# Patient Record
Sex: Male | Born: 1935 | Race: White | Hispanic: No | Smoking: Former smoker
Health system: Southern US, Community
[De-identification: ages and names within clinical notes are randomized; demographics above are authoritative.]

## PROBLEM LIST (undated history)

## (undated) DIAGNOSIS — I2510 Atherosclerotic heart disease of native coronary artery without angina pectoris: Secondary | ICD-10-CM

## (undated) DIAGNOSIS — N289 Disorder of kidney and ureter, unspecified: Secondary | ICD-10-CM

## (undated) DIAGNOSIS — G40909 Epilepsy, unspecified, not intractable, without status epilepticus: Secondary | ICD-10-CM

## (undated) DIAGNOSIS — K219 Gastro-esophageal reflux disease without esophagitis: Secondary | ICD-10-CM

## (undated) DIAGNOSIS — I219 Acute myocardial infarction, unspecified: Secondary | ICD-10-CM

## (undated) DIAGNOSIS — R55 Syncope and collapse: Secondary | ICD-10-CM

## (undated) DIAGNOSIS — I509 Heart failure, unspecified: Secondary | ICD-10-CM

## (undated) DIAGNOSIS — E785 Hyperlipidemia, unspecified: Secondary | ICD-10-CM

## (undated) DIAGNOSIS — J45909 Unspecified asthma, uncomplicated: Secondary | ICD-10-CM

## (undated) DIAGNOSIS — I714 Abdominal aortic aneurysm, without rupture, unspecified: Secondary | ICD-10-CM

## (undated) DIAGNOSIS — I10 Essential (primary) hypertension: Secondary | ICD-10-CM

## (undated) DIAGNOSIS — I495 Sick sinus syndrome: Secondary | ICD-10-CM

## (undated) DIAGNOSIS — I639 Cerebral infarction, unspecified: Secondary | ICD-10-CM

## (undated) DIAGNOSIS — R569 Unspecified convulsions: Secondary | ICD-10-CM

## (undated) DIAGNOSIS — C801 Malignant (primary) neoplasm, unspecified: Secondary | ICD-10-CM

## (undated) DIAGNOSIS — N184 Chronic kidney disease, stage 4 (severe): Secondary | ICD-10-CM

## (undated) DIAGNOSIS — J189 Pneumonia, unspecified organism: Secondary | ICD-10-CM

## (undated) HISTORY — DX: Syncope and collapse: R55

## (undated) HISTORY — PX: EYE SURGERY: SHX253

## (undated) HISTORY — PX: COLONOSCOPY: SHX174

## (undated) HISTORY — DX: Essential (primary) hypertension: I10

## (undated) HISTORY — DX: Hyperlipidemia, unspecified: E78.5

## (undated) HISTORY — DX: Epilepsy, unspecified, not intractable, without status epilepticus: G40.909

## (undated) HISTORY — DX: Atherosclerotic heart disease of native coronary artery without angina pectoris: I25.10

## (undated) HISTORY — DX: Abdominal aortic aneurysm, without rupture: I71.4

## (undated) HISTORY — PX: NOSE SURGERY: SHX723

## (undated) HISTORY — DX: Cerebral infarction, unspecified: I63.9

## (undated) HISTORY — DX: Abdominal aortic aneurysm, without rupture, unspecified: I71.40

---

## 2001-11-03 ENCOUNTER — Ambulatory Visit (HOSPITAL_COMMUNITY): Admission: RE | Admit: 2001-11-03 | Discharge: 2001-11-03 | Payer: Self-pay | Admitting: Internal Medicine

## 2002-05-11 ENCOUNTER — Ambulatory Visit (HOSPITAL_COMMUNITY): Admission: RE | Admit: 2002-05-11 | Discharge: 2002-05-11 | Payer: Self-pay | Admitting: Internal Medicine

## 2002-05-11 ENCOUNTER — Encounter (INDEPENDENT_AMBULATORY_CARE_PROVIDER_SITE_OTHER): Payer: Self-pay | Admitting: Internal Medicine

## 2002-06-03 ENCOUNTER — Ambulatory Visit (HOSPITAL_COMMUNITY): Admission: RE | Admit: 2002-06-03 | Discharge: 2002-06-03 | Payer: Self-pay | Admitting: Internal Medicine

## 2003-01-07 ENCOUNTER — Ambulatory Visit (HOSPITAL_COMMUNITY): Admission: RE | Admit: 2003-01-07 | Discharge: 2003-01-07 | Payer: Self-pay | Admitting: Internal Medicine

## 2003-03-16 ENCOUNTER — Ambulatory Visit (HOSPITAL_COMMUNITY): Admission: RE | Admit: 2003-03-16 | Discharge: 2003-03-16 | Payer: Self-pay | Admitting: Internal Medicine

## 2003-10-12 ENCOUNTER — Ambulatory Visit (HOSPITAL_COMMUNITY): Admission: RE | Admit: 2003-10-12 | Discharge: 2003-10-12 | Payer: Self-pay | Admitting: Internal Medicine

## 2003-10-25 ENCOUNTER — Encounter (HOSPITAL_COMMUNITY): Admission: RE | Admit: 2003-10-25 | Discharge: 2003-10-25 | Payer: Self-pay | Admitting: Internal Medicine

## 2003-11-04 ENCOUNTER — Ambulatory Visit (HOSPITAL_COMMUNITY): Admission: RE | Admit: 2003-11-04 | Discharge: 2003-11-04 | Payer: Self-pay | Admitting: Internal Medicine

## 2003-11-18 ENCOUNTER — Ambulatory Visit: Payer: Self-pay | Admitting: Internal Medicine

## 2004-04-21 ENCOUNTER — Ambulatory Visit (HOSPITAL_COMMUNITY): Admission: RE | Admit: 2004-04-21 | Discharge: 2004-04-21 | Payer: Self-pay | Admitting: Internal Medicine

## 2004-08-04 ENCOUNTER — Ambulatory Visit (HOSPITAL_COMMUNITY): Admission: RE | Admit: 2004-08-04 | Discharge: 2004-08-04 | Payer: Self-pay | Admitting: Internal Medicine

## 2004-08-09 ENCOUNTER — Encounter (HOSPITAL_COMMUNITY): Admission: RE | Admit: 2004-08-09 | Discharge: 2004-08-10 | Payer: Self-pay | Admitting: Internal Medicine

## 2004-08-14 ENCOUNTER — Ambulatory Visit: Payer: Self-pay | Admitting: *Deleted

## 2004-08-17 ENCOUNTER — Inpatient Hospital Stay (HOSPITAL_BASED_OUTPATIENT_CLINIC_OR_DEPARTMENT_OTHER): Admission: RE | Admit: 2004-08-17 | Discharge: 2004-08-17 | Payer: Self-pay | Admitting: *Deleted

## 2004-08-17 ENCOUNTER — Ambulatory Visit: Payer: Self-pay | Admitting: *Deleted

## 2004-08-23 ENCOUNTER — Inpatient Hospital Stay (HOSPITAL_COMMUNITY): Admission: RE | Admit: 2004-08-23 | Discharge: 2004-08-25 | Payer: Self-pay | Admitting: Cardiology

## 2004-08-23 HISTORY — PX: CARDIAC CATHETERIZATION: SHX172

## 2004-09-07 ENCOUNTER — Ambulatory Visit: Payer: Self-pay | Admitting: *Deleted

## 2004-09-07 ENCOUNTER — Ambulatory Visit: Payer: Self-pay | Admitting: Cardiology

## 2004-09-07 ENCOUNTER — Ambulatory Visit (HOSPITAL_COMMUNITY): Admission: RE | Admit: 2004-09-07 | Discharge: 2004-09-07 | Payer: Self-pay | Admitting: *Deleted

## 2005-03-19 ENCOUNTER — Ambulatory Visit (HOSPITAL_COMMUNITY): Admission: RE | Admit: 2005-03-19 | Discharge: 2005-03-19 | Payer: Self-pay | Admitting: Ophthalmology

## 2005-04-24 ENCOUNTER — Ambulatory Visit (HOSPITAL_COMMUNITY): Admission: RE | Admit: 2005-04-24 | Discharge: 2005-04-24 | Payer: Self-pay | Admitting: Internal Medicine

## 2005-08-23 ENCOUNTER — Ambulatory Visit (HOSPITAL_COMMUNITY): Admission: RE | Admit: 2005-08-23 | Discharge: 2005-08-23 | Payer: Self-pay | Admitting: Internal Medicine

## 2005-10-26 ENCOUNTER — Ambulatory Visit: Payer: Self-pay | Admitting: Cardiovascular Disease

## 2005-10-30 ENCOUNTER — Ambulatory Visit: Payer: Self-pay | Admitting: Cardiology

## 2005-10-30 ENCOUNTER — Encounter (HOSPITAL_COMMUNITY): Admission: RE | Admit: 2005-10-30 | Discharge: 2005-11-29 | Payer: Self-pay | Admitting: Cardiovascular Disease

## 2006-01-23 ENCOUNTER — Ambulatory Visit: Payer: Self-pay | Admitting: Cardiovascular Disease

## 2006-03-20 ENCOUNTER — Ambulatory Visit: Payer: Self-pay | Admitting: Internal Medicine

## 2006-03-26 ENCOUNTER — Ambulatory Visit: Payer: Self-pay | Admitting: Internal Medicine

## 2006-04-23 ENCOUNTER — Inpatient Hospital Stay (HOSPITAL_COMMUNITY): Admission: EM | Admit: 2006-04-23 | Discharge: 2006-04-25 | Payer: Self-pay | Admitting: Emergency Medicine

## 2006-05-16 ENCOUNTER — Ambulatory Visit: Payer: Self-pay | Admitting: Internal Medicine

## 2006-05-21 ENCOUNTER — Ambulatory Visit (HOSPITAL_COMMUNITY): Admission: RE | Admit: 2006-05-21 | Discharge: 2006-05-21 | Payer: Self-pay | Admitting: Internal Medicine

## 2006-08-21 ENCOUNTER — Ambulatory Visit (HOSPITAL_COMMUNITY): Admission: RE | Admit: 2006-08-21 | Discharge: 2006-08-21 | Payer: Self-pay | Admitting: Internal Medicine

## 2006-09-03 ENCOUNTER — Ambulatory Visit: Payer: Self-pay | Admitting: Cardiovascular Disease

## 2006-09-04 ENCOUNTER — Ambulatory Visit: Payer: Self-pay | Admitting: Orthopedic Surgery

## 2006-10-22 ENCOUNTER — Ambulatory Visit: Payer: Self-pay | Admitting: Cardiovascular Disease

## 2006-10-22 ENCOUNTER — Encounter (HOSPITAL_COMMUNITY): Admission: RE | Admit: 2006-10-22 | Discharge: 2006-11-21 | Payer: Self-pay | Admitting: Cardiovascular Disease

## 2006-11-01 ENCOUNTER — Ambulatory Visit: Payer: Self-pay | Admitting: Vascular Surgery

## 2007-04-17 ENCOUNTER — Ambulatory Visit: Payer: Self-pay | Admitting: Cardiovascular Disease

## 2007-07-25 ENCOUNTER — Ambulatory Visit (HOSPITAL_COMMUNITY): Admission: RE | Admit: 2007-07-25 | Discharge: 2007-07-25 | Payer: Self-pay | Admitting: Internal Medicine

## 2007-08-07 ENCOUNTER — Ambulatory Visit (HOSPITAL_COMMUNITY): Admission: RE | Admit: 2007-08-07 | Discharge: 2007-08-07 | Payer: Self-pay | Admitting: Internal Medicine

## 2007-08-12 ENCOUNTER — Ambulatory Visit (HOSPITAL_COMMUNITY): Admission: RE | Admit: 2007-08-12 | Discharge: 2007-08-12 | Payer: Self-pay | Admitting: Internal Medicine

## 2007-08-17 ENCOUNTER — Emergency Department (HOSPITAL_COMMUNITY): Admission: EM | Admit: 2007-08-17 | Discharge: 2007-08-17 | Payer: Self-pay | Admitting: Emergency Medicine

## 2007-08-19 ENCOUNTER — Ambulatory Visit: Payer: Self-pay | Admitting: Cardiology

## 2007-09-17 ENCOUNTER — Ambulatory Visit: Payer: Self-pay | Admitting: Cardiology

## 2007-10-24 ENCOUNTER — Ambulatory Visit: Payer: Self-pay | Admitting: Vascular Surgery

## 2008-03-03 ENCOUNTER — Ambulatory Visit (HOSPITAL_COMMUNITY): Admission: RE | Admit: 2008-03-03 | Discharge: 2008-03-03 | Payer: Self-pay | Admitting: Internal Medicine

## 2008-06-11 ENCOUNTER — Encounter: Payer: Self-pay | Admitting: Physician Assistant

## 2008-06-15 ENCOUNTER — Encounter: Payer: Self-pay | Admitting: Physician Assistant

## 2008-07-15 ENCOUNTER — Encounter: Payer: Self-pay | Admitting: Cardiology

## 2008-07-15 ENCOUNTER — Ambulatory Visit: Payer: Self-pay | Admitting: Cardiology

## 2008-10-22 ENCOUNTER — Ambulatory Visit: Payer: Self-pay | Admitting: Vascular Surgery

## 2008-10-29 ENCOUNTER — Encounter: Admission: RE | Admit: 2008-10-29 | Discharge: 2008-10-29 | Payer: Self-pay | Admitting: Neurology

## 2008-11-23 ENCOUNTER — Ambulatory Visit (HOSPITAL_COMMUNITY): Admission: RE | Admit: 2008-11-23 | Discharge: 2008-11-23 | Payer: Self-pay | Admitting: Internal Medicine

## 2008-11-30 ENCOUNTER — Encounter: Payer: Self-pay | Admitting: Critical Care Medicine

## 2008-11-30 ENCOUNTER — Ambulatory Visit (HOSPITAL_COMMUNITY): Admission: RE | Admit: 2008-11-30 | Discharge: 2008-11-30 | Payer: Self-pay | Admitting: Internal Medicine

## 2009-01-06 ENCOUNTER — Ambulatory Visit: Payer: Self-pay | Admitting: Critical Care Medicine

## 2009-01-06 DIAGNOSIS — I6350 Cerebral infarction due to unspecified occlusion or stenosis of unspecified cerebral artery: Secondary | ICD-10-CM | POA: Insufficient documentation

## 2009-01-06 DIAGNOSIS — J449 Chronic obstructive pulmonary disease, unspecified: Secondary | ICD-10-CM

## 2009-01-06 DIAGNOSIS — E785 Hyperlipidemia, unspecified: Secondary | ICD-10-CM

## 2009-01-06 DIAGNOSIS — J4489 Other specified chronic obstructive pulmonary disease: Secondary | ICD-10-CM | POA: Insufficient documentation

## 2009-01-06 DIAGNOSIS — M109 Gout, unspecified: Secondary | ICD-10-CM

## 2009-01-06 DIAGNOSIS — K219 Gastro-esophageal reflux disease without esophagitis: Secondary | ICD-10-CM

## 2009-01-06 DIAGNOSIS — I10 Essential (primary) hypertension: Secondary | ICD-10-CM | POA: Insufficient documentation

## 2009-01-06 DIAGNOSIS — J438 Other emphysema: Secondary | ICD-10-CM | POA: Insufficient documentation

## 2009-01-10 ENCOUNTER — Ambulatory Visit: Payer: Self-pay | Admitting: Cardiology

## 2009-01-10 ENCOUNTER — Encounter (INDEPENDENT_AMBULATORY_CARE_PROVIDER_SITE_OTHER): Payer: Self-pay

## 2009-01-10 DIAGNOSIS — I2510 Atherosclerotic heart disease of native coronary artery without angina pectoris: Secondary | ICD-10-CM

## 2009-01-10 DIAGNOSIS — R079 Chest pain, unspecified: Secondary | ICD-10-CM

## 2009-01-10 LAB — CONVERTED CEMR LAB
Albumin: 4.2 g/dL
BUN: 22 mg/dL
CO2: 27 meq/L
Cholesterol: 164 mg/dL
Glucose, Bld: 128 mg/dL
HCT: 41.6 %
HDL: 38 mg/dL
Hemoglobin: 13.7 g/dL
MCV: 96.7 fL
Platelets: 191 10*3/uL
Potassium: 4.8 meq/L
Sodium: 141 meq/L
Total Protein: 6.7 g/dL
Triglycerides: 125 mg/dL

## 2009-01-12 ENCOUNTER — Inpatient Hospital Stay (HOSPITAL_BASED_OUTPATIENT_CLINIC_OR_DEPARTMENT_OTHER): Admission: RE | Admit: 2009-01-12 | Discharge: 2009-01-12 | Payer: Self-pay | Admitting: Cardiology

## 2009-01-12 ENCOUNTER — Ambulatory Visit: Payer: Self-pay | Admitting: Cardiology

## 2009-01-17 ENCOUNTER — Encounter (INDEPENDENT_AMBULATORY_CARE_PROVIDER_SITE_OTHER): Payer: Self-pay | Admitting: *Deleted

## 2009-01-17 ENCOUNTER — Encounter: Payer: Self-pay | Admitting: Cardiology

## 2009-01-17 ENCOUNTER — Telehealth (INDEPENDENT_AMBULATORY_CARE_PROVIDER_SITE_OTHER): Payer: Self-pay | Admitting: *Deleted

## 2009-01-17 LAB — CONVERTED CEMR LAB
BUN: 18 mg/dL
BUN: 18 mg/dL
CO2: 22 meq/L
Chloride: 108 meq/L
Creatinine, Ser: 1.77 mg/dL
Glucose, Bld: 106 mg/dL
Glucose, Bld: 106 mg/dL
Potassium: 4.6 meq/L
Potassium: 4.6 meq/L
Sodium: 144 meq/L

## 2009-01-18 ENCOUNTER — Telehealth (INDEPENDENT_AMBULATORY_CARE_PROVIDER_SITE_OTHER): Payer: Self-pay

## 2009-01-18 ENCOUNTER — Encounter: Payer: Self-pay | Admitting: Cardiology

## 2009-01-18 LAB — CONVERTED CEMR LAB
CO2: 22 meq/L (ref 19–32)
Chloride: 108 meq/L (ref 96–112)
Potassium: 4.6 meq/L (ref 3.5–5.3)
Sodium: 144 meq/L (ref 135–145)

## 2009-01-21 ENCOUNTER — Encounter: Payer: Self-pay | Admitting: *Deleted

## 2009-01-24 ENCOUNTER — Encounter: Payer: Self-pay | Admitting: Adult Health

## 2009-01-24 ENCOUNTER — Ambulatory Visit: Payer: Self-pay | Admitting: Cardiology

## 2009-01-26 ENCOUNTER — Encounter: Payer: Self-pay | Admitting: Cardiology

## 2009-01-26 ENCOUNTER — Ambulatory Visit (HOSPITAL_COMMUNITY): Admission: RE | Admit: 2009-01-26 | Discharge: 2009-01-26 | Payer: Self-pay | Admitting: Cardiology

## 2009-01-26 ENCOUNTER — Ambulatory Visit: Payer: Self-pay | Admitting: Cardiology

## 2009-02-01 ENCOUNTER — Encounter (INDEPENDENT_AMBULATORY_CARE_PROVIDER_SITE_OTHER): Payer: Self-pay | Admitting: *Deleted

## 2009-02-01 LAB — CONVERTED CEMR LAB
BUN: 18 mg/dL
Chloride: 108 meq/L
Creatinine, Ser: 1.78 mg/dL

## 2009-02-02 ENCOUNTER — Encounter (INDEPENDENT_AMBULATORY_CARE_PROVIDER_SITE_OTHER): Payer: Self-pay | Admitting: *Deleted

## 2009-02-09 ENCOUNTER — Encounter (INDEPENDENT_AMBULATORY_CARE_PROVIDER_SITE_OTHER): Payer: Self-pay | Admitting: *Deleted

## 2009-02-09 ENCOUNTER — Ambulatory Visit: Payer: Self-pay | Admitting: Critical Care Medicine

## 2009-02-09 DIAGNOSIS — J310 Chronic rhinitis: Secondary | ICD-10-CM | POA: Insufficient documentation

## 2009-02-14 ENCOUNTER — Telehealth: Payer: Self-pay | Admitting: Critical Care Medicine

## 2009-02-23 ENCOUNTER — Ambulatory Visit: Payer: Self-pay | Admitting: Cardiology

## 2009-02-23 ENCOUNTER — Encounter (INDEPENDENT_AMBULATORY_CARE_PROVIDER_SITE_OTHER): Payer: Self-pay | Admitting: *Deleted

## 2009-03-18 ENCOUNTER — Telehealth: Payer: Self-pay | Admitting: Critical Care Medicine

## 2009-04-28 ENCOUNTER — Ambulatory Visit (HOSPITAL_COMMUNITY): Admission: RE | Admit: 2009-04-28 | Discharge: 2009-04-28 | Payer: Self-pay | Admitting: Internal Medicine

## 2009-06-10 ENCOUNTER — Ambulatory Visit: Payer: Self-pay | Admitting: Critical Care Medicine

## 2009-08-29 ENCOUNTER — Ambulatory Visit: Payer: Self-pay | Admitting: Cardiology

## 2009-08-29 ENCOUNTER — Telehealth (INDEPENDENT_AMBULATORY_CARE_PROVIDER_SITE_OTHER): Payer: Self-pay

## 2009-10-18 ENCOUNTER — Emergency Department (HOSPITAL_COMMUNITY): Admission: EM | Admit: 2009-10-18 | Discharge: 2009-10-18 | Payer: Self-pay | Admitting: Emergency Medicine

## 2009-12-12 ENCOUNTER — Ambulatory Visit: Payer: Self-pay | Admitting: Critical Care Medicine

## 2010-01-24 ENCOUNTER — Ambulatory Visit: Admit: 2010-01-24 | Payer: Self-pay | Admitting: Vascular Surgery

## 2010-01-24 ENCOUNTER — Ambulatory Visit
Admission: RE | Admit: 2010-01-24 | Discharge: 2010-01-24 | Payer: Self-pay | Source: Home / Self Care | Attending: Vascular Surgery | Admitting: Vascular Surgery

## 2010-02-05 ENCOUNTER — Encounter: Payer: Self-pay | Admitting: Neurology

## 2010-02-12 LAB — CONVERTED CEMR LAB
Calcium: 9.8 mg/dL (ref 8.4–10.5)
Hemoglobin: 12.9 g/dL — ABNORMAL LOW (ref 13.0–17.0)
Lymphs Abs: 1.2 10*3/uL (ref 0.7–4.0)
MCV: 92.4 fL (ref 78.0–100.0)
Monocytes Absolute: 0.8 10*3/uL (ref 0.1–1.0)
Monocytes Relative: 11 % (ref 3–12)
Neutro Abs: 5.3 10*3/uL (ref 1.7–7.7)
Neutrophils Relative %: 71 % (ref 43–77)
Prothrombin Time: 12.3 s (ref 11.6–15.2)
RBC: 4.28 M/uL (ref 4.22–5.81)
Sodium: 139 meq/L (ref 135–145)
WBC: 7.5 10*3/uL (ref 4.0–10.5)
aPTT: 29 s (ref 24–37)

## 2010-02-14 NOTE — Miscellaneous (Signed)
Summary: LABS BMP,.01/17/2009  Clinical Lists Changes  Observations: Added new observation of CALCIUM: 8.4 mg/dL (28/41/3244 0:10) Added new observation of CREATININE: 1.77 mg/dL (27/25/3664 4:03) Added new observation of BUN: 18 mg/dL (47/42/5956 3:87) Added new observation of BG RANDOM: 106 mg/dL (56/43/3295 1:88) Added new observation of CO2 PLSM/SER: 22 meq/L (01/17/2009 9:53) Added new observation of CL SERUM: 108 meq/L (01/17/2009 9:53) Added new observation of K SERUM: 4.6 meq/L (01/17/2009 9:53) Added new observation of NA: 144 meq/L (01/17/2009 9:53)

## 2010-02-14 NOTE — Progress Notes (Signed)
Summary: rx  Phone Note Call from Patient Call back at Home Phone [phone]   Caller: Patient Call For: wright Reason for Call: Talk to Nurse Summary of Call: pt says nasacort not as good as nasonex.  Pt still having bad dreams so wants to go back to Nasonex Spray.   Can you call this in? [facility] [place] Initial call taken by: Eugene Gavia,  March 18, 2009 10:41 AM  Follow-up for Phone Call        Pt had requested to change from nasonex due to bad dreams and restlessness that he states started after he changed to nasacort Pt states however that nasacort doe snot work as well as nasonex and is requesting to go back to Nasonex. Please advise if ok to swithc back. Carron Curie CMA  March 18, 2009 11:21 AM   Additional Follow-up for Phone Call Additional follow up Details #1::        i am ok wiht nasonex two sprays each nostril daily Additional Follow-up by: Storm Frisk MD,  March 18, 2009 1:30 PM    Additional Follow-up for Phone Call Additional follow up Details #2::    pt advised. rx sent. Carron Curie CMA  March 18, 2009 3:20 PM'   New/Updated Medications: NASONEX 50 MCG/ACT SUSP (MOMETASONE FUROATE) 2 sprays each nostril daily Prescriptions: NASONEX 50 MCG/ACT SUSP (MOMETASONE FUROATE) 2 sprays each nostril daily  #1 x 6   Entered by:   Carron Curie CMA   Authorized by:   Storm Frisk MD   Signed by:   Carron Curie CMA on 03/18/2009   Method used:   Electronically to        [facility] (retail)       [address]       [place], [place]  [address]       Ph: [phone]       Fax: [phone]   RxID:   [id number]

## 2010-02-14 NOTE — Miscellaneous (Signed)
Summary: LABS 01/17/09 BMP  Clinical Lists Changes  Observations: Added new observation of CALCIUM: 8.4 mg/dL (40/10/2723 36:64) Added new observation of CREATININE: 1.77 mg/dL (40/34/7425 95:63) Added new observation of BUN: 18 mg/dL (87/56/4332 95:18) Added new observation of BG RANDOM: 106 mg/dL (84/16/6063 01:60) Added new observation of CO2 PLSM/SER: 22 meq/L (01/17/2009 12:29) Added new observation of CL SERUM: 108 meq/L (01/17/2009 12:29) Added new observation of K SERUM: 4.6 meq/L (01/17/2009 12:29) Added new observation of NA: 144 meq/L (01/17/2009 12:29)

## 2010-02-14 NOTE — Progress Notes (Signed)
Summary: pt want to go over meds   Phone Note Call from Patient Call back at Home Phone [phone]   Caller: pt walk in Reason for Call: Talk to Nurse Summary of Call: pt was put on metoprolol by Dr Daleen Squibb and isosorbide by Joni Reining he just want to make sure this is correct to be taking both of them. Initial call taken by: Faythe Ghee,  August 29, 2009 11:33 AM  Follow-up for Phone Call        Pt. advised that it is OK to take both medications. Follow-up by: Larita Fife Via LPN,  August 29, 2009 2:42 PM

## 2010-02-14 NOTE — Letter (Signed)
Summary: [place] Results [facility], agricultural at [facility]  [address], [place] [address]   Phone: [phone]  Fax: [phone]      February 02, 2009 MRN: [id number]   [name] [address] [place], [place]  [address]   Dear [name],  Your test ordered by Selena Batten has been reviewed by your physician (or physician assistant) and was found to be normal or stable. Your physician (or physician assistant) felt no changes were needed at this time.  ____ Echocardiogram  ____ Cardiac Stress Test  __x__ Lab Work  ____ Peripheral vascular study of arms, legs or neck  ____ CT scan or X-ray  ____ Lung or Breathing test  ____ Other:  No change in medical treatment at this time,per  Dr. Diona Browner creatinine is stable.  Thank you, [name] RN    [place] Bing, MD, Lenise Arena.C.Gaylord Shih, MD, F.A.C.C Lewayne Bunting, MD, F.A.C.C Nona Dell, MD, F.A.C.C Charlton Haws, MD, Lenise Arena.C.C

## 2010-02-14 NOTE — Letter (Signed)
Summary: [place] [facility] Work Engineer, agricultural at [facility]  [address] [place] [address]   Phone: [phone]  Fax: [phone]     January 18, 2009 MRN: [id number]   [name] [address] [place], [place]  [address]      YOUR LAB WORK IS DUE  February 01, 2009 _________________________________________  Please go to [facility], located across the street from [facility] on the second floor.  Hours are Monday - Friday 7am until 7:30pm         Saturday 8am until 12noon    __  DO NOT EAT OR DRINK AFTER MIDNIGHT EVENING PRIOR TO LABWORK  _X_ YOUR LABWORK IS NOT FASTING --YOU MAY EAT PRIOR TO LABWORK

## 2010-02-14 NOTE — Progress Notes (Signed)
Summary: lab orders   Phone Note Other Incoming   Caller: [facility] Reason for Call: Discuss lab or test results Summary of Call: pt was seen this morning but they do not have order just went off pt cath discharge instructions. Please fax order. Initial call taken by: Faythe Ghee,  January 17, 2009 12:06 PM  Follow-up for Phone Call        orders sent from cath lab at discharge , pt found orders, no dx, I stated renal insufficency per last ov Follow-up by: Teressa Lower RN,  January 17, 2009 3:17 PM

## 2010-02-14 NOTE — Progress Notes (Signed)
Summary: reaction  Phone Note Call from Patient   Caller: Patient Call For: wright Summary of Call: nasonex is causing reaction would like somethingelse Initial call taken by: Rickard Patience,  February 14, 2009 11:02 AM  Follow-up for Phone Call        pt was started on nasonex at last ov on 02/09/09.  called, spoke with pt.  Pt states he is having crazy dreams, is restless, and is hot.  states these sxs did not start until he started nasonex-thinks he is having a reaction from it.  requesting nasonex to be changed.   Informed pt PW is out of office until tomorrow but will talk with doc of day about this-he is ok with this. Will forward message to doc of day-MW, please advise.  Thanks!  Follow-up by: Gweneth Dimitri RN,  February 14, 2009 11:13 AM  Additional Follow-up for Phone Call Additional follow up Details #1::        ok to change to nasacort, though I strongly doubt cause and effect Additional Follow-up by: Nyoka Cowden MD,  February 14, 2009 11:25 AM    Additional Follow-up for Phone Call Additional follow up Details #2::    pt advised. rx sent. Carron Curie CMA  February 14, 2009 11:34 AM   New/Updated Medications: NASACORT AQ 55 MCG/ACT AERS (TRIAMCINOLONE ACETONIDE(NASAL)) 2 puffs in each nostril daily Prescriptions: NASACORT AQ 55 MCG/ACT AERS (TRIAMCINOLONE ACETONIDE(NASAL)) 2 puffs in each nostril daily  #1 x 3   Entered by:   Carron Curie CMA   Authorized by:   Nyoka Cowden MD   Signed by:   Carron Curie CMA on 02/14/2009   Method used:   Electronically to        [facility] (retail)       [address]       [place], [place]  [address]       Ph: [phone]       Fax: [phone]   RxID:   [id number]   Appended Document: reaction i agree  pw

## 2010-02-14 NOTE — Assessment & Plan Note (Signed)
Summary: 2 wk f/u per checkuot on 01/24/09/tg      Allergies Added:   Visit Type:  Follow-up Referring Provider:  Dr. Shan Levans Primary Provider:  Dr. Carylon Perches   History of Present Illness: [name] is an anxious 77 y/o CM on follow-up for known history of CAD, Sever COPD, with most recent cardiac cath on 01/12/2009.  [name] had cardiac cath by Dr. Shirlee Latch revealing RCA totally occluded at the ostium with some left-right collaterals.  LM had 30% distal , Left CX revealed a small to moderate ramus, with about 60=70% ostial stenosis.  The circumflex itself had a long total occlusion and it extended from the ostium to just prior to the first OM.  This was the site of the prior placed stent..  The patient had good collateralls from the LAD to the distal Circ and 3 small to moderate OM field.  LAD had a 50%-^60% proximal stenosis just after the 1st diag.  The remainder of the LAD had luminal irriegularities. No LV gram was completed secondary to CKD.   The LAD stenosis was not flow limiting per Dr. Alford Highland observation.  Tx Medically.  On last visit he was continuing to have DOE and we ordered an echo as LV gram was not completed during above cath.  Also, his Imdur was incrased to 60mg  XL daily.  He states he is feeling better and has had an appointment with Dr. Sherene Sires pulmonoloigist for his dyspnea/cOPD.  He was started on Nasocort. He is here for discussion of echo results and follow-up evaluation of symptoms.  Echo revealed normal EF of 65%.  Mildly-Moderately  calcified AoV annulus with a minimal pericardial effusion identified.  No WMA, other valve were not well visulalized.  Please see full report for more details.  [name] states his is feeling some better and has been encouraged to begin exercise program with walking as he was doing in the past.    Preventive Screening-Counseling & Management  Alcohol-Tobacco     Alcohol drinks/day: 0     Smoking Status: quit  Current Medications  (verified): 1)  Simvastatin 40 Mg Tabs (Simvastatin) .... Take One Tablet By Mouth Daily At Bedtime 2)  Aspirin Ec 325 Mg Tbec (Aspirin) .... Take One Tablet By Mouth Daily 3)  Allopurinol 300 Mg Tabs (Allopurinol) .... Take 1 Tablet By Mouth Once A Day 4)  Cozaar 100 Mg Tabs (Losartan Potassium) .... Take 1 Tablet By Mouth Once Daily 5)  Plavix 75 Mg Tabs (Clopidogrel Bisulfate) .... Take One Tablet By Mouth Daily 6)  Oxybutynin Chloride 5 Mg Tabs (Oxybutynin Chloride) .... Take 1 Tablet By Mouth Two Times A Day 7)  Symbicort 160-4.5 Mcg/act  Aero (Budesonide-Formoterol Fumarate) .... Two Puffs Twice Daily 8)  Pepcid 20 Mg Tabs (Famotidine) .... One By Mouth Two Times A Day 9)  Isosorbide Mononitrate Cr 60 Mg Xr24h-Tab (Isosorbide Mononitrate) .... Take 1 Tablet By Mouth Once Daily 10)  Nitrostat 0.4 Mg Subl (Nitroglycerin) .Marland Kitchen.. 1 Tablet Under Tongue At Onset of Chest Pain; You May Repeat Every 5 Minutes For Up To 3 Doses. 11)  Nasacort Aq 55 Mcg/act Aers (Triamcinolone Acetonide(Nasal)) .... 2 Puffs in Each Nostril Daily  Allergies (verified): 1)  ! Penicillin V Potassium (Penicillin V Potassium) PMH-FH-SH reviewed-no changes except otherwise noted  Social History: Alcohol drinks/day:  0 Smoking Status:  quit  Review of Systems       The patient complains of chest pain and dyspnea on exertion.  All other systems have been reviewed and are negative unless stated above.   Vital Signs:  Patient profile:   77 year old male Weight:      186 pounds Pulse rate:   85 / minute BP sitting:   148 / 85  (right arm)  Vitals Entered By: Dreama Saa, CNA (February 23, 2009 11:13 AM)  Physical Exam  General:  Well developed, well nourished, in no acute distress. Lungs:  Diminshed with increased expiratory phase. Heart:  Non-displaced PMI, chest non-tender; regular rate and rhythm, S1, S2  1/6 systolic murmur, rubs or gallops.  Abdomen:  Bowel sounds positive; abdomen soft and  non-tender without masses, organomegaly, or hernias noted. No hepatosplenomegaly. Msk:  Back normal, normal gait. Muscle strength and tone normal. Psych:  anxious.     Impression & Recommendations:  Problem # 1:  CORONARY ATHEROSCLEROSIS NATIVE CORONARY ARTERY (ICD-414.01) Assessment Unchanged Continue to treat medically.  He is tolerating increased dose of Imdur at 60mg . His updated medication list for this problem includes:    Aspirin Ec 325 Mg Tbec (Aspirin) .Marland Kitchen... Take one tablet by mouth daily    Plavix 75 Mg Tabs (Clopidogrel bisulfate) .Marland Kitchen... Take one tablet by mouth daily    Isosorbide Mononitrate Cr 60 Mg Xr24h-tab (Isosorbide mononitrate) .Marland Kitchen... Take 1 tablet by mouth once daily    Nitrostat 0.4 Mg Subl (Nitroglycerin) .Marland Kitchen... 1 tablet under tongue at onset of chest pain; you may repeat every 5 minutes for up to 3 doses.  Problem # 2:  C O P D (ICD-496) Assessment: Unchanged  His updated medication list for this problem includes:    Symbicort 160-4.5 Mcg/act Aero (Budesonide-formoterol fumarate) .Marland Kitchen..Marland Kitchen Two puffs twice daily  Problem # 3:  HYPERTENSION (ICD-401.9) Assessment: Unchanged  His updated medication list for this problem includes:    Aspirin Ec 325 Mg Tbec (Aspirin) .Marland Kitchen... Take one tablet by mouth daily    Cozaar 100 Mg Tabs (Losartan potassium) .Marland Kitchen... Take 1 tablet by mouth once daily  Patient Instructions: 1)  Your physician recommends that you schedule a follow-up appointment in: 5 months 2)  Your physician recommends that you continue on your current medications as directed. Please refer to the Current Medication list given to you today.

## 2010-02-14 NOTE — Assessment & Plan Note (Signed)
Summary: 1 yr f/u per checkout on 07/15/08/tg  Medications Added METOPROLOL TARTRATE 25 MG TABS (METOPROLOL TARTRATE) take 1 tablet by mouth once daily      Allergies Added:   Visit Type:  Follow-up Referring Provider:  Dr. Shan Levans Primary Provider:  Dr. Carylon Perches  CC:  some chest pain .  History of Present Illness: [name] returns today for evaluation and management of his coronary disease. Please see the comprehensive note by Ms. [name] dated February 23, 2009.  He is having no angina. He had one episode of a sharp stabbing pain since last being seen. He did not take any nitroglycerin.  He denies orthopnea, PND. He does have some dependent edema at the end of the day particularly in the left lower extremity. He's had no pleuritic chest pain or soreness in his legs.  He's compliant with his medications.  Clinical Reports Reviewed:  Cardiac Cath:  08/23/2004: Cardiac Cath Findings:   COMPLICATIONS:  None.   FINDINGS:  1.  Right coronary artery moderate sized dominant vessel which is totally      occluded proximally, there are bridging collaterals.  Length of the      total occlusion is approximately 15 mm.  2.  Circumflex 99% stenosis reduced to less than 20%.  Flow improved from      TIMI-2 to TIMI-3.   IMPRESSION PLAN:  Successful bare metal stenting of the proximal circumflex.  There remains a non-flow limiting dissection distal to the stented region.  A stent could not be advanced across this segment.  The patient will,  therefore, be continued on aspirin indefinitely and Plavix for 90 days.  ReoPro was given as bail-out at the completion of the procedure.  He will be  observed for 48 hours prior to discharge.   WED/MEDQ  D:  08/23/2004  T:  08/23/2004  Job:  [id number]   cc:   [name], M.D.  Fax: [phone]   Current Medications (verified): 1)  Simvastatin 40 Mg Tabs (Simvastatin) .... Take One Tablet By Mouth Daily At Bedtime 2)  Aspirin Ec 325 Mg  Tbec (Aspirin) .... Take One Tablet By Mouth Daily 3)  Allopurinol 300 Mg Tabs (Allopurinol) .... Take 1 Tablet By Mouth Once A Day 4)  Cozaar 100 Mg Tabs (Losartan Potassium) .... Take 1 Tablet By Mouth Once Daily 5)  Plavix 75 Mg Tabs (Clopidogrel Bisulfate) .... Take One Tablet By Mouth Daily 6)  Oxybutynin Chloride 5 Mg Tabs (Oxybutynin Chloride) .... Take 1 Tablet By Mouth Two Times A Day 7)  Symbicort 160-4.5 Mcg/act  Aero (Budesonide-Formoterol Fumarate) .... Two Puffs Twice Daily 8)  Pepcid 20 Mg Tabs (Famotidine) .... One By Mouth Two Times A Day 9)  Isosorbide Mononitrate Cr 60 Mg Xr24h-Tab (Isosorbide Mononitrate) .... Take 1 Tablet By Mouth Once Daily 10)  Nitrostat 0.4 Mg Subl (Nitroglycerin) .Marland Kitchen.. 1 Tablet Under Tongue At Onset of Chest Pain; You May Repeat Every 5 Minutes For Up To 3 Doses. 11)  Nasonex 50 Mcg/act Susp (Mometasone Furoate) .... 2 Sprays Each Nostril Daily 12)  Proair Hfa 108 (90 Base) Mcg/act  Aers (Albuterol Sulfate) .Marland Kitchen.. 1-2 Puffs Every 4-6 Hours As Needed 13)  Metoprolol Tartrate 25 Mg Tabs (Metoprolol Tartrate) .... Take 1 Tablet By Mouth Once Daily  Allergies (verified): 1)  ! Penicillin V Potassium (Penicillin V Potassium)  Past History:  Past Medical History: Last updated: 01/06/2009 GOUT (ICD-274.9) STROKE (ICD-434.91)    -1993 ,  1995    -chronic  balance issues HYPERLIPIDEMIA (ICD-272.4) EMPHYSEMA/COPD (ICD-492.8) HYPERTENSION (ICD-401.9) CAD (ICD-414.00)    -stent    2006  Past Surgical History: Last updated: 07/13/2008 cath (08/23/2004) bare metal stenting of the proximal circumflex  Family History: Last updated: 01/06/2009 emphysema-brother heart disease-sister rhuematism-mother, father lung ca-brother throat ca-brother  Social History: Last updated: 01/06/2009 Patient states former smoker.  quit in 1992.  <1/2ppd x 42yrs widowed  2 children lives alone Retired from [employer]  Risk Factors: Alcohol Use: 0  (02/23/2009)  Risk Factors: Smoking Status: quit > 6 months (06/10/2009)  Review of Systems       negative other than history of present illness  Vital Signs:  Patient profile:   77 year old male Weight:      190 pounds Pulse rate:   101 / minute BP sitting:   125 / 76  (right arm)  Vitals Entered By: Dreama Saa, CNA (August 29, 2009 8:36 AM)  Physical Exam  General:  no acute distress, looks young for his stated age Head:  normocephalic and atraumatic Eyes:  PERRLA/EOM intact; conjunctiva and lids normal. Neck:  Neck supple, no JVD. No masses, thyromegaly or abnormal cervical nodes. Chest Dewane Timson:  no deformities or breast masses noted Lungs:  no rhonchi or wheezes Heart:  nondisplaced PMI, soft S1-S2, no gallop or murmur. Carotids are full without bruits Abdomen:  Bowel sounds positive; abdomen soft and non-tender without masses, organomegaly, or hernias noted. No hepatosplenomegaly. Msk:  decreased ROM.   Pulses:  pulses normal in all 4 extremities Extremities:  trace right pedal edema.   Neurologic:  Alert and oriented x 3. Skin:  Intact without lesions or rashes. Psych:  Normal affect.   EKG  Procedure date:  08/29/2009  Findings:      normal sinus rhythm, first-degree A-V block, left anterior fascicular block, no acute changes  Impression & Recommendations:  Problem # 1:  CORONARY ATHEROSCLEROSIS NATIVE CORONARY ARTERY (ICD-414.01) I will add low-dose beta blocker metoprolol succinate to slow his heart rate down below 70. No other changes. Nitroglycerin renewed. His updated medication list for this problem includes:    Aspirin Ec 325 Mg Tbec (Aspirin) .Marland Kitchen... Take one tablet by mouth daily    Plavix 75 Mg Tabs (Clopidogrel bisulfate) .Marland Kitchen... Take one tablet by mouth daily    Isosorbide Mononitrate Cr 60 Mg Xr24h-tab (Isosorbide mononitrate) .Marland Kitchen... Take 1 tablet by mouth once daily    Nitrostat 0.4 Mg Subl (Nitroglycerin) .Marland Kitchen... 1 tablet under tongue at onset of  chest pain; you may repeat every 5 minutes for up to 3 doses.    Metoprolol Tartrate 25 Mg Tabs (Metoprolol tartrate) .Marland Kitchen... Take 1 tablet by mouth once daily  Problem # 2:  CHEST PAIN (ICD-786.50) Assessment: New This is noncardiac. Patient reassured His updated medication list for this problem includes:    Aspirin Ec 325 Mg Tbec (Aspirin) .Marland Kitchen... Take one tablet by mouth daily    Plavix 75 Mg Tabs (Clopidogrel bisulfate) .Marland Kitchen... Take one tablet by mouth daily    Isosorbide Mononitrate Cr 60 Mg Xr24h-tab (Isosorbide mononitrate) .Marland Kitchen... Take 1 tablet by mouth once daily    Nitrostat 0.4 Mg Subl (Nitroglycerin) .Marland Kitchen... 1 tablet under tongue at onset of chest pain; you may repeat every 5 minutes for up to 3 doses.    Metoprolol Tartrate 25 Mg Tabs (Metoprolol tartrate) .Marland Kitchen... Take 1 tablet by mouth once daily  Orders: EKG w/ Interpretation (93000)  Problem # 3:  C O P D (ICD-496) He has  no wheezing or rhonchi on exam and does not report any symptoms of bronchospasm. I think he'll tolerate a low dose of beta blocker. His updated medication list for this problem includes:    Symbicort 160-4.5 Mcg/act Aero (Budesonide-formoterol fumarate) .Marland Kitchen..Marland Kitchen Two puffs twice daily    Proair Hfa 108 (90 Base) Mcg/act Aers (Albuterol sulfate) .Marland Kitchen... 1-2 puffs every 4-6 hours as needed  Problem # 4:  HYPERLIPIDEMIA (ICD-272.4)  His updated medication list for this problem includes:    Simvastatin 40 Mg Tabs (Simvastatin) .Marland Kitchen... Take one tablet by mouth daily at bedtime  Problem # 5:  HYPERTENSION (ICD-401.9) Assessment: Improved  His updated medication list for this problem includes:    Aspirin Ec 325 Mg Tbec (Aspirin) .Marland Kitchen... Take one tablet by mouth daily    Cozaar 100 Mg Tabs (Losartan potassium) .Marland Kitchen... Take 1 tablet by mouth once daily    Metoprolol Tartrate 25 Mg Tabs (Metoprolol tartrate) .Marland Kitchen... Take 1 tablet by mouth once daily  Patient Instructions: 1)  Your physician recommends that you schedule a follow-up  appointment in: 1 year 2)  Your physician has recommended you make the following change in your medication: start taking Metoprolol 25mg  by mouth once daily  Prescriptions: METOPROLOL TARTRATE 25 MG TABS (METOPROLOL TARTRATE) take 1 tablet by mouth once daily  #30 x 11   Entered by:   Larita Fife Via LPN   Authorized by:   Gaylord Shih, MD, [facility]   Signed by:   Larita Fife Via LPN on 16/10/9602   Method used:   Electronically to        [facility] (retail)       [address]       [place], [place]  [address]       Ph: [phone]       Fax: [phone]   RxID:   [id number]

## 2010-02-14 NOTE — Progress Notes (Signed)
**Note De-Identified [name] Obfuscation** Summary: Lab results/Hyzaar  Medications Added SIMVASTATIN 40 MG TABS (SIMVASTATIN) Take one tablet by mouth daily at bedtime ASPIRIN EC 325 MG TBEC (ASPIRIN) Take one tablet by mouth daily ALLOPURINOL 300 MG TABS (ALLOPURINOL) Take 1 tablet by mouth once a day HYZAAR 100-25 MG TABS (LOSARTAN POTASSIUM-HCTZ) Take 1 tablet by mouth once a day COZAAR 100 MG TABS (LOSARTAN POTASSIUM) take 1 tablet by mouth once daily PLAVIX 75 MG TABS (CLOPIDOGREL BISULFATE) Take one tablet by mouth daily OXYBUTYNIN CHLORIDE 5 MG TABS (OXYBUTYNIN CHLORIDE) Take 1 tablet by mouth two times a day SYMBICORT 160-4.5 MCG/ACT  AERO (BUDESONIDE-FORMOTEROL FUMARATE) Two puffs twice daily PEPCID 20 MG TABS (FAMOTIDINE) One by mouth two times a day       Phone Note Outgoing Call   Reason for Call: Discuss lab or test results Details for Reason: Lab results Summary of Call: Patient given lab results and he expressed understanding. Also, he wants to know when he should start taking Hyzaar again. Initial call taken by: Larita Fife Nashonda Limberg LPN,  January 18, 2009 9:02 AM  Follow-up for Phone Call        Would suggest he start Cozaar 100 mg once daily instead of Hyzaar - this would remove HCTZ.  Needs BMET in 2 weeks. Follow-up by: Loreli Slot, MD, [facility],  January 18, 2009 10:19 AM  Additional Follow-up for Phone Call Additional follow up Details #1::        Patient advised and states he understands info. given. Lab orders mailed to patient today.    New/Updated Medications: COZAAR 100 MG TABS (LOSARTAN POTASSIUM) take 1 tablet by mouth once daily Prescriptions: COZAAR 100 MG TABS (LOSARTAN POTASSIUM) take 1 tablet by mouth once daily  #30 x 6   Entered by:   Larita Fife Carr Shartzer LPN   Authorized by:   Loreli Slot, MD, [facility]   Signed by:   Larita Fife Kemiah Booz LPN on 04/54/0981   Method used:   Electronically to        [facility] (retail)       [address] [place],  [place]  [address]       Ph: [phone]       Fax: [phone]   RxID:   [id number]

## 2010-02-14 NOTE — Assessment & Plan Note (Signed)
Summary: POST CATH PER MARK @ [facility]  Medications Added ISOSORBIDE MONONITRATE CR 30 MG XR24H-TAB (ISOSORBIDE MONONITRATE) take 1 tab daily ISOSORBIDE MONONITRATE CR 60 MG XR24H-TAB (ISOSORBIDE MONONITRATE) Take 1 tablet by mouth once daily NITROSTAT 0.4 MG SUBL (NITROGLYCERIN) 1 tablet under tongue at onset of chest pain; you may repeat every 5 minutes for up to 3 doses.      Allergies Added:   Visit Type:  Follow-up Referring Provider:  Dr. Shan Levans Primary Provider:  Dr. Carylon Perches   History of Present Illness: [name] is a 77 y/o CM we are seeing after having a cardiac cath on 01/12/2009.  He had this done at the request of Dr. Diona Browner with a known history of CAD, with stent to the CX artery. He had progressive dyspnea over the last 6 months and was seen by Dr. Shan Levans as well.  He was diagnosed with severe COPD based on PFT studies in his office. It was recommended by Dr. Delford Field that the DOE was multifactorial and a cardiac cath was suggested.  [name] had cardiac cath by Dr. Shirlee Latch revealing RCA totally occluded at the ostium with some left-right collaterals.  LM had 30% distal , Left CX revealed a small to moderate ramus, with about 60=70% ostial stenosis.  The circumflex itself had a long total occlusion and it extended from the ostium to just prior to the first OM.  This was the site of the prior placed stent..  The patient had good collateralls from the LAD to the distal Circ and 3 small to moderate OM field.  LAD had a 50%-^60% proximal stenosis just after the 1st diag.  The remainder of the LAD had luminal irriegularities. No LV gram was completed secondary to CKD.   The LAD stenosis was not flow limiting per Dr. Alford Highland observation.  Tx Medically.  [name] continues to have DOE and chest discomfort.  The breathing is his main concern and complaint.  He has had episodes of depression since having the cardiac cath, according to his daughter who accompanies  him.  He is normally very active and has been limited secondary to his breathing.  He is frustrated that his heart and lung status have deteriorated.  Current Problems (verified): 1)  Coronary Atherosclerosis Native Coronary Artery  (ICD-414.01) 2)  Preoperative Examination  (ICD-V72.84) 3)  Chest Pain  (ICD-786.50) 4)  Gerd  (ICD-530.81) 5)  C O P D  (ICD-496) 6)  Gout  (ICD-274.9) 7)  Stroke  (ICD-434.91) 8)  Hyperlipidemia  (ICD-272.4) 9)  Emphysema  (ICD-492.8) 10)  Hypertension  (ICD-401.9)  Current Medications (verified): 1)  Simvastatin 40 Mg Tabs (Simvastatin) .... Take One Tablet By Mouth Daily At Bedtime 2)  Aspirin Ec 325 Mg Tbec (Aspirin) .... Take One Tablet By Mouth Daily 3)  Allopurinol 300 Mg Tabs (Allopurinol) .... Take 1 Tablet By Mouth Once A Day 4)  Cozaar 100 Mg Tabs (Losartan Potassium) .... Take 1 Tablet By Mouth Once Daily 5)  Plavix 75 Mg Tabs (Clopidogrel Bisulfate) .... Take One Tablet By Mouth Daily 6)  Oxybutynin Chloride 5 Mg Tabs (Oxybutynin Chloride) .... Take 1 Tablet By Mouth Two Times A Day 7)  Symbicort 160-4.5 Mcg/act  Aero (Budesonide-Formoterol Fumarate) .... Two Puffs Twice Daily 8)  Pepcid 20 Mg Tabs (Famotidine) .... One By Mouth Two Times A Day 9)  Isosorbide Mononitrate Cr 60 Mg Xr24h-Tab (Isosorbide Mononitrate) .... Take 1 Tablet By Mouth Once Daily 10)  Nitrostat 0.4  Mg Subl (Nitroglycerin) .Marland Kitchen.. 1 Tablet Under Tongue At Onset of Chest Pain; You May Repeat Every 5 Minutes For Up To 3 Doses.  Allergies (verified): 1)  ! Penicillin V Potassium (Penicillin V Potassium) PMH-FH-SH reviewed-no changes except otherwise noted  Review of Systems       All other systems have been reviewed and are negative unless stated above.   Vital Signs:  Patient profile:   77 year old male Weight:      192 pounds Pulse rate:   71 / minute BP sitting:   141 / 78  (right arm)  Vitals Entered By: Dreama Saa, CNA (January 24, 2009 1:51 PM)  Physical  Exam  General:  Well developed, well nourished, in no acute distress. Lungs:  Bilateral crackles without wheezes. No pain with inspiration. Heart:  RRR without MRG. Distant HS. Abdomen:  Bowel sounds positive; abdomen soft and non-tender without masses, organomegaly, or hernias noted. No hepatosplenomegaly. Msk:  Back normal, normal gait. Muscle strength and tone normal. Pulses:  pulses normal in all 4 extremities Extremities:  No clubbing or cyanosis. Neurologic:  Alert and oriented x 3. Psych:  depressed affect.     Impression & Recommendations:  Problem # 1:  CORONARY ATHEROSCLEROSIS NATIVE CORONARY ARTERY (ICD-414.01) [name] continues to have exertional chest pain.  No intervention per Dr. Shirlee Latch to LAD as it is not flow limiting. We will continue to treat him medically.  I will titrate up his isosorbide to 60mg  daily.  I have spoken extensively with [name] and with his daughter who is a Charity fundraiser. Gone over the cath report and the reasons we will continue to treat him medically.  He remains skeptical that something more could be done,.  We will follow him with an ECHO to evaluate LV fx in the setting of ischemia as no LV gram was completed during cath.  This will assist will cardiac mgt. His updated medication list for this problem includes:    Aspirin Ec 325 Mg Tbec (Aspirin) .Marland Kitchen... Take one tablet by mouth daily    Plavix 75 Mg Tabs (Clopidogrel bisulfate) .Marland Kitchen... Take one tablet by mouth daily    Isosorbide Mononitrate Cr 60 Mg Xr24h-tab (Isosorbide mononitrate) .Marland Kitchen... Take 1 tablet by mouth once daily    Nitrostat 0.4 Mg Subl (Nitroglycerin) .Marland Kitchen... 1 tablet under tongue at onset of chest pain; you may repeat every 5 minutes for up to 3 doses.  Problem # 2:  C O P D (ICD-496) I have reviewed Dr. Florene Route office note.  He states that he has severe COPD. Will leave further lung studies and mgt to Dr. Delford Field.  I have advised him that secondary to his lung status, he will have chronic DOE and to  not over do his activities, allowing time for rest with exertion. His updated medication list for this problem includes:    Symbicort 160-4.5 Mcg/act Aero (Budesonide-formoterol fumarate) .Marland Kitchen..Marland Kitchen Two puffs twice daily  Other Orders: 2-D Echocardiogram (2D Echo)  Patient Instructions: 1)  Your physician recommends that you schedule a follow-up appointment in: 2 weeks 2)  Your physician has recommended you make the following change in your medication: Increase Isosorbide to 60mg  by mouth once daily. 3)  Your physician has requested that you have an echocardiogram.  Echocardiography is a painless test that uses sound waves to create images of your heart. It provides your doctor with information about the size and shape of your heart and how well your heart's chambers and valves are  working.  This procedure takes approximately one hour. There are no restrictions for this procedure. Prescriptions: NITROSTAT 0.4 MG SUBL (NITROGLYCERIN) 1 tablet under tongue at onset of chest pain; you may repeat every 5 minutes for up to 3 doses.  #25 x 3   Entered by:   Larita Fife Via LPN   Authorized by:   Joni Reining, NP   Signed by:   Larita Fife Via LPN on 16/10/9602   Method used:   Electronically to        [facility] (retail)       [address]       [place], [place]  [address]       Ph: [phone]       Fax: [phone]   RxID:   [id number] ISOSORBIDE MONONITRATE CR 60 MG XR24H-TAB (ISOSORBIDE MONONITRATE) Take 1 tablet by mouth once daily  #30 x 6   Entered by:   Larita Fife Via LPN   Authorized by:   Joni Reining, NP   Signed by:   Larita Fife Via LPN on 11/11/2534   Method used:   Electronically to        [facility] (retail)       [address] [place], [place]  [address]       Ph: [phone]       Fax: [phone]   RxID:   [id number]

## 2010-02-14 NOTE — Assessment & Plan Note (Signed)
Summary: Pulmonary OV   Copy to:  Dr. Shan Levans Primary Provider/Referring Provider:  Dr. Carylon Perches  CC:  2 mo follow up.  states breathing is the same-no better and no worse.  c/o productive cough with clear to brown mucus-states he has a "scratching" sensation in back of throat that brings the cough on..  History of Present Illness: Pulmonary OV       This is an 77 year old male with COPD primary emphysema component.    February 09, 2009 9:58 AM Stays choked up with drainage in throat and still irritated.  if take water will go away.  Is still dyspneic with exertion.  Worse if make the bed.  No real improvement overall.   Preventive Screening-Counseling & Management  Alcohol-Tobacco     Smoking Status: quit > 6 months  Current Medications (verified): 1)  Simvastatin 40 Mg Tabs (Simvastatin) .... Take One Tablet By Mouth Daily At Bedtime 2)  Aspirin Ec 325 Mg Tbec (Aspirin) .... Take One Tablet By Mouth Daily 3)  Allopurinol 300 Mg Tabs (Allopurinol) .... Take 1 Tablet By Mouth Once A Day 4)  Cozaar 100 Mg Tabs (Losartan Potassium) .... Take 1 Tablet By Mouth Once Daily 5)  Plavix 75 Mg Tabs (Clopidogrel Bisulfate) .... Take One Tablet By Mouth Daily 6)  Oxybutynin Chloride 5 Mg Tabs (Oxybutynin Chloride) .... Take 1 Tablet By Mouth Two Times A Day 7)  Symbicort 160-4.5 Mcg/act  Aero (Budesonide-Formoterol Fumarate) .... Two Puffs Twice Daily 8)  Pepcid 20 Mg Tabs (Famotidine) .... One By Mouth Two Times A Day 9)  Isosorbide Mononitrate Cr 60 Mg Xr24h-Tab (Isosorbide Mononitrate) .... Take 1 Tablet By Mouth Once Daily 10)  Nitrostat 0.4 Mg Subl (Nitroglycerin) .Marland Kitchen.. 1 Tablet Under Tongue At Onset of Chest Pain; You May Repeat Every 5 Minutes For Up To 3 Doses.  Allergies (verified): 1)  ! Penicillin V Potassium (Penicillin V Potassium)  Past History:  Past medical, surgical, family and social histories (including risk factors) reviewed for relevance to current acute and  chronic problems.  Past Medical History: Reviewed history from 01/06/2009 and no changes required. GOUT (ICD-274.9) STROKE (ICD-434.91)    -1993 ,  1995    -chronic balance issues HYPERLIPIDEMIA (ICD-272.4) EMPHYSEMA/COPD (ICD-492.8) HYPERTENSION (ICD-401.9) CAD (ICD-414.00)    -stent    2006  Past Surgical History: Reviewed history from 07/13/2008 and no changes required. cath (08/23/2004) bare metal stenting of the proximal circumflex  Family History: Reviewed history from 01/06/2009 and no changes required. emphysema-brother heart disease-sister rhuematism-mother, father lung ca-brother throat ca-brother  Social History: Reviewed history from 01/06/2009 and no changes required. Patient states former smoker.  quit in 1992.  <1/2ppd x 80yrs widowed  2 children lives alone Retired from [employer] 1998  Smoking Status:  quit > 6 months  Review of Systems       The patient complains of shortness of breath with activity.  The patient denies shortness of breath at rest, productive cough, non-productive cough, coughing up blood, chest pain, irregular heartbeats, acid heartburn, indigestion, loss of appetite, weight change, abdominal pain, difficulty swallowing, sore throat, tooth/dental problems, headaches, nasal congestion/difficulty breathing through nose, sneezing, itching, ear ache, anxiety, depression, hand/feet swelling, joint stiffness or pain, rash, change in color of mucus, and fever.    Vital Signs:  Patient profile:   77 year old male Height:      70 inches Weight:      191 pounds O2 Sat:  95 % on Room air Temp:     97.3 degrees F oral Pulse rate:   96 / minute BP sitting:   108 / 62  (right arm) Cuff size:   regular  Vitals Entered By: Gweneth Dimitri RN (February 09, 2009 9:43 AM)  O2 Flow:  Room air CC: 2 mo follow up.  states breathing is the same-no better, no worse.  c/o productive cough with clear to brown mucus-states he has a "scratching" sensation  in back of throat that brings the cough on. Comments Medications reviewed with patient Daytime contact number verified with patient. Gweneth Dimitri RN  February 09, 2009 9:47 AM    Physical Exam  Additional Exam:  Gen: Pleasant, well-nourished, in no distress,  normal affect ENT: No lesions,  mouth clear,  oropharynx clear, no postnasal drip Neck: No JVD, no TMG, no carotid bruits Lungs: No use of accessory muscles, no dullness to percussion, clear without rales or rhonchi, distant BS Cardiovascular: RRR, heart sounds normal, no murmur or gallops, no peripheral edema Abdomen: soft and NT, no HSM,  BS normal Musculoskeletal: No deformities, no cyanosis or clubbing Neuro: alert, non focal Skin: Warm, no lesions or rashes    Impression & Recommendations:  Problem # 1:  C O P D (ICD-496) Assessment Unchanged Pt with ongoing COPD but dyspnea out of proportion to PFTs.  COPD stable and is NOT a contraindication to cardiac revascularization if that is indicated, ongoing chronic rhiniiitis is noted plan Use saline nasal spray each nostril twice daily Use nasonex two sprays each nostril daily Stay on symbicort twice daily I will contact cardiology regarding your lung condition Return 4 months  Medications Added to Medication List This Visit: 1)  Nasonex 50 Mcg/act Susp (Mometasone furoate) .... Two puffs each nostril daily  Complete Medication List: 1)  Simvastatin 40 Mg Tabs (Simvastatin) .... Take one tablet by mouth daily at bedtime 2)  Aspirin Ec 325 Mg Tbec (Aspirin) .... Take one tablet by mouth daily 3)  Allopurinol 300 Mg Tabs (Allopurinol) .... Take 1 tablet by mouth once a day 4)  Cozaar 100 Mg Tabs (Losartan potassium) .... Take 1 tablet by mouth once daily 5)  Plavix 75 Mg Tabs (Clopidogrel bisulfate) .... Take one tablet by mouth daily 6)  Oxybutynin Chloride 5 Mg Tabs (Oxybutynin chloride) .... Take 1 tablet by mouth two times a day 7)  Symbicort 160-4.5 Mcg/act Aero  (Budesonide-formoterol fumarate) .... Two puffs twice daily 8)  Pepcid 20 Mg Tabs (Famotidine) .... One by mouth two times a day 9)  Isosorbide Mononitrate Cr 60 Mg Xr24h-tab (Isosorbide mononitrate) .... Take 1 tablet by mouth once daily 10)  Nitrostat 0.4 Mg Subl (Nitroglycerin) .Marland Kitchen.. 1 tablet under tongue at onset of chest pain; you may repeat every 5 minutes for up to 3 doses. 11)  Nasonex 50 Mcg/act Susp (Mometasone furoate) .... Two puffs each nostril daily  Other Orders: Est. Patient Level IV (21308)  Patient Instructions: 1)  Use saline nasal spray each nostril twice daily 2)  Use nasonex two sprays each nostril daily 3)  Stay on symbicort twice daily 4)  I will contact cardiology regarding your lung condition 5)  Return 4 months Prescriptions: SYMBICORT 160-4.5 MCG/ACT  AERO (BUDESONIDE-FORMOTEROL FUMARATE) Two puffs twice daily  #1 x 3   Entered by:   Gweneth Dimitri RN   Authorized by:   Storm Frisk MD   Signed by:   Gweneth Dimitri RN on 02/09/2009   Method used:  Electronically to        [facility] (retail)       [address]       [place], [place]  [address]       Ph: [phone]       Fax: [phone]   RxID:   [id number] NASONEX 50 MCG/ACT  SUSP (MOMETASONE FUROATE) Two puffs each nostril daily  #1 x 6   Entered and Authorized by:   Storm Frisk MD   Signed by:   Storm Frisk MD on 02/09/2009   Method used:   Print then Give to Patient   RxID:   [id number]    Immunization History:  Influenza Immunization History:    Influenza:  historical (09/15/2008)

## 2010-02-14 NOTE — Assessment & Plan Note (Signed)
Summary: Pulmonary OV   Copy to:  Dr. Shan Levans Primary Provider/Referring Provider:  Dr. Carylon Perches  CC:  6 month follow up, denies chest tighness, prod cough creamy white , and sob worse in am.  History of Present Illness: Pulmonary OV       This is an 77 year old male with COPD primary emphysema component.    February 09, 2009 9:58 AM Stays choked up with drainage in throat and still irritated.  if take water will go away.  Is still dyspneic with exertion.  Worse if make the bed.  No real improvement overall.   Jun 10, 2009 9:48 AM Doing the same.  Not much cough.  Still with pndrip and choke up in throat.  Still has dyspnea with exertion.   No heartburn.  There is no chest pain. Now is coughing up some blood mixed with mucous.   December 12, 2009 1:56 PM Not doing well.   symptoms now:  Had prior cva in 1995, after that would feel like he would pass out.  Still having cough and mucus production .  Pt is still dyspneic.  Still with episodes . Neuro MD rx lamitrignine 250mg  -500mg  two times a day.  No further spells since that time.  Mucus is creamy white.  If does much work will give out.   No real chest pain.     Preventive Screening-Counseling & Management  Alcohol-Tobacco     Alcohol drinks/day: 0     Smoking Status: quit > 6 months     Packs/Day: 0.5     Year Quit: 1992     Pack years: 25  Current Medications (verified): 1)  Simvastatin 40 Mg Tabs (Simvastatin) .... Take One Tablet By Mouth Daily At Bedtime 2)  Aspirin Ec 325 Mg Tbec (Aspirin) .... Take One Tablet By Mouth Daily 3)  Allopurinol 300 Mg Tabs (Allopurinol) .... Take 1 Tablet By Mouth Once A Day 4)  Cozaar 100 Mg Tabs (Losartan Potassium) .... Take 1 Tablet By Mouth Once Daily 5)  Plavix 75 Mg Tabs (Clopidogrel Bisulfate) .... Take One Tablet By Mouth Daily 6)  Oxybutynin Chloride 5 Mg Tabs (Oxybutynin Chloride) .... Take 1 Tablet By Mouth Two Times A Day 7)  Symbicort 160-4.5 Mcg/act  Aero  (Budesonide-Formoterol Fumarate) .... Two Puffs Twice Daily 8)  Pepcid 20 Mg Tabs (Famotidine) .... One By Mouth Two Times A Day 9)  Isosorbide Mononitrate Cr 60 Mg Xr24h-Tab (Isosorbide Mononitrate) .... Take 1 Tablet By Mouth Once Daily 10)  Nitrostat 0.4 Mg Subl (Nitroglycerin) .Marland Kitchen.. 1 Tablet Under Tongue At Onset of Chest Pain; You May Repeat Every 5 Minutes For Up To 3 Doses. 11)  Nasonex 50 Mcg/act Susp (Mometasone Furoate) .... 2 Sprays Each Nostril Daily 12)  Proair Hfa 108 (90 Base) Mcg/act  Aers (Albuterol Sulfate) .Marland Kitchen.. 1-2 Puffs Every 4-6 Hours As Needed 13)  Metoprolol Tartrate 25 Mg Tabs (Metoprolol Tartrate) .... Take 1 Tablet By Mouth Once Daily 14)  Keppra 250 Mg Tabs (Levetiracetam) .... 2 Tablets Two Times A Day  Allergies (verified): 1)  ! Penicillin V Potassium (Penicillin V Potassium)  Past History:  Past medical, surgical, family and social histories (including risk factors) reviewed, and no changes noted (except as noted below).  Past Medical History: Reviewed history from 01/06/2009 and no changes required. GOUT (ICD-274.9) STROKE (ICD-434.91)    -1993 ,  1995    -chronic balance issues HYPERLIPIDEMIA (ICD-272.4) EMPHYSEMA/COPD (ICD-492.8) HYPERTENSION (ICD-401.9) CAD (ICD-414.00)    -  stent    2006  Past Surgical History: Reviewed history from 07/13/2008 and no changes required. cath (08/23/2004) bare metal stenting of the proximal circumflex  Family History: Reviewed history from 01/06/2009 and no changes required. emphysema-brother heart disease-sister rhuematism-mother, father lung ca-brother throat ca-brother  Social History: Reviewed history from 01/06/2009 and no changes required. Patient states former smoker.  quit in 1992.  <1/2ppd x 81yrs widowed  2 children lives alone Retired from [employer] 1998  Packs/Day:  0.5  Review of Systems       The patient complains of shortness of breath with activity.  The patient denies shortness of  breath at rest, productive cough, non-productive cough, coughing up blood, chest pain, irregular heartbeats, acid heartburn, indigestion, loss of appetite, weight change, abdominal pain, difficulty swallowing, sore throat, tooth/dental problems, headaches, nasal congestion/difficulty breathing through nose, sneezing, itching, ear ache, anxiety, depression, hand/feet swelling, joint stiffness or pain, rash, change in color of mucus, and fever.    Vital Signs:  Patient profile:   77 year old male Height:      72 inches Weight:      193.4 pounds BMI:     26.32 O2 Sat:      96 % on Room air Temp:     97.7 degrees F oral Pulse rate:   82 / minute BP sitting:   120 / 64  (left arm)  Vitals Entered By: Renold Genta RCP, LPN (December 12, 2009 1:39 PM)  O2 Flow:  Room air CC: 6 month follow up, denies chest tighness, prod cough creamy white , sob worse in am Comments Medications reviewed with patient Renold Genta RCP, LPN  December 12, 2009 1:46 PM    Immunization History:  Influenza Immunization History:    Influenza:  fluvax 3+ (11/02/2009)   Physical Exam  Additional Exam:  Gen: Pleasant, well-nourished, in no distress,  normal affect ENT: No lesions,  mouth clear,  oropharynx clear, no postnasal drip Neck: No JVD, no TMG, no carotid bruits Lungs: No use of accessory muscles, no dullness to percussion, clear without rales or rhonchi, distant BS Cardiovascular: RRR, heart sounds normal, no murmur or gallops, no peripheral edema Abdomen: soft and NT, no HSM,  BS normal Musculoskeletal: No deformities, no cyanosis or clubbing Neuro: alert, non focal Skin: Warm, no lesions or rashes    Impression & Recommendations:  Problem # 1:  C O P D (ICD-496) Assessment Unchanged  stable copd plan No change in inhaled medications.   Maintain treatment program as currently prescribed.  Medications Added to Medication List This Visit: 1)  Keppra 250 Mg Tabs (Levetiracetam) .... 2  tablets two times a day  Complete Medication List: 1)  Simvastatin 40 Mg Tabs (Simvastatin) .... Take one tablet by mouth daily at bedtime 2)  Aspirin Ec 325 Mg Tbec (Aspirin) .... Take one tablet by mouth daily 3)  Allopurinol 300 Mg Tabs (Allopurinol) .... Take 1 tablet by mouth once a day 4)  Cozaar 100 Mg Tabs (Losartan potassium) .... Take 1 tablet by mouth once daily 5)  Plavix 75 Mg Tabs (Clopidogrel bisulfate) .... Take one tablet by mouth daily 6)  Oxybutynin Chloride 5 Mg Tabs (Oxybutynin chloride) .... Take 1 tablet by mouth two times a day 7)  Symbicort 160-4.5 Mcg/act Aero (Budesonide-formoterol fumarate) .... Two puffs twice daily 8)  Pepcid 20 Mg Tabs (Famotidine) .... One by mouth two times a day 9)  Isosorbide Mononitrate Cr 60 Mg Xr24h-tab (Isosorbide mononitrate) .... Take 1  tablet by mouth once daily 10)  Nitrostat 0.4 Mg Subl (Nitroglycerin) .Marland Kitchen.. 1 tablet under tongue at onset of chest pain; you may repeat every 5 minutes for up to 3 doses. 11)  Nasonex 50 Mcg/act Susp (Mometasone furoate) .... 2 sprays each nostril daily 12)  Proair Hfa 108 (90 Base) Mcg/act Aers (Albuterol sulfate) .Marland Kitchen.. 1-2 puffs every 4-6 hours as needed 13)  Metoprolol Tartrate 25 Mg Tabs (Metoprolol tartrate) .... Take 1 tablet by mouth once daily 14)  Keppra 250 Mg Tabs (Levetiracetam) .... 2 tablets two times a day  Other Orders: Est. Patient Level III ([address])  Patient Instructions: 1)  No change in medications 2)  Return in    3      months  Prevention & Chronic Care Immunizations   Influenza vaccine: Fluvax 3+  (11/02/2009)    Tetanus booster: Not documented    Pneumococcal vaccine: Not documented    H. zoster vaccine: Not documented  Colorectal Screening   Hemoccult: Not documented    Colonoscopy: Not documented  Other Screening   PSA: Not documented   Smoking status: quit > 6 months  (12/12/2009)  Lipids   Total Cholesterol: 164  (01/10/2009)   LDL: 101  (01/10/2009)    LDL Direct: Not documented   HDL: 38  (01/10/2009)   Triglycerides: 125  (01/10/2009)    SGOT (AST): 17  (01/10/2009)   SGPT (ALT): 16  (01/10/2009)   Alkaline phosphatase: 45  (01/10/2009)   Total bilirubin: Not documented  Hypertension   Last Blood Pressure: 120 / 64  (12/12/2009)   Serum creatinine: 1.78  (02/01/2009)   Serum potassium 4.3  (02/01/2009)  Self-Management Support :    Hypertension self-management support: Not documented    Lipid self-management support: Not documented

## 2010-02-14 NOTE — Miscellaneous (Signed)
Summary: bmp per Dr. Ouida Sills  Clinical Lists Changes  Observations: Added new observation of CALCIUM: 9.1 mg/dL (16/10/9602 54:09) Added new observation of CREATININE: 1.78 mg/dL (81/19/1478 29:56) Added new observation of BUN: 18 mg/dL (21/30/8657 84:69) Added new observation of BG RANDOM: 95 mg/dL (62/95/2841 32:44) Added new observation of CO2 PLSM/SER: 24 meq/L (02/01/2009 12:58) Added new observation of CL SERUM: 108 meq/L (02/01/2009 12:58) Added new observation of K SERUM: 4.3 meq/L (02/01/2009 12:58) Added new observation of NA: 144 meq/L (02/01/2009 12:58)  Appended Document: bmp per Dr. Ouida Sills Creatinine stable.

## 2010-02-14 NOTE — Miscellaneous (Signed)
Summary: labs bmp,01/17/2009  Clinical Lists Changes  Observations: Added new observation of CALCIUM: 8.4 mg/dL (16/10/9602 54:09) Added new observation of CREATININE: 1.77 mg/dL (81/19/1478 29:56) Added new observation of BUN: 18 mg/dL (21/30/8657 84:69) Added new observation of BG RANDOM: 106 mg/dL (62/95/2841 32:44) Added new observation of CO2 PLSM/SER: 22 meq/L (01/17/2009 16:39) Added new observation of CL SERUM: 108 meq/L (01/17/2009 16:39) Added new observation of K SERUM: 4.6 meq/L (01/17/2009 16:39) Added new observation of NA: 144 meq/L (01/17/2009 16:39)

## 2010-02-14 NOTE — Assessment & Plan Note (Signed)
Summary: Pulmonary OV   Copy to:  Dr. Shan Levans Primary Provider/Referring Provider:  Dr. Carylon Perches  CC:  4 month followup.  Pt states his breathing is the same- no better or worse.  He states that he has been coughing more than usual- normally non prod but sometimes produces minimal white sputum sometimes streaked with blood.  He also c/o hoarsness.Marland Kitchen  History of Present Illness: Pulmonary OV       This is an 77 year old male with COPD primary emphysema component.    February 09, 2009 9:58 AM Stays choked up with drainage in throat and still irritated.  if take water will go away.  Is still dyspneic with exertion.  Worse if make the bed.  No real improvement overall.   Jun 10, 2009 9:48 AM Doing the same.  Not much cough.  Still with pndrip and choke up in throat.  Still has dyspnea with exertion.   No heartburn.  There is no chest pain. Now is coughing up some blood mixed with mucous.  Preventive Screening-Counseling & Management  Alcohol-Tobacco     Smoking Status: quit > 6 months  Current Medications (verified): 1)  Simvastatin 40 Mg Tabs (Simvastatin) .... Take One Tablet By Mouth Daily At Bedtime 2)  Aspirin Ec 325 Mg Tbec (Aspirin) .... Take One Tablet By Mouth Daily 3)  Allopurinol 300 Mg Tabs (Allopurinol) .... Take 1 Tablet By Mouth Once A Day 4)  Cozaar 100 Mg Tabs (Losartan Potassium) .... Take 1 Tablet By Mouth Once Daily 5)  Plavix 75 Mg Tabs (Clopidogrel Bisulfate) .... Take One Tablet By Mouth Daily 6)  Oxybutynin Chloride 5 Mg Tabs (Oxybutynin Chloride) .... Take 1 Tablet By Mouth Two Times A Day 7)  Symbicort 160-4.5 Mcg/act  Aero (Budesonide-Formoterol Fumarate) .... Two Puffs Twice Daily 8)  Pepcid 20 Mg Tabs (Famotidine) .... One By Mouth Two Times A Day 9)  Isosorbide Mononitrate Cr 60 Mg Xr24h-Tab (Isosorbide Mononitrate) .... Take 1 Tablet By Mouth Once Daily 10)  Nitrostat 0.4 Mg Subl (Nitroglycerin) .Marland Kitchen.. 1 Tablet Under Tongue At Onset of Chest Pain;  You May Repeat Every 5 Minutes For Up To 3 Doses. 11)  Nasonex 50 Mcg/act Susp (Mometasone Furoate) .... 2 Sprays Each Nostril Daily  Allergies (verified): 1)  ! Penicillin V Potassium (Penicillin V Potassium)  Past History:  Past medical, surgical, family and social histories (including risk factors) reviewed, and no changes noted (except as noted below).  Past Medical History: Reviewed history from 01/06/2009 and no changes required. GOUT (ICD-274.9) STROKE (ICD-434.91)    -1993 ,  1995    -chronic balance issues HYPERLIPIDEMIA (ICD-272.4) EMPHYSEMA/COPD (ICD-492.8) HYPERTENSION (ICD-401.9) CAD (ICD-414.00)    -stent    2006  Past Surgical History: Reviewed history from 07/13/2008 and no changes required. cath (08/23/2004) bare metal stenting of the proximal circumflex  Family History: Reviewed history from 01/06/2009 and no changes required. emphysema-brother heart disease-sister rhuematism-mother, father lung ca-brother throat ca-brother  Social History: Reviewed history from 01/06/2009 and no changes required. Patient states former smoker.  quit in 1992.  <1/2ppd x 35yrs widowed  2 children lives alone Retired from [employer] 1998  Smoking Status:  quit > 6 months  Review of Systems       The patient complains of shortness of breath with activity.  The patient denies shortness of breath at rest, productive cough, non-productive cough, coughing up blood, chest pain, irregular heartbeats, acid heartburn, indigestion, loss of appetite, weight change, abdominal pain,  difficulty swallowing, sore throat, tooth/dental problems, headaches, nasal congestion/difficulty breathing through nose, sneezing, itching, ear ache, anxiety, depression, hand/feet swelling, joint stiffness or pain, rash, change in color of mucus, and fever.         There is hemoptysis.   Vital Signs:  Patient profile:   77 year old male Weight:      189 pounds BMI:     27.22 O2 Sat:      92 % on  Room air Temp:     97.4 degrees F oral Pulse rate:   75 / minute BP sitting:   122 / 70  (left arm)  Vitals Entered By: Vernie Murders (Jun 10, 2009 9:33 AM)  O2 Flow:  Room air  Physical Exam  Additional Exam:  Gen: Pleasant, well-nourished, in no distress,  normal affect ENT: No lesions,  mouth clear,  oropharynx clear, no postnasal drip Neck: No JVD, no TMG, no carotid bruits Lungs: No use of accessory muscles, no dullness to percussion, clear without rales or rhonchi, distant BS Cardiovascular: RRR, heart sounds normal, no murmur or gallops, no peripheral edema Abdomen: soft and NT, no HSM,  BS normal Musculoskeletal: No deformities, no cyanosis or clubbing Neuro: alert, non focal Skin: Warm, no lesions or rashes    Impression & Recommendations:  Problem # 1:  C O P D (ICD-496) Assessment Unchanged stable golds stage II copd plan No change in inhaled medications.   Maintain treatment program as currently prescribed.  Medications Added to Medication List This Visit: 1)  Proair Hfa 108 (90 Base) Mcg/act Aers (Albuterol sulfate) .Marland Kitchen.. 1-2 puffs every 4-6 hours as needed  Complete Medication List: 1)  Simvastatin 40 Mg Tabs (Simvastatin) .... Take one tablet by mouth daily at bedtime 2)  Aspirin Ec 325 Mg Tbec (Aspirin) .... Take one tablet by mouth daily 3)  Allopurinol 300 Mg Tabs (Allopurinol) .... Take 1 tablet by mouth once a day 4)  Cozaar 100 Mg Tabs (Losartan potassium) .... Take 1 tablet by mouth once daily 5)  Plavix 75 Mg Tabs (Clopidogrel bisulfate) .... Take one tablet by mouth daily 6)  Oxybutynin Chloride 5 Mg Tabs (Oxybutynin chloride) .... Take 1 tablet by mouth two times a day 7)  Symbicort 160-4.5 Mcg/act Aero (Budesonide-formoterol fumarate) .... Two puffs twice daily 8)  Pepcid 20 Mg Tabs (Famotidine) .... One by mouth two times a day 9)  Isosorbide Mononitrate Cr 60 Mg Xr24h-tab (Isosorbide mononitrate) .... Take 1 tablet by mouth once daily 10)   Nitrostat 0.4 Mg Subl (Nitroglycerin) .Marland Kitchen.. 1 tablet under tongue at onset of chest pain; you may repeat every 5 minutes for up to 3 doses. 11)  Nasonex 50 Mcg/act Susp (Mometasone furoate) .... 2 sprays each nostril daily 12)  Proair Hfa 108 (90 Base) Mcg/act Aers (Albuterol sulfate) .Marland Kitchen.. 1-2 puffs every 4-6 hours as needed  Other Orders: Est. Patient Level III (98119)  Patient Instructions: 1)  Refills sent to pharmacy 2)  No change in medications 3)  Return 6 months Prescriptions: PEPCID 20 MG TABS (FAMOTIDINE) One by mouth two times a day  #60 x 6   Entered and Authorized by:   Storm Frisk MD   Signed by:   Storm Frisk MD on 06/10/2009   Method used:   Electronically to        [facility]* (retail)       [address]  [place], [place]  [address]       Ph: [phone]       Fax: [phone]   RxID:   [id number] NASONEX 50 MCG/ACT SUSP (MOMETASONE FUROATE) 2 sprays each nostril daily  #1 x 6   Entered and Authorized by:   Storm Frisk MD   Signed by:   Storm Frisk MD on 06/10/2009   Method used:   Electronically to        [facility] (retail)       [address] [place], [place]  [address]       Ph: [phone]       Fax: [phone]   RxID:   [id number] SYMBICORT 160-4.5 MCG/ACT  AERO (BUDESONIDE-FORMOTEROL FUMARATE) Two puffs twice daily  #1 x 6   Entered and Authorized by:   Storm Frisk MD   Signed by:   Storm Frisk MD on 06/10/2009   Method used:   Electronically to        [facility] (retail)       [address] [place], [place]  [address]       Ph: [phone]       Fax: [phone]   RxID:   [id number]   Appended Document: Pulmonary OV fax roy fagan

## 2010-03-29 LAB — BASIC METABOLIC PANEL
Calcium: 9.3 mg/dL (ref 8.4–10.5)
Chloride: 104 mEq/L (ref 96–112)
Creatinine, Ser: 1.63 mg/dL — ABNORMAL HIGH (ref 0.4–1.5)
GFR calc Af Amer: 49 mL/min — ABNORMAL LOW (ref >60)

## 2010-03-29 LAB — DIFFERENTIAL
Lymphocytes Relative: 21 % (ref 12–46)
Lymphs Abs: 1.1 10*3/uL (ref 0.7–4.0)
Neutro Abs: 3.5 10*3/uL (ref 1.7–7.7)
Neutrophils Relative %: 67 % (ref 43–77)

## 2010-03-29 LAB — URINALYSIS, ROUTINE W REFLEX MICROSCOPIC
Bilirubin Urine: NEGATIVE
Glucose, UA: NEGATIVE mg/dL
Hgb urine dipstick: NEGATIVE
Ketones, ur: NEGATIVE mg/dL
Nitrite: NEGATIVE
Protein, ur: NEGATIVE mg/dL
Specific Gravity, Urine: 1.01 (ref 1.005–1.030)
Urobilinogen, UA: 0.2 mg/dL (ref 0.0–1.0)
pH: 6 (ref 5.0–8.0)

## 2010-03-29 LAB — POCT CARDIAC MARKERS
CKMB, poc: <1 ng/mL — ABNORMAL LOW (ref 1.0–8.0)
Myoglobin, poc: 146 ng/mL (ref 12–200)
Myoglobin, poc: 147 ng/mL (ref 12–200)
Troponin i, poc: <0.05 ng/mL (ref 0.00–0.09)
Troponin i, poc: <0.05 ng/mL (ref 0.00–0.09)

## 2010-03-29 LAB — CBC
Platelets: 180 10*3/uL (ref 150–400)
RBC: 4.3 MIL/uL (ref 4.22–5.81)
WBC: 5.3 10*3/uL (ref 4.0–10.5)

## 2010-04-10 ENCOUNTER — Encounter: Payer: Self-pay | Admitting: Critical Care Medicine

## 2010-04-11 ENCOUNTER — Encounter: Payer: Self-pay | Admitting: Critical Care Medicine

## 2010-04-11 ENCOUNTER — Ambulatory Visit (INDEPENDENT_AMBULATORY_CARE_PROVIDER_SITE_OTHER): Payer: Medicare Other | Admitting: Critical Care Medicine

## 2010-04-11 VITALS — BP 126/62 | HR 61 | Temp 96.6°F | Ht 71.5 in | Wt 192.2 lb

## 2010-04-11 DIAGNOSIS — J449 Chronic obstructive pulmonary disease, unspecified: Secondary | ICD-10-CM

## 2010-04-11 MED ORDER — TIOTROPIUM BROMIDE MONOHYDRATE 18 MCG IN CAPS: 18.0000 ug | ORAL_CAPSULE | Freq: Every day | RESPIRATORY_TRACT | Status: DC

## 2010-04-11 NOTE — Progress Notes (Signed)
Subjective:    Patient ID: [name], male    DOB: 21-Apr-1921, 77 y.o.   MRN: [id number]  Shortness of Breath This is a chronic problem. The current episode started more than 1 year ago. The problem occurs daily (exertion only). The problem has been unchanged. Associated symptoms include chest pain, leg swelling, rhinorrhea, sputum production and wheezing. Pertinent negatives include no hemoptysis, sore throat or syncope. Associated symptoms comments: Chest pain is dull, comes and goes, occ if moves around.. His past medical history is significant for COPD.  Cough This is a chronic problem. The current episode started more than 1 year ago. The problem has been unchanged. The problem occurs every few hours. The cough is productive of sputum. Associated symptoms include chest pain, nasal congestion, postnasal drip, rhinorrhea, shortness of breath and wheezing. Pertinent negatives include no heartburn, hemoptysis or sore throat. His past medical history is significant for COPD.   04/11/2010 F/u COPD primary emphysema Syncope d/t seizures from CVA.  No further pass out spells since seizure meds adjusted.  Pt with dyspnea and cough as above Pt stopped symbicort d/t throat irritation,  No evidence it helped anyway.  Last seen 11/11.   Review of Systems  HENT: Positive for rhinorrhea and postnasal drip. Negative for sore throat.   Respiratory: Positive for cough, sputum production, shortness of breath and wheezing. Negative for hemoptysis.   Cardiovascular: Positive for chest pain and leg swelling. Negative for syncope.  Gastrointestinal: Negative for heartburn.       Objective:   Physical Exam Gen: Pleasant, well-nourished, in no distress,  normal affect  ENT: No lesions,  mouth clear,  oropharynx clear, no postnasal drip  Neck: No JVD, no TMG, no carotid bruits  Lungs: No use of accessory muscles, no dullness to percussion, distant BS Cardiovascular: RRR, heart sounds normal, no murmur  or gallops, no peripheral edema  Abdomen: soft and NT, no HSM,  BS normal  Musculoskeletal: No deformities, no cyanosis or clubbing  Neuro: alert, non focal  Skin: Warm, no lesions or rashes    No images are attached to the encounter.         Assessment & Plan:  C O P D Failed symbicort Plan Trial Spiriva daily Stop symbicort    Outpatient Encounter Prescriptions as of 04/11/2010  Medication Sig Dispense Refill  . albuterol (PROAIR HFA) 108 (90 BASE) MCG/ACT inhaler 1-2 puffs every 4-6 hours as needed       . allopurinol (ZYLOPRIM) 300 MG tablet Take 300 mg by mouth daily.        Marland Kitchen aspirin 325 MG tablet Take 325 mg by mouth daily.        . clopidogrel (PLAVIX) 75 MG tablet Take 75 mg by mouth 2 (two) times daily.       . famotidine (PEPCID) 20 MG tablet Take 20 mg by mouth 2 (two) times daily.        . isosorbide mononitrate (IMDUR) 60 MG 24 hr tablet Take 60 mg by mouth daily.       Marland Kitchen losartan (COZAAR) 100 MG tablet Take 100 mg by mouth daily.        . metoprolol tartrate (LOPRESSOR) 25 MG tablet Take 25 mg by mouth daily.        . mometasone (NASONEX) 50 MCG/ACT nasal spray 2 sprays by Nasal route daily.        . nitroGLYCERIN (NITROSTAT) 0.4 MG SL tablet 1 tablet under tongue at onset of chest pain;you  may repeat every 5 minutes for up to 3 doses.       . Oxcarbazepine (TRILEPTAL) 300 MG tablet 1 tablet in am and 1 1/2 tablets at bedtime       . oxybutynin (DITROPAN) 5 MG tablet Take 5 mg by mouth 2 (two) times daily.       . simvastatin (ZOCOR) 40 MG tablet Take 40 mg by mouth at bedtime.        Marland Kitchen tiotropium (SPIRIVA) 18 MCG inhalation capsule Place 1 capsule (18 mcg total) into inhaler and inhale daily.  30 capsule  12  . DISCONTD: budesonide-formoterol (SYMBICORT) 160-4.5 MCG/ACT inhaler Inhale 2 puffs into the lungs 2 (two) times daily.        Marland Kitchen DISCONTD: levETIRAcetam (KEPPRA) 250 MG tablet 1 tablet in am and 1 1/2 at bedtime

## 2010-04-11 NOTE — Patient Instructions (Signed)
Stop symbicort Start Spiriva Use proair as needed Return 3 months

## 2010-04-11 NOTE — Assessment & Plan Note (Signed)
Failed symbicort Plan Trial Spiriva daily Stop symbicort

## 2010-04-17 LAB — BASIC METABOLIC PANEL
CO2: 26 mEq/L (ref 19–32)
Calcium: 8.8 mg/dL (ref 8.4–10.5)
Creatinine, Ser: 1.91 mg/dL — ABNORMAL HIGH (ref 0.4–1.5)
GFR calc Af Amer: 41 mL/min — ABNORMAL LOW (ref >60)
GFR calc non Af Amer: 34 mL/min — ABNORMAL LOW (ref >60)
Sodium: 138 mEq/L (ref 135–145)

## 2010-04-19 LAB — BLOOD GAS, ARTERIAL
Patient temperature: 37
pCO2 arterial: 39 mmHg (ref 35.0–45.0)
pH, Arterial: 7.42 (ref 7.350–7.450)

## 2010-05-27 ENCOUNTER — Other Ambulatory Visit: Payer: Self-pay | Admitting: Cardiology

## 2010-05-30 NOTE — Assessment & Plan Note (Signed)
[facility]                       [place] [facility] OFFICE NOTE   NAME:[name], JOVANNE RIGGENBACH                        MRN:          [id number]  DATE:04/17/2007                            DOB:          [birth year]    Dyon returns today for followup.  He has a history of previous MI with  an EF 43%, a stent to the circ in 2006.   This is a bare metal stent.   CORONARY RISK FACTORS:  Include hyperlipidemia and hypertension.   The patient has been compliant with his meds.  He is not having  significant chest pain.  The patient is still extremely active.  In  fact, Jhamari used to be the [employer] for 27  years.  He is still a very stout person.  His activity is limited by  gout in his feet, but he has not any significant chest pain.  He needs  new nitroglycerin the bottle again with him today is years old.  In  talking to the patient he denies any significant chest pain.  His last  Myoview in October 2008 was abnormal but low-risk.  He had inferior  posterior MI with no ischemia and an EF of 47%.   REVIEW OF SYSTEMS:  Remarkable for some postural lightheadedness.  When  he bends over he seems to get lightheaded.  I explained to him that I  thought this was due to his age and he really should not be doing those  sorts of maneuvers as the autonomic reflexes are slowed with age.  I do  not think it represents any significant arrhythmia.  He has had a  previous CVA and may be more prone to dizziness with squatting and  change in position.   His meds include Prilosec 20 a day, allopurinol 300 a day, Ditropan 10  mg a day, Hyzaar 125, Plavix 75 a day, multivitamins, aspirin a day,  Lescol 80 a day.   EXAM:  Remarkable for healthy-appearing octogenarian, to be 86 soon.  Affect is appropriate.  Weight is 184, blood pressure is 110/60, no postural symptoms, pulse 66  and regular, respiratory rate 14.  HEENT:  Unremarkable.  Carotids are  without bruit.  No lymphadenopathy, thyromegaly, JVP  elevation.  LUNGS:  Clear diaphragmatic motion.  No wheezing.  S1-S2, distant heart sounds.  PMI normal.  ABDOMEN:  Benign bowel sounds positive.  The abdominal aorta is  palpable.  He has a known aneurysm that is being followed by Dr. Arbie Cookey.  The patient's abdomen is otherwise benign with no bruit.  No tenderness.  No hepatosplenomegaly.  No hepatojugular reflux.  Distal pulses are intact with no edema.  NEURO:  Nonfocal.  SKIN:  Warm and dry.  No muscular weakness.   Last abdominal ultrasound done by Dr. Bosie Helper office in October 2008  showed maximum dimension of his aneurysm to be 3.8x3.7.   IMPRESSION:  1. Coronary disease, previous lateral myocardial infarction with stent      to the circumflex.  Follow up Myoview in October.  Refill for      nitroglycerin given.  Continue aspirin therapy.  2. Hypertension.  Currently well-controlled.  Continue Hyzaar and low-      salt diet.  3. Postural dizziness.  I do not think this is significant in regards      to his coronary disease or arrhythmia.  Avoid postural changes.      Stay hydrated.  Consider cutting back on diuretic in the future.  4. Hypercholesteremia in the setting of coronary disease.  Continue      Lescol 80 a day.  5. Gout.  Continue allopurinol, colchicine p.r.n. for flares.  It      primarily involves his feet.  6. Abdominal aortic aneurysm.  Follow up abdominal duplex by Dr. Arbie Cookey      in October.  I will see Larin when he has a stress test in October      2.     Noralyn Pick. Eden Emms, MD, [facility]  Electronically Signed    PCN/MedQ  DD: 04/17/2007  DT: 04/17/2007  Job #: [id number]

## 2010-05-30 NOTE — Assessment & Plan Note (Signed)
OFFICE VISIT   [name], [name]  DOB:  06/18/1921                                       01/24/2010  BJYNW#:[id number]   The patient presents today for continued followup of his infrarenal  abdominal aortic aneurysm.  He has no symptoms referable to this.  He  continues to have multiple medical difficulties, has reported some  seizures post stroke and does have history of significant coronary  artery disease with prior stenting.   FAMILY HISTORY:  Positive for aneurysm in his brother which I repaired  years ago.  He was older and passed away approximately 3 years ago.   SOCIAL HISTORY:  He is retired, widowed with 2 children.  He quit  smoking a couple years ago.  Does not drink alcohol.   PHYSICAL EXAM:  General:  Well-developed, well-nourished white male  appearing younger than stated age of 65.  Vital signs:  Blood pressure  is 175/85, pulse 68, respirations 20.  HEENT:  Normal.  Abdomen:  Soft,  nontender.  He has a prominent aortic pulsation and no tenderness over  this.  Musculoskeletal:  Shows no major deformities or cyanosis.  Neurological:  No focal weakness or paresthesias.  Skin:  Without ulcers  or rashes.   He underwent ultrasound today which I have ordered and independently  reviewed and discussed with the patient.  This shows no significant  growth of infrarenal aneurysm, maximal diameter just over 4 cm.  I have  recommended that we continue with a yearly ultrasound to rule out any  progression in size.  He knows to notify us should he develop any  difficulties in the interim.     [name], M.D.  Electronically Signed   TFE/MEDQ  D:  01/24/2010  T:  01/24/2010  Job:  [id number]   cc:   [name], MD

## 2010-05-30 NOTE — Procedures (Signed)
DUPLEX ULTRASOUND OF ABDOMINAL AORTA   INDICATION:  Follow up abdominal aortic aneurysm.   HISTORY:  Diabetes:  No.  Cardiac:  Coronary artery disease and stent.  Hypertension:  Yes.  Smoking:  Quit in 1991.  Connective Tissue Disorder:  Family History:  Previous Surgery:   DUPLEX EXAM:         AP (cm)                   TRANSVERSE (cm)  Proximal             2.66 cm                   2.45 cm  Mid                  3.50 cm                   3.65 cm  Distal               3.40 cm                   3.84 cm  Right Iliac          1.33 cm                   1.65 cm  Left Iliac           1.31 cm                   1.41 cm   PREVIOUS:  Date:  AP:  3.76  TRANSVERSE:  3.69   IMPRESSION:  Bilobar abdominal aortic aneurysm noted with the largest  measurement of 3.50 cm X 3.65 cm, and 3.40 cm X 3.84 cm.   ___________________________________________  [name], M.D.   MG/MEDQ  D:  10/24/2007  T:  10/24/2007  Job:  [id number]

## 2010-05-30 NOTE — Procedures (Signed)
DUPLEX ULTRASOUND OF ABDOMINAL AORTA   INDICATION:  Followup abdominal aortic aneurysm   HISTORY:  Diabetes:  no  Cardiac:  MI, coronary artery disease and stent  Hypertension:  yes  Smoking:  no  Connective Tissue Disorder:  Family History:  no  Previous Surgery:  No   DUPLEX EXAM:         AP (cm)                   TRANSVERSE (cm)  Proximal             4.14 cm                   4.0 cm  Mid                  3.00 cm                   3.14 cm  Distal               3.57 cm                   3.75 cm  Right Iliac          Not visualized            Not visualized  Left Iliac   PREVIOUS:  Date: 10/22/2008  AP:  4.5  TRANSVERSE:  4  Previous distal measurement AP 3.3, transverse 3.5.   IMPRESSION:  1. The abdominal aortic aneurysm in the proximal and mid aorta      remained stable.  2. The distal abdominal aorta has increased slightly from the previous      study to 3.57 x 3.75 cm.   ___________________________________________  [name], M.D.   EM/MEDQ  D:  01/24/2010  T:  01/24/2010  Job:  [id number]

## 2010-05-30 NOTE — Assessment & Plan Note (Signed)
[facility]                       [place] [facility] OFFICE NOTE   NAME:[name]                        MRN:          [id number]  DATE:07/15/2008                            DOB:          [birth year]    [name] comes in today for followup of his coronary artery disease.   He is having no angina or ischemic symptoms.   He has multiple complaints which is pretty much baseline for him.  He is  really bothered by his arthritis and really has trouble getting around.  He has also had a hard time with heat intolerance to summer.  He has no  significant problems when he is in the house.   He is still followed by Dr. Carylon Perches, who follows his blood work as  well.   I have reviewed his records including an ER visit back in August 2009.   He has successful bare-metal stenting of the proximal circumflex in  2006.  At that time, he had a totally occluded right coronary bridging  collaterals.  We have been treating him medically.   PHYSICAL EXAMINATION:  VITAL SIGNS:  Today, his blood pressure is 141/82  with a pulse 72 in the right arm.  HEENT:  He looks much younger than stated age.  Alert and oriented x3.  HEENT:  Otherwise, normal.  Carotid upstrokes were equal bilaterally  without bruits.  Thyroid is not enlarged.  Trachea is midline.  NECK:  Supple.  CHEST:  Lungs clear to auscultation and percussion.  HEART:  Regular rate and rhythm.  No gallop.  ABDOMEN:  Soft, good bowel sounds.  No midline bruit.  No hepatomegaly.  EXTREMITIES:  No cyanosis, clubbing, or edema.  Pulses were present.  He  has significant arthritic changes in his hands and his feet and knees.  SKIN:  Unremarkable.   ASSESSMENT AND PLAN:  [name] is doing well.  I have made no changes  in his medical program.  We will plan on seeing him back in a year.  He  will continue following closely with Dr. Ouida Sills.     Thomas C. Daleen Squibb, MD, [facility]  Electronically Signed    TCW/MedQ  DD: 07/15/2008  DT: 07/16/2008  Job #: [id number]   cc:   [name], MD

## 2010-05-30 NOTE — Assessment & Plan Note (Signed)
[facility]                       [place] [facility] OFFICE NOTE   NAME:[name], EULIS SALAZAR                        MRN:          [id number]  DATE:09/17/2007                            DOB:          Oct 22, 1921    [name] returns today for followup.  I saw him on August 19, 2007 for a  multitude of complaints.   We stopped his Lescol to see if this would help some of his generalized  weakness and fatigue.  It made no difference.  He states he feels  remarkably better now.   We also checked a B12, TSH, and a folate.  These were normal.   He is back on his Lescol now for several weeks.  He offers no other  complaints.   PHYSICAL EXAMINATION:  VITAL SIGNS:  His blood pressure is 120/68, his  pulse is 68 and regular, and weight is 188.  Rest of his exam is  unchanged.  LUNGS:  Clear.  HEART:  Reveals a regular rate and rhythm.  Normal S1 and S2.  ABDOMEN:  Soft.  EXTREMITIES:  No edema.   [name] is doing better.  He will stay on his Lescol.  We will see him  back in 6 months.     Thomas C. Daleen Squibb, MD, [facility]  Electronically Signed    TCW/MedQ  DD: 09/17/2007  DT: 09/18/2007  Job #: [id number]

## 2010-05-30 NOTE — Assessment & Plan Note (Signed)
[facility]                       [place] CARDIOLOGY OFFICE NOTE   NAME:[name], [name]                        MRN:          [id number]  DATE:08/19/2007                            DOB:          1921-09-25    HISTORY OF PRESENT ILLNESS:  [name] comes in today with a whole host  of complaints.   He has known coronary artery disease and has been stable with this.  He  denies any significant symptoms of angina.   His basic complaint is just generalized weakness and fatigue.  He says  his legs have just given out.  He went to the emergency room with  sudden onset of shoulder pain after getting up from the chair watching  TV, this was on August 17, 2007.   His evaluation was completely negative with stable EKG, which I have  reviewed today as well as the CBC and chemistry profile.  His urinalysis  is also clear.  LFTs were also normal.   He has just had a generalized weakness and fatigue for the last month or  so.  He is 14, but continues to mow his yard and does quite a bit of  activity.   CURRENT MEDICATIONS:  1. Prilosec 20 mg a day.  2. Allopurinol 300 mg a day.  3. Plavix 75 mg a day.  4. Multivitamin.  5. Aspirin 325 a day.  6. Lescol 80 mg daily.  7. Hyzaar 100/25 daily.  8. B12 p.o. daily.  9. B6 p.o. daily.   I do not see any history of B12 deficiency.  He has had no recent  thyroid profile.   PHYSICAL EXAMINATION:  GENERAL:  He looks much younger than stated age.  VITAL SIGNS:  His blood pressure is 120/72, his pulse is 77 and regular.  His EKG is reviewed from the ER and shows normal sinus rhythm with left  axis deviation with left anterior fascicular block.  He has a first-  degree AV block.  This is unchanged.  His weight is 185, which is  stable.  SKIN:  Warm and dry.  NEURO:  He is alert and oriented x3.  He has some generalized weakness  when he gets up and walks around the office.  His gait is kind of wobbly  and wide  based.  Other than his gait, his neuro exam is grossly intact.  HEENT:  Unremarkable.  NECK:  Carotid upstrokes are equal bilaterally without bruits.  No JVD.  Thyroid is not enlarged.  Trachea is midline.  LUNGS:  Clear.  HEART:  Reveals a regular rate and rhythm.  No gallop.  ABDOMEN:  Soft.  EXTREMITIES:  With no edema.  Pulses are intact.   He also had an MRI of the head at the emergency room, which generally  showed no acute abnormalities and generalized atrophy and some small-  vessel microvascular disease.  CT of the neck showed significant  cervical spondylosis and central canal narrowing at C4-C5, to a less  extent C5 through C6.  His chest x-ray was stable.   ASSESSMENT:  Mr.  [name] has  a multitude of complaints, which are really  hard to pinpoint.  Some of his shoulder pain could be from a cervical  spondylosis and canal narrowing.  However, his other symptoms are really  hard to understand.   PLAN:  1. Discontinue Lescol for 1 month to see if this helps his generalized      weakness and fatigue.  If it does not, I have advised his son to      starting back on it.  2. Check B12, folate, and TSH.  3. I will schedule followup with me in 4 weeks to see how he is doing.     Thomas C. Daleen Squibb, MD, [facility]  Electronically Signed    TCW/MedQ  DD: 08/19/2007  DT: 08/20/2007  Job #: [id number]   cc:   [name], MD

## 2010-05-30 NOTE — Procedures (Signed)
DUPLEX ULTRASOUND OF ABDOMINAL AORTA   INDICATION:  Followup abdominal aortic aneurysm.   HISTORY:  Diabetes:  No.  Cardiac:  Coronary artery disease with stent.  Hypertension:  Yes.  Smoking:  1991 quit.  Previous Surgery:   DUPLEX EXAM:         AP (cm)                   TRANSVERSE (cm)  Proximal             4.5 cm                    4 cm  Mid                  3.5 cm                    3.6 cm  Distal               3.3 cm                    3.5 cm  Right Iliac          1.3 cm                    1.4 cm  Left Iliac           0.9 cm                    1.3 cm   PREVIOUS:  Date:  AP:  3.5/3.4  TRANSVERSE:  3.5/3.8   IMPRESSION:  The abdominal aortic aneurysm in the mid and distal aorta  have remained stable at 3.5 x 3.6 and 3.3 x 3.5.  A new aneurysm has  been identified at the  proximal portion measuring 4.5 x 4 cm.   ___________________________________________  [name], M.D.   CJ/MEDQ  D:  10/22/2008  T:  10/22/2008  Job:  [id number]

## 2010-05-30 NOTE — Assessment & Plan Note (Signed)
[facility]                       [place] CARDIOLOGY OFFICE NOTE   NAME:[name]                        MRN:          [id number]  DATE:09/03/2006                            DOB:          11/02/1921    [name] returns today for followup.  I saw him last in January.  He  has known coronary artery disease with an occluded right with  collaterals and previous stenting of the circumflex.  He had a low-risk  Myoview in October 2007 with an inferior wall scar and mild peri-infarct  ischemia.  He was sent back here by Dr. Ouida Sills because of an episode of  chest pain.  The pain happened about 2 weeks ago.  It was nonexertional.  It was the left side of his chest.  It went away on its own.  He did not  take nitroglycerin for it.  It was really self-limiting.  There was no  associated dyspnea, PND or orthopnea.  No palpitations, no syncope.  It  has not recurred.   He did have an episode of stomach distress with epigastric pain and  nausea this weekend.  He relates this to some chronic reflux.   His review of systems is otherwise negative.   His medications include:  1. Prilosec 20 a day.  2. Allopurinol 300 a day.  3. Ditropan 10 mg a day.  4. Hyzaar 100/25.  5. Plavix 75 a day.  6. An aspirin a day.  7. Lescol 80 a day.   His exam is remarkable for a healthy-appearing white male in no  distress.  Affect is appropriate.  Weight is 180.  Blood pressure  118/70, pulse 70 and regular, he is afebrile.  HEENT is normal.  Lungs  are clear with good diaphragmatic motion, no wheezing  Neck is supple.  There are no bruits, no JVP elevation, no lymphadenopathy, no  thyromegaly.  There is an S1, S2 with normal heart sounds.  PMI is  normal.  Abdomen is benign.  There is no epigastric pain, no  hepatosplenomegaly, no hepatojugular reflux, no AAA, no bruits, no  tenderness.  Femorals are +2 bilaterally without bruits.  Distal pulses  are intact with no  edema.  Neurologic is nonfocal.  Skin is warm and  dry.  There is no muscular weakness.  His EKG is normal today except for  some left axis deviation and a PR interval of 210.   IMPRESSION:  1. Isolated episode of chest pain, not necessarily exertional.  The      patient does walk on a regular basis, almost 2 miles.  He does not      get chest pain with this.  Despite his coronary history I think we      can wait until October to repeat his stress test.  At that time it      will be a year.  Again, he continues to not be on a beta blocker.      However, I believe he has had fatigue with them in the past and he      has a  PR interval of 210 milliseconds.  For the time being I would      like to keep him off of this and just continue his aspirin and      Plavix.  He knows to call me if he needs to take nitroglycerin or      if he has more typical pain and we will proceed with heart      catheterization.  2. Hyperlipidemia.  Continue Lescol.  Follow up lipid and liver      profile in 6 months.  3. Hypertension, currently well controlled on Hyzaar.  No lower      extremity edema.  Blood pressures running good at home.  Continue      low-salt diet.  4. Episode of epigastric pain with some nausea, not recurrent.  If      this were to recur I think it would be reasonable to do right upper      quadrant ultrasound.  5. I did note that the patient's bare-metal stent to the circumflex in      August 2006 would necessarily not require chronic Plavix.  However,      I think for the time being we will keep him on it until we can make      sure that this isolated episode was not angina.  6. Right knee pain.  The patient has significant arthritis.  He is to      see Dr. Romeo Apple.  He appears to have some inflammation in his      knee.  If he needs total knee replacement certainly we will have to      look into his heart more.  I told him to let me know how this visit      goes and otherwise to  continue his anti-inflammatories.   I will see the patient back in October for his stress test.     Theron Arista C. Eden Emms, MD, [facility]  Electronically Signed    PCN/MedQ  DD: 09/03/2006  DT: 09/04/2006  Job #: [id number]

## 2010-05-30 NOTE — Procedures (Signed)
DUPLEX ULTRASOUND OF ABDOMINAL AORTA   INDICATION:  Follow up known abdominal aortic aneurysm.   HISTORY:  Diabetes:  No.  Cardiac:  Coronary artery disease and stent.  Hypertension:  Yes.  Smoking:  Quit in 1991.  Connective Tissue Disorder:  Family History:  Previous Surgery:   DUPLEX EXAM:         AP (cm)                   TRANSVERSE (cm)  Proximal             2.57 Cm                   2.87 Cm  Mid                  3.76 cm                   3.69 cm  Distal               3.09 cm                   3.00 cm  Right Iliac          1.24 cm                   1.11 cm  Left Iliac           1.30 cm                   1.26 cm   PREVIOUS:  Date:  AP:  3.2  TRANSVERSE:  3.6   IMPRESSION:  Abdominal aortic aneurysm noted with the largest  measurement (3.76 cm X 3.69 cm).   ___________________________________________  [name], M.D.   MG/MEDQ  D:  11/01/2006  T:  11/02/2006  Job:  [id number]

## 2010-06-02 ENCOUNTER — Other Ambulatory Visit: Payer: Self-pay | Admitting: Cardiology

## 2010-06-02 NOTE — H&P (Signed)
NAME:  [name], [name]                 ACCOUNT NO.:  [id number]   MEDICAL RECORD NO.:  [id number]          PATIENT TYPE:  INP   LOCATION:  A327                          FACILITY:  [facility]   PHYSICIAN:  Angus G. Renard Matter, MD   DATE OF BIRTH:  02-20-1921   DATE OF ADMISSION:  04/23/2006  DATE OF DISCHARGE:  LH                              HISTORY & PHYSICAL   An 77 year old white male developed intermittent lower abdominal pain  which seemed to be more confined to the left lower quadrant. Today he  presented to the emergency room and was examined by the emergency room  physician. The patient's pain was rated a 7/10. He has had no other  associated symptoms, diarrhea, etc. He does have a history of hiatal  hernia and abdominal aortic aneurysm.   LAB STUDIES:  WBC 9300, hemoglobin 12.1, hematocrit 35.1.  Chemistries,  sodium 138, potassium 4.1, chloride 103, CO2 29, glucose 108, BUN 13,  creatinine 1.44.  Liver enzymes normal. The patient had a CT scan of the  abdomen, report pending.  The patient was thought to have diverticulitis  and was started on IV Cipro and Flagyl and subsequently was admitted.   SOCIAL HISTORY:  The patient is a nonsmoker, nondrinker, does not use  drugs.   SURGICAL HISTORY:  The patient has had a history of coronary artery  disease with stent placement. He also has history of abdominal aortic  aneurysm, hiatal hernia, previous CVA, hypertension.   MEDICATION LIST:  1. Aspirin 325 mg daily.  2. Allopurinol 300 mg daily.  3. Hyzaar 25 mg daily.  4. Plavix 75 mg daily.  5. Lescol 80 mg daily.  6. Prilosec 20 mg daily.  7. Oxybutynin chloride ER 10 mg daily.   ALLERGIES:  PENICILLIN.   REVIEW OF SYSTEMS:  HEENT:  Negative. CARDIOPULMONARY:  No chest pain,  dyspnea or cough.  GI:  The patient has had abdominal pain and cramps in  the lower abdomen.  No vomiting or diarrhea.  GU:  No dysuria or  hematuria.   PHYSICAL EXAMINATION:  VITAL SIGNS:  Blood pressure  119/61, respirations  18, pulse 60, temperature 97.5.  HEENT:  Eyes PERRLA. TMs negative, oropharynx benign.  NECK:  Supple.  No JVD or thyroid abnormalities.  HEART:  Regular rhythm, no murmurs.  No cardiomegaly.  LUNGS:  Clear to P&A.  ABDOMEN:  Tenderness over the left lower quadrant, no rebound  tenderness.  SKIN:  Essentially negative.  NEUROLOGIC:  No focal deficit.   DIAGNOSIS:  Abdominal pain, probable diverticulitis.   PLAN:  1. Continue IV antibiotics.  2. Continue to control pain with IV Dilaudid.      Angus G. Renard Matter, MD  Electronically Signed     AGM/MEDQ  D:  04/23/2006  T:  04/24/2006  Job:  [id number]

## 2010-06-02 NOTE — Discharge Summary (Signed)
NAME:  ZORAWAR, STROLLO NO.:  [id number]   MEDICAL RECORD NO.:  [id number]          PATIENT TYPE:  INP   LOCATION:  A327                          FACILITY:  [facility]   PHYSICIAN:  [name], MD       DATE OF BIRTH:  03/17/1921   DATE OF ADMISSION:  04/23/2006  DATE OF DISCHARGE:  04/10/2008LH                               DISCHARGE SUMMARY   DISCHARGE DIAGNOSES:  1. Diverticulitis.  2. Coronary artery disease.  3. Abdominal aortic aneurysm.  4. Hypertension.  5. Hyperlipidemia.  6. A history of stroke.  7. Overactive bladder.  8. Gastroesophageal reflux disease.   DISCHARGE MEDICATIONS:  1. Cipro 500 mg b.i.d.  2. Flagyl 500 mg t.i.d.  3. Hyzaar 100/25 mg daily.  4. Plavix 75 mg daily.  5. Aspirin 325 mg daily.  6. Lescol XL 80 mg daily.  7. Allopurinol 300 mg daily.  8. Prilosec 20 mg b.i.d.  9. Oxybutynin-ER 10 mg daily.  10.Nitroglycerin p.r.n.   HOSPITAL COURSE:  This patient is an 77 year old male who presented with  left lower quadrant pain.  He underwent a CT scan of the abdomen which  revealed sigmoid colon thickening consistent with diverticulitis.  He  was treated with Cipro and Flagyl.  His pain responded well.  He was  improved and stable for discharge on the 10th.  He will complete his  antibiotic course with oral Cipro and Flagyl.  He will follow up in the  office in 10 days.   He was afebrile.  His white count was 9.3.      [name], MD  Electronically Signed    ROF/MEDQ  D:  04/25/2006  T:  04/25/2006  Job:  [id number]

## 2010-06-02 NOTE — Assessment & Plan Note (Signed)
[facility]                              CARDIOLOGY OFFICE NOTE   NAME:[name], CHARES SLAYMAKER                        MRN:          [id number]  DATE:10/26/2005                            DOB:          09-May-1921    [name] is seen today in followup.   He has had previous angioplasty of the proximal circumflex coronary artery.   This was done in August of 2006 by Dr. Samule Ohm.   At that time the right coronary artery was also totally occluded with  bridging collaterals.   The patient had successful bare-metal stenting of the circumflex.   His ejection fraction is in the 45% range.   About a month ago, he was having some atypical chest pain, it was not  clearly exertional.  The patient walks a couple of miles in the morning, and  this did not exacerbate it.  He thought it was secondary to his hush  puppies.  He has been taking Prevacid.   He has not had any pain in the last 2 weeks, and thinks the Prevacid has  helped.  His coronary risk factors include hypertension, which he takes  Hyzaar for.  He also has hypercholesterolemia, on lovastatin.  His last LDL  cholesterol was 118 on October 01, 2005.   He is a nonsmoker.   The patient's wife, unfortunately, has been at [facility] since  1992 with a stroke and a feeding tube.  He has 2 children in the area with  multiple grandchildren, and seems to enjoy himself.   PHYSICAL EXAMINATION:  He is tall and thin.  He is in no distress.  The blood pressure is 112/68, pulse is 70 and regular.  LUNGS:  Clear.  CAROTIDS:  Normal.  There are S1, S2 heart sounds.  ABDOMEN:  Benign.  LOWER EXTREMITIES:  Intact pulses, no edema.   MEDICATIONS:  1. Prilosec 10 mg a day.  2. Ditropan 10 mg a day.  3. Hyzaar 100/12.5 mg.  4. Plavix 75 mg a day.  5. An aspirin a day.  6. Lovastatin 40 mg a day.   IMPRESSION:  Stable, history of circumflex stenting, total right coronary  artery with bridging  collaterals.  The patient will have a stress Myoview  next week.  He may have slight abnormalities due to his collateralization,  but as long as the lateral wall is okay I think his symptoms have been  improving with the treatment for reflux, and he is active without recent  chest pain.  He will continue his aspirin and Plavix, even though he has had  a bare-metal stent to the circumflex.   Further recommendations will be based on the results of his stress test.  I  think in the future we could probably switch him to 80 mg of Lipitor, since  his LDL cholesterol is still 118 on 40 mg of lovastatin.   I will see him back in 3 months.            ______________________________  Noralyn Pick. Eden Emms, MD, [facility]  PCN/MedQ  DD:  10/26/2005  DT:  10/28/2005  Job #:  [id number]   cc:   [name], MD

## 2010-06-02 NOTE — Assessment & Plan Note (Signed)
[facility]                       [place] CARDIOLOGY OFFICE NOTE   NAME:[name]                        MRN:          [id number]  DATE:01/23/2006                            DOB:          10/24/1921    [name] returns today for followup.  He has had total right coronary  artery with collaterals and stenting of the proximal circumflex with an  EF of 45%.  His last Myoview study done October 30, 2005 was low risk  with an inferior wall scar and mild peri-infarction ischemia.  He has  not been having chest pain.  He continues to be fairly active.  His wife  is an invalid and is cared for at the [facility].  He sees her in the  afternoons.  The patient needs a prescription for sublingual  nitroglycerin.   REVIEW OF SYSTEMS:  Remarkable for no PND, orthopnea.  No syncope, no  palpitations and no chest pain.   MEDICATIONS:  1. Prilosec 20 a day.  2. Allopurinol 300 a day.  3. Ditropan 10 a day.  4. Hyzaar 100/25.  5. Plavix 75 a day.  6. Aspirin daily.  7. Lescol.   PHYSICAL EXAMINATION:  VITAL SIGNS:  Blood pressure is 120/70, pulse 72  and regular.  HEENT:  Normal.  There is no thyromegaly, no lymphadenopathy, no carotid  bruits.  LUNGS:  Clear.  HEART:  There is an S1 and S2.  Normal heart sounds.  ABDOMEN:  Benign.  LOWER EXTREMITIES:  Intact pulses.  No edema.   IMPRESSION:  Stable coronary disease, total RCA with collaterals,  stenting of the proximal circumflex with low-risk Myoview in October.  Continue her medical therapy.  I did note that he is not on a beta  blocker.  Currently, he is stable.  I gave him a prescription for  nitroglycerin.  In the future, we can add this to his medical regimen if  he has recurrent angina.   We will check his lipids in 6 months.  He will continue on his ACE  inhibitor, given his EF of 43%.   He did have a bare metal stent, but we will continue his aspirin and  Plavix indefinitely.     James Pick. Eden Emms, MD, [facility]  Electronically Signed    PCN/MedQ  DD: 01/23/2006  DT: 01/23/2006  Job #: [id number]

## 2010-06-02 NOTE — Op Note (Signed)
NAME:  [name], [name]                           ACCOUNT NO.:  [id number]   MEDICAL RECORD NO.:  [id number]                   PATIENT TYPE:  AMB   LOCATION:  DAY                                  FACILITY:  [facility]   PHYSICIAN:  Lionel December, M.D.                 DATE OF BIRTH:  [birth year]   DATE OF PROCEDURE:  06/03/2002  DATE OF DISCHARGE:                                 OPERATIVE REPORT   PROCEDURE:  Esophagogastroduodenoscopy with esophageal dilatation.   INDICATIONS FOR PROCEDURE:  The patient is a 77 year old Caucasian male with  symptoms of GERD who presented with dysphagia.  He had a barium study which  revealed laryngeal penetration without aspiration, some esophageal  dysmotility, and a stricture at the GE junction.  A 13-mm pill got stuck at  this area.  He is therefore coming in for EGD and ED.  He had his esophagus  last dilated in July, 2000.  At that time, no stricture ring was noted, but  he did respond to ED.  The procedure was reviewed with the patient, and  informed consent was obtained.   PREOPERATIVE MEDICATIONS:  Cetacaine spray for pharyngeal topical  anesthesia, Demerol 25 mg IV, Versed 4 mg IV.   INSTRUMENT USED:  Olympus video system.   FINDINGS:  The procedure was performed in the endoscopy suite.  The  patient's vital signs and O2 saturations were monitored during the procedure  and remained stable.  The patient was placed in the left lateral position.  The endoscope was passed via the oropharynx without any difficulty into the  esophagus.   Esophagus:  The mucosa of the esophagus was normal throughout.  The distal  body was somewhat tortuous.  The squamocolumnar junction was unremarkable.  There was a small sliding hiatal hernia.  This area was carefully examined,  and no ring or stricture was identified.   Stomach:  It was empty and distended very well with insufflation.  The folds  of the proximal stomach were normal.  Examination of the mucosa at  the body,  antrum, pyloric channel, as well as angularis, fundus, and cardia was  normal.   Duodenum:  Examination of the bulb and postbulbar duodenum was normal.  The  endoscope was withdrawn.   The esophagus was initially dilated by passing a 56 [place] Maloney dilator.  The esophagus was reexamined.  There was no mucosal injury.  Therefore, I  opted to dilate the distal esophagus with a balloon up to a diameter of 20  mm.  The balloon catheter was positioned and inflated to this diameter and  maintained for about a minute.  It was repositioned once.  This also did not  result in mucosal injury.  The endoscope was withdrawn.  The patient  tolerated the procedure well.   FINAL DIAGNOSIS:  Small sliding hiatal hernia.  No evidence of esophageal  stricture as suspected  based on barium study.  I suspect his dysphagia may  be due to esophageal dysmotility.   RECOMMENDATIONS:  He will continue antireflux measures and Nexium as before.  I would like for him to spend a little extra time chewing his food and eat  slowly.  If this remains a problem, we will consider esophageal manometry.                                               Lionel December, M.D.    NR/MEDQ  D:  06/03/2002  T:  06/03/2002  Job:  [id number]   cc:   [name], M.D.  [address]  [place]  [place] [address]  Fax: [phone]

## 2010-06-02 NOTE — Op Note (Signed)
NAME:  [name], [name]                           ACCOUNT NO.:  [id number]   MEDICAL RECORD NO.:  [id number]                   PATIENT TYPE:  AMB   LOCATION:  DAY                                  FACILITY:  [facility]   PHYSICIAN:  Lionel December, M.D.                 DATE OF BIRTH:  [birth year]   DATE OF PROCEDURE:  11/03/2001  DATE OF DISCHARGE:                                 OPERATIVE REPORT   PROCEDURE:  Total colonoscopy.   INDICATIONS:  The patient is a 77 year old Caucasian male with recent change  in his bowel habits and hematochezia.  His last colonoscopy was over five  years ago, and the polyps that he had removed were hyperplastic.  He is  undergoing diagnostic colonoscopy.  The procedure was reviewed with the  patient and informed consent was obtained.   PREMEDICATION:  Demerol 20 mg IV, Versed 3 mg IV, atropine 0.3 mg IV.   ENDOSCOPE:  Olympus video system.   FINDINGS:  Procedure performed in endoscopy suite.  The patient's vital  signs and O2 saturations were monitored during the procedure and remained  stable.  The patient was placed in the left lateral recumbent position and  rectal examination performed.  This was within normal limits.  The scope was  placed in the rectum and advanced under vision into the sigmoid colon and  beyond.  Preparation was excellent.  The scope was passed to the cecum,  which was identified by ileocecal valve and appendiceal orifice.  Pictures  were taken for the record and are part of her database.  As the scope was  withdrawn, colonic mucosa was once again carefully examined.  Scattered  diverticula were noted at the descending and sigmoid colon.  There were no  polyps or tumor masses.  Rectal mucosa was normal.  The scope was  retroflexed and examined at the anorectal junction.  Small hemorrhoids noted  below the dentate line.  There were two tiny polyps about the dentate line  with typical appearance of hyperplastic polyps, and these were  left alone.  The endoscope was straightened and withdrawn.  The patient tolerated the  procedure well.   FINAL DIAGNOSES:  1. Examination performed to cecum.  2. Left colonic diverticulosis, which is mild.  3. Small external hemorrhoids.    RECOMMENDATIONS:  He will continue high-fiber diet, Benafiber 4 mg q.d., and  Colace two tablets at bedtime or one b.i.d.  If he continues to have bowel  problems, he will present for follow-up visit.                                                 Lionel December, M.D.    NR/MEDQ  D:  11/03/2001  T:  11/03/2001  Job:  [id number]   cc:   [name], M.D.

## 2010-06-02 NOTE — Cardiovascular Report (Signed)
NAME:  NATHIAN, STENCIL NO.:  [id number]   MEDICAL RECORD NO.:  [id number]          PATIENT TYPE:  OIB   LOCATION:  6501                         FACILITY:  [facility]   PHYSICIAN:  [name], M.D.   DATE OF BIRTH:  06-02-1921   DATE OF PROCEDURE:  08/17/2004  DATE OF DISCHARGE:                              CARDIAC CATHETERIZATION   PRIMARY:  Dr. Carylon Perches   HISTORY OF PRESENT ILLNESS:  [name] is an 77 year old man who has history  of a stroke in the past who comes to see me after he was evaluated by his  primary care physician and found to have an abnormal perfusion study with  evidence of inferior wall infarction without any obvious residual ischemia.  Also had depressed LV systolic function with an ejection fraction of about  45%.  We discussed the approach to his therapy and he requested invasive  assessment.   DETAILS OF THE PROCEDURE:  After obtaining informed consent the patient was  brought to the cardiac catheterization laboratory in a fasting state.  There  he was prepped and draped in the usual sterile manner and local anesthetic  was obtained over the right wrist using 1% lidocaine without epinephrine.  The right radial artery was cannulated using the modified Seldinger  technique and a left heart catheterization was performed using a Judkins  left #4, a Judkins right #4, a Judkins right #3.5, and a pigtail catheter  all 5-French.  The pigtail catheter was used for both left ventriculography  as well as aortography.  The left ventriculography was performed in the 30  degree RAO view.  Aortography was performed in the 30 degree LAO, 30 degree  caudal view.  At the conclusion of the procedure the catheters were removed.  The patient was had a Radstat device placed in his right wrist which  provided adequate hemostasis and he recovered without difficulty.   RESULTS:  Aortic pressure 141/66 with a mean of 97 mmHg.   Left ventricular pressure 142/14  with an end-diastolic pressure of 22 mmHg.   CORONARY ANGIOGRAPHY:  The left main coronary artery is a moderate caliber  vessel which is angiographically normal.   The left anterior descending coronary artery is a large caliber vessel which  is transapical.  It has several diagonal branches all of whom are free of  disease.  There is approximately a 40% lesion in the proximal portion of the  LAD.   The circumflex coronary artery is a small to moderate caliber vessel which  has several branching obtuse marginals.  It has a 99% stenosis in its mid  portion after the first obtuse marginal and has TIMI 2 flow in the distal  portion of the vessel.   The right coronary artery was not engaged.  It appears to be occluded  ostially.  It was not imaged either with flush shot into the right coronary  sinus as well as an aortography done with a pigtail catheter.   The left ventriculogram reveals mildly depressed LV systolic function with  an estimated ejection fraction of 40%.  There is inferior inferior apical  hypokinesis.  No mitral regurgitation was seen.  Please note that this left  ventriculogram was not recorded on cineangiography.   RESULTS:  1.  Two-vessel coronary artery disease in the circumflex and right coronary      arteries.  2.  Mildly depressed left ventricular systolic function.  3.  Normal left heart pressures.  4.  Normal aortic root.   ASSESSMENT:  I think that the circumflex coronary artery is likely amenable  to angioplasty, although I doubt that the right coronary artery is.  There  were no obvious extensive collateralization to the inferior wall from the  left system; however, there were some small collaterals.  My plan, then, is  to discuss this with our interventional cardiology colleagues.       JH/MEDQ  D:  08/17/2004  T:  08/17/2004  Job:  [id number]   cc:   [name], MD  [address]  [place]  [place] [address]  Fax: [phone]

## 2010-06-02 NOTE — Procedures (Signed)
NAME:  KEOLA, HENINGER NO.:  [id number]   MEDICAL RECORD NO.:  [id number]          PATIENT TYPE:  REC   LOCATION:                                FACILITY:  [facility]   PHYSICIAN:  [name], MD       DATE OF BIRTH:  1921-02-28   DATE OF PROCEDURE:  08/09/2004  DATE OF DISCHARGE:                                    STRESS TEST   [name] exercised 9 minutes (completing stage 3 of the Bruce protocol)  obtaining a maximum heart rate of 146 (105% of the age-predicted maximal  heart rate) at a work load of 10.1 METS and discontinued exercise due to  fatigue. There were no symptoms of chest pain. There was frequent  ventricular ectopy during recovery with PVCs, couplets, and a triplet. There  was 2-mm lateral ST segment depression. Baseline EKG revealed normal sinus  rhythm at 64 beats per minute.   IMPRESSION:  Abnormal exercise stress test. Myoview images pending.       ROF/MEDQ  D:  08/09/2004  T:  08/09/2004  Job:  [id number]

## 2010-06-02 NOTE — Cardiovascular Report (Signed)
NAME:  [name], CORNING NO.:  [id number]   MEDICAL RECORD NO.:  [id number]          PATIENT TYPE:  OIB   LOCATION:  6523                         FACILITY:  [facility]   PHYSICIAN:  Salvadore Farber, M.D. LHCDATE OF BIRTH:  11-Nov-1921   DATE OF PROCEDURE:  08/23/2004  DATE OF DISCHARGE:                              CARDIAC CATHETERIZATION   PROCEDURE:  Diagnostic angiography of the right coronary artery, bare metal  stenting of the proximal circumflex.   INDICATION:  [name] is an 77 year old gentleman who presents with new  onset severe exertional dyspnea.  Exercise Cardiolite demonstrated lateral  ischemia and he underwent diagnostic angiography by Dr. Dorethea Clan last week.  This demonstrated 99% stenosis of the proximal circumflex over a long  segment of vessel.  Dr. Dorethea Clan was unable to engage the RCA.  Patient  returns today for diagnostic angiography of the RCA and probable  percutaneous intervention on the circumflex.   PROCEDURAL TECHNIQUE:  Informed consent was obtained.  The patient had been  preloaded on clopidogrel.  Under 1% lidocaine local anesthesia, a 6 French  sheath was placed in the right common femoral artery using the modified  Seldinger technique.  Anticoagulation was initiated with bivalirudin.  ACT  was confirmed to be greater than 225 seconds.  I began with diagnostic  angiography RCA.  The vessel had a high anterior takeoff.  It was selected  using a 3D RCA catheter.  This demonstrated there to be approximately 15 mm  chronic total occlusion of the proximal vessel with bridging collaterals to  the distal vessel.  Thus, the distal vessel is collateralized from both the  left and the right.   Attention was then turned to the circumflex.  A VL3.5 guide was advanced  over a wire and engaged in the ostium of the left main.  I initially  attempted to cross the lesion with a ProWater without success.  I was then  able to advance a whisper wire into  the first obtuse marginal.  I was unable  to advance into the distal portion of the AV groove circumflex using an  additional whisper wire.  In fact, the wire was nearly occlusive in the  lesion and it was very difficult to see where this vessel arose.  Therefore,  predilation was performed using a 2.0 x 15 mm Maverick proximal to where the  bifurcation was felt to be likely.  With this, flow was improved to TIMI-3,  allowing visualization of the branch.  There was, however, a type B  dissection as a result of this angioplasty.  I was unable to advance whisper  wire into the AV groove circumflex using the wire alone.  I then turned to a  Venture catheter, with which I was able to steer the wire into the severely  angled AV groove circumflex.  The Venture catheter was then carried over  this wire and the wire exchanged for a long BMW.  The lesion was then  predilated using the 2.0 x 15 mm Maverick for two inflations at 8  atmospheres.  I then  made multiple attempts to advance a 2.75 x 32 mm TAXUS  stent across the lesion.  I was unable to make it take the turn just beyond  the takeoff of the first obtuse marginal.  I exchanged for a stiffer mailman  wire via a balloon catheter and was still unable to deliver the stent.  I  remained unable to deliver it despite using a wiggle wire as a buddy and  eventually switching back to a Hi-Torque Floppy.  All wire exchanges were  performed via the balloon lumen.   I then attempted to advance a 2.5 x 28 mm MiniVision stent across the  lesion.  This, too, would not pass the tortuous portion of this vessel.  Additional predilation was performed to no avail.  I, therefore, decided to  stent the proximal portion of the lesion as this was the only thing that I  could advance the stent across.  A 2.5 x 18 mm MiniVision was advanced as  far as possible and deployed.  This did not cover the distal extent of the  dissection.  The dissected area was then further  dilated using a 2.5 x 20 mm  Maverick at 8 atmospheres.  Final angiography demonstrated less than 20%  residual stenosis distal to the stent, no residual stenosis within the  stent, and a type A non-flow limiting dissection, flow is TIMI-3, throughout  the procedure after initial balloon inflation.   The arteriotomy was then closed using a StarClose device.  The patient  tolerated the procedure well and was transferred to the holding room in  stable condition.   COMPLICATIONS:  None.   FINDINGS:  1.  Right coronary artery moderate sized dominant vessel which is totally      occluded proximally, there are bridging collaterals.  Length of the      total occlusion is approximately 15 mm.  2.  Circumflex 99% stenosis reduced to less than 20%.  Flow improved from      TIMI-2 to TIMI-3.   IMPRESSION PLAN:  Successful bare metal stenting of the proximal circumflex.  There remains a non-flow limiting dissection distal to the stented region.  A stent could not be advanced across this segment.  The patient will,  therefore, be continued on aspirin indefinitely and Plavix for 90 days.  ReoPro was given as bail-out at the completion of the procedure.  He will be  observed for 48 hours prior to discharge.       WED/MEDQ  D:  08/23/2004  T:  08/23/2004  Job:  [id number]   cc:   [name], M.D.  Fax: [phone]   [name], MD  [address]  [place]  [place] [address]  Fax: [phone]

## 2010-06-02 NOTE — Discharge Summary (Signed)
NAME:  [name], [name] NO.:  [id number]   MEDICAL RECORD NO.:  [id number]          PATIENT TYPE:  OIB   LOCATION:  6523                         FACILITY:  [facility]   PHYSICIAN:  Salvadore Farber, M.D. LHCDATE OF BIRTH:  Mar 16, 1921   DATE OF ADMISSION:  08/23/2004  DATE OF DISCHARGE:  08/24/2004                                 DISCHARGE SUMMARY   PROCEDURES:  1.  Cardiac catheterization  2.  Angiogram of the RCA.  3.  Bare-metal stent to the circumflex.   HOSPITAL COURSE:  [name] is an 77 year old male with a history of  coronary artery disease.  He was evaluated in the office on August 14, 2004  and had a cardiac catheterization.  The cardiac catheterization showed a 99%  stenosis in the circumflex and he was brought back on August 23, 2004 for  PCI.   The stenosis was decreased to 99%.  He had TIMI-3 flow after the procedure.   On August 24, 2004 [name] was evaluated by Dr. Samule Ohm.  It was felt that  he needed 24 more hours of observation secondary to dissection that was  distal to the vessel.  His MB was within normal limits of 3.2.  His EKG was  unchanged.  His other labs were stable.  He had a groin ooze that was  treated with an injection of Lidocaine and epinephrine and is resolving.  Unless [name] further problems he is tentatively considered stable for  discharge on August 25, 2004 with outpatient followup arranged.   DISCHARGE INSTRUCTIONS:  1.  His activity level is to include no driving for two days and no      strenuous activity for two weeks.  2.  He is to call our office for problems with the cath site.  3.  He is to stick to a low-fat diet.  4.  He is to followup with Dr. Ouida Sills as needed and he is to followup with      Dr. Dorethea Clan in [place].   DISCHARGE MEDICATIONS:  1.  Plavix 75 mg a day for 90 days.  2.  Aspirin 325 mg daily.  3.  HCTZ 25 mg daily.  4.  Hyzaar 100/25 mg daily.  5.  Protonix 40 mg daily.  6.  Lescol 80 mg  daily.  7.  Allopurinol 300 mg daily.  8.  Sublingual nitroglycerin p.r.n.  9.  Ditropan XL 10 mg daily.       RB/MEDQ  D:  08/24/2004  T:  08/24/2004  Job:  [id number]   cc:   [name], MD  [address]  [place]  [place] [address]  Fax: [phone]   [name], M.D.  Fax: [phone]

## 2010-06-08 ENCOUNTER — Emergency Department (HOSPITAL_COMMUNITY): Payer: Medicare Other

## 2010-06-08 ENCOUNTER — Inpatient Hospital Stay (HOSPITAL_COMMUNITY)
Admission: EM | Admit: 2010-06-08 | Discharge: 2010-06-10 | DRG: 303 | Disposition: A | Discharge status: Home / Self Care | Payer: Medicare Other | Attending: Internal Medicine | Admitting: Internal Medicine

## 2010-06-08 DIAGNOSIS — I200 Unstable angina: Secondary | ICD-10-CM | POA: Diagnosis present

## 2010-06-08 DIAGNOSIS — I714 Abdominal aortic aneurysm, without rupture, unspecified: Secondary | ICD-10-CM | POA: Diagnosis present

## 2010-06-08 DIAGNOSIS — J4489 Other specified chronic obstructive pulmonary disease: Secondary | ICD-10-CM | POA: Diagnosis present

## 2010-06-08 DIAGNOSIS — Z8673 Personal history of transient ischemic attack (TIA), and cerebral infarction without residual deficits: Secondary | ICD-10-CM

## 2010-06-08 DIAGNOSIS — Z9861 Coronary angioplasty status: Secondary | ICD-10-CM

## 2010-06-08 DIAGNOSIS — I10 Essential (primary) hypertension: Secondary | ICD-10-CM | POA: Diagnosis present

## 2010-06-08 DIAGNOSIS — E119 Type 2 diabetes mellitus without complications: Secondary | ICD-10-CM | POA: Diagnosis present

## 2010-06-08 DIAGNOSIS — J449 Chronic obstructive pulmonary disease, unspecified: Secondary | ICD-10-CM | POA: Diagnosis present

## 2010-06-08 DIAGNOSIS — I059 Rheumatic mitral valve disease, unspecified: Secondary | ICD-10-CM

## 2010-06-08 DIAGNOSIS — R079 Chest pain, unspecified: Secondary | ICD-10-CM

## 2010-06-08 DIAGNOSIS — E785 Hyperlipidemia, unspecified: Secondary | ICD-10-CM | POA: Diagnosis present

## 2010-06-08 DIAGNOSIS — G40909 Epilepsy, unspecified, not intractable, without status epilepticus: Secondary | ICD-10-CM | POA: Diagnosis present

## 2010-06-08 DIAGNOSIS — I2582 Chronic total occlusion of coronary artery: Secondary | ICD-10-CM | POA: Diagnosis present

## 2010-06-08 DIAGNOSIS — I2510 Atherosclerotic heart disease of native coronary artery without angina pectoris: Principal | ICD-10-CM | POA: Diagnosis present

## 2010-06-08 LAB — BASIC METABOLIC PANEL
BUN: 11 mg/dL (ref 6–23)
Creatinine, Ser: 1.34 mg/dL (ref 0.4–1.5)
GFR calc non Af Amer: 50 mL/min — ABNORMAL LOW (ref >60)
Glucose, Bld: 111 mg/dL — ABNORMAL HIGH (ref 70–99)
Potassium: 3.7 mEq/L (ref 3.5–5.1)

## 2010-06-08 LAB — CBC
HCT: 39.1 % (ref 39.0–52.0)
Hemoglobin: 13.6 g/dL (ref 13.0–17.0)
MCV: 92.9 fL (ref 78.0–100.0)
RBC: 4.21 MIL/uL — ABNORMAL LOW (ref 4.22–5.81)
RDW: 14 % (ref 11.5–15.5)
WBC: 5.4 10*3/uL (ref 4.0–10.5)

## 2010-06-08 LAB — PROTIME-INR: INR: 0.94 (ref 0.00–1.49)

## 2010-06-08 LAB — DIFFERENTIAL
Basophils Absolute: 0 10*3/uL (ref 0.0–0.1)
Lymphocytes Relative: 28 % (ref 12–46)
Lymphs Abs: 1.5 10*3/uL (ref 0.7–4.0)
Neutro Abs: 3 10*3/uL (ref 1.7–7.7)
Neutrophils Relative %: 56 % (ref 43–77)

## 2010-06-08 LAB — TROPONIN I: Troponin I: <0.3 ng/mL (ref <0.30)

## 2010-06-08 LAB — CK TOTAL AND CKMB (NOT AT ARMC)
Relative Index: 2.5 (ref 0.0–2.5)
Total CK: 139 U/L (ref 7–232)

## 2010-06-08 LAB — APTT: aPTT: 29 seconds (ref 24–37)

## 2010-06-08 LAB — CARDIAC PANEL(CRET KIN+CKTOT+MB+TROPI): Relative Index: 2.6 — ABNORMAL HIGH (ref 0.0–2.5)

## 2010-06-08 NOTE — Consult Note (Signed)
NAME:  [name] NO.:  [id number]  MEDICAL RECORD NO.:  [id number]           PATIENT TYPE:  I  LOCATION:  A306                          FACILITY:  [facility]  PHYSICIAN:  Jonelle Sidle, MD DATE OF BIRTH:  1921/03/07  DATE OF CONSULTATION: DATE OF DISCHARGE:                                CONSULTATION   PRIMARY CARDIOLOGIST:  Jesse Sans. Wall, MD, FACC  REASON FOR CONSULTATION:  Chest pain.  HISTORY OF PRESENT ILLNESS:  [name] is a pleasant 77 year old male with a history of coronary artery disease, status post previous bare metal stent placement to the proximal circumflex in 2006, with occluded right coronary artery associated with bridging collaterals, managed medically until December 2010, when he underwent repeat cardiac catheterization related to progressive shortness of breath and angina. At that time, he was found to have an occluded proximal circumflex extending from the ostium of the vessel to just before the first obtuse marginal at the site of the previous stent placement.  Left to left collaterals were noted from the LAD to the circumflex, which itself had a 50-60% proximal stenosis.  At that time, medical therapy was felt to be the best option.  He was last seen in the office by Dr. Daleen Squibb for followup in August 2011, at which time he was treated medically.  He also follows with Dr. Delford Field for COPD and Dr. Arbie Cookey for an infrarenal abdominal aortic aneurysm.  [name] presents to the [facility] Emergency Department stating that he woke early this morning around 2 a.m. with chest tightness consistent with angina and associated with shortness of breath.  He took 2 sublingual nitroglycerin tablets, which helped the symptoms to resolve after approximately 15 minutes.  He woke again with symptoms, which resolved, ultimately told his daughter, and was taken in for further assessment.  In the emergency department, he has been clinically stable with  no recurrent chest pain symptoms.  His electrocardiogram shows sinus rhythm at 86 beats per minute with a prolonged PR interval of 252 milliseconds and left anterior fascicular block.  No major changes noted compared to prior tracing from October 2011.  Initial cardiac markers are normal with a troponin-I less than 0.30 and a CK-MB of 3.5.  He is being admitted for further observation.  We are consulted for further evaluation as well.  ALLERGIES:  No known drug allergies.  MEDICATIONS:  Include: 1. Aspirin 325 mg p.o. daily. 2. Plavix 75 mg p.o. daily. 3. Imdur 60 mg p.o. daily. 4. Losartan 100 mg p.o. daily. 5. Metoprolol 25 mg p.o. daily. 6. Trileptal 300 mg p.o. q.a.m. and 450 mg p.o. nightly. 7. Ditropan 5 mg p.o. b.i.d. 8. Allopurinol 300 mg p.o. daily. 9. Zocor 40 mg p.o. nightly. 10.Spiriva once daily. 11.ProAir 2 puffs q.4 h p.r.n. 12.Lovenox 40 units subcu daily.  Past medical history is discussed above.  Additional problems include COPD, hyperlipidemia, stroke in 1995, recently diagnosed seizure disorder, gout, hypertension, hiatal hernia, infrarenal abdominal aortic aneurysm measuring 3.57 x 3.75 cm in January 2012.  FAMILY HISTORY:  Reviewed.  Significant for emphysema in a brother,  heart disease in a sister, rheumatism in the patient's mother, lung cancer in a brother, throat cancer in a brother.  SOCIAL HISTORY:  The patient denies any alcohol use.  Quit smoking cigarettes in 1992 after a 25 pack-year history.  REVIEW OF SYSTEMS:  As outlined above.  He has chronic NYHA class II- III, dyspnea on exertion, intermittent chest pain.  Reports no recent seizures, states that he has planned followup with [name]. No fevers or chills, no cough or hemoptysis.  Reports compliance with medications.  No melena or hematochezia.  Otherwise reviewed negative.  PHYSICAL EXAMINATION:  VITAL SIGNS:  Blood pressure initially was 183/101, now down to 180/85,  temperature 97.5 degrees, heart rate 67 and sinus rhythm, respirations 20, ox saturation is 97% on room air. GENERAL:  This is a well-developed elderly male in no acute distress without active chest pain. HEENT:  Conjunctivae and lids normal.  Oropharynx is clear. NECK:  Supple.  No elevated JVP or loud bruits.  No thyromegaly. LUNGS:  Clear without labored breathing at rest. CARDIAC:  Regular rate and rhythm.  Soft S4 and soft systolic murmur. No pericardial rub or S3 gallop. ABDOMEN:  Soft, nontender.  Bowel sounds present.  No unusual pulsatility or bruit. EXTREMITIES:  Exhibit trace ankle edema.  Distal pulses 1+. SKIN:  Warm and dry. MUSCULOSKELETAL:  No kyphosis is noted. NEUROPSYCHIATRIC:  The patient is alert and oriented x3.  Affect seems appropriate  LABORATORY DATA:  WBC is 5.4, hemoglobin 13.6, hematocrit 39.1, platelets 146, INR 0.9.  Sodium 130, potassium 3.7, chloride 96, bicarb 25, glucose 111, BUN 11, creatinine 1.3, CK 139, CK-MB 3.5, troponin-I less than 0.30.  Chest x-ray from Jun 08, 2010, reports mild bronchitic changes with normal cardiomediastinal contours.  IMPRESSION: 1. Chest pain syndrome, concerning for unstable angina, in the setting     of known multivessel coronary artery disease.  At this point,     electrocardiogram is nonspecific, and initial cardiac markers are     normal.  The patient is hemodynamically stable, presenting     hypertensive. 2. Multivessel coronary artery disease as outlined, status post bare     metal stent placement to the proximal circumflex in 2006, now found     to be occluded as of cardiac catheterization in December 2010,     known chronic occlusion of the right coronary artery, and both left-     to-right and left-to-left bridging collaterals with moderate left     anterior descending disease that has been managed medically.  Last     assessment of ejection fraction was January 2011, left ventricular     ejection  fraction 60-65%. 3. Chronic obstructive pulmonary disease, followed by Dr. Delford Field. 4. Infra-abdominal aortic aneurysm, stable by followup ultrasound in     January of this year, followed by Dr. Arbie Cookey. 5. Hypertension, blood pressure increased at present.  The patient     reports medication compliance. 6. Hyperlipidemia, on statin therapy. 7. Remote history of stroke, with reported subsequent seizures     disorder, recently diagnosed, to have follow up with [facility]     Strickland.  He has been following with Dr. Ninetta Lights in [place] until     recently.  RECOMMENDATIONS:  Agree with hospital admission for further observation. Would anticoagulate until full set of markers have returned, continue present outpatient cardiac regimen, and follow up with both 2-D echocardiogram and electrocardiogram.  Our service will follow with you. Depending on the patient's clinical stability, and  objective testing, we can determine the next step in terms of further ischemic workup.     Jonelle Sidle, MD     SGM/MEDQ  D:  06/08/2010  T:  06/08/2010  Job:  [id number]  cc:   Thomas C. Wall, MD, FACC [address]  [address] [place] [address]  [name], MD Fax: [phone]  Electronically Signed by Nona Dell MD on 06/08/2010 05:54:38 PM

## 2010-06-09 DIAGNOSIS — I200 Unstable angina: Secondary | ICD-10-CM

## 2010-06-09 LAB — CARDIAC PANEL(CRET KIN+CKTOT+MB+TROPI)
CK, MB: 2.8 ng/mL (ref 0.3–4.0)
Relative Index: 2.3 (ref 0.0–2.5)
Troponin I: <0.3 ng/mL (ref <0.30)

## 2010-06-14 NOTE — Group Therapy Note (Signed)
  NAME:  [name], [name]                 ACCOUNT NO.:  [id number]  MEDICAL RECORD NO.:  [id number]           PATIENT TYPE:  I  LOCATION:  A306                          FACILITY:  [facility]  PHYSICIAN:  Leshae Mcclay L. Juanetta Gosling, M.D.DATE OF BIRTH:  August 29, 1921  DATE OF PROCEDURE: DATE OF DISCHARGE:                                PROGRESS NOTE   [name] is a patient of Dr. Alonza Smoker.  He came into the hospital with some increased chest discomfort.  He had been evaluated by Cardiology team and has been ambulating and did well.  He did not have any problems with ambulation.  He has not had any more chest pain, so he is ready for discharge.  PHYSICAL EXAMINATION:  VITAL SIGNS:  His temperature is 98.2, pulse 67, respirations 18, blood pressure 158/82, O2 sats 95% on room air. CHEST:  Relatively clear. HEART:  Regular. ABDOMEN:  Soft.  Cardiac panel all negative.  ASSESSMENT:  He is better.  PLAN:  Discharge him home.  Please see discharge summary for details.     Athan Casalino L. Juanetta Gosling, M.D.     ELH/MEDQ  D:  06/10/2010  T:  06/10/2010  Job:  [id number]  Electronically Signed by Kari Baars M.D. on 06/14/2010 08:25:56 AM

## 2010-06-14 NOTE — Discharge Summary (Signed)
NAME:  NITISH, ROES                 ACCOUNT NO.:  [id number]  MEDICAL RECORD NO.:  [id number]           PATIENT TYPE:  I  LOCATION:  A306                          FACILITY:  [facility]  PHYSICIAN:  Camber Ninh L. Juanetta Gosling, M.D.DATE OF BIRTH:  01/07/1922  DATE OF ADMISSION:  06/08/2010 DATE OF DISCHARGE:  LH                              DISCHARGE SUMMARY   FINAL DISCHARGE DIAGNOSES: 1. Chest pain, myocardial infarction ruled out. 2. Multivessel coronary artery occlusive disease. 3. Chronic obstructive pulmonary disease. 4. Infra-abdominal aortic aneurysm. 5. Hypertension. 6. Hyperlipidemia. 7. Previous history of stroke. 8. History of seizure disorder. 9. Gout. 10.Overactive bladder. 11.Hyponatremia.  HISTORY:  [name] is an 77 year old patient of Dr. Ouida Sills, who has known severe coronary artery disease, COPD, and multiple other medical problems as outlined above.  He had come to the hospital because he had chest tightness that he thought was angina.  Took 2 sublingual nitroglycerin tablets which caused his symptoms to resolve and then came to the emergency room.  He did not have any major electrocardiographic changes compared to his previous EKGs.  PHYSICAL EXAMINATION ON ADMISSION:  VITAL SIGNS:  He had a blood pressure of 183/101, temperature 97.5, heart rate was 67, respirations 20. HEENT:  His pupils were reactive.  Nose and throat were clear. NECK:  Supple without masses. HEART:  Regular without gallop. ABDOMEN:  Soft without any masses. NEUROLOGIC:  He was intact.  His white blood count was 5400, hemoglobin 13.6, hematocrit 39.1, platelets 146.  Sodium was low at 130, BUN was 11, creatinine 1.3. Cardiac panel was negative.  Chest x-ray showed bronchitic changes.  HOSPITAL COURSE:  He was admitted and had Cardiology consultation.  He ruled out for myocardial infarction.  He was advised to ambulate in the halls which he did and he did not have any further angina.  It was  felt that he was not a good candidate for any sort of revascularization because he has difficult anatomy and since he had improved clinically, it was elected to allow him to go home on Jun 10, 2010.  This had been discussed with him at length and Dr. Ouida Sills and then he was discharged.  He is discharged home on his regular medications and he now will have the addition of: 1. Amlodipine 5 mg 1/2 tablet daily, that dose may need to be     increased at some point. 2. Plavix 75 mg daily. 3. Isosorbide mononitrate 60 mg daily. 4. He had been on simvastatin 40 mg daily, but because of the starting     on amlodipine, I have changed him to pravastatin 40 mg daily. 5. Oxcarbazepine 300 mg 1 in the morning and 1.5 at night. 6. Flomax 0.4 mg daily. 7. Oxybutynin 5 mg b.i.d. 8. Spiriva 1 capsule inhaled daily. 9. Losartan 100 mg daily. 10.Famotidine 20 mg b.i.d. 11.Allopurinol 300 mg daily. 12.Metoprolol 25 mg daily. 13.Nasonex 2 sprays each nostril daily. 14.Aspirin 325 mg daily. 15.The new prescription for pravastatin.  He will follow with Dr. Ouida Sills in about 2 weeks.  He will follow up with his cardiologist and return if  he has any increasing chest pain, etc.     Riggins Cisek L. Juanetta Gosling, M.D.     ELH/MEDQ  D:  06/10/2010  T:  06/10/2010  Job:  [id number]  Electronically Signed by Kari Baars M.D. on 06/14/2010 08:25:53 AM

## 2010-07-02 NOTE — H&P (Signed)
NAME:  [name], [name] NO.:  [id number]  MEDICAL RECORD NO.:  [id number]           PATIENT TYPE:  LOCATION:                                 FACILITY:  PHYSICIAN:  [name], MD       DATE OF BIRTH:  24-Aug-1921  DATE OF ADMISSION: DATE OF DISCHARGE:  LH                             HISTORY & PHYSICAL   CHIEF COMPLAINT:  Chest pain.  HISTORY OF PRESENT ILLNESS:  This patient is an 77 year old white male with a history of coronary artery disease who presented to the emergency room with his daughter after awakening at 2:00 a.m. with tightness in his substernal chest area.  He felt pain up into his neck.  He felt hot but did not sweat.  He denies difficulty breathing or vomiting.  His took 2 nitroglycerin and his pain went away within approximately 15 minutes.  He went back to sleep and woke up with another episode of chest tightness at about 4:00 a.m.  He went back to sleep and then awakened around 6.  He waited for about an hour and called his daughter and then presented to the emergency room.  His initial cardiac markers were negative.  His EKG revealed no sign of acute infarction or ischemia.  The patient has a history of coronary heart disease and underwent a bare metal stent placement 2006 in his circumflex.  He has a totally occluded right coronary artery.  He had a followup cardiac catheterization in 2010 but no interventions were performed.  The patient does not smoke.  He has a history of hypertension, diabetes, and hyperlipidemia.  PAST MEDICAL HISTORY: 1. Coronary artery disease. 2. Lacunar stroke. 3. Diabetes. 4. Hypertension. 5. Hyperlipidemia. 6. Chronic kidney disease. 7. Seizures. 8. COPD. 9. BPH. 10.Abdominal aortic aneurysm. 11.Gout.  MEDICATIONS: 1. Aspirin 325 mg daily. 2. Plavix 75 mg daily. 3. Imdur 60 mg daily. 4. Cozaar 100 mg daily. 5. Metoprolol 25 mg daily. 6. Albuterol 2 puffs q.4 p.r.n. 7. Allopurinol 300 mg  daily. 8. Famotidine 20 mg b.i.d. 9. Nasonex 2 sprays each nostril daily. 10.Sublingual nitroglycerin p.r.n. 11.Trileptal 300 mg in the morning 450 mg at bedtime. 12.Ditropan 5 mg b.i.d. 13.Simvastatin 40 mg  nightly. 14.Spiriva 1 capsule daily.  ALLERGIES:  PENICILLIN.  SOCIAL HISTORY:  He does not use tobacco, alcohol, or recreational substances.  FAMILY HISTORY:  Father had a stroke.  His mother had a stroke.  His sister has had diabetes, heart disease, and hyperlipidemia.  REVIEW OF SYSTEMS:  He denied syncope, cough, fever, change in bowel habits, or increased difficulty voiding.  PHYSICAL EXAMINATION:  VITAL SIGNS:  Temperature 97.4, pulse 67, respirations 20, blood pressure 180/85, and oxygen saturation 97%. GENERAL:  Well-nourished, alert and in no distress. HEENT:  No scleral icterus.  Pharynx is moist. NECK:  Supple with no JVD or thyromegaly.  No carotid bruits. LUNGS:  Clear. HEART:  Regular with no murmurs. ABDOMEN:  Nontender with no hepatosplenomegaly.  No chest wall tenderness. EXTREMITIES:  No cyanosis, clubbing, or edema.  Pulses intact. NEURO:  Grossly intact. LYMPH NODES:  No  cervical or supraclavicular enlargement.  LABORATORY DATA:  Troponin-I less than 0.30, white count 5.4, hemoglobin 13.6, platelets 146,000.  Sodium 130.  Potassium 3.7, bicarb 25, glucose 111, BUN 11, creatinine 1.34, calcium 9.9.  Chest x-ray revealed mild bronchitic changes.  EKG reveals normal sinus rhythm with a left anterior hemiblock.  IMPRESSION/PLAN: 1. Chest pain with a history of coronary heart disease.  The patient     is being hospitalized for further evaluation and consultation with     Cardiology.  He will have serial cardiac enzymes and     electrocardiograms.  Continue medical therapy.  Add Lovenox. 2. Chronic obstructive pulmonary disease, stable.  Continue Spiriva     and p.r.n. albuterol. 3. Hypertension.  Continue current therapy. 4. Diabetes.  He has been  well controlled on nondrug therapy his     hemoglobin A1c was 6.4 in March. 5. Seizure disorder, continue Trileptal. 6. History of lacunar stroke. 7. Hyperlipidemia, continue simvastatin. 8. History of abdominal aortic aneurysm which is followed by Vascular     Surgery.     [name], MD     ROF/MEDQ  D:  06/09/2010  T:  06/09/2010  Job:  [id number]  Electronically Signed by Carylon Perches MD on 07/02/2010 10:43:40 AM

## 2010-07-04 ENCOUNTER — Encounter: Payer: Self-pay | Admitting: Critical Care Medicine

## 2010-07-04 ENCOUNTER — Ambulatory Visit (INDEPENDENT_AMBULATORY_CARE_PROVIDER_SITE_OTHER): Payer: Medicare Other | Admitting: Critical Care Medicine

## 2010-07-04 VITALS — BP 122/68 | HR 55 | Temp 98.0°F | Ht 71.5 in | Wt 194.4 lb

## 2010-07-04 DIAGNOSIS — J449 Chronic obstructive pulmonary disease, unspecified: Secondary | ICD-10-CM

## 2010-07-04 DIAGNOSIS — J209 Acute bronchitis, unspecified: Secondary | ICD-10-CM

## 2010-07-04 DIAGNOSIS — J4489 Other specified chronic obstructive pulmonary disease: Secondary | ICD-10-CM

## 2010-07-04 MED ORDER — AZITHROMYCIN 250 MG PO TABS: ORAL_TABLET | ORAL | Status: DC

## 2010-07-04 NOTE — Patient Instructions (Signed)
Take azithromycin two the first day then one daily until gone for 5 days total, sent to pharmacy Stay on spiriva daily No other medication changes Return 4 months

## 2010-07-04 NOTE — Progress Notes (Signed)
Subjective:    Patient ID: [name], male    DOB: July 30, 1921, 77 y.o.   MRN: [id number]  Shortness of Breath This is a chronic problem. The current episode started more than 1 year ago. The problem occurs daily (exertion only). The problem has been unchanged. Associated symptoms include chest pain, leg swelling and sputum production. Pertinent negatives include no hemoptysis, rhinorrhea, sore throat, syncope or wheezing. Associated symptoms comments: Chest pain is dull, comes and goes, occ if moves around.. His past medical history is significant for COPD.  Cough This is a chronic problem. The current episode started more than 1 year ago (much worse with coughing.  mucus is yellow). The problem has been gradually worsening. The problem occurs every few hours. The cough is productive of sputum (yellow mucus). Associated symptoms include chest pain and shortness of breath. Pertinent negatives include no heartburn, hemoptysis, nasal congestion, postnasal drip, rhinorrhea, sore throat or wheezing. Associated symptoms comments: Has sl left sided chest pain. His past medical history is significant for COPD.   04/11/10 F/u COPD primary emphysema Syncope d/t seizures from CVA.  No further pass out spells since seizure meds adjusted.  Pt with dyspnea and cough as above Pt stopped symbicort d/t throat irritation,  No evidence it helped anyway.  Last seen 11/11.  6/19 At last ov we stopped symbicort and started Spiriva. No real change with spiriva. Had sores on lip,  Would go off and on the spiriva. Recently in hosp for chest [facility] at [facility].     Review of Systems  HENT: Negative for sore throat, rhinorrhea and postnasal drip.   Respiratory: Positive for cough, sputum production and shortness of breath. Negative for hemoptysis and wheezing.   Cardiovascular: Positive for chest pain and leg swelling. Negative for syncope.  Gastrointestinal: Negative for heartburn.       Objective:   Physical  Exam  Gen: Pleasant, well-nourished, in no distress,  normal affect  ENT: No lesions,  mouth clear,  oropharynx clear, no postnasal drip  Neck: No JVD, no TMG, no carotid bruits  Lungs: No use of accessory muscles, no dullness to percussion, distant BS Cardiovascular: RRR, heart sounds normal, no murmur or gallops, no peripheral edema  Abdomen: soft and NT, no HSM,  BS normal  Musculoskeletal: No deformities, no cyanosis or clubbing  Neuro: alert, non focal  Skin: Warm, no lesions or rashes    No images are attached to the encounter.         Assessment & Plan:  [facility] Golds Stage I Copd with moderate peripheral airflow obstruction but preserved DLCO Failed symbicort d/t mouth sores, hx oral CA Improved with spiriva but needs to be more consistent in its use Plan Use spiriva daily    Outpatient Encounter Prescriptions as of 07/04/2010  Medication Sig Dispense Refill  . albuterol (PROAIR HFA) 108 (90 BASE) MCG/ACT inhaler 1-2 puffs every 4-6 hours as needed       . allopurinol (ZYLOPRIM) 300 MG tablet Take 300 mg by mouth daily.        Marland Kitchen amLODipine (NORVASC) 5 MG tablet 1/2 tablet once daily      . aspirin 325 MG tablet Take 325 mg by mouth daily.        . clopidogrel (PLAVIX) 75 MG tablet Take 75 mg by mouth daily.       . famotidine (PEPCID) 20 MG tablet Take 20 mg by mouth 2 (two) times daily.        Marland Kitchen  IMDUR 60 MG 24 hr tablet TAKE 1 TABLET BY MOUTH   ONCE A DAY.  30 each  3  . losartan (COZAAR) 100 MG tablet Take 100 mg by mouth daily.        . metoprolol tartrate (LOPRESSOR) 25 MG tablet Take 25 mg by mouth daily.        . mometasone (NASONEX) 50 MCG/ACT nasal spray Place 2 sprays into the nose daily as needed.       . Oxcarbazepine (TRILEPTAL) 300 MG tablet 1 tablet in am and 1 1/2 tablets at bedtime       . oxybutynin (DITROPAN) 5 MG tablet Take 5 mg by mouth 2 (two) times daily.       . pravastatin (PRAVACHOL) 40 MG tablet Daily at bedtime      . Tamsulosin  HCl (FLOMAX) 0.4 MG CAPS Once daily      . tiotropium (SPIRIVA) 18 MCG inhalation capsule Place 1 capsule (18 mcg total) into inhaler and inhale daily.  30 capsule  12  . azithromycin (ZITHROMAX) 250 MG tablet Take 2 tablets by mouth on day 1, followed by 1 tablet by mouth daily for 4 days. Take 2 times one dose then one daily  6 each  0  . nitroGLYCERIN (NITROSTAT) 0.4 MG SL tablet 1 tablet under tongue at onset of chest pain;you may repeat every 5 minutes for up to 3 doses.       Marland Kitchen DISCONTD: azithromycin (ZITHROMAX) 250 MG tablet Take 2 tablets by mouth on day 1, followed by 1 tablet by mouth daily for 4 days. Take two by mouth on day one then one daily for 4 more days  6 each  0  . DISCONTD: azithromycin (ZITHROMAX) 250 MG tablet Take 2 tablets by mouth on day 1, followed by 1 tablet by mouth daily for 4 days. Take two by mouth on day one then one daily for 4 more days  6 each  0  . DISCONTD: ZOCOR 40 MG tablet TAKE (1) TABLET BY MOUTH AT BEDTIME.  30 each  3

## 2010-07-05 ENCOUNTER — Ambulatory Visit (INDEPENDENT_AMBULATORY_CARE_PROVIDER_SITE_OTHER): Payer: Medicare Other | Admitting: Cardiology

## 2010-07-05 ENCOUNTER — Encounter: Payer: Self-pay | Admitting: Cardiology

## 2010-07-05 VITALS — BP 143/78 | HR 73 | Wt 191.0 lb

## 2010-07-05 DIAGNOSIS — I2510 Atherosclerotic heart disease of native coronary artery without angina pectoris: Secondary | ICD-10-CM

## 2010-07-05 NOTE — Progress Notes (Signed)
HPI [name] comes in today for further evaluation and management post hospitalization for chest discomfort. At that time it did sound like angina. His enzymes were negative. It was felt he was not a candidate for aggressive therapy. Medications were adjusted.  He is not having any angina now. He does carry nitroglycerin. We reinforced today on how to use it properly. His gait is unsteady and he lives alone. I worry about him falling. Fortunately, his daughter lives across the street. She is with him today. He has a home health nurse and is receiving physical therapy at home as well. Things are being monitored as well as possible.  Past Medical History  Diagnosis Date  . Gout   . Stroke 1993, 1995    chronic balance issues  . Hyperlipidemia   . Emphysema   . Hypertension   . CAD (coronary artery disease)     stent 2006  . Seizure disorder     Past Surgical History  Procedure Date  . Cardiac catheterization 08/23/2004    bare metal stenting of the proximal circumflex    Family History  Problem Relation Age of Onset  . Emphysema Brother   . Heart disease Sister   . Cancer Brother     lung  . Cancer Brother     throat  . Rheum arthritis Mother   . Rheum arthritis Father     History   Social History  . Marital Status: Widowed    Spouse Name: N/A    Number of Children: 2  . Years of Education: N/A   Occupational History  . Retired Garment/textile technologist   Social History Main Topics  . Smoking status: Former Smoker -- 0.5 packs/day for 50 years    Types: Cigarettes    Quit date: 01/15/1990  . Smokeless tobacco: Never Used  . Alcohol Use: No  . Drug Use: No  . Sexually Active: Not on file   Other Topics Concern  . Not on file   Social History Narrative  . No narrative on file    Allergies  Allergen Reactions  . Penicillins     Current Outpatient Prescriptions  Medication Sig Dispense Refill  . acetaminophen (TYLENOL) 500 MG tablet Take 500 mg by mouth every  6 (six) hours as needed.        Marland Kitchen albuterol (PROAIR HFA) 108 (90 BASE) MCG/ACT inhaler 1-2 puffs every 4-6 hours as needed       . allopurinol (ZYLOPRIM) 300 MG tablet Take 300 mg by mouth daily.        Marland Kitchen amLODipine (NORVASC) 5 MG tablet 1/2 tablet once daily      . aspirin 325 MG tablet Take 325 mg by mouth daily.        . bisacodyl (DULCOLAX) 5 MG EC tablet Take 5 mg by mouth daily as needed.        . clopidogrel (PLAVIX) 75 MG tablet Take 75 mg by mouth daily.       . famotidine (PEPCID) 20 MG tablet Take 20 mg by mouth 2 (two) times daily.        . IMDUR 60 MG 24 hr tablet TAKE 1 TABLET BY MOUTH   ONCE A DAY.  30 each  3  . losartan (COZAAR) 100 MG tablet Take 100 mg by mouth daily.        . metoprolol tartrate (LOPRESSOR) 25 MG tablet Take 25 mg by mouth daily.        Marland Kitchen  mometasone (NASONEX) 50 MCG/ACT nasal spray Place 2 sprays into the nose daily as needed.       . nitroGLYCERIN (NITROSTAT) 0.4 MG SL tablet 1 tablet under tongue at onset of chest pain;you may repeat every 5 minutes for up to 3 doses.       . Oxcarbazepine (TRILEPTAL) 300 MG tablet 1 tablet in am and 1 1/2 tablets at bedtime       . oxybutynin (DITROPAN) 5 MG tablet Take 5 mg by mouth 2 (two) times daily.       . pravastatin (PRAVACHOL) 40 MG tablet Daily at bedtime      . simvastatin (ZOCOR) 40 MG tablet Take 40 mg by mouth at bedtime.       . Tamsulosin HCl (FLOMAX) 0.4 MG CAPS Once daily      . tiotropium (SPIRIVA) 18 MCG inhalation capsule Place 1 capsule (18 mcg total) into inhaler and inhale daily.  30 capsule  12  . DISCONTD: azithromycin (ZITHROMAX) 250 MG tablet Take 2 tablets by mouth on day 1, followed by 1 tablet by mouth daily for 4 days. Take 2 times one dose then one daily  6 each  0    ROS Negative other than HPI.   PE General Appearance: well developed, well nourished in no acute distress, frail HEENT: symmetrical face, PERRLA, good dentition  Neck: no JVD, thyromegaly, or adenopathy, trachea  midline Chest: symmetric without deformity Cardiac: PMI non-displaced, RRR, normal S1, S2, no gallop or murmur Lung: clear to ausculation and percussion Vascular: all pulses full without bruits  Abdominal: nondistended, nontender, good bowel sounds, no HSM, no bruits Extremities: no cyanosis, clubbing or edema, no sign of DVT, no varicosities  Skin: normal color, no rashes Neuro: alert and oriented x 3, non-focal Pysch: normal affect  Filed Vitals:   07/05/10 1056  BP: 143/78  Pulse: 73  Weight: 191 lb (86.637 kg)    EKG  Labs and Studies Reviewed.   Lab Results  Component Value Date   WBC 5.4 06/08/2010   HGB 13.6 06/08/2010   HCT 39.1 06/08/2010   MCV 92.9 06/08/2010   PLT 146* 06/08/2010      Chemistry      Component Value Date/Time   NA 130* 06/08/2010 0737   K 3.7 06/08/2010 0737   CL 96 06/08/2010 0737   CO2 25 06/08/2010 0737   BUN 11 06/08/2010 0737   CREATININE 1.34 06/08/2010 0737      Component Value Date/Time   CALCIUM 9.9 06/08/2010 0737   ALKPHOS 45 01/10/2009 1306   AST 17 01/10/2009 1306   ALT 16 01/10/2009 1306       Lab Results  Component Value Date   CHOL 164 01/10/2009   Lab Results  Component Value Date   HDL 38 01/10/2009   Lab Results  Component Value Date   LDLCALC 101 01/10/2009   Lab Results  Component Value Date   TRIG 125 01/10/2009   No results found for this basename: CHOLHDL   No results found for this basename: HGBA1C   Lab Results  Component Value Date   ALT 16 01/10/2009   AST 17 01/10/2009   ALKPHOS 45 01/10/2009   No results found for this basename: TSH

## 2010-07-05 NOTE — Assessment & Plan Note (Signed)
Stable. Continue medical therapy. Not candidate for invasive workup. Discussed with family.

## 2010-07-05 NOTE — Patient Instructions (Signed)
**Note De-identified  Obfuscation** Your physician recommends that you continue on your current medications as directed. Please refer to the Current Medication list given to you today.  Your physician recommends that you schedule a follow-up appointment in: 1 year  

## 2010-07-05 NOTE — Assessment & Plan Note (Signed)
Golds Stage I Copd with moderate peripheral airflow obstruction but preserved DLCO Failed symbicort d/t mouth sores, hx oral CA Improved with spiriva but needs to be more consistent in its use Plan Use spiriva daily

## 2010-07-27 ENCOUNTER — Ambulatory Visit (HOSPITAL_COMMUNITY)
Admission: RE | Admit: 2010-07-27 | Discharge: 2010-07-27 | Disposition: A | Discharge status: Home / Self Care | Payer: Medicare Other | Source: Ambulatory Visit | Attending: Diagnostic Neuroimaging | Admitting: Diagnostic Neuroimaging

## 2010-07-27 DIAGNOSIS — R269 Unspecified abnormalities of gait and mobility: Secondary | ICD-10-CM | POA: Insufficient documentation

## 2010-07-27 DIAGNOSIS — IMO0001 Reserved for inherently not codable concepts without codable children: Secondary | ICD-10-CM | POA: Insufficient documentation

## 2010-07-27 DIAGNOSIS — M6281 Muscle weakness (generalized): Secondary | ICD-10-CM | POA: Insufficient documentation

## 2010-07-27 DIAGNOSIS — R262 Difficulty in walking, not elsewhere classified: Secondary | ICD-10-CM | POA: Insufficient documentation

## 2010-07-27 NOTE — Progress Notes (Signed)
Physical Therapy Evaluation  Patient Name: [name] [id number] Date: 07/27/2010 Time In: 1400 Time Out: 1500 Charges: 1 eval, 20 min PPT  History/Subjective Pt is an 77 y.o. Caucasian male referred to PT secondary to gait disturbances and difficulty with balance.  Pt reports that initally he saw Dr. Ouida Sills who then referred him to [facility].  Pt states "the least little things through me off balance and I feel like I am going to fall".  Denies recent falls.  Had home health physical therapy for improved safety at home and reports he was walking independently up and down his road.  He also reports that he sometimes uses a cane for home and community use.  Pt does not come into therapy with a cane today.  C/Co is difficulty with balance.  HPI: Symptoms/Limitations Symptoms: Difficulty walking with balance difficulty How long can you sit comfortably?: No difficulty  How long can you stand comfortably?: Some pain secondary to pain from OA, no difficulty may have some fatigue. How long can you walk comfortably?: Able to walk 1/4 of a mile without difficulty Independently.  Past Medical History:  Past Medical History  Diagnosis Date  . Gout   . Stroke 1993, 1995    chronic balance issues  . Hyperlipidemia   . Emphysema   . Hypertension   . CAD (coronary artery disease)     stent 2006  . Seizure disorder    Past Surgical History:  Past Surgical History  Procedure Date  . Cardiac catheterization 08/23/2004    bare metal stenting of the proximal circumflex   Prior Functioning  Home Living Type of Home: House Lives With: Alone Home Layout: One level Bathroom Shower/Tub: Tub/shower unit Prior Function Level of Independence: Independent with basic ADLs;Independent with homemaking with ambulation;Independent with gait;Independent with transfers Hobbies: Enjoys watching golf and going into the community with family.   Sensation/Coordination/Flexibility Sensation Light Touch:  Appears Intact Additional Comments: Flexibility Comment: (+ L FABER) Flexibility Obers: Positive (BLE) 90/90: Positive (BLE)  Assessment RLE Strength Right Hip Flexion: 5/5 Right Hip Extension: 4/5 Right Hip ABduction: 4/5 Right Hip ADduction: 5/5 Right Knee Flexion: 4/5 Right Knee Extension: 5/5 Right Ankle Dorsiflexion: 4/5 LLE Strength Left Hip Flexion: 4/5 Left Hip Extension: 3+/5 Left Hip ABduction: 4/5 Left Hip ADduction: 5/5 Left Knee Flexion: 4/5 Left Knee Extension: 4/5 Left Ankle Dorsiflexion: 4/5  Mobility (including Balance) Ambulation/Gait Ambulation/Gait: Yes Ambulation/Gait Assistance: 7: Independent Gait Pattern: Decreased step length - right;Decreased trunk rotation;Decreased stance time - left;Decreased weight shift to left Gait velocity: TUG: 12.06 sec.  Stairs: Yes Stairs Assistance: 6: Modified independent (Device/Increase time);Other (comment) Stair Management Technique: One rail Right;Two rails;Forwards;Other (comment) (1 to ascend w/recipriocal, 2 descend w/step to) Number of Stairs: 10  Height of Stairs: 6   Balance Balance Assessed: Yes Static Standing Balance Single Leg Stance - Right Leg: 3  Single Leg Stance - Left Leg: 1  Tandem Stance - Right Leg: 0  Tandem Stance - Left Leg: 0  Rhomberg - Eyes Closed: 10  Dynamic Standing Balance Standardized Balance Assessment: Dynamic Gait Index;Timed Up and Go Test (DGI: 15/24.  TUG: 12.06 sec) Berg Balance Test Sit to Stand: Able to stand  independently using hands Standing Unsupported: Able to stand safely 2 minutes Sitting with Back Unsupported but Feet Supported on Floor or Stool: Able to sit safely and securely 2 minutes Stand to Sit: Sits safely with minimal use of hands Transfers: Able to transfer safely, minor use of hands  Standing Unsupported with Eyes Closed: Able to stand 10 seconds safely Standing Ubsupported with Feet Together: Able to place feet together independently but unable to  hold for 30 seconds From Standing, Reach Forward with Outstretched Arm: Can reach confidently >25 cm (10") From Standing Position, Pick up Object from Floor: Able to pick up shoe, needs supervision From Standing Position, Turn to Look Behind Over each Shoulder: Looks behind from both sides and weight shifts well Turn 360 Degrees: Able to turn 360 degrees safely but slowly Standing Unsupported, Alternately Place Feet on Step/Stool: Able to stand independently and complete 8 steps >20 seconds Standing Unsupported, One Foot in Front: Able to take small step independently and hold 30 seconds Standing on One Leg: Tries to lift leg/unable to hold 3 seconds but remains standing independently Total Score: 44  5 STS: 15.4 sec.  Exercise/Treatments Hip Exercises Hip Extension: AROM;Both;10 reps (PRONE ) Additional Hip Exercises Mini-Sqauts: 10 reps Knee Exercises Hip Extension: AROM;Both;10 reps (PRONE ) Balance Exercises Tandem Stance: Right;Left;Other (comment)  Goals PT Short Term Goals Short Term Goal 1: Pt will be Independent in HEP in order to maximize therapeutic effect. Short Term Goal 2: Pt will improve tandem stance on static surface x30 seconds for improved balance and safety. Short Term Goal 3: Pt will improve Berg balance to 50/56 and DGI to 20/24 for improved independent safety in the community. Short Term Goal 4: Pt will increase LE flexibility in order to ambulate with appropriate gait mechanics.   End of Session Patient Active Problem List  Diagnoses  . HYPERLIPIDEMIA  . GOUT  . HYPERTENSION  . CORONARY ATHEROSCLEROSIS NATIVE CORONARY ARTERY  . STROKE  . CHRONIC RHINITIS  . EMPHYSEMA  . C O P D  . GERD  . CHEST PAIN  . Difficulty in walking  . Abnormality of gait   PT - End of Session Activity Tolerance: Patient tolerated treatment well PT Assessment and Plan Clinical Impression Statement: Pt is an 38. y.o male who is referred to PT secondary to difficulty walking  with mild dizziness.  After examiniation it was found that the patient has current body structure impairments including impaired balance, gait abnormalities , and decrease in percieved confidence with community ambulation which are limiting him in his ability to participate in community activities.  Pt will benefit from skilled outpatient physical therapy in order to address the above impairments in order to maximize function and improve safety.  Rehab Potential: Good PT Frequency: Min 2X/week PT Duration: Other (comment) (2 weeks) PT Treatment/Interventions: Gait training;Stair training;Functional mobility training;Therapeutic exercise;Patient/family education;Balance training;Neuromuscular re-education PT Plan: 2x/wk x 2 weeks    Nelli Swalley 07/27/2010, 4:06 PM

## 2010-07-27 NOTE — Patient Instructions (Addendum)
Educated patient on importance of balance safety.  Educated patient on role of PT and HEP.  Pt is understanding and agreeable to plan.

## 2010-08-01 ENCOUNTER — Ambulatory Visit (HOSPITAL_COMMUNITY)
Admission: RE | Admit: 2010-08-01 | Discharge: 2010-08-01 | Disposition: A | Discharge status: Home / Self Care | Payer: Medicare Other | Source: Ambulatory Visit | Attending: Internal Medicine | Admitting: Internal Medicine

## 2010-08-01 NOTE — Progress Notes (Signed)
Physical Therapy Treatment Patient Name: [name] Date: 08/01/2010  Time in:  1:08pm Time out: 1:40pm Visits #: 2 of 4  Charges: therex 26 minutes  Initial eval: 07/27/10 Re-eval due: 08/10/10  SUBJECTIVE:  Symptoms/Limitations Symptoms: No pain, just unsteadiness today. Pain Assessment Currently in Pain?: No/denies Multiple Pain Sites: No  Precautions/Restrictions :  None noted  OBJECTIVE:  Pt able to complete the following exercises bilaterally:   Warm-up: Recumbent bike 6 min. @ 2.0, seat on 11  Standing: Heelraises 10X    Toeraises 10X    Minisquats 10X    SLS max of 3:  R-10", L-4"    Tandem stance max of 3:  R leading 4", L leading 10"  Seated:  Sit-stand without UE support 5X  Prone:  Hip extension 10X    Knee flexion 10X  Sidelying: Hip abduction 10X  Supine: Bridge 10X    SLR 10X          Goals PT Short Term Goals Short Term Goal 1 Progress: Not met Short Term Goal 2 Progress: Not met Short Term Goal 4 Progress: Not met  End of Session Patient Active Problem List  Diagnoses  . HYPERLIPIDEMIA  . GOUT  . HYPERTENSION  . CORONARY ATHEROSCLEROSIS NATIVE CORONARY ARTERY  . STROKE  . CHRONIC RHINITIS  . EMPHYSEMA  . C O P D  . GERD  . CHEST PAIN  . Difficulty in walking  . Abnormality of gait   PT - End of Session Activity Tolerance: Patient tolerated treatment well General Behavior During Session: [facility] for tasks performed Cognition: [facility] for tasks performed PT Assessment and Plan Clinical Impression Statement: Added sit-stands and mat therex with good results.  Pt requires extra cues secondary to [facility] and inattention to task.  Only 1 LOB upon initial arrival but able to self-correct. PT Plan: Continue X 2 more visits then re-eval.  Add tandem and retro gait, sidestepping next visit to improve balance.l   Emeline Gins B 08/01/2010, 1:55 PM

## 2010-08-01 NOTE — Patient Instructions (Signed)
Pt instructed to fully lift LE's with ambulation and focus on making equal steps without shuffling.

## 2010-08-03 ENCOUNTER — Ambulatory Visit (HOSPITAL_COMMUNITY)
Admission: RE | Admit: 2010-08-03 | Discharge: 2010-08-03 | Disposition: A | Discharge status: Home / Self Care | Payer: Medicare Other | Source: Ambulatory Visit | Attending: Internal Medicine | Admitting: Internal Medicine

## 2010-08-03 NOTE — Progress Notes (Signed)
Physical Therapy Treatment Patient Name: [name] Date: 08/03/2010  Time In:  1:02 Time Out: 1:48 Visits: 3 out of 5 sessions Charge:  therex 25 min Neuro re-ed x 15 min  Subjective: Symptoms/Limitations Symptoms: No pain today  Objective: Pt with rigid hips at entrance.     Exercise/Treatments Warm-up: Recumbent bike 6 min. @ 2.0, seat on 11  Standing: Heelraises 10X  Toeraises 10X  Minisquats 10X  2 SLS max of 3: R-9 ", L-11"  Tandem stance max of 3: R leading 11", L leading 18"  Tandem 1RT Retro1RT Sidestep 1RT Seated: Sit-stand without UE support 5X  Prone: Hip extension 10X  Knee flexion 10X  Sidelying: Hip abduction 10X  Supine: Bridge 10X  SLR 10X Stability Exercises Heel Raises: 10 reps Lumbar Machine Exercises Stationary Bike: 6 min @2 .0, seat 11 Hip Exercises Knee Flexion: 10 reps (prone) Straight Leg Raises: 10 reps (supine) Hip Extension: 10 reps (prone) Hip ABduction/ADduction: 10 reps;Sidelying Bridges: 10 reps;Supine Additional Hip Exercises Mini-Sqauts:  (10 reps x 2) Stationary Bike: 6 min @2 .0, seat 11 Knee Exercises Knee Flexion: 10 reps (prone) Straight Leg Raises: 10 reps (supine) Heel Raises: 10 reps Hip Extension: 10 reps (prone) Hip ABduction/ADduction: 10 reps;Sidelying Bridges: 10 reps;Supine Ankle Exercises Heel Raises: 10 reps Toe Raise: 10 reps Balance Exercises Stationary Bike: 6 min @2 .0, seat 11 Sidestepping: 1 round trip Tandem Walking: 1 round trip Retro Gait: 1 round trip Single Limb Stance: Right;Left (Right 9"; left 11") Tandem Stance:  (Right leading 11", Left leading 18") Stand without Upper Extremity Support: 5X Heel Raises: 10 reps Toe Raise: 10 reps     PT - End of Session Equipment Utilized During Treatment: Gait belt Activity Tolerance: Patient tolerated treatment well General Behavior During Session: [facility] for tasks performed Cognition: [facility] for tasks performed PT Assessment and  Plan Clinical Impression Statement: Added tandem, retro, and side stepping for balance.  Pt required assistance to regain LOB and demonstration to perform task correctly.  vc required to slow down and for proper position with most ex. Rehab Potential: Good PT Frequency: Min 2X/week PT Plan: Re-eval next session, continue with current POC to improve balance. Add gait with SPC in arms for rotation B hip to normalize gait.  Juel Burrow 08/03/2010, 1:56 PM

## 2010-08-08 ENCOUNTER — Ambulatory Visit (HOSPITAL_COMMUNITY)
Admission: RE | Admit: 2010-08-08 | Discharge: 2010-08-08 | Disposition: A | Discharge status: Home / Self Care | Payer: Medicare Other | Source: Ambulatory Visit | Attending: Internal Medicine | Admitting: Internal Medicine

## 2010-08-08 NOTE — Progress Notes (Signed)
Physical Therapy Treatment Patient Name: [name] [id number] Date: 08/08/2010  Time In: 2:35 Time Out: 3:35 Visit #: 4 out of 6 Next Re-eval: Today Charge: Extremity MMT, BERG Balance Test, TUG, Sit-->stand x 50 min Gait x 5 min   Subjective: Symptoms/Limitations Symptoms: 4/10 R hip Pain Assessment Currently in Pain?: Yes Pain Score:   4 Pain Location: Hip Pain Orientation: Right "Not doing HEP as often as I should, I do stand on one leg in front of counter."  Objective: MMT Right Left  Hip flexion 5/5 initial eval 4+/5  Hip extension 3/5  4+/5  Hip abduction 4+/5 4+/5  Knee flexion 4+/5 5/5  Knee extension 5/5 initial eval 5/5  Dorsiflexion 5/5 5/5   TUG: 14.6 sec (was 12.06 initial eval)  Sit--> Stand 9.6 (15.4)  Dynamic Standing Balance Standardized Balance Assessment: Berg Balance Test Berg Balance Test Sit to Stand: Able to stand without using hands and stabilize independently Standing Unsupported: Able to stand safely 2 minutes Sitting with Back Unsupported but Feet Supported on Floor or Stool: Able to sit safely and securely 2 minutes Stand to Sit: Sits safely with minimal use of hands Transfers: Able to transfer safely, minor use of hands Standing Unsupported with Eyes Closed: Able to stand 10 seconds safely Standing Ubsupported with Feet Together: Able to place feet together independently and stand for 1 minute with supervision From Standing, Reach Forward with Outstretched Arm: Can reach forward >12 cm safely (5") From Standing Position, Pick up Object from Floor: Able to pick up shoe safely and easily From Standing Position, Turn to Look Behind Over each Shoulder: Looks behind from both sides and weight shifts well Turn 360 Degrees: Able to turn 360 degrees safely in 4 seconds or less Standing Unsupported, Alternately Place Feet on Step/Stool: Able to stand independently and safely and complete 8 steps in 20 seconds Standing Unsupported, One Foot in  Front: Able to take small step independently and hold 30 seconds Standing on One Leg: Able to lift leg independently and hold 5-10 seconds Total Score: 51  Timed Up and Go Test TUG: Normal TUG Normal TUG (seconds): 14.6   Exercise/Treatments  Re-assessment complete, see objective measurements.    Goals   End of Session Patient Active Problem List  Diagnoses  . HYPERLIPIDEMIA  . GOUT  . HYPERTENSION  . CORONARY ATHEROSCLEROSIS NATIVE CORONARY ARTERY  . STROKE  . CHRONIC RHINITIS  . EMPHYSEMA  . C O P D  . GERD  . CHEST PAIN  . Difficulty in walking  . Abnormality of gait   PT - End of Session Activity Tolerance: Patient tolerated treatment well General Behavior During Session: [facility] for tasks performed Cognition: [facility] for tasks performed PT Assessment and Plan Clinical Impression Statement: Re-assessment complete, see objective measurements for findings. PT Plan: D/C to HEP per Annett Fabian, PT and pt wishes.  Juel Burrow 08/08/2010, 3:48 PM

## 2010-08-09 ENCOUNTER — Inpatient Hospital Stay (HOSPITAL_COMMUNITY)
Admission: RE | Admit: 2010-08-09 | Discharge: 2010-08-09 | Payer: Medicare Other | Source: Ambulatory Visit | Attending: Physical Therapy | Admitting: Physical Therapy

## 2010-09-01 ENCOUNTER — Other Ambulatory Visit: Payer: Self-pay | Admitting: *Deleted

## 2010-09-01 MED ORDER — FAMOTIDINE 20 MG PO TABS: 20.0000 mg | ORAL_TABLET | Freq: Two times a day (BID) | ORAL | Status: DC

## 2010-10-04 ENCOUNTER — Other Ambulatory Visit: Payer: Self-pay | Admitting: Cardiology

## 2010-10-13 LAB — URINALYSIS, ROUTINE W REFLEX MICROSCOPIC
Bilirubin Urine: NEGATIVE
Ketones, ur: NEGATIVE
Nitrite: NEGATIVE
Specific Gravity, Urine: 1.01
Urobilinogen, UA: 0.2

## 2010-10-13 LAB — DIFFERENTIAL
Basophils Absolute: 0
Lymphocytes Relative: 23
Neutro Abs: 3.3
Neutrophils Relative %: 63

## 2010-10-13 LAB — BASIC METABOLIC PANEL
BUN: 17
Calcium: 9.4
GFR calc non Af Amer: 41 — ABNORMAL LOW
Glucose, Bld: 126 — ABNORMAL HIGH

## 2010-10-13 LAB — HEPATIC FUNCTION PANEL
AST: 19
Albumin: 3.6
Bilirubin, Direct: 0.2
Total Protein: 6.4

## 2010-10-13 LAB — AMMONIA: Ammonia: <8 — ABNORMAL LOW

## 2010-10-13 LAB — CBC
Platelets: 216
RDW: 15.3

## 2010-11-13 ENCOUNTER — Encounter: Payer: Self-pay | Admitting: Critical Care Medicine

## 2010-11-13 ENCOUNTER — Ambulatory Visit (INDEPENDENT_AMBULATORY_CARE_PROVIDER_SITE_OTHER): Payer: Medicare Other | Admitting: Critical Care Medicine

## 2010-11-13 DIAGNOSIS — J449 Chronic obstructive pulmonary disease, unspecified: Secondary | ICD-10-CM

## 2010-11-13 NOTE — Assessment & Plan Note (Signed)
Golds Stage I Copd with moderate peripheral airflow obstruction but preserved DLCO Failed symbicort d/t mouth sores, hx oral CA Stable chronic obstructive lung disease with primary emphysematous component Plan Continued Spiriva Return 6 months

## 2010-11-13 NOTE — Patient Instructions (Signed)
No change in medications. Return in        6 months        

## 2010-11-13 NOTE — Progress Notes (Signed)
Subjective:    Patient ID: [name], male    DOB: 10/29/1921, 77 y.o.   MRN: [id number]  Shortness of Breath This is a chronic problem. The current episode started more than 1 year ago. The problem occurs daily (exertion only). The problem has been unchanged. Associated symptoms include chest pain, leg swelling and sputum production. Pertinent negatives include no hemoptysis, rhinorrhea, sore throat, syncope or wheezing. Associated symptoms comments: Chest pain is dull, comes and goes, occ if moves around.. His past medical history is significant for COPD.  Cough This is a chronic problem. The current episode started more than 1 year ago (much worse with coughing.  mucus is yellow). The problem has been gradually worsening. The problem occurs every few hours. The cough is productive of sputum (yellow mucus). Associated symptoms include chest pain and shortness of breath. Pertinent negatives include no heartburn, hemoptysis, nasal congestion, postnasal drip, rhinorrhea, sore throat or wheezing. Associated symptoms comments: Has sl left sided chest pain. His past medical history is significant for COPD.   04/11/10 F/u COPD primary emphysema Syncope d/t seizures from CVA.  No further pass out spells since seizure meds adjusted.  Pt with dyspnea and cough as above Pt stopped symbicort d/t throat irritation,  No evidence it helped anyway.  Last seen 11/11.  6/19 At last ov we stopped symbicort and started Spiriva. No real change with spiriva. Had sores on lip,  Would go off and on the spiriva. Recently in hosp for chest pain at [facility].    11/13/2010 Staying on spiriva.  No real mouthsores are noted.  Notes dyspnea in the am, better as day goes on.  Min mucus in the am, clear.    Review of Systems  HENT: Negative for sore throat, rhinorrhea and postnasal drip.   Respiratory: Positive for cough, sputum production and shortness of breath. Negative for hemoptysis and wheezing.     Cardiovascular: Positive for chest pain and leg swelling. Negative for syncope.  Gastrointestinal: Negative for heartburn.       Objective:   Physical Exam BP 132/72  Pulse 63  Temp(Src) 97.8 F (36.6 C) (Oral)  Ht 5' 11.5" (1.816 m)  Wt 192 lb 3.2 oz (87.181 kg)  BMI 26.43 kg/m2  SpO2 96%  Gen: Pleasant, well-nourished, in no distress,  normal affect  ENT: No lesions,  mouth clear,  oropharynx clear, no postnasal drip  Neck: No JVD, no TMG, no carotid bruits  Lungs: No use of accessory muscles, no dullness to percussion, distant BS Cardiovascular: RRR, heart sounds normal, no murmur or gallops, no peripheral edema  Abdomen: soft and NT, no HSM,  BS normal  Musculoskeletal: No deformities, no cyanosis or clubbing  Neuro: alert, non focal  Skin: Warm, no lesions or rashes    No images are attached to the encounter.         Assessment & Plan:  C O P D Golds Stage I Copd with moderate peripheral airflow obstruction but preserved DLCO Failed symbicort d/t mouth sores, hx oral CA Stable chronic obstructive lung disease with primary emphysematous component Plan Continued Spiriva Return 6 months    Outpatient Encounter Prescriptions as of 11/13/2010  Medication Sig Dispense Refill  . acetaminophen (TYLENOL) 500 MG tablet Take 500 mg by mouth every 6 (six) hours as needed.        Marland Kitchen albuterol (PROAIR HFA) 108 (90 BASE) MCG/ACT inhaler 1-2 puffs every 4-6 hours as needed       . allopurinol (  ZYLOPRIM) 300 MG tablet Take 300 mg by mouth daily.        Marland Kitchen amLODipine (NORVASC) 5 MG tablet 1/2 tablet once daily      . aspirin 325 MG tablet Take 325 mg by mouth daily.        . bisacodyl (DULCOLAX) 5 MG EC tablet Take 5 mg by mouth daily as needed.        . clopidogrel (PLAVIX) 75 MG tablet Take 75 mg by mouth daily.       . famotidine (PEPCID) 20 MG tablet Take 1 tablet (20 mg total) by mouth 2 (two) times daily.  60 tablet  6  . IMDUR 60 MG 24 hr tablet TAKE 1 TABLET  BY MOUTH   ONCE A DAY.  30 each  6  . losartan (COZAAR) 100 MG tablet Take 100 mg by mouth daily.        . metoprolol tartrate (LOPRESSOR) 25 MG tablet Take 25 mg by mouth daily.        . nitroGLYCERIN (NITROSTAT) 0.4 MG SL tablet 1 tablet under tongue at onset of chest pain;you may repeat every 5 minutes for up to 3 doses.       . Oxcarbazepine (TRILEPTAL) 300 MG tablet 1 tablet in am and 1 1/2 tablets at bedtime       . oxybutynin (DITROPAN) 5 MG tablet Take 5 mg by mouth 2 (two) times daily.       . pravastatin (PRAVACHOL) 40 MG tablet Daily at bedtime      . prednisoLONE acetate (PRED FORTE) 1 % ophthalmic suspension       . Tamsulosin HCl (FLOMAX) 0.4 MG CAPS Once daily      . tiotropium (SPIRIVA) 18 MCG inhalation capsule Place 1 capsule (18 mcg total) into inhaler and inhale daily.  30 capsule  12  . DISCONTD: simvastatin (ZOCOR) 40 MG tablet Take 40 mg by mouth at bedtime.       . mometasone (NASONEX) 50 MCG/ACT nasal spray Place 2 sprays into the nose daily as needed.

## 2010-11-17 ENCOUNTER — Other Ambulatory Visit: Payer: Self-pay | Admitting: Cardiology

## 2010-12-08 ENCOUNTER — Encounter: Payer: Self-pay | Admitting: Cardiology

## 2010-12-08 ENCOUNTER — Ambulatory Visit (INDEPENDENT_AMBULATORY_CARE_PROVIDER_SITE_OTHER): Payer: Medicare Other | Admitting: Cardiology

## 2010-12-08 VITALS — BP 128/71 | HR 64 | Ht 71.0 in | Wt 184.0 lb

## 2010-12-08 DIAGNOSIS — I2510 Atherosclerotic heart disease of native coronary artery without angina pectoris: Secondary | ICD-10-CM

## 2010-12-08 DIAGNOSIS — I951 Orthostatic hypotension: Secondary | ICD-10-CM

## 2010-12-08 MED ORDER — NITROGLYCERIN 0.4 MG SL SUBL: 0.4000 mg | SUBLINGUAL_TABLET | SUBLINGUAL | Status: DC | PRN

## 2010-12-08 NOTE — Assessment & Plan Note (Signed)
Stable. No change in treatment. 

## 2010-12-08 NOTE — Progress Notes (Signed)
HPI [name] comes in today for followup of his coronary artery disease. He rarely has any angina but on occasion has chest tightness in his right chest. He had this several days ago which his son didn't know about. He took one nitroglycerin which was old but seemed to help. His discomfort was not prolonged. He is not having any exertional angina.  His biggest complaint is dizziness if he stands up too quickly. He almost fell the other day. He is bad to get up quickly according to his son.  He denies any orthopnea, PND or edema. He does very well also in the 74  Past Medical History  Diagnosis Date  . Gout   . Stroke 1993, 1995    chronic balance issues  . Hyperlipidemia   . Emphysema   . Hypertension   . CAD (coronary artery disease)     stent 2006  . Seizure disorder     Current Outpatient Prescriptions  Medication Sig Dispense Refill  . acetaminophen (TYLENOL) 500 MG tablet Take 500 mg by mouth every 6 (six) hours as needed.        Marland Kitchen albuterol (PROAIR HFA) 108 (90 BASE) MCG/ACT inhaler 1-2 puffs every 4-6 hours as needed       . allopurinol (ZYLOPRIM) 300 MG tablet Take 300 mg by mouth daily.        Marland Kitchen amLODipine (NORVASC) 5 MG tablet 1/2 tablet once daily      . aspirin 325 MG tablet Take 325 mg by mouth daily.        . bisacodyl (DULCOLAX) 5 MG EC tablet Take 5 mg by mouth daily as needed.        . clopidogrel (PLAVIX) 75 MG tablet Take 75 mg by mouth daily.       Marland Kitchen COZAAR 100 MG tablet TAKE 1 TABLET BY MOUTH ONCE A DAY.  30 each  6  . famotidine (PEPCID) 20 MG tablet Take 1 tablet (20 mg total) by mouth 2 (two) times daily.  60 tablet  6  . IMDUR 60 MG 24 hr tablet TAKE 1 TABLET BY MOUTH   ONCE A DAY.  30 each  6  . metoprolol tartrate (LOPRESSOR) 25 MG tablet Take 25 mg by mouth daily.        . nitroGLYCERIN (NITROSTAT) 0.4 MG SL tablet Place 1 tablet (0.4 mg total) under the tongue every 5 (five) minutes as needed for chest pain. 1 tablet under tongue at onset of chest  pain;you may repeat every 5 minutes for up to 3 doses.  30 tablet  6  . Oxcarbazepine (TRILEPTAL) 300 MG tablet 1 tablet in am and 1 1/2 tablets at bedtime       . oxybutynin (DITROPAN) 5 MG tablet Take 5 mg by mouth 2 (two) times daily.       . pravastatin (PRAVACHOL) 40 MG tablet Daily at bedtime      . Tamsulosin HCl (FLOMAX) 0.4 MG CAPS Once daily      . tiotropium (SPIRIVA) 18 MCG inhalation capsule Place 1 capsule (18 mcg total) into inhaler and inhale daily.  30 capsule  12    Allergies  Allergen Reactions  . Penicillins     itching    Family History  Problem Relation Age of Onset  . Emphysema Brother   . Heart disease Sister   . Cancer Brother     lung  . Cancer Brother     throat  . Rheum arthritis Mother   .  Rheum arthritis Father     History   Social History  . Marital Status: Widowed    Spouse Name: N/A    Number of Children: 2  . Years of Education: N/A   Occupational History  . Retired Garment/textile technologist   Social History Main Topics  . Smoking status: Former Smoker -- 0.5 packs/day for 50 years    Types: Cigarettes    Quit date: 01/15/1990  . Smokeless tobacco: Never Used  . Alcohol Use: No  . Drug Use: No  . Sexually Active: Not on file   Other Topics Concern  . Not on file   Social History Narrative  . No narrative on file    ROS ALL NEGATIVE EXCEPT THOSE NOTED IN HPI  PE Note that he is mildly orthostatic. General Appearance: well developed, well nourished in no acute distress, looks much younger than stated age HEENT: symmetrical face, PERRLA, good dentition  Neck: no JVD, thyromegaly, or adenopathy, trachea midline Chest: symmetric without deformity Cardiac: PMI non-displaced, RRR, normal S1, S2, no gallop or murmur Lung: clear to ausculation and percussion Vascular: all pulses full without bruits  Abdominal: nondistended, nontender, good bowel sounds, no HSM, no bruits Extremities: no cyanosis, clubbing or edema, no sign of DVT,  no varicosities  Skin: normal color, no rashes Neuro: alert and oriented x 3, non-focal Pysch: normal affect  EKG Sinus bradycardia at 59 beats per minute with first-degree AV block, left anterior fascicular block., no acute changes BMET    Component Value Date/Time   NA 130* 06/08/2010 0737   K 3.7 06/08/2010 0737   CL 96 06/08/2010 0737   CO2 25 06/08/2010 0737   GLUCOSE 111* 06/08/2010 0737   BUN 11 06/08/2010 0737   CREATININE 1.34 06/08/2010 0737   CALCIUM 9.9 06/08/2010 0737   GFRNONAA 50* 06/08/2010 0737   GFRAA  Value: >60        The eGFR has been calculated using the MDRD equation. This calculation has not been validated in all clinical situations. eGFR's persistently <60 mL/min signify possible Chronic Kidney Disease. 06/08/2010 0737    Lipid Panel     Component Value Date/Time   CHOL 164 01/10/2009 1306   TRIG 125 01/10/2009 1306   HDL 38 01/10/2009 1306   LDLCALC 101 01/10/2009 1306    CBC    Component Value Date/Time   WBC 5.4 06/08/2010 0737   RBC 4.21* 06/08/2010 0737   HGB 13.6 06/08/2010 0737   HCT 39.1 06/08/2010 0737   PLT 146* 06/08/2010 0737   MCV 92.9 06/08/2010 0737   MCH 32.3 06/08/2010 0737   MCHC 34.8 06/08/2010 0737   RDW 14.0 06/08/2010 0737   LYMPHSABS 1.5 06/08/2010 0737   MONOABS 0.6 06/08/2010 0737   EOSABS 0.3 06/08/2010 0737   BASOSABS 0.0 06/08/2010 0737

## 2010-12-08 NOTE — Patient Instructions (Signed)
Your physician recommends that you schedule a follow-up appointment in: 12 months.  

## 2010-12-08 NOTE — Assessment & Plan Note (Signed)
4 suspect cautions reviewed at length. If he continues to have orthostasis, we could discontinue his low-dose metoprolol.

## 2011-02-12 ENCOUNTER — Encounter: Payer: Self-pay | Admitting: Vascular Surgery

## 2011-02-13 ENCOUNTER — Encounter: Payer: Self-pay | Admitting: Vascular Surgery

## 2011-02-13 ENCOUNTER — Ambulatory Visit (INDEPENDENT_AMBULATORY_CARE_PROVIDER_SITE_OTHER): Payer: Medicare Other | Admitting: Vascular Surgery

## 2011-02-13 ENCOUNTER — Encounter (INDEPENDENT_AMBULATORY_CARE_PROVIDER_SITE_OTHER): Payer: Medicare Other | Admitting: Vascular Surgery

## 2011-02-13 VITALS — BP 178/100 | HR 66 | Resp 20 | Ht 71.5 in | Wt 178.0 lb

## 2011-02-13 DIAGNOSIS — I714 Abdominal aortic aneurysm, without rupture, unspecified: Secondary | ICD-10-CM | POA: Insufficient documentation
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
## 2011-02-15 NOTE — Procedures (Unsigned)
DUPLEX ULTRASOUND OF ABDOMINAL AORTA  INDICATION:  Abdominal aortic aneurysm  HISTORY: Diabetes:  No Cardiac:  MI, CAD, stent Hypertension:  Yes Smoking:  No Connective Tissue Disorder: Family History:  No Previous Surgery:  No  DUPLEX EXAM:         AP (cm)                   TRANSVERSE (cm) Proximal Mid                  2.01 cm/4.85 cm           1.92 cm/5.01 cm Distal               4.19 cm                   4.12 cm Right Iliac          1.92 cm-P/2.01-D cm       1.69 cm-P/1.97-D cm Left Iliac           1.22 cm                   1.29 cm  PREVIOUS:  Date:  01/24/2010  AP:  4.1  TRANSVERSE:  4.0  IMPRESSION: 1. Technically difficult and unable to visualize the proximal aorta     due to vessel tortuosity and acoustic shadowing. 2. Infrarenal abdominal aortic bi-lobed aneurysm measuring 4.85 cm x     5.01 cm involving the first lobe and 4.19 cm x 4.12 cm at the     second lobe.  No intramural thrombus is identified. 3. 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 4. The left common iliac artery was of normal diameter. 5. Increase in aneurysmal dilatation since previous study on     01/24/2010.        ___________________________________________ [name], M.D.  SH/MEDQ  D:  02/13/2011  T:  02/13/2011  Job:  [id number]

## 2011-05-01 ENCOUNTER — Other Ambulatory Visit: Payer: Self-pay | Admitting: Critical Care Medicine

## 2011-05-04 ENCOUNTER — Other Ambulatory Visit: Payer: Self-pay | Admitting: Critical Care Medicine

## 2011-05-04 MED ORDER — FAMOTIDINE 20 MG PO TABS: 20.0000 mg | ORAL_TABLET | Freq: Two times a day (BID) | ORAL | Status: DC

## 2011-05-07 ENCOUNTER — Other Ambulatory Visit: Payer: Self-pay | Admitting: *Deleted

## 2011-05-07 DIAGNOSIS — Z48812 Encounter for surgical aftercare following surgery on the circulatory system: Secondary | ICD-10-CM

## 2011-05-07 DIAGNOSIS — I714 Abdominal aortic aneurysm, without rupture: Secondary | ICD-10-CM

## 2011-05-21 ENCOUNTER — Ambulatory Visit: Payer: Medicare Other | Admitting: Critical Care Medicine

## 2011-05-23 ENCOUNTER — Other Ambulatory Visit: Payer: Self-pay | Admitting: Critical Care Medicine

## 2011-06-14 ENCOUNTER — Other Ambulatory Visit: Payer: Self-pay | Admitting: *Deleted

## 2011-06-14 MED ORDER — ISOSORBIDE MONONITRATE ER 60 MG PO TB24: 60.0000 mg | ORAL_TABLET | Freq: Every day | ORAL | Status: DC

## 2011-06-18 ENCOUNTER — Other Ambulatory Visit: Payer: Self-pay | Admitting: Cardiology

## 2011-07-04 ENCOUNTER — Encounter: Payer: Self-pay | Admitting: Critical Care Medicine

## 2011-07-04 ENCOUNTER — Ambulatory Visit (INDEPENDENT_AMBULATORY_CARE_PROVIDER_SITE_OTHER): Payer: Medicare Other | Admitting: Critical Care Medicine

## 2011-07-04 VITALS — BP 122/54 | HR 75 | Temp 97.8°F | Ht 71.5 in | Wt 186.2 lb

## 2011-07-04 DIAGNOSIS — J438 Other emphysema: Secondary | ICD-10-CM

## 2011-07-04 MED ORDER — TIOTROPIUM BROMIDE MONOHYDRATE 18 MCG IN CAPS: 18.0000 ug | ORAL_CAPSULE | Freq: Every day | RESPIRATORY_TRACT | Status: DC

## 2011-07-04 NOTE — Patient Instructions (Signed)
No change in medications. Return in        6 months        

## 2011-07-04 NOTE — Assessment & Plan Note (Signed)
Chronic obstructive lung disease gold stage C. stable at this time Plan Maintain Spiriva daily Return 6 months

## 2011-07-04 NOTE — Progress Notes (Signed)
Subjective:    Patient ID: [name], male    DOB: 13-May-1921, 78 y.o.   MRN: [id number]  HPI  07/04/2011 Since last ov, worse issue is the in the AM,  When takes med is better.  Not able to get much done Notes some mucus, color is beige.  No real chest pain. Notes some heartburn. Pt denies any significant sore throat, nasal congestion or excess secretions, fever, chills, sweats, unintended weight loss, pleurtic or exertional chest pain, orthopnea PND, or leg swelling Pt denies any increase in rescue therapy over baseline, denies waking up needing it or having any early am or nocturnal exacerbations of coughing/wheezing/or dyspnea. Pt also denies any obvious fluctuation in symptoms with  weather or environmental change or other alleviating or aggravating factors  Past Medical History  Diagnosis Date  . Gout   . Stroke 1993, 1995    chronic balance issues  . Hyperlipidemia   . Emphysema   . Hypertension   . CAD (coronary artery disease)     stent 2006  . Seizure disorder      Family History  Problem Relation Age of Onset  . Emphysema Brother   . Heart disease Sister   . Cancer Brother     lung  . Cancer Brother     throat  . Rheum arthritis Mother   . Rheum arthritis Father      History   Social History  . Marital Status: Widowed    Spouse Name: N/A    Number of Children: 2  . Years of Education: N/A   Occupational History  . Retired Garment/textile technologist   Social History Main Topics  . Smoking status: Former Smoker -- 0.3 packs/day for 50 years    Types: Cigarettes    Quit date: 01/15/1990  . Smokeless tobacco: Never Used  . Alcohol Use: No  . Drug Use: No  . Sexually Active: Not on file   Other Topics Concern  . Not on file   Social History Narrative  . No narrative on file     Allergies  Allergen Reactions  . Penicillins     itching     Outpatient Prescriptions Prior to Visit  Medication Sig Dispense Refill  . acetaminophen (TYLENOL) 500  MG tablet Take 500 mg by mouth every 6 (six) hours as needed.        Marland Kitchen allopurinol (ZYLOPRIM) 300 MG tablet Take 300 mg by mouth daily.        Marland Kitchen amLODipine (NORVASC) 5 MG tablet 1/2 tablet once daily      . aspirin 325 MG tablet Take 325 mg by mouth daily.        . bisacodyl (DULCOLAX) 5 MG EC tablet Take 5 mg by mouth daily as needed.        . clopidogrel (PLAVIX) 75 MG tablet Take 75 mg by mouth daily.       Marland Kitchen COZAAR 100 MG tablet TAKE 1 TABLET BY MOUTH ONCE A DAY.  30 each  6  . donepezil (ARICEPT) 10 MG tablet Take 5 mg by mouth at bedtime.       . famotidine (PEPCID) 20 MG tablet Take 1 tablet (20 mg total) by mouth 2 (two) times daily.  60 tablet  1  . isosorbide mononitrate (IMDUR) 60 MG 24 hr tablet Take 1 tablet (60 mg total) by mouth daily.  30 tablet  11  . metoprolol tartrate (LOPRESSOR) 25 MG tablet Take 25  mg by mouth daily.        . nitroGLYCERIN (NITROSTAT) 0.4 MG SL tablet Place 1 tablet (0.4 mg total) under the tongue every 5 (five) minutes as needed for chest pain. 1 tablet under tongue at onset of chest pain;you may repeat every 5 minutes for up to 3 doses.  30 tablet  6  . Oxcarbazepine (TRILEPTAL) 300 MG tablet 1 tablet in am and 1 1/2 tablets at bedtime       . oxybutynin (DITROPAN) 5 MG tablet Take 5 mg by mouth 2 (two) times daily.       . pravastatin (PRAVACHOL) 40 MG tablet Daily at bedtime      . Tamsulosin HCl (FLOMAX) 0.4 MG CAPS Once daily      . SPIRIVA HANDIHALER 18 MCG inhalation capsule PLACE 1 CAPSULE INTO INHALER AND INHALE ONCE DAILY  30 each  2  . albuterol (PROAIR HFA) 108 (90 BASE) MCG/ACT inhaler 1-2 puffs every 4-6 hours as needed            Review of Systems 11 point review of systems was reviewed and is unremarkable except for history present    Objective:   Physical Exam BP 122/54  Pulse 75  Temp 97.8 F (36.6 C) (Oral)  Ht 5' 11.5" (1.816 m)  Wt 186 lb 3.2 oz (84.46 kg)  BMI 25.61 kg/m2  SpO2 92%  Gen: Pleasant, well-nourished, in no  distress,  normal affect  ENT: No lesions,  mouth clear,  oropharynx clear, no postnasal drip  Neck: No JVD, no TMG, no carotid bruits  Lungs: No use of accessory muscles, no dullness to percussion, distant BS  Cardiovascular: RRR, heart sounds normal, no murmur or gallops, no peripheral edema  Abdomen: soft and NT, no HSM,  BS normal  Musculoskeletal: No deformities, no cyanosis or clubbing  Neuro: alert, non focal  Skin: Warm, no lesions or rashes    No images are attached to the encounter.         Assessment & Plan:  EMPHYSEMA Chronic obstructive lung disease gold stage C. stable at this time Plan Maintain Spiriva daily Return 6 months   Outpatient Encounter Prescriptions as of 07/04/2011  Medication Sig Dispense Refill  . acetaminophen (TYLENOL) 500 MG tablet Take 500 mg by mouth every 6 (six) hours as needed.        Marland Kitchen allopurinol (ZYLOPRIM) 300 MG tablet Take 300 mg by mouth daily.        Marland Kitchen amLODipine (NORVASC) 5 MG tablet 1/2 tablet once daily      . aspirin 325 MG tablet Take 325 mg by mouth daily.        . bisacodyl (DULCOLAX) 5 MG EC tablet Take 5 mg by mouth daily as needed.        . clopidogrel (PLAVIX) 75 MG tablet Take 75 mg by mouth daily.       Marland Kitchen COZAAR 100 MG tablet TAKE 1 TABLET BY MOUTH ONCE A DAY.  30 each  6  . donepezil (ARICEPT) 10 MG tablet Take 5 mg by mouth at bedtime.       . famotidine (PEPCID) 20 MG tablet Take 1 tablet (20 mg total) by mouth 2 (two) times daily.  60 tablet  1  . isosorbide mononitrate (IMDUR) 60 MG 24 hr tablet Take 1 tablet (60 mg total) by mouth daily.  30 tablet  11  . metoprolol tartrate (LOPRESSOR) 25 MG tablet Take 25 mg by mouth daily.        Marland Kitchen  nitroGLYCERIN (NITROSTAT) 0.4 MG SL tablet Place 1 tablet (0.4 mg total) under the tongue every 5 (five) minutes as needed for chest pain. 1 tablet under tongue at onset of chest pain;you may repeat every 5 minutes for up to 3 doses.  30 tablet  6  . Oxcarbazepine (TRILEPTAL)  300 MG tablet 1 tablet in am and 1 1/2 tablets at bedtime       . oxybutynin (DITROPAN) 5 MG tablet Take 5 mg by mouth 2 (two) times daily.       . pravastatin (PRAVACHOL) 40 MG tablet Daily at bedtime      . Tamsulosin HCl (FLOMAX) 0.4 MG CAPS Once daily      . tiotropium (SPIRIVA HANDIHALER) 18 MCG inhalation capsule Place 1 capsule (18 mcg total) into inhaler and inhale daily.  30 capsule  11  . VITAMIN B1-B12 IM Inject into the muscle. Once a month      . DISCONTD: SPIRIVA HANDIHALER 18 MCG inhalation capsule PLACE 1 CAPSULE INTO INHALER AND INHALE ONCE DAILY  30 each  2  . albuterol (PROAIR HFA) 108 (90 BASE) MCG/ACT inhaler 1-2 puffs every 4-6 hours as needed

## 2011-08-01 ENCOUNTER — Other Ambulatory Visit: Payer: Self-pay | Admitting: *Deleted

## 2011-08-01 MED ORDER — FAMOTIDINE 20 MG PO TABS: 20.0000 mg | ORAL_TABLET | Freq: Two times a day (BID) | ORAL | Status: DC

## 2011-08-07 ENCOUNTER — Other Ambulatory Visit: Payer: Self-pay | Admitting: Vascular Surgery

## 2011-08-13 ENCOUNTER — Encounter: Payer: Self-pay | Admitting: Vascular Surgery

## 2011-08-14 ENCOUNTER — Encounter: Payer: Self-pay | Admitting: Vascular Surgery

## 2011-08-14 ENCOUNTER — Ambulatory Visit (INDEPENDENT_AMBULATORY_CARE_PROVIDER_SITE_OTHER): Payer: Medicare Other | Admitting: Vascular Surgery

## 2011-08-14 ENCOUNTER — Ambulatory Visit
Admission: RE | Admit: 2011-08-14 | Discharge: 2011-08-14 | Disposition: A | Discharge status: Home / Self Care | Payer: Medicare Other | Source: Ambulatory Visit | Attending: Vascular Surgery | Admitting: Vascular Surgery

## 2011-08-14 VITALS — BP 178/80 | HR 74 | Temp 97.4°F | Ht 71.5 in | Wt 188.0 lb

## 2011-08-14 DIAGNOSIS — Z48812 Encounter for surgical aftercare following surgery on the circulatory system: Secondary | ICD-10-CM

## 2011-08-14 DIAGNOSIS — I714 Abdominal aortic aneurysm, without rupture, unspecified: Secondary | ICD-10-CM | POA: Insufficient documentation

## 2011-08-14 MED ORDER — IOHEXOL 350 MG/ML SOLN
80.0000 mL | Freq: Once | INTRAVENOUS | Status: AC | PRN
  Administered 2011-08-14: 80 mL via INTRAVENOUS

## 2011-08-14 NOTE — Progress Notes (Signed)
The patient presents today for continued followup of abdominal aortic aneurysm. He is here today with several family members. He does quite well despite his age of 4. He is independent unable to get around on his own. He is quite alert and oriented. He denies any symptoms referable to his aneurysm. He has no new major medical difficulties  Past Medical History  Diagnosis Date  . Gout   . Stroke 1993, 1995    chronic balance issues  . Hyperlipidemia   . Emphysema   . Hypertension   . CAD (coronary artery disease)     stent 2006  . Seizure disorder     History  Substance Use Topics  . Smoking status: Former Smoker -- 0.3 packs/day for 50 years    Types: Cigarettes    Quit date: 01/15/1990  . Smokeless tobacco: Never Used  . Alcohol Use: No    Family History  Problem Relation Age of Onset  . Emphysema Brother   . Heart disease Sister   . Cancer Brother     lung  . Cancer Brother     throat  . Rheum arthritis Mother   . Rheum arthritis Father     Allergies  Allergen Reactions  . Penicillins     itching    Current outpatient prescriptions:acetaminophen (TYLENOL) 500 MG tablet, Take 500 mg by mouth every 6 (six) hours as needed.  , Disp: , Rfl: ;  albuterol (PROAIR HFA) 108 (90 BASE) MCG/ACT inhaler, 1-2 puffs every 4-6 hours as needed , Disp: , Rfl: ;  allopurinol (ZYLOPRIM) 300 MG tablet, Take 300 mg by mouth daily.  , Disp: , Rfl: ;  amLODipine (NORVASC) 5 MG tablet, 1/2 tablet once daily, Disp: , Rfl:  aspirin 325 MG tablet, Take 325 mg by mouth daily.  , Disp: , Rfl: ;  bisacodyl (DULCOLAX) 5 MG EC tablet, Take 5 mg by mouth daily as needed.  , Disp: , Rfl: ;  clopidogrel (PLAVIX) 75 MG tablet, Take 75 mg by mouth daily. , Disp: , Rfl: ;  COZAAR 100 MG tablet, TAKE 1 TABLET BY MOUTH ONCE A DAY., Disp: 30 each, Rfl: 6;  donepezil (ARICEPT) 10 MG tablet, Take 5 mg by mouth at bedtime. , Disp: , Rfl:  famotidine (PEPCID) 20 MG tablet, Take 1 tablet (20 mg total) by mouth 2  (two) times daily., Disp: 60 tablet, Rfl: 6;  isosorbide mononitrate (IMDUR) 60 MG 24 hr tablet, Take 1 tablet (60 mg total) by mouth daily., Disp: 30 tablet, Rfl: 11;  metoprolol tartrate (LOPRESSOR) 25 MG tablet, Take 25 mg by mouth daily.  , Disp: , Rfl:  nitroGLYCERIN (NITROSTAT) 0.4 MG SL tablet, Place 1 tablet (0.4 mg total) under the tongue every 5 (five) minutes as needed for chest pain. 1 tablet under tongue at onset of chest pain;you may repeat every 5 minutes for up to 3 doses., Disp: 30 tablet, Rfl: 6;  Oxcarbazepine (TRILEPTAL) 300 MG tablet, 1 tablet in am and 1 1/2 tablets at bedtime , Disp: , Rfl:  oxybutynin (DITROPAN) 5 MG tablet, Take 5 mg by mouth 2 (two) times daily. , Disp: , Rfl: ;  pravastatin (PRAVACHOL) 40 MG tablet, Daily at bedtime, Disp: , Rfl: ;  Tamsulosin HCl (FLOMAX) 0.4 MG CAPS, Once daily, Disp: , Rfl: ;  tiotropium (SPIRIVA HANDIHALER) 18 MCG inhalation capsule, Place 1 capsule (18 mcg total) into inhaler and inhale daily., Disp: 30 capsule, Rfl: 11;  VITAMIN B1-B12 IM, Inject into the muscle.  Once a month, Disp: , Rfl:  No current facility-administered medications for this visit. Facility-Administered Medications Ordered in Other Visits: iohexol (OMNIPAQUE) 350 MG/ML injection 80 mL, 80 mL, Intravenous, Once PRN, Medication Radiologist, MD, 80 mL at 08/14/11 1309  BP 178/80  Pulse 74  Temp 97.4 F (36.3 C) (Oral)  Ht 5' 11.5" (1.816 m)  Wt 188 lb (85.276 kg)  BMI 25.86 kg/m2  SpO2 98%  Body mass index is 25.86 kg/(m^2).       Physical exam well-developed well-nourished white male appearing stated age in no acute distress. Chest clear bilaterally without wheezes Heart regular rate and rhythm Abdomen soft nontender palpable aneurysm which is nontender over the aneurysm. Pulse status 2+ radial 2+ femoral 2+ popliteal and 2+ posterior tibial pulses bilaterally Neurologically grossly intact  He underwent a CT scan today and have evaluated this interview  with the patient and his family. This shows minimal increase in size up to 4.9 cm. This is juxtarenal.  Impression and plan 4.9 cm juxtarenal abdominal aortic aneurysm. I discussed the need for continued surveillance with the patient and his family. We will watch this at 6 month intervals with ultrasound. I explained that he would require open repair and a large magnitude of this. Also discussed symptoms of leaking aneurysm he does report to [place] immediate should this occur

## 2011-09-04 ENCOUNTER — Ambulatory Visit (INDEPENDENT_AMBULATORY_CARE_PROVIDER_SITE_OTHER): Payer: Medicare Other | Admitting: Cardiology

## 2011-09-04 ENCOUNTER — Encounter: Payer: Self-pay | Admitting: Cardiology

## 2011-09-04 VITALS — BP 132/70 | HR 78 | Ht 71.5 in | Wt 188.0 lb

## 2011-09-04 DIAGNOSIS — E785 Hyperlipidemia, unspecified: Secondary | ICD-10-CM

## 2011-09-04 DIAGNOSIS — I2510 Atherosclerotic heart disease of native coronary artery without angina pectoris: Secondary | ICD-10-CM

## 2011-09-04 DIAGNOSIS — I10 Essential (primary) hypertension: Secondary | ICD-10-CM

## 2011-09-04 DIAGNOSIS — J449 Chronic obstructive pulmonary disease, unspecified: Secondary | ICD-10-CM

## 2011-09-04 DIAGNOSIS — I714 Abdominal aortic aneurysm, without rupture: Secondary | ICD-10-CM

## 2011-09-04 NOTE — Assessment & Plan Note (Signed)
He is doing well. Continue secondary preventative therapy. I'll see him back in a year.

## 2011-09-04 NOTE — Assessment & Plan Note (Signed)
Patient advised to followup with vascular surgery. They're seeing him every 6 months as noted in the history of present illness. Continue secondary preventative therapy.

## 2011-09-04 NOTE — Progress Notes (Signed)
HPI [name] comes in today for evaluation and management of his coronary artery disease. He is having no angina or ischemic symptoms. He is on maximum secondary preventative therapy. He is very compliant with his medications.  He also has a 4.9 abdominal aortic aneurysm followed by Dr. Arbie Cookey. Please review his notes. He is seeing him every 6 months.  Past Medical History  Diagnosis Date  . Gout   . Stroke 1993, 1995    chronic balance issues  . Hyperlipidemia   . Emphysema   . Hypertension   . CAD (coronary artery disease)     stent 2006  . Seizure disorder     Current Outpatient Prescriptions  Medication Sig Dispense Refill  . acetaminophen (TYLENOL) 500 MG tablet Take 500 mg by mouth every 6 (six) hours as needed.        Marland Kitchen albuterol (PROAIR HFA) 108 (90 BASE) MCG/ACT inhaler 1-2 puffs every 4-6 hours as needed       . allopurinol (ZYLOPRIM) 300 MG tablet Take 300 mg by mouth daily.        Marland Kitchen amLODipine (NORVASC) 5 MG tablet 1/2 tablet once daily      . aspirin 325 MG tablet Take 325 mg by mouth daily.        . bisacodyl (DULCOLAX) 5 MG EC tablet Take 5 mg by mouth daily as needed.        . clopidogrel (PLAVIX) 75 MG tablet Take 75 mg by mouth daily.       Marland Kitchen COZAAR 100 MG tablet TAKE 1 TABLET BY MOUTH ONCE A DAY.  30 each  6  . donepezil (ARICEPT) 10 MG tablet Take 10 mg by mouth at bedtime.       . famotidine (PEPCID) 20 MG tablet Take 1 tablet (20 mg total) by mouth 2 (two) times daily.  60 tablet  6  . isosorbide mononitrate (IMDUR) 60 MG 24 hr tablet Take 1 tablet (60 mg total) by mouth daily.  30 tablet  11  . metoprolol tartrate (LOPRESSOR) 25 MG tablet Take 25 mg by mouth daily.        . nitroGLYCERIN (NITROSTAT) 0.4 MG SL tablet Place 1 tablet (0.4 mg total) under the tongue every 5 (five) minutes as needed for chest pain. 1 tablet under tongue at onset of chest pain;you may repeat every 5 minutes for up to 3 doses.  30 tablet  6  . Oxcarbazepine (TRILEPTAL) 300 MG tablet 1  tablet in am and 1 1/2 tablets at bedtime       . oxybutynin (DITROPAN) 5 MG tablet Take 5 mg by mouth 2 (two) times daily.       . pravastatin (PRAVACHOL) 40 MG tablet Daily at bedtime      . Tamsulosin HCl (FLOMAX) 0.4 MG CAPS Once daily      . tiotropium (SPIRIVA HANDIHALER) 18 MCG inhalation capsule Place 1 capsule (18 mcg total) into inhaler and inhale daily.  30 capsule  11  . VITAMIN B1-B12 IM Inject into the muscle. Once a month        Allergies  Allergen Reactions  . Penicillins     itching    Family History  Problem Relation Age of Onset  . Emphysema Brother   . Heart disease Sister   . Cancer Brother     lung  . Cancer Brother     throat  . Rheum arthritis Mother   . Rheum arthritis Father  History   Social History  . Marital Status: Widowed    Spouse Name: N/A    Number of Children: 2  . Years of Education: N/A   Occupational History  . Retired Garment/textile technologist   Social History Main Topics  . Smoking status: Former Smoker -- 0.3 packs/day for 50 years    Types: Cigarettes    Quit date: 01/15/1990  . Smokeless tobacco: Never Used  . Alcohol Use: No  . Drug Use: No  . Sexually Active: Not on file   Other Topics Concern  . Not on file   Social History Narrative  . No narrative on file    ROS ALL NEGATIVE EXCEPT THOSE NOTED IN HPI  PE  General Appearance: well developed, well nourished in no acute distress, looks much younger than stated age HEENT: symmetrical face, PERRLA, good dentition  Neck: no JVD, thyromegaly, or adenopathy, trachea midline Chest: symmetric without deformity Cardiac: PMI non-displaced, RRR, normal S1, S2, no gallop or murmur Lung: clear to ausculation and percussion Vascular: all pulses full without bruits  Abdominal: nondistended, nontender, good bowel sounds, no HSM, no bruits Extremities: no cyanosis, clubbing or edema, no sign of DVT, superficial varicosities  Skin: normal color, no rashes Neuro: alert and  oriented x 3, non-focal Pysch: normal affect  EKG Not repeated BMET    Component Value Date/Time   NA 130* 06/08/2010 0737   K 3.7 06/08/2010 0737   CL 96 06/08/2010 0737   CO2 25 06/08/2010 0737   GLUCOSE 111* 06/08/2010 0737   BUN 15 08/07/2011 0837   CREATININE 1.39* 08/07/2011 0837   CREATININE 1.34 06/08/2010 0737   CALCIUM 9.9 06/08/2010 0737   GFRNONAA 50* 06/08/2010 0737   GFRAA  Value: >60        The eGFR has been calculated using the MDRD equation. This calculation has not been validated in all clinical situations. eGFR's persistently <60 mL/min signify possible Chronic Kidney Disease. 06/08/2010 0737    Lipid Panel     Component Value Date/Time   CHOL 164 01/10/2009 1306   TRIG 125 01/10/2009 1306   HDL 38 01/10/2009 1306   LDLCALC 101 01/10/2009 1306    CBC    Component Value Date/Time   WBC 5.4 06/08/2010 0737   RBC 4.21* 06/08/2010 0737   HGB 13.6 06/08/2010 0737   HCT 39.1 06/08/2010 0737   PLT 146* 06/08/2010 0737   MCV 92.9 06/08/2010 0737   MCH 32.3 06/08/2010 0737   MCHC 34.8 06/08/2010 0737   RDW 14.0 06/08/2010 0737   LYMPHSABS 1.5 06/08/2010 0737   MONOABS 0.6 06/08/2010 0737   EOSABS 0.3 06/08/2010 0737   BASOSABS 0.0 06/08/2010 0737

## 2011-09-04 NOTE — Patient Instructions (Addendum)
Your physician recommends that you schedule a follow-up appointment in: 1 year  

## 2011-09-26 ENCOUNTER — Encounter (INDEPENDENT_AMBULATORY_CARE_PROVIDER_SITE_OTHER): Payer: Self-pay | Admitting: Internal Medicine

## 2011-09-26 ENCOUNTER — Ambulatory Visit (INDEPENDENT_AMBULATORY_CARE_PROVIDER_SITE_OTHER): Payer: Medicare Other | Admitting: Internal Medicine

## 2011-09-26 VITALS — BP 118/64 | HR 52 | Temp 97.7°F | Ht 71.5 in | Wt 188.0 lb

## 2011-09-26 DIAGNOSIS — K5900 Constipation, unspecified: Secondary | ICD-10-CM

## 2011-09-26 NOTE — Patient Instructions (Addendum)
Linzess one a day. (Box labeled). Go to drug store and get an enema and try. Soup Suds enema.

## 2011-09-26 NOTE — Progress Notes (Addendum)
Subjective:     Patient ID: [name], male   DOB: December 29, 1921, 78 y.o.   MRN: [id number]  HPI [name] is a 78 yr old male presenting today with c/o constipation.  He says he cannot have a BM unless he takes something. He is having a BM about once a week. He tells me today he has not gone in about 5 days. He says he saw blood in his stool a couple of weeks ago while straining.   He has a long hx of constipation.   Appetite is good.  No weight loss.  No melena or bright red rectal bleeding. He has tried Miralax in the past x 1 week which did not help.   Review of Systems see hpi Current Outpatient Prescriptions  Medication Sig Dispense Refill  . acetaminophen (TYLENOL) 500 MG tablet Take 500 mg by mouth every 6 (six) hours as needed.        Marland Kitchen albuterol (PROAIR HFA) 108 (90 BASE) MCG/ACT inhaler 1-2 puffs every 4-6 hours as needed       . allopurinol (ZYLOPRIM) 300 MG tablet Take 300 mg by mouth daily.        Marland Kitchen amLODipine (NORVASC) 5 MG tablet 1/2 tablet once daily      . aspirin 325 MG tablet Take 325 mg by mouth daily.        . bisacodyl (DULCOLAX) 5 MG EC tablet Take 5 mg by mouth daily as needed.        . clopidogrel (PLAVIX) 75 MG tablet Take 75 mg by mouth daily.       Marland Kitchen COZAAR 100 MG tablet TAKE 1 TABLET BY MOUTH ONCE A DAY.  30 each  6  . donepezil (ARICEPT) 10 MG tablet Take 10 mg by mouth at bedtime.       . famotidine (PEPCID) 20 MG tablet Take 1 tablet (20 mg total) by mouth 2 (two) times daily.  60 tablet  6  . isosorbide mononitrate (IMDUR) 60 MG 24 hr tablet Take 1 tablet (60 mg total) by mouth daily.  30 tablet  11  . metoprolol tartrate (LOPRESSOR) 25 MG tablet Take 25 mg by mouth daily.        . nitroGLYCERIN (NITROSTAT) 0.4 MG SL tablet Place 1 tablet (0.4 mg total) under the tongue every 5 (five) minutes as needed for chest pain. 1 tablet under tongue at onset of chest pain;you may repeat every 5 minutes for up to 3 doses.  30 tablet  6  . Oxcarbazepine (TRILEPTAL) 300 MG  tablet 1 tablet in am and 1 1/2 tablets at bedtime       . oxybutynin (DITROPAN) 5 MG tablet Take 5 mg by mouth 2 (two) times daily.       . pravastatin (PRAVACHOL) 40 MG tablet Daily at bedtime      . Tamsulosin HCl (FLOMAX) 0.4 MG CAPS Once daily      . tiotropium (SPIRIVA HANDIHALER) 18 MCG inhalation capsule Place 1 capsule (18 mcg total) into inhaler and inhale daily.  30 capsule  11  . VITAMIN B1-B12 IM Inject into the muscle. Once a month       Past Medical History  Diagnosis Date  . Gout   . Stroke 1993, 1995    chronic balance issues  . Hyperlipidemia   . Emphysema   . Hypertension   . CAD (coronary artery disease)     stent 2006  . Seizure disorder    History  Social History  . Marital Status: Widowed    Spouse Name: N/A    Number of Children: 2  . Years of Education: N/A   Occupational History  . Retired Garment/textile technologist   Social History Main Topics  . Smoking status: Former Smoker -- 0.3 packs/day for 50 years    Types: Cigarettes    Quit date: 01/15/1990  . Smokeless tobacco: Never Used  . Alcohol Use: No  . Drug Use: No  . Sexually Active: Not on file   Other Topics Concern  . Not on file   Social History Narrative  . No narrative on file   Family Status  Relation Status Death Age  . Brother Deceased     One alive, but not in good health  . Sister Deceased   . Mother Deceased   . Father Deceased    Allergies  Allergen Reactions  . Penicillins     itching   Past Surgical History  Procedure Date  . Cardiac catheterization 08/23/2004    bare metal stenting of the proximal circumflex        Objective:   Physical Exam Filed Vitals:   09/26/11 1113  BP: 118/64  Pulse: 52  Temp: 97.7 F (36.5 C)  Height: 5' 11.5" (1.816 m)  Weight: 188 lb (85.276 kg)   Alert and oriented. Skin warm and dry. Oral mucosa is moist.   . Sclera anicteric, conjunctivae is pink. Thyroid not enlarged. No cervical lymphadenopathy. Lungs clear. Heart  regular rate and rhythm.  Abdomen is soft. Bowel sounds are positive. No hepatomegaly. No abdominal masses felt. No tenderness.  No edema to lower extremities.      Assessment:     Chronic constipation.  He says he can only have a BM when he take a Dulcolax.  Plan:    Will try on Linzess and see how he does. PR in 2 weeks. Soap Suds enema today.

## 2011-10-04 ENCOUNTER — Telehealth (INDEPENDENT_AMBULATORY_CARE_PROVIDER_SITE_OTHER): Payer: Self-pay | Admitting: Internal Medicine

## 2011-10-04 NOTE — Telephone Encounter (Signed)
He has tried Linzess which really has not helped. His last colonoscopy was greater than 10 yrs by Dr. Karilyn Cota.  Will schedule a colonoscopy with Dr. Karilyn Cota.   Ann, Colonoscopy with Dr. Karilyn Cota.  Patient also would like a colonoscopy. (Takes ASA daily, and Plavix)

## 2011-10-05 ENCOUNTER — Telehealth (INDEPENDENT_AMBULATORY_CARE_PROVIDER_SITE_OTHER): Payer: Self-pay | Admitting: *Deleted

## 2011-10-05 ENCOUNTER — Other Ambulatory Visit (INDEPENDENT_AMBULATORY_CARE_PROVIDER_SITE_OTHER): Payer: Self-pay | Admitting: *Deleted

## 2011-10-05 DIAGNOSIS — Z1211 Encounter for screening for malignant neoplasm of colon: Secondary | ICD-10-CM

## 2011-10-05 DIAGNOSIS — K5900 Constipation, unspecified: Secondary | ICD-10-CM

## 2011-10-05 NOTE — Telephone Encounter (Signed)
Patient needs movi prep 

## 2011-10-05 NOTE — Telephone Encounter (Signed)
TCS sch'd 11/01/11, patient aware

## 2011-10-09 MED ORDER — PEG-KCL-NACL-NASULF-NA ASC-C 100 G PO SOLR: 1.0000 | Freq: Once | ORAL | Status: DC

## 2011-10-24 ENCOUNTER — Encounter (HOSPITAL_COMMUNITY): Payer: Self-pay | Admitting: Pharmacy Technician

## 2011-10-31 MED ORDER — SODIUM CHLORIDE 0.45 % IV SOLN
INTRAVENOUS | Status: DC
  Administered 2011-11-01: 12:00:00 via INTRAVENOUS

## 2011-11-01 ENCOUNTER — Encounter (HOSPITAL_COMMUNITY)
Admission: RE | Disposition: A | Discharge status: Home / Self Care | Payer: Self-pay | Source: Ambulatory Visit | Attending: Internal Medicine

## 2011-11-01 ENCOUNTER — Encounter (HOSPITAL_COMMUNITY): Payer: Self-pay | Admitting: *Deleted

## 2011-11-01 ENCOUNTER — Ambulatory Visit (HOSPITAL_COMMUNITY)
Admission: RE | Admit: 2011-11-01 | Discharge: 2011-11-01 | Disposition: A | Discharge status: Home / Self Care | Payer: Medicare Other | Source: Ambulatory Visit | Attending: Internal Medicine | Admitting: Internal Medicine

## 2011-11-01 DIAGNOSIS — K5730 Diverticulosis of large intestine without perforation or abscess without bleeding: Secondary | ICD-10-CM | POA: Insufficient documentation

## 2011-11-01 DIAGNOSIS — K644 Residual hemorrhoidal skin tags: Secondary | ICD-10-CM

## 2011-11-01 DIAGNOSIS — K5900 Constipation, unspecified: Secondary | ICD-10-CM

## 2011-11-01 DIAGNOSIS — I10 Essential (primary) hypertension: Secondary | ICD-10-CM | POA: Insufficient documentation

## 2011-11-01 DIAGNOSIS — E785 Hyperlipidemia, unspecified: Secondary | ICD-10-CM | POA: Insufficient documentation

## 2011-11-01 DIAGNOSIS — K921 Melena: Secondary | ICD-10-CM | POA: Insufficient documentation

## 2011-11-01 DIAGNOSIS — R198 Other specified symptoms and signs involving the digestive system and abdomen: Secondary | ICD-10-CM | POA: Insufficient documentation

## 2011-11-01 HISTORY — DX: Gastro-esophageal reflux disease without esophagitis: K21.9

## 2011-11-01 HISTORY — PX: COLONOSCOPY: SHX5424

## 2011-11-01 SURGERY — COLONOSCOPY
Anesthesia: Moderate Sedation

## 2011-11-01 MED ORDER — STERILE WATER FOR IRRIGATION IR SOLN
Status: DC | PRN
  Administered 2011-11-01: 13:00:00

## 2011-11-01 MED ORDER — MIDAZOLAM HCL 5 MG/5ML IJ SOLN
INTRAMUSCULAR | Status: AC
  Filled 2011-11-01: qty 5

## 2011-11-01 MED ORDER — SENNA-DOCUSATE SODIUM 8.6-50 MG PO TABS: 1.0000 | ORAL_TABLET | Freq: Two times a day (BID) | ORAL | Status: DC

## 2011-11-01 MED ORDER — MEPERIDINE HCL 50 MG/ML IJ SOLN
INTRAMUSCULAR | Status: AC
  Filled 2011-11-01: qty 1

## 2011-11-01 MED ORDER — PSYLLIUM 28 % PO PACK: 1.0000 | PACK | Freq: Every day | ORAL | Status: DC

## 2011-11-01 MED ORDER — MEPERIDINE HCL 50 MG/ML IJ SOLN
INTRAMUSCULAR | Status: DC | PRN
  Administered 2011-11-01: 25 mg via INTRAVENOUS

## 2011-11-01 MED ORDER — MIDAZOLAM HCL 5 MG/5ML IJ SOLN
INTRAMUSCULAR | Status: DC | PRN
  Administered 2011-11-01 (×3): 1 mg via INTRAVENOUS

## 2011-11-01 NOTE — H&P (Signed)
[name] is an 78 y.o. male.   Chief Complaint: Patient is here for colonoscopy. HPI: Patient is 78 year old Caucasian male who presents with worsening constipation. His bowels have not moved he takes a laxative. He is also intermittent hematochezia that time she passes burgundy blood this usually small in amount. He denies abdominal pain anorexia weight loss. He has tried Clinical biochemist as well as Linaclotide without any results. Patient,s last colonoscopy was in October 2003. Him history is negative for CRC  Past Medical History  Diagnosis Date  . Gout   . Stroke 1993, 1995    chronic balance issues  . Hyperlipidemia   . Emphysema   . Hypertension   . CAD (coronary artery disease)     stent 2006  . Seizure disorder   . GERD (gastroesophageal reflux disease)     Past Surgical History  Procedure Date  . Cardiac catheterization 08/23/2004    bare metal stenting of the proximal circumflex  . Colonoscopy     X 2    Family History  Problem Relation Age of Onset  . Emphysema Brother   . Heart disease Sister   . Cancer Brother     lung  . Cancer Brother     throat  . Rheum arthritis Mother   . Rheum arthritis Father   . Colon cancer Neg Hx    Social History:  reports that he quit smoking about 21 years ago. His smoking use included Cigarettes. He has a 15 pack-year smoking history. He has never used smokeless tobacco. He reports that he does not drink alcohol or use illicit drugs.  Allergies:  Allergies  Allergen Reactions  . Penicillins     itching    Medications Prior to Admission  Medication Sig Dispense Refill  . acetaminophen (TYLENOL) 500 MG tablet Take 500 mg by mouth every 6 (six) hours as needed.        Marland Kitchen albuterol (PROAIR HFA) 108 (90 BASE) MCG/ACT inhaler 1-2 puffs every 4-6 hours as needed       . allopurinol (ZYLOPRIM) 300 MG tablet Take 300 mg by mouth daily.        Marland Kitchen amLODipine (NORVASC) 5 MG tablet Take 2.5 mg by mouth daily.       Marland Kitchen aspirin 325 MG tablet  Take 325 mg by mouth daily.        . bisacodyl (DULCOLAX) 5 MG EC tablet Take 5 mg by mouth daily as needed.        . clopidogrel (PLAVIX) 75 MG tablet Take 75 mg by mouth daily.       Marland Kitchen donepezil (ARICEPT) 10 MG tablet Take 10 mg by mouth at bedtime.       . famotidine (PEPCID) 20 MG tablet Take 1 tablet (20 mg total) by mouth 2 (two) times daily.  60 tablet  6  . isosorbide mononitrate (IMDUR) 60 MG 24 hr tablet Take 1 tablet (60 mg total) by mouth daily.  30 tablet  11  . losartan (COZAAR) 100 MG tablet Take 100 mg by mouth daily.      . metoprolol tartrate (LOPRESSOR) 25 MG tablet Take 25 mg by mouth daily.        . nitroGLYCERIN (NITROSTAT) 0.4 MG SL tablet Place 1 tablet (0.4 mg total) under the tongue every 5 (five) minutes as needed for chest pain. 1 tablet under tongue at onset of chest pain;you may repeat every 5 minutes for up to 3 doses.  30 tablet  6  . Oxcarbazepine (TRILEPTAL) 300 MG tablet Take 450 mg by mouth at bedtime and may repeat dose one time if needed.       Marland Kitchen oxybutynin (DITROPAN) 5 MG tablet Take 5 mg by mouth 2 (two) times daily.       . peg 3350 powder (MOVIPREP) 100 G SOLR Take 1 kit (100 g total) by mouth once.  1 kit  0  . pravastatin (PRAVACHOL) 40 MG tablet Take 40 mg by mouth daily. Daily at bedtime      . Tamsulosin HCl (FLOMAX) 0.4 MG CAPS Once daily      . tiotropium (SPIRIVA HANDIHALER) 18 MCG inhalation capsule Place 1 capsule (18 mcg total) into inhaler and inhale daily.  30 capsule  11  . VITAMIN B1-B12 IM Inject into the muscle. Once a month        No results found for this or any previous visit (from the past 48 hour(s)). No results found.  ROS  Blood pressure 163/79, pulse 58, temperature 97.5 F (36.4 C), temperature source Oral, resp. rate 15, SpO2 94.00%. Physical Exam  Constitutional: He appears well-developed and well-nourished.  HENT:  Mouth/Throat: Oropharynx is clear and moist.  Eyes: Conjunctivae normal are normal. No scleral icterus.    Neck: No thyromegaly present.  Cardiovascular: Normal rate, regular rhythm and normal heart sounds.   No murmur heard. Respiratory: Effort normal and breath sounds normal.  GI: Soft. He exhibits no distension and no mass. There is no tenderness.  Musculoskeletal: He exhibits no edema.  Lymphadenopathy:    He has no cervical adenopathy.  Neurological: He is alert.  Skin: Skin is warm and dry.     Assessment/Plan Change in bowel habits with worsening constipation and hematochezia. Diagnostic colonoscopy.  REHMAN,NAJEEB U 11/01/2011, 12:46 PM

## 2011-11-01 NOTE — Op Note (Signed)
COLONOSCOPY PROCEDURE REPORT  PATIENT:  [name]  MR#:  [id number] Birthdate:  1921-10-19, 78 y.o., male Endoscopist:  Dr. Malissa Hippo, MD Referred By:  Dr. Carylon Perches, MD Procedure Date: 11/01/2011  Procedure:   Colonoscopy  Indications:  Patient is 28 years-year-old Caucasian male who presents with change in his bowel habits/worsening constipation. His bowels move only when he takes a laxative. He also complains of intermittent hematochezia. Patient's last colonoscopy was 10 years ago.  Informed Consent:  The procedure and risks were reviewed with the patient and informed consent was obtained.  Medications:  Demerol 25 mg IV Versed 3 mg IV  Description of procedure:  After a digital rectal exam was performed, that colonoscope was advanced from the anus through the rectum and colon to the area of the cecum, ileocecal valve and appendiceal orifice. The cecum was deeply intubated. These structures were well-seen and photographed for the record. From the level of the cecum and ileocecal valve, the scope was slowly and cautiously withdrawn. The mucosal surfaces were carefully surveyed utilizing scope tip to flexion to facilitate fold flattening as needed. The scope was pulled down into the rectum where a thorough exam including retroflexion was performed.  Findings:   Prep satisfactory. Few diverticula at sigmoid colon and one at ascending colon. No evidence of colonic polyps, tumor or stricture. Normal rectal mucosa. Small hemorrhoids below the dentate line.  Therapeutic/Diagnostic Maneuvers Performed:  None  Complications:  None  Cecal Withdrawal Time:  9 minutes  Impression:  Examination performed to cecum. Few diverticula at sigmoid colon and one at ascending colon. Small external hemorrhoids. Suspect this constipation is secondary to medications.  Recommendations:  High fiber diet. Peri-Colace 1 by mouth twice a day. Fleet enema or suppository every third day as  needed. Stool diary until office visit in 8 weeks.  REHMAN,NAJEEB U  11/01/2011 1:24 PM  CC: Dr. Carylon Perches, MD & Dr. Bonnetta Barry ref. provider found

## 2011-11-08 ENCOUNTER — Encounter (HOSPITAL_COMMUNITY): Payer: Self-pay | Admitting: Internal Medicine

## 2011-12-19 ENCOUNTER — Encounter (INDEPENDENT_AMBULATORY_CARE_PROVIDER_SITE_OTHER): Payer: Self-pay | Admitting: *Deleted

## 2011-12-21 ENCOUNTER — Other Ambulatory Visit (HOSPITAL_COMMUNITY): Payer: Self-pay | Admitting: Internal Medicine

## 2011-12-21 ENCOUNTER — Ambulatory Visit (HOSPITAL_COMMUNITY)
Admission: RE | Admit: 2011-12-21 | Discharge: 2011-12-21 | Disposition: A | Discharge status: Home / Self Care | Payer: Medicare Other | Source: Ambulatory Visit | Attending: Internal Medicine | Admitting: Internal Medicine

## 2011-12-21 DIAGNOSIS — M79606 Pain in leg, unspecified: Secondary | ICD-10-CM

## 2011-12-21 DIAGNOSIS — M545 Low back pain, unspecified: Secondary | ICD-10-CM | POA: Insufficient documentation

## 2011-12-21 DIAGNOSIS — M25559 Pain in unspecified hip: Secondary | ICD-10-CM | POA: Insufficient documentation

## 2012-01-01 ENCOUNTER — Ambulatory Visit (INDEPENDENT_AMBULATORY_CARE_PROVIDER_SITE_OTHER): Payer: Medicare Other | Admitting: Orthopedic Surgery

## 2012-01-01 ENCOUNTER — Ambulatory Visit: Payer: Medicare Other | Admitting: Critical Care Medicine

## 2012-01-01 ENCOUNTER — Encounter: Payer: Self-pay | Admitting: Orthopedic Surgery

## 2012-01-01 ENCOUNTER — Ambulatory Visit (INDEPENDENT_AMBULATORY_CARE_PROVIDER_SITE_OTHER): Payer: Medicare Other

## 2012-01-01 ENCOUNTER — Ambulatory Visit (INDEPENDENT_AMBULATORY_CARE_PROVIDER_SITE_OTHER): Payer: Medicare Other | Admitting: Internal Medicine

## 2012-01-01 VITALS — BP 130/76 | Ht 71.5 in | Wt 187.0 lb

## 2012-01-01 DIAGNOSIS — M79609 Pain in unspecified limb: Secondary | ICD-10-CM

## 2012-01-01 DIAGNOSIS — M79606 Pain in leg, unspecified: Secondary | ICD-10-CM | POA: Insufficient documentation

## 2012-01-01 DIAGNOSIS — G629 Polyneuropathy, unspecified: Secondary | ICD-10-CM

## 2012-01-01 DIAGNOSIS — M5137 Other intervertebral disc degeneration, lumbosacral region: Secondary | ICD-10-CM

## 2012-01-01 DIAGNOSIS — G589 Mononeuropathy, unspecified: Secondary | ICD-10-CM

## 2012-01-01 MED ORDER — GABAPENTIN 100 MG PO CAPS: 100.0000 mg | ORAL_CAPSULE | Freq: Three times a day (TID) | ORAL | Status: DC

## 2012-01-01 MED ORDER — HYDROCODONE-ACETAMINOPHEN 5-325 MG PO TABS: 1.0000 | ORAL_TABLET | Freq: Four times a day (QID) | ORAL | Status: DC | PRN

## 2012-01-01 MED ORDER — PREDNISONE 5 MG PO KIT: 5.0000 mg | PACK | ORAL | Status: DC

## 2012-01-01 NOTE — Progress Notes (Signed)
Patient ID: [name], male   DOB: 1921-04-02, 78 y.o.   MRN: [id number] Chief Complaint  Patient presents with  . Hip Pain    Left hip pain, no injury. Referred by Dr. Ouida Sills.    9-year-old male with a history of a cerebrovascular accident with mild to minimal left-sided residual presents with pain in his LEFT hip radiating down to his LEFT great toe since the middle of November, which came on suddenly. He received a cortisone injection, but it was without relief. He also tried some over-the-counter anti-inflammatories. Still had increasing pain. He complains of sharp, burning 10 out of 10. Intermittent LEFT lower extremity pain, radiating down his leg, which is worse at night and when lying in bed.  The review of systems includes a history of weight gain, shortness of breath, cough, constipation, urgency, joint pain with instability, stiffness, muscle pain, unsteady gait, easy bleeding and bruising. He is on Plavix.  Excessive urination. Denies seasonal allergy.  Physical Exam  Vital signs:   GENERAL: normal development   CDV: pulses are normal   Skin: normal  Lymph: nodes were not palpable/normal  Psychiatric: awake, alert and oriented  Neuro: normal sensation  MSK  Gait: Slight alteration in gait, favoring the LEFT lower extremity 1 Inspection The lower extremities have normal alignment, leg lengths are equal. 2 Range of Motion Hip and knee range of motion within normal limits 3 Motor Normal motor exam in both lower extremities 4 Stability All joints stable in both lower extremities  Upper extremity exam  The right and left upper extremity:   Inspection and palpation revealed no abnormalities in the upper extremities.   Range of motion is full without contracture.  Motor exam is normal with grade 5 strength.  The joints are fully reduced without subluxation.  There is no atrophy or tremor and muscle tone is normal.  All joints are stable.  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  Assessment: Degenerative disc disease with LEFT lower extremity radicular pain    Plan: Recommend oral steroids for 2 weeks with gabapentin, rest, and hydrocodone for pain.  Followup in 4 weeks to reassess need for MR

## 2012-01-01 NOTE — Patient Instructions (Addendum)
You  most likely of a pinched nerve in her lower back secondary to degenerative disc disease.  Recommend oral steroids for 2 weeks Rest for 2 weeks Gabapentin 100 mg 3 times a day Hydrocodone one q.4 to 6 hours p.r.n. For pain  Return in 4 weeks for reassessment    Degenerative Disk Disease Degenerative disk disease is a condition caused by the changes that occur in the cushions of the backbone (spinal disks) as you grow older. Spinal disks are soft and compressible disks located between the bones of the spine (vertebrae). They act like shock absorbers. Degenerative disk disease can affect the whole spine. However, the neck and lower back are most commonly affected. Many changes can occur in the spinal disks with aging, such as:  The spinal disks may dry and shrink.   Small tears may occur in the tough, outer covering of the disk (annulus).   The disk space may become smaller due to loss of water.   Abnormal growths in the bone (spurs) may occur. This can put pressure on the nerve roots exiting the spinal canal, causing pain.   The spinal canal may become narrowed.  CAUSES   Degenerative disk disease is a condition caused by the changes that occur in the spinal disks with aging. The exact cause is not known, but there is a genetic basis for many patients. Degenerative changes can occur due to loss of fluid in the disk. This makes the disk thinner and reduces the space between the backbones. Small cracks can develop in the outer layer of the disk. This can lead to the breakdown of the disk. You are more likely to get degenerative disk disease if you are overweight. Smoking cigarettes and doing heavy work such as weightlifting can also increase your risk of this condition. Degenerative changes can start after a sudden injury. Growth of bone spurs can compress the nerve roots and cause pain.   SYMPTOMS   The symptoms vary from person to person. Some people may have no pain, while others have  severe pain. The pain may be so severe that it can limit your activities. The location of the pain depends on the part of your backbone that is affected. You will have neck or arm pain if a disk in the neck area is affected. You will have pain in your back, buttocks, or legs if a disk in the lower back is affected. The pain becomes worse while bending, reaching up, or with twisting movements. The pain may start gradually and then get worse as time passes. It may also start after a major or minor injury. You may feel numbness or tingling in the arms or legs.   DIAGNOSIS   Your caregiver will ask you about your symptoms and about activities or habits that may cause the pain. He or she may also ask about any injuries, diseases, or treatments you have had earlier. Your caregiver will examine you to check for the range of movement that is possible in the affected area, to check for strength in your extremities, and to check for sensation in the areas of the arms and legs supplied by different nerve roots. An X-ray of the spine may be taken. Your caregiver may suggest other imaging tests, such as magnetic resonance imaging (MRI), if needed.   TREATMENT   Treatment includes rest, modifying your activities, and applying ice and heat. Your caregiver may prescribe medicines to reduce your pain and may ask you to do some exercises  to strengthen your back. In some cases, you may need surgery. You and your caregiver will decide on the treatment that is best for you. HOME CARE INSTRUCTIONS    Follow proper lifting and walking techniques as advised by your caregiver.   Maintain good posture.   Exercise regularly as advised.   Perform relaxation exercises.   Change your sitting, standing, and sleeping habits as advised. Change positions frequently.   Lose weight as advised.   Stop smoking if you smoke.   Wear supportive footwear.  SEEK MEDICAL CARE IF:   Your pain does not go away within 1 to 4 weeks. SEEK  IMMEDIATE MEDICAL CARE IF:    Your pain is severe.   You notice weakness in your arms, hands, or legs.   You begin to lose control of your bladder or bowel movements.  MAKE SURE YOU:    Understand these instructions.   Will watch your condition.   Will get help right away if you are not doing well or get worse.  Document Released: 10/29/2006 Document Revised: 03/26/2011 Document Reviewed: 10/29/2006 [facility] Patient Information 2013 [place], [place].   Lumbosacral Radiculopathy Lumbosacral radiculopathy is a pinched nerve or nerves in the low back (lumbosacral area). When this happens you may have weakness in your legs and may not be able to stand on your toes. You may have pain going down into your legs. There may be difficulties with walking normally. There are many causes of this problem. Sometimes this may happen from an injury, or simply from arthritis or boney problems. It may also be caused by other illnesses such as diabetes. If there is no improvement after treatment, further studies may be done to find the exact cause. DIAGNOSIS   X-rays may be needed if the problems become long standing. Electromyograms may be done. This study is one in which the working of nerves and muscles is studied. HOME CARE INSTRUCTIONS    Applications of ice packs may be helpful. Ice can be used in a plastic bag with a towel around it to prevent frostbite to skin. This may be used every 2 hours for 20 to 30 minutes, or as needed, while awake, or as directed by your caregiver.   Only take over-the-counter or prescription medicines for pain, discomfort, or fever as directed by your caregiver.   If physical therapy was prescribed, follow your caregiver's directions.  SEEK IMMEDIATE MEDICAL CARE IF:    You have pain not controlled with medications.   You seem to be getting worse rather than better.   You develop increasing weakness in your legs.   You develop loss of bowel or bladder control.   You  have difficulty with walking or balance, or develop clumsiness in the use of your legs.   You have a fever.  MAKE SURE YOU:    Understand these instructions.   Will watch your condition.   Will get help right away if you are not doing well or get worse.  Document Released: 01/01/2005 Document Revised: 03/26/2011 Document Reviewed: 08/22/2007 [facility] Patient Information 2013 [place], [place].

## 2012-01-02 ENCOUNTER — Encounter (INDEPENDENT_AMBULATORY_CARE_PROVIDER_SITE_OTHER): Payer: Self-pay | Admitting: Internal Medicine

## 2012-01-02 ENCOUNTER — Ambulatory Visit (INDEPENDENT_AMBULATORY_CARE_PROVIDER_SITE_OTHER): Payer: Medicare Other | Admitting: Internal Medicine

## 2012-01-02 VITALS — BP 124/64 | HR 56 | Temp 97.3°F | Ht 72.0 in | Wt 192.7 lb

## 2012-01-02 DIAGNOSIS — K5900 Constipation, unspecified: Secondary | ICD-10-CM

## 2012-01-02 NOTE — Progress Notes (Signed)
Subjective:     Patient ID: [name], male   DOB: 1921/04/19, 78 y.o.   MRN: [id number]  HPI Here today for f/u.  Hx of constipation. Recently underwent a colonoscopy in October. See below. Stool diary with him today. He is having a BM about 2-3 times a week.  Presently taking stool softner and Metamucil. Stools are softer.  Tells me he recently started Prednisone for a pinched nerve in his back.  Take pain medication also for his back.  Appetite is good. No weight loss.  11/01/2011 Colonoscopy: Change in bowels, constipation: Impression:  Examination performed to cecum.  Few diverticula at sigmoid colon and one at ascending colon.  Small external hemorrhoids.  Suspect this constipation is secondary to medications.    Review of Systems Current Outpatient Prescriptions  Medication Sig Dispense Refill  . acetaminophen (TYLENOL) 500 MG tablet Take 500 mg by mouth every 6 (six) hours as needed.        Marland Kitchen albuterol (PROAIR HFA) 108 (90 BASE) MCG/ACT inhaler 1-2 puffs every 4-6 hours as needed       . allopurinol (ZYLOPRIM) 300 MG tablet Take 300 mg by mouth daily.        Marland Kitchen amLODipine (NORVASC) 5 MG tablet Take 2.5 mg by mouth daily.       Marland Kitchen aspirin 325 MG tablet Take 325 mg by mouth daily.        . bisacodyl (DULCOLAX) 5 MG EC tablet Take 5 mg by mouth daily as needed.        . clopidogrel (PLAVIX) 75 MG tablet Take 75 mg by mouth daily.       . famotidine (PEPCID) 20 MG tablet Take 1 tablet (20 mg total) by mouth 2 (two) times daily.  60 tablet  6  . gabapentin (NEURONTIN) 100 MG capsule Take 1 capsule (100 mg total) by mouth 3 (three) times daily.  42 capsule  1  . HYDROcodone-acetaminophen (NORCO) 5-325 MG per tablet Take 1 tablet by mouth every 6 (six) hours as needed for pain.  56 tablet  1  . isosorbide mononitrate (IMDUR) 60 MG 24 hr tablet Take 1 tablet (60 mg total) by mouth daily.  30 tablet  11  . losartan (COZAAR) 100 MG tablet Take 100 mg by mouth daily.      . metoprolol  tartrate (LOPRESSOR) 25 MG tablet Take 25 mg by mouth daily.        . nitroGLYCERIN (NITROSTAT) 0.4 MG SL tablet Place 1 tablet (0.4 mg total) under the tongue every 5 (five) minutes as needed for chest pain. 1 tablet under tongue at onset of chest pain;you may repeat every 5 minutes for up to 3 doses.  30 tablet  6  . Oxcarbazepine (TRILEPTAL) 300 MG tablet Take 450 mg by mouth at bedtime and may repeat dose one time if needed.       Marland Kitchen oxybutynin (DITROPAN) 5 MG tablet Take 5 mg by mouth 2 (two) times daily.       . pravastatin (PRAVACHOL) 40 MG tablet Take 40 mg by mouth daily. Daily at bedtime      . PredniSONE 5 MG KIT Take 1 kit (5 mg total) by mouth as directed.  48 each  0  . psyllium (METAMUCIL SMOOTH TEXTURE) 28 % packet Take 1 packet by mouth at bedtime.      . sennosides-docusate sodium (SENOKOT-S) 8.6-50 MG tablet Take 1 tablet by mouth 2 (two) times daily.      Marland Kitchen  Tamsulosin HCl (FLOMAX) 0.4 MG CAPS Once daily      . tiotropium (SPIRIVA HANDIHALER) 18 MCG inhalation capsule Place 1 capsule (18 mcg total) into inhaler and inhale daily.  30 capsule  11  . VITAMIN B1-B12 IM Inject into the muscle. Once a month      . donepezil (ARICEPT) 10 MG tablet Take 10 mg by mouth at bedtime.        Past Medical History  Diagnosis Date  . Gout   . Stroke 1993, 1995    chronic balance issues  . Hyperlipidemia   . Emphysema   . Hypertension   . CAD (coronary artery disease)     stent 2006  . Seizure disorder   . GERD (gastroesophageal reflux disease)    Past Surgical History  Procedure Date  . Cardiac catheterization 08/23/2004    bare metal stenting of the proximal circumflex  . Colonoscopy     X 2  . Colonoscopy 11/01/2011    Procedure: COLONOSCOPY;  Surgeon: Malissa Hippo, MD;  Location: [facility];  Service: Endoscopy;  Laterality: N/A;  100  . Eye surgery   . Nose surgery    Allergies  Allergen Reactions  . Penicillins     itching        Objective:   Physical  Exam Filed Vitals:   01/02/12 1055  BP: 124/64  Pulse: 56  Temp: 97.3 F (36.3 C)  Height: 6' (1.829 m)  Weight: 192 lb 11.2 oz (87.408 kg)       Assessment:    Constipation, probably medication induced. Constipation better since starting Metamucil and stool softner.  Plan:    Continue Metamucil and stool softner. OV in 1 yr unless u have problems.

## 2012-01-02 NOTE — Patient Instructions (Addendum)
Continue stool softners and Metamucil. OV in 1 yr

## 2012-01-04 ENCOUNTER — Ambulatory Visit (INDEPENDENT_AMBULATORY_CARE_PROVIDER_SITE_OTHER)
Admission: RE | Admit: 2012-01-04 | Discharge: 2012-01-04 | Disposition: A | Discharge status: Home / Self Care | Payer: Medicare Other | Source: Ambulatory Visit | Attending: Adult Health | Admitting: Adult Health

## 2012-01-04 ENCOUNTER — Encounter: Payer: Self-pay | Admitting: Critical Care Medicine

## 2012-01-04 ENCOUNTER — Ambulatory Visit (INDEPENDENT_AMBULATORY_CARE_PROVIDER_SITE_OTHER): Payer: Medicare Other | Admitting: Critical Care Medicine

## 2012-01-04 VITALS — BP 134/64 | HR 57 | Temp 97.5°F | Ht 72.0 in | Wt 196.8 lb

## 2012-01-04 DIAGNOSIS — J449 Chronic obstructive pulmonary disease, unspecified: Secondary | ICD-10-CM

## 2012-01-04 NOTE — Progress Notes (Signed)
Quick Note:  Called spoke with patient, advised of cxr results / recs as stated by TP. Pt verbalized his understanding and denied any questions. ______ 

## 2012-01-04 NOTE — Progress Notes (Signed)
Subjective:    Patient ID: [name], male    DOB: 1921-10-26, 78 y.o.   MRN: [id number]  HPI 07/04/2011 Since last ov, worse issue is the in the AM,  When takes med is better.  Not able to get much done Notes some mucus, color is beige.  No real chest pain. Notes some heartburn. >>no changes   01/04/2012 six-month followup for COPD. Patient returns for six-month followup. Patient reports he has been doing very well without any significant increase in shortness of breath, dyspnea on exertion, cough. Reports breathing doing well.  Does have Chronic dyspnea on exertion with activities that requires him to rest. Intermittently. He denies any exertional chest pain, wheezing, hemoptysis, or edema Remains on Spiriva daily. He denies any known difficulties  Past Medical History  Diagnosis Date  . Gout   . Stroke 1993, 1995    chronic balance issues  . Hyperlipidemia   . Emphysema   . Hypertension   . CAD (coronary artery disease)     stent 2006  . Seizure disorder   . GERD (gastroesophageal reflux disease)      Family History  Problem Relation Age of Onset  . Emphysema Brother   . Heart disease Sister   . Cancer Brother     lung  . Cancer Brother     throat  . Rheum arthritis Mother   . Rheum arthritis Father   . Colon cancer Neg Hx   . Diabetes       History   Social History  . Marital Status: Widowed    Spouse Name: N/A    Number of Children: 2  . Years of Education: N/A   Occupational History  . Retired Garment/textile technologist   Social History Main Topics  . Smoking status: Former Smoker -- 0.3 packs/day for 50 years    Types: Cigarettes    Quit date: 01/15/1990  . Smokeless tobacco: Never Used  . Alcohol Use: No  . Drug Use: No  . Sexually Active: Not on file   Other Topics Concern  . Not on file   Social History Narrative  . No narrative on file     Allergies  Allergen Reactions  . Penicillins     itching     Outpatient Prescriptions Prior  to Visit  Medication Sig Dispense Refill  . acetaminophen (TYLENOL) 500 MG tablet Take 500 mg by mouth every 6 (six) hours as needed.        Marland Kitchen albuterol (PROAIR HFA) 108 (90 BASE) MCG/ACT inhaler 1-2 puffs every 4-6 hours as needed       . allopurinol (ZYLOPRIM) 300 MG tablet Take 300 mg by mouth daily.        Marland Kitchen amLODipine (NORVASC) 5 MG tablet Take 2.5 mg by mouth daily.       Marland Kitchen aspirin 325 MG tablet Take 325 mg by mouth daily.        . bisacodyl (DULCOLAX) 5 MG EC tablet Take 5 mg by mouth daily as needed.        . clopidogrel (PLAVIX) 75 MG tablet Take 75 mg by mouth daily.       Marland Kitchen donepezil (ARICEPT) 10 MG tablet Take 10 mg by mouth at bedtime.       . famotidine (PEPCID) 20 MG tablet Take 1 tablet (20 mg total) by mouth 2 (two) times daily.  60 tablet  6  . gabapentin (NEURONTIN) 100 MG capsule Take 1 capsule (  100 mg total) by mouth 3 (three) times daily.  42 capsule  1  . HYDROcodone-acetaminophen (NORCO) 5-325 MG per tablet Take 1 tablet by mouth every 6 (six) hours as needed for pain.  56 tablet  1  . isosorbide mononitrate (IMDUR) 60 MG 24 hr tablet Take 1 tablet (60 mg total) by mouth daily.  30 tablet  11  . losartan (COZAAR) 100 MG tablet Take 100 mg by mouth daily.      . metoprolol tartrate (LOPRESSOR) 25 MG tablet Take 25 mg by mouth daily.        . nitroGLYCERIN (NITROSTAT) 0.4 MG SL tablet Place 1 tablet (0.4 mg total) under the tongue every 5 (five) minutes as needed for chest pain. 1 tablet under tongue at onset of chest pain;you may repeat every 5 minutes for up to 3 doses.  30 tablet  6  . Oxcarbazepine (TRILEPTAL) 300 MG tablet Take 450 mg by mouth. And 300 mg in the morning      . oxybutynin (DITROPAN) 5 MG tablet Take 5 mg by mouth 2 (two) times daily.       . pravastatin (PRAVACHOL) 40 MG tablet Take 40 mg by mouth daily. Daily at bedtime      . PredniSONE 5 MG KIT Take 1 kit (5 mg total) by mouth as directed.  48 each  0  . psyllium (METAMUCIL SMOOTH TEXTURE) 28 %  packet Take 1 packet by mouth at bedtime.      . sennosides-docusate sodium (SENOKOT-S) 8.6-50 MG tablet Take 1 tablet by mouth 2 (two) times daily.      . Tamsulosin HCl (FLOMAX) 0.4 MG CAPS Once daily      . tiotropium (SPIRIVA HANDIHALER) 18 MCG inhalation capsule Place 1 capsule (18 mcg total) into inhaler and inhale daily.  30 capsule  11  . VITAMIN B1-B12 IM Inject into the muscle. Once a month      Last reviewed on 01/04/2012 11:51 AM by Julio Sicks, NP     Review of Systems  11 point review of systems was reviewed and is unremarkable except for history present    Objective:   Physical Exam BP 134/64  Pulse 57  Temp 97.5 F (36.4 C) (Oral)  Ht 6' (1.829 m)  Wt 196 lb 12.8 oz (89.268 kg)  BMI 26.69 kg/m2  SpO2 96%  Gen: Pleasant, elderly, in no distress,  normal affect  ENT: No lesions,  mouth clear,  oropharynx clear, no postnasal drip  Neck: No JVD, no TMG, no carotid bruits  Lungs: No use of accessory muscles, no dullness to percussion, distant BS  Cardiovascular: RRR, heart sounds normal, no murmur or gallops, no peripheral edema  Abdomen: soft and NT, no HSM,  BS normal  Musculoskeletal: No deformities, no cyanosis or clubbing  Neuro: alert, non focal  Skin: Warm, no lesions or rashes             Assessment & Plan:  No problem-specific assessment & plan notes found for this encounter.  Outpatient Encounter Prescriptions as of 01/04/2012  Medication Sig Dispense Refill  . acetaminophen (TYLENOL) 500 MG tablet Take 500 mg by mouth every 6 (six) hours as needed.        Marland Kitchen albuterol (PROAIR HFA) 108 (90 BASE) MCG/ACT inhaler 1-2 puffs every 4-6 hours as needed       . allopurinol (ZYLOPRIM) 300 MG tablet Take 300 mg by mouth daily.        Marland Kitchen amLODipine (  NORVASC) 5 MG tablet Take 2.5 mg by mouth daily.       Marland Kitchen aspirin 325 MG tablet Take 325 mg by mouth daily.        . bisacodyl (DULCOLAX) 5 MG EC tablet Take 5 mg by mouth daily as needed.         . clopidogrel (PLAVIX) 75 MG tablet Take 75 mg by mouth daily.       Marland Kitchen donepezil (ARICEPT) 10 MG tablet Take 10 mg by mouth at bedtime.       . famotidine (PEPCID) 20 MG tablet Take 1 tablet (20 mg total) by mouth 2 (two) times daily.  60 tablet  6  . gabapentin (NEURONTIN) 100 MG capsule Take 1 capsule (100 mg total) by mouth 3 (three) times daily.  42 capsule  1  . HYDROcodone-acetaminophen (NORCO) 5-325 MG per tablet Take 1 tablet by mouth every 6 (six) hours as needed for pain.  56 tablet  1  . isosorbide mononitrate (IMDUR) 60 MG 24 hr tablet Take 1 tablet (60 mg total) by mouth daily.  30 tablet  11  . losartan (COZAAR) 100 MG tablet Take 100 mg by mouth daily.      . metoprolol tartrate (LOPRESSOR) 25 MG tablet Take 25 mg by mouth daily.        . nitroGLYCERIN (NITROSTAT) 0.4 MG SL tablet Place 1 tablet (0.4 mg total) under the tongue every 5 (five) minutes as needed for chest pain. 1 tablet under tongue at onset of chest pain;you may repeat every 5 minutes for up to 3 doses.  30 tablet  6  . Oxcarbazepine (TRILEPTAL) 300 MG tablet Take 450 mg by mouth. And 300 mg in the morning      . oxybutynin (DITROPAN) 5 MG tablet Take 5 mg by mouth 2 (two) times daily.       . pravastatin (PRAVACHOL) 40 MG tablet Take 40 mg by mouth daily. Daily at bedtime      . PredniSONE 5 MG KIT Take 1 kit (5 mg total) by mouth as directed.  48 each  0  . psyllium (METAMUCIL SMOOTH TEXTURE) 28 % packet Take 1 packet by mouth at bedtime.      . sennosides-docusate sodium (SENOKOT-S) 8.6-50 MG tablet Take 1 tablet by mouth 2 (two) times daily.      . Tamsulosin HCl (FLOMAX) 0.4 MG CAPS Once daily      . tiotropium (SPIRIVA HANDIHALER) 18 MCG inhalation capsule Place 1 capsule (18 mcg total) into inhaler and inhale daily.  30 capsule  11  . VITAMIN B1-B12 IM Inject into the muscle. Once a month

## 2012-01-04 NOTE — Assessment & Plan Note (Signed)
Compensated on present regimen. Continue on Spiriva daily. Chest x-ray today for follow COPD. Followup in 6 months and as needed Flu vaccine and  Pneumovax are up-to-date

## 2012-01-04 NOTE — Patient Instructions (Addendum)
Continue current regimen.  follow up Dr. Delford Field  In 6 months and As needed   I will call with xray results.

## 2012-01-08 ENCOUNTER — Other Ambulatory Visit: Payer: Self-pay | Admitting: Cardiology

## 2012-01-16 ENCOUNTER — Encounter (HOSPITAL_COMMUNITY): Payer: Self-pay | Admitting: *Deleted

## 2012-01-16 ENCOUNTER — Inpatient Hospital Stay (HOSPITAL_COMMUNITY)
Admission: EM | Admit: 2012-01-16 | Discharge: 2012-01-17 | DRG: 312 | Disposition: A | Discharge status: Home / Self Care | Payer: Medicare Other | Attending: Internal Medicine | Admitting: Internal Medicine

## 2012-01-16 ENCOUNTER — Emergency Department (HOSPITAL_COMMUNITY): Payer: Medicare Other

## 2012-01-16 DIAGNOSIS — J438 Other emphysema: Secondary | ICD-10-CM | POA: Diagnosis present

## 2012-01-16 DIAGNOSIS — G629 Polyneuropathy, unspecified: Secondary | ICD-10-CM

## 2012-01-16 DIAGNOSIS — M109 Gout, unspecified: Secondary | ICD-10-CM | POA: Diagnosis present

## 2012-01-16 DIAGNOSIS — K219 Gastro-esophageal reflux disease without esophagitis: Secondary | ICD-10-CM | POA: Diagnosis present

## 2012-01-16 DIAGNOSIS — Z880 Allergy status to penicillin: Secondary | ICD-10-CM

## 2012-01-16 DIAGNOSIS — I129 Hypertensive chronic kidney disease with stage 1 through stage 4 chronic kidney disease, or unspecified chronic kidney disease: Secondary | ICD-10-CM | POA: Diagnosis present

## 2012-01-16 DIAGNOSIS — M79606 Pain in leg, unspecified: Secondary | ICD-10-CM

## 2012-01-16 DIAGNOSIS — I2510 Atherosclerotic heart disease of native coronary artery without angina pectoris: Secondary | ICD-10-CM | POA: Diagnosis present

## 2012-01-16 DIAGNOSIS — R55 Syncope and collapse: Principal | ICD-10-CM | POA: Diagnosis present

## 2012-01-16 DIAGNOSIS — Z87891 Personal history of nicotine dependence: Secondary | ICD-10-CM

## 2012-01-16 DIAGNOSIS — M5137 Other intervertebral disc degeneration, lumbosacral region: Secondary | ICD-10-CM

## 2012-01-16 DIAGNOSIS — G40909 Epilepsy, unspecified, not intractable, without status epilepticus: Secondary | ICD-10-CM | POA: Diagnosis present

## 2012-01-16 DIAGNOSIS — E871 Hypo-osmolality and hyponatremia: Secondary | ICD-10-CM | POA: Diagnosis present

## 2012-01-16 DIAGNOSIS — N189 Chronic kidney disease, unspecified: Secondary | ICD-10-CM | POA: Diagnosis present

## 2012-01-16 DIAGNOSIS — E785 Hyperlipidemia, unspecified: Secondary | ICD-10-CM | POA: Diagnosis present

## 2012-01-16 DIAGNOSIS — Z8673 Personal history of transient ischemic attack (TIA), and cerebral infarction without residual deficits: Secondary | ICD-10-CM

## 2012-01-16 HISTORY — DX: Syncope and collapse: R55

## 2012-01-16 LAB — PROTIME-INR: INR: 0.96 (ref 0.00–1.49)

## 2012-01-16 LAB — CBC WITH DIFFERENTIAL/PLATELET
Basophils Absolute: 0 10*3/uL (ref 0.0–0.1)
Basophils Relative: 0 % (ref 0–1)
Eosinophils Relative: 1 % (ref 0–5)
HCT: 36.7 % — ABNORMAL LOW (ref 39.0–52.0)
Lymphocytes Relative: 13 % (ref 12–46)
MCHC: 35.1 g/dL (ref 30.0–36.0)
MCV: 97.9 fL (ref 78.0–100.0)
Monocytes Absolute: 1.1 10*3/uL — ABNORMAL HIGH (ref 0.1–1.0)
Platelets: 117 10*3/uL — ABNORMAL LOW (ref 150–400)
RDW: 14.1 % (ref 11.5–15.5)
WBC: 9.7 10*3/uL (ref 4.0–10.5)

## 2012-01-16 LAB — COMPREHENSIVE METABOLIC PANEL
ALT: 32 U/L (ref 0–53)
AST: 14 U/L (ref 0–37)
Albumin: 3.2 g/dL — ABNORMAL LOW (ref 3.5–5.2)
Calcium: 8.5 mg/dL (ref 8.4–10.5)
Creatinine, Ser: 1.49 mg/dL — ABNORMAL HIGH (ref 0.50–1.35)
Sodium: 130 mEq/L — ABNORMAL LOW (ref 135–145)
Total Protein: 5.6 g/dL — ABNORMAL LOW (ref 6.0–8.3)

## 2012-01-16 LAB — TROPONIN I: Troponin I: <0.3 ng/mL (ref <0.30)

## 2012-01-16 MED ORDER — OXCARBAZEPINE 300 MG PO TABS
450.0000 mg | ORAL_TABLET | Freq: Every day | ORAL | Status: DC
  Administered 2012-01-16: 450 mg via ORAL
  Filled 2012-01-16 (×2): qty 1.5

## 2012-01-16 MED ORDER — OXCARBAZEPINE 300 MG PO TABS
300.0000 mg | ORAL_TABLET | Freq: Every day | ORAL | Status: DC
  Filled 2012-01-16 (×2): qty 1

## 2012-01-16 MED ORDER — ASPIRIN 325 MG PO TABS: 325.0000 mg | ORAL_TABLET | Freq: Every day | ORAL | Status: DC

## 2012-01-16 MED ORDER — ISOSORBIDE MONONITRATE ER 60 MG PO TB24: 60.0000 mg | ORAL_TABLET | Freq: Every day | ORAL | Status: DC

## 2012-01-16 MED ORDER — TIOTROPIUM BROMIDE MONOHYDRATE 18 MCG IN CAPS
18.0000 ug | ORAL_CAPSULE | Freq: Every day | RESPIRATORY_TRACT | Status: DC
  Filled 2012-01-16: qty 5

## 2012-01-16 MED ORDER — CLOPIDOGREL BISULFATE 75 MG PO TABS
75.0000 mg | ORAL_TABLET | Freq: Every day | ORAL | Status: DC
  Administered 2012-01-17: 75 mg via ORAL
  Filled 2012-01-16: qty 1

## 2012-01-16 MED ORDER — PSYLLIUM 95 % PO PACK
PACK | ORAL | Status: AC
  Filled 2012-01-16: qty 1

## 2012-01-16 MED ORDER — LOSARTAN POTASSIUM 50 MG PO TABS: 100.0000 mg | ORAL_TABLET | Freq: Every day | ORAL | Status: DC

## 2012-01-16 MED ORDER — OXYBUTYNIN CHLORIDE 5 MG PO TABS
5.0000 mg | ORAL_TABLET | Freq: Two times a day (BID) | ORAL | Status: DC
  Administered 2012-01-16: 5 mg via ORAL
  Filled 2012-01-16: qty 1

## 2012-01-16 MED ORDER — PSYLLIUM 95 % PO PACK
1.0000 | PACK | Freq: Every day | ORAL | Status: DC
  Administered 2012-01-16: 1 via ORAL
  Filled 2012-01-16 (×2): qty 1

## 2012-01-16 MED ORDER — SODIUM CHLORIDE 0.9 % IJ SOLN
3.0000 mL | Freq: Two times a day (BID) | INTRAMUSCULAR | Status: DC
  Administered 2012-01-16: 3 mL via INTRAVENOUS

## 2012-01-16 MED ORDER — ALLOPURINOL 300 MG PO TABS: 300.0000 mg | ORAL_TABLET | Freq: Every day | ORAL | Status: DC

## 2012-01-16 MED ORDER — GABAPENTIN 100 MG PO CAPS
100.0000 mg | ORAL_CAPSULE | Freq: Three times a day (TID) | ORAL | Status: DC
  Administered 2012-01-16 (×2): 100 mg via ORAL
  Filled 2012-01-16 (×2): qty 1

## 2012-01-16 MED ORDER — ACETAMINOPHEN 325 MG PO TABS: 650.0000 mg | ORAL_TABLET | Freq: Four times a day (QID) | ORAL | Status: DC | PRN

## 2012-01-16 MED ORDER — ACETAMINOPHEN 650 MG RE SUPP: 650.0000 mg | Freq: Four times a day (QID) | RECTAL | Status: DC | PRN

## 2012-01-16 MED ORDER — METOPROLOL TARTRATE 25 MG PO TABS: 25.0000 mg | ORAL_TABLET | Freq: Every day | ORAL | Status: DC

## 2012-01-16 MED ORDER — NITROGLYCERIN 0.4 MG SL SUBL: 0.4000 mg | SUBLINGUAL_TABLET | SUBLINGUAL | Status: DC | PRN

## 2012-01-16 MED ORDER — HYDROCODONE-ACETAMINOPHEN 5-325 MG PO TABS
1.0000 | ORAL_TABLET | ORAL | Status: DC | PRN
  Administered 2012-01-16: 2 via ORAL
  Filled 2012-01-16: qty 2

## 2012-01-16 MED ORDER — ALUM & MAG HYDROXIDE-SIMETH 200-200-20 MG/5ML PO SUSP: 30.0000 mL | Freq: Four times a day (QID) | ORAL | Status: DC | PRN

## 2012-01-16 MED ORDER — ONDANSETRON HCL 4 MG PO TABS: 4.0000 mg | ORAL_TABLET | Freq: Four times a day (QID) | ORAL | Status: DC | PRN

## 2012-01-16 MED ORDER — SENNOSIDES-DOCUSATE SODIUM 8.6-50 MG PO TABS
1.0000 | ORAL_TABLET | Freq: Two times a day (BID) | ORAL | Status: DC
  Administered 2012-01-16: 1 via ORAL
  Filled 2012-01-16: qty 1

## 2012-01-16 MED ORDER — ENOXAPARIN SODIUM 40 MG/0.4ML ~~LOC~~ SOLN
40.0000 mg | SUBCUTANEOUS | Status: DC
  Administered 2012-01-16: 40 mg via SUBCUTANEOUS
  Filled 2012-01-16: qty 0.4

## 2012-01-16 MED ORDER — ONDANSETRON HCL 4 MG/2ML IJ SOLN: 4.0000 mg | Freq: Four times a day (QID) | INTRAMUSCULAR | Status: DC | PRN

## 2012-01-16 MED ORDER — BISACODYL 5 MG PO TBEC: 5.0000 mg | DELAYED_RELEASE_TABLET | Freq: Every day | ORAL | Status: DC | PRN

## 2012-01-16 MED ORDER — SIMVASTATIN 20 MG PO TABS
20.0000 mg | ORAL_TABLET | Freq: Every day | ORAL | Status: DC
  Administered 2012-01-16: 20 mg via ORAL
  Filled 2012-01-16: qty 1

## 2012-01-16 MED ORDER — TAMSULOSIN HCL 0.4 MG PO CAPS: 0.4000 mg | ORAL_CAPSULE | Freq: Every day | ORAL | Status: DC

## 2012-01-16 MED ORDER — ALBUTEROL SULFATE HFA 108 (90 BASE) MCG/ACT IN AERS: 1.0000 | INHALATION_SPRAY | RESPIRATORY_TRACT | Status: DC | PRN

## 2012-01-16 MED ORDER — SODIUM CHLORIDE 0.9 % IV SOLN
Freq: Once | INTRAVENOUS | Status: AC
  Administered 2012-01-16: 12:00:00 via INTRAVENOUS

## 2012-01-16 MED ORDER — AMLODIPINE BESYLATE 5 MG PO TABS: 2.5000 mg | ORAL_TABLET | Freq: Every day | ORAL | Status: DC

## 2012-01-16 MED ORDER — OXCARBAZEPINE 300 MG PO TABS
ORAL_TABLET | ORAL | Status: AC
  Filled 2012-01-16: qty 2

## 2012-01-16 MED ORDER — DONEPEZIL HCL 5 MG PO TABS
10.0000 mg | ORAL_TABLET | Freq: Every day | ORAL | Status: DC
  Administered 2012-01-16: 10 mg via ORAL
  Filled 2012-01-16: qty 2

## 2012-01-16 NOTE — ED Notes (Signed)
Floor able to take report,

## 2012-01-16 NOTE — H&P (Signed)
[name] MRN: [id number] DOB/AGE: 1921/07/01 79 y.o. Primary Care Physician:FAGAN,ROY, MD Admit date: 01/16/2012 Chief Complaint: Syncope HPI: This is an 79 year old who was in his usual state of health and was talking to a family member when he looked like he went to sleep but then it was clear that he was unresponsive. He had some cyanosis of his lips and wasn't breathing well. His family member pulled him into the floor and got his mouth open and he started breathing better. EMS was called and he was brought to the hospital. He had no seizure activity. He denies any symptoms now. He has no memory of the event. His family member said that his speech seemed a little different prior to this event. He has a past medical history as listed below but has not had any chest pain and has no focal neurological abnormalities .  Past Medical History  Diagnosis Date  . Gout   . Stroke 1993, 1995    chronic balance issues  . Hyperlipidemia   . Emphysema   . Hypertension   . CAD (coronary artery disease)     stent 2006  . Seizure disorder   . GERD (gastroesophageal reflux disease)    Past Surgical History  Procedure Date  . Cardiac catheterization 08/23/2004    bare metal stenting of the proximal circumflex  . Colonoscopy     X 2  . Colonoscopy 11/01/2011    Procedure: COLONOSCOPY;  Surgeon: Malissa Hippo, MD;  Location: [facility];  Service: Endoscopy;  Laterality: N/A;  100  . Eye surgery   . Nose surgery         Family History  Problem Relation Age of Onset  . Emphysema Brother   . Heart disease Sister   . Cancer Brother     lung  . Cancer Brother     throat  . Rheum arthritis Mother   . Rheum arthritis Father   . Colon cancer Neg Hx   . Diabetes      Social History:  reports that he quit smoking about 22 years ago. His smoking use included Cigarettes. He has a 15 pack-year smoking history. He has never used smokeless tobacco. He reports that he does not drink alcohol or  use illicit drugs.   Allergies:  Allergies  Allergen Reactions  . Penicillins     itching     (Not in a hospital admission)     WUJ:WJXBJ from the symptoms mentioned above,there are no other symptoms referable to all systems reviewed.  Physical Exam: Blood pressure 115/55, pulse 56, temperature 97.6 F (36.4 C), temperature source Oral, resp. rate 20, SpO2 100.00%. He is awake and alert. His pupils are reactive. Nose and throat clear. His mucous membranes are moist. Extraocular muscle movement is intact. His neck is supple without masses bruits or JVD. His chest is clear. His heart is regular without murmur gallop or rub. His abdomen is soft without masses. Extremities showed no edema. Central nervous system examination is grossly intact and he is back to baseline according to his family    Basename 01/16/12 1203  WBC 9.7  NEUTROABS 7.2  HGB 12.9*  HCT 36.7*  MCV 97.9  PLT 117*    Basename 01/16/12 1203  NA 130*  K 3.9  CL 97  CO2 27  GLUCOSE 124*  BUN 17  CREATININE 1.49*  CALCIUM 8.5  MG --  lablast2(ast:2,ALT:2,alkphos:2,bilitot:2,prot:2,albumin:2)@    No results found for this or any previous visit (  from the past 240 hour(s)).   Dg Chest 2 View  01/04/2012  *RADIOLOGY REPORT*  Clinical Data: COPD, emphysema  CHEST - 2 VIEW  Comparison: 06/08/2010  Findings: Stable heart size and vascularity.  Background COPD/emphysema noted with basilar scarring.  No focal pneumonia, collapse, consolidation, edema, effusion, pneumothorax.  Trachea midline.  Atherosclerosis of the aorta.  IMPRESSION: Stable COPD/emphysema with parenchymal scarring.  No superimposed acute process   Original Report Authenticated By: Judie Petit. Shick, M.D.    Dg Lumbar Spine 2-3 Views  01/01/2012  3 views, lumbar spine, LEFT lower extremity radicular pain  Lateral x-ray  The lumbar lordotic curve has been lost. There are multiple levels of  degenerative disc disease with various levels of anterior  endplate  spurring and joint space narrowing.  [facility] x-ray  The overall coronal plane alignment is abnormal as well with significant  changes of degenerative disc disease most notable at L2 and 3, L3, and 4.  Spot film  Spondylolisthesis is noted at L5-S1 with possibility of spondylolyses. It  is a grade 1 slip. It appears to be chronic. There appears to be anterior  spur formation and degenerative disc disease with joint space narrowing at  L5-S1 as well.  Impression spondylosis, lumbar spine with degenerative disc disease, and  possible spondylo-, lysis At L5, S1, and spondylolisthesis noted at L5, S1      Dg Hip Complete Left  12/21/2011  *RADIOLOGY REPORT*  Clinical Data: Low back and left hip and leg pain.  LEFT HIP - COMPLETE 2+ VIEW  Comparison: Plain films 04/28/2009.  Findings: Both hips are located.  There is no fracture.  Mild to moderate left hip degenerative change is stable in appearance. Minimal right hip degenerative disease is seen.  Symphysis pubis and sacroiliac joints appear normal.  IMPRESSION: No change in mild to moderate left hip osteoarthritis.  No new abnormality.   Original Report Authenticated By: Holley Dexter, M.D.    Ct Head Wo Contrast  01/16/2012  *RADIOLOGY REPORT*  Clinical Data: Syncope.  Hypertension.  Previous strokes.  CT HEAD WITHOUT CONTRAST  Technique:  Contiguous axial images were obtained from the base of the skull through the vertex without contrast.  Comparison: 10/18/2009  Findings: There is no evidence of intracranial hemorrhage, brain edema or other signs of acute infarction.  There is no evidence of intracranial mass lesion or mass effect.  No abnormal extra-axial fluid collections are identified.  Mild cerebral atrophy and chronic small vessel disease is stable in appearance.  Multiple old lacunar infarcts are seen bilaterally no evidence of hydrocephalus.  No skull fracture or other significant bone abnormality identified.  IMPRESSION:  1.  No acute  intracranial findings. 2.  Cerebral atrophy, chronic small vessel disease and old lacunar infarcts.   Original Report Authenticated By: Myles Rosenthal, M.D.    Dg Chest Portable 1 View  01/16/2012  *RADIOLOGY REPORT*  Clinical Data: Loss of consciousness.  History of hypertension, stroke.  PORTABLE CHEST - 1 VIEW  Comparison: 01/04/2012  Findings: The heart is enlarged.  There is a hiatal hernia.  There are no focal consolidations or pleural effusions.  There is bibasilar atelectasis. Suspect emphysematous changes at the apices.  IMPRESSION: Cardiomegaly without pulmonary edema. Suspect emphysema.   Original Report Authenticated By: Norva Pavlov, M.D.    Impression: He had an episode of unresponsiveness. It is not totally clear what this is from. He has a history of stroke in the past. He has a history of seizure disorder  but had no seizure activity. He denies chest pain Active Problems:  * No active hospital problems. *      Plan: He's going to be admitted to a monitored bed. He will have carotid Dopplers echocardiogram et Karie Soda.      Estefani Bateson L Pager [phone]  01/16/2012, 2:45 PM

## 2012-01-16 NOTE — ED Notes (Signed)
Floor unable to take report, will return call to er when able.

## 2012-01-16 NOTE — ED Notes (Signed)
Dr. Juanetta Gosling at bedside with pt and family

## 2012-01-16 NOTE — ED Notes (Addendum)
Pt brought to er by [facility] with c/o "passing out" according to family, pt was sitting in recliner, went unresponsive, color turned blue in lips for about 5 minutes before being able to be "aroused", pt was alert with family after the episode, on arrival to er pt alert, able to answer questions, states "I don't feel good" and weakness, has hx of chronic back pain denies any pain at present, family at bedside,

## 2012-01-16 NOTE — ED Notes (Signed)
Pt and family updated on plan of care  

## 2012-01-16 NOTE — ED Notes (Signed)
Pt was sitting in chair talking and his head suddenly dropped and lips turned blue per EMS. Family member lowered pt to floor. Pt awake and alert when EMS arrived on scene.

## 2012-01-16 NOTE — Plan of Care (Signed)
Problem: Phase I Progression Outcomes Goal: Pain controlled with appropriate interventions Outcome: Completed/Met Date Met:  01/16/12 Denies pain

## 2012-01-16 NOTE — ED Notes (Signed)
Dr Zammit at bedside,  

## 2012-01-16 NOTE — ED Provider Notes (Signed)
History   This chart was scribed for James Lennert, MD, by Frederik Pear, ER scribe. The patient was seen in room APA16A/AP[facility]16A and the patient's care was started at 1339.    CSN: [id number]  Arrival date & time 01/16/12  1130   First MD Initiated Contact with Patient 01/16/12 1339      Chief Complaint  Patient presents with  . Loss of Consciousness    (Consider location/radiation/quality/duration/timing/severity/associated sxs/prior treatment) Patient is a 79 y.o. male presenting with syncope. The history is provided by the patient and a relative. No language interpreter was used.  Loss of Consciousness This is a new problem. The current episode started 3 to 5 hours ago. The problem occurs constantly. The problem has been gradually improving. Pertinent negatives include no chest pain, no abdominal pain and no headaches. Nothing aggravates the symptoms. Nothing relieves the symptoms. He has tried nothing for the symptoms.    [name] is a 79 y.o. male with a h/o of COPD brought in by ambulance, who presents to the Emergency Department complaining of syncopal episode that lasted 5 minutes PTA. His grandson reports that he was in the middle of a conversation while his grandfather was sitting in a chair when his head suddenly dropped and his lips started to turn blue. He reports that the color began to return to his lips by the time EMS arrived. He has no h/o of similar symptoms.  PCP is Dr. Ouida Sills. Cardiologist is Dr. Runell Gess.   Past Medical History  Diagnosis Date  . Gout   . Stroke 1993, 1995    chronic balance issues  . Hyperlipidemia   . Emphysema   . Hypertension   . CAD (coronary artery disease)     stent 2006  . Seizure disorder   . GERD (gastroesophageal reflux disease)     Past Surgical History  Procedure Date  . Cardiac catheterization 08/23/2004    bare metal stenting of the proximal circumflex  . Colonoscopy     X 2  . Colonoscopy 11/01/2011    Procedure:  COLONOSCOPY;  Surgeon: Malissa Hippo, MD;  Location: [facility];  Service: Endoscopy;  Laterality: N/A;  100  . Eye surgery   . Nose surgery     Family History  Problem Relation Age of Onset  . Emphysema Brother   . Heart disease Sister   . Cancer Brother     lung  . Cancer Brother     throat  . Rheum arthritis Mother   . Rheum arthritis Father   . Colon cancer Neg Hx   . Diabetes      History  Substance Use Topics  . Smoking status: Former Smoker -- 0.3 packs/day for 50 years    Types: Cigarettes    Quit date: 01/15/1990  . Smokeless tobacco: Never Used  . Alcohol Use: No      Review of Systems  Constitutional: Negative for fatigue.  HENT: Negative for congestion, sinus pressure and ear discharge.   Eyes: Negative for discharge.  Respiratory: Negative for cough.   Cardiovascular: Positive for syncope. Negative for chest pain.  Gastrointestinal: Negative for abdominal pain and diarrhea.  Genitourinary: Negative for frequency and hematuria.  Musculoskeletal: Negative for back pain.  Skin: Negative for rash.  Neurological: Positive for syncope. Negative for seizures and headaches.  Hematological: Negative.   Psychiatric/Behavioral: Negative for hallucinations.  All other systems reviewed and are negative.    Allergies  Penicillins  Home Medications  Current Outpatient Rx  Name  Route  Sig  Dispense  Refill  . ACETAMINOPHEN 500 MG PO TABS   Oral   Take 500 mg by mouth every 6 (six) hours as needed. Pain.         . ALBUTEROL SULFATE HFA 108 (90 BASE) MCG/ACT IN AERS   Inhalation   Inhale 1-2 puffs into the lungs every 4 (four) hours as needed. Shortness of breath.         . ALLOPURINOL 300 MG PO TABS   Oral   Take 300 mg by mouth daily.           Marland Kitchen AMLODIPINE BESYLATE 5 MG PO TABS   Oral   Take 2.5 mg by mouth daily.          . ASPIRIN 325 MG PO TABS   Oral   Take 325 mg by mouth daily.           Marland Kitchen BISACODYL 5 MG PO TBEC   Oral    Take 5 mg by mouth daily as needed. Laxative.         . CLOPIDOGREL BISULFATE 75 MG PO TABS   Oral   Take 75 mg by mouth daily.          . DONEPEZIL HCL 10 MG PO TABS   Oral   Take 10 mg by mouth at bedtime.          Marland Kitchen FAMOTIDINE 20 MG PO TABS   Oral   Take 1 tablet (20 mg total) by mouth 2 (two) times daily.   60 tablet   6   . GABAPENTIN 100 MG PO CAPS   Oral   Take 1 capsule (100 mg total) by mouth 3 (three) times daily.   42 capsule   1   . HYDROCODONE-ACETAMINOPHEN 5-325 MG PO TABS   Oral   Take 1 tablet by mouth every 6 (six) hours as needed for pain.   56 tablet   1   . ISOSORBIDE MONONITRATE ER 60 MG PO TB24   Oral   Take 1 tablet (60 mg total) by mouth daily.   30 tablet   11   . LOSARTAN POTASSIUM 100 MG PO TABS   Oral   Take 100 mg by mouth daily.         Marland Kitchen METOPROLOL TARTRATE 25 MG PO TABS   Oral   Take 25 mg by mouth daily.           Marland Kitchen NITROGLYCERIN 0.4 MG SL SUBL   Sublingual   Place 1 tablet (0.4 mg total) under the tongue every 5 (five) minutes as needed for chest pain. 1 tablet under tongue at onset of chest pain;you may repeat every 5 minutes for up to 3 doses.   30 tablet   6   . OXCARBAZEPINE 300 MG PO TABS   Oral   Take 300-450 mg by mouth 2 (two) times daily. 300 in the AM; 450 in the PM.         . OXYBUTYNIN CHLORIDE 5 MG PO TABS   Oral   Take 5 mg by mouth 2 (two) times daily.          Marland Kitchen PRAVASTATIN SODIUM 40 MG PO TABS   Oral   Take 40 mg by mouth daily. Daily at bedtime         . PSYLLIUM 28 % PO PACK   Oral   Take 1 packet by mouth at bedtime.         Marland Kitchen  SENNA-DOCUSATE SODIUM 8.6-50 MG PO TABS   Oral   Take 1 tablet by mouth 2 (two) times daily.         Marland Kitchen TAMSULOSIN HCL 0.4 MG PO CAPS   Oral   Take 0.4 mg by mouth daily.          Marland Kitchen TIOTROPIUM BROMIDE MONOHYDRATE 18 MCG IN CAPS   Inhalation   Place 1 capsule (18 mcg total) into inhaler and inhale daily.   30 capsule   11   . VITAMIN B1-B12  IM   Intramuscular   Inject into the muscle. Once a month           BP 115/55  Pulse 56  Temp 97.6 F (36.4 C) (Oral)  Resp 20  SpO2 100%  Physical Exam  Nursing note and vitals reviewed. Constitutional: He is oriented to person, place, and time. He appears well-developed.  HENT:  Head: Normocephalic and atraumatic.  Eyes: Conjunctivae normal and EOM are normal. No scleral icterus.  Neck: Neck supple. No thyromegaly present.  Cardiovascular: Normal rate and regular rhythm.  Exam reveals no gallop and no friction rub.   No murmur heard. Pulmonary/Chest: No stridor. He has no wheezes. He has no rales. He exhibits no tenderness.  Abdominal: He exhibits no distension. There is no tenderness. There is no rebound.  Musculoskeletal: Normal range of motion. He exhibits no edema.  Lymphadenopathy:    He has no cervical adenopathy.  Neurological: He is oriented to person, place, and time. Coordination normal.  Skin: No rash noted. No erythema.  Psychiatric: He has a normal mood and affect. His behavior is normal.    ED Course  Procedures (including critical care time)  DIAGNOSTIC STUDIES: Oxygen Saturation is 100% on room air, normal by my interpretation.    COORDINATION OF CARE:  13:49- Discussed planned course of treatment with the patient, including blood work, head CT, and IV fluids, who is agreeable at this time.  Results for orders placed during the hospital encounter of 01/16/12  CBC WITH DIFFERENTIAL      Component Value Range   WBC 9.7  4.0 - 10.5 K/uL   RBC 3.75 (*) 4.22 - 5.81 MIL/uL   Hemoglobin 12.9 (*) 13.0 - 17.0 g/dL   HCT 14.7 (*) 82.9 - 56.2 %   MCV 97.9  78.0 - 100.0 fL   MCH 34.4 (*) 26.0 - 34.0 pg   MCHC 35.1  30.0 - 36.0 g/dL   RDW 13.0  86.5 - 78.4 %   Platelets 117 (*) 150 - 400 K/uL   Neutrophils Relative 74  43 - 77 %   Neutro Abs 7.2  1.7 - 7.7 K/uL   Lymphocytes Relative 13  12 - 46 %   Lymphs Abs 1.2  0.7 - 4.0 K/uL   Monocytes Relative  11  3 - 12 %   Monocytes Absolute 1.1 (*) 0.1 - 1.0 K/uL   Eosinophils Relative 1  0 - 5 %   Eosinophils Absolute 0.1  0.0 - 0.7 K/uL   Basophils Relative 0  0 - 1 %   Basophils Absolute 0.0  0.0 - 0.1 K/uL  COMPREHENSIVE METABOLIC PANEL      Component Value Range   Sodium 130 (*) 135 - 145 mEq/L   Potassium 3.9  3.5 - 5.1 mEq/L   Chloride 97  96 - 112 mEq/L   CO2 27  19 - 32 mEq/L   Glucose, Bld 124 (*) 70 - 99 mg/dL  BUN 17  6 - 23 mg/dL   Creatinine, Ser 4.54 (*) 0.50 - 1.35 mg/dL   Calcium 8.5  8.4 - 09.8 mg/dL   Total Protein 5.6 (*) 6.0 - 8.3 g/dL   Albumin 3.2 (*) 3.5 - 5.2 g/dL   AST 14  0 - 37 U/L   ALT 32  0 - 53 U/L   Alkaline Phosphatase 45  39 - 117 U/L   Total Bilirubin 0.3  0.3 - 1.2 mg/dL   GFR calc non Af Amer 40 (*) >90 mL/min   GFR calc Af Amer 46 (*) >90 mL/min  PROTIME-INR      Component Value Range   Prothrombin Time 12.7  11.6 - 15.2 seconds   INR 0.96  0.00 - 1.49  TROPONIN I      Component Value Range   Troponin I <0.30  <0.30 ng/mL    Labs Reviewed  CBC WITH DIFFERENTIAL - Abnormal; Notable for the following:    RBC 3.75 (*)     Hemoglobin 12.9 (*)     HCT 36.7 (*)     MCH 34.4 (*)     Platelets 117 (*)     Monocytes Absolute 1.1 (*)     All other components within normal limits  COMPREHENSIVE METABOLIC PANEL - Abnormal; Notable for the following:    Sodium 130 (*)     Glucose, Bld 124 (*)     Creatinine, Ser 1.49 (*)     Total Protein 5.6 (*)     Albumin 3.2 (*)     GFR calc non Af Amer 40 (*)     GFR calc Af Amer 46 (*)     All other components within normal limits  PROTIME-INR  TROPONIN I   Ct Head Wo Contrast  01/16/2012  *RADIOLOGY REPORT*  Clinical Data: Syncope.  Hypertension.  Previous strokes.  CT HEAD WITHOUT CONTRAST  Technique:  Contiguous axial images were obtained from the base of the skull through the vertex without contrast.  Comparison: 10/18/2009  Findings: There is no evidence of intracranial hemorrhage, brain edema  or other signs of acute infarction.  There is no evidence of intracranial mass lesion or mass effect.  No abnormal extra-axial fluid collections are identified.  Mild cerebral atrophy and chronic small vessel disease is stable in appearance.  Multiple old lacunar infarcts are seen bilaterally no evidence of hydrocephalus.  No skull fracture or other significant bone abnormality identified.  IMPRESSION:  1.  No acute intracranial findings. 2.  Cerebral atrophy, chronic small vessel disease and old lacunar infarcts.   Original Report Authenticated By: Myles Rosenthal, M.D.    Dg Chest Portable 1 View  01/16/2012  *RADIOLOGY REPORT*  Clinical Data: Loss of consciousness.  History of hypertension, stroke.  PORTABLE CHEST - 1 VIEW  Comparison: 01/04/2012  Findings: The heart is enlarged.  There is a hiatal hernia.  There are no focal consolidations or pleural effusions.  There is bibasilar atelectasis. Suspect emphysematous changes at the apices.  IMPRESSION: Cardiomegaly without pulmonary edema. Suspect emphysema.   Original Report Authenticated By: Norva Pavlov, M.D.      No diagnosis found.    Date: 01/16/2012  Rate: 57  Rhythm: normal sinus rhythm  QRS Axis: normal  Intervals: PR prolonged  ST/T Wave abnormalities: normal  Conduction Disutrbances:first-degree A-V block   Narrative Interpretation:   Old EKG Reviewed: none available   MDM  The chart was scribed for me under my direct supervision.  I personally  performed the history, physical, and medical decision making and all procedures in the evaluation of this patient.James Lennert, MD 01/16/12 [phone]

## 2012-01-17 ENCOUNTER — Inpatient Hospital Stay (HOSPITAL_COMMUNITY): Payer: Medicare Other

## 2012-01-17 LAB — BASIC METABOLIC PANEL
BUN: 16 mg/dL (ref 6–23)
Calcium: 8.4 mg/dL (ref 8.4–10.5)
Creatinine, Ser: 1.49 mg/dL — ABNORMAL HIGH (ref 0.50–1.35)
GFR calc Af Amer: 46 mL/min — ABNORMAL LOW (ref >90)
GFR calc non Af Amer: 40 mL/min — ABNORMAL LOW (ref >90)
Glucose, Bld: 89 mg/dL (ref 70–99)

## 2012-01-17 LAB — CBC
MCH: 34.2 pg — ABNORMAL HIGH (ref 26.0–34.0)
MCHC: 35 g/dL (ref 30.0–36.0)
Platelets: 138 10*3/uL — ABNORMAL LOW (ref 150–400)
RDW: 14.2 % (ref 11.5–15.5)

## 2012-01-17 NOTE — Discharge Summary (Signed)
[name], [name] NO.:  [id number]  MEDICAL RECORD NO.:  [id number]  LOCATION:                                 FACILITY:  PHYSICIAN:  [name], MD       DATE OF BIRTH:  [birth year]  DATE OF ADMISSION:  01/16/2012 DATE OF DISCHARGE:  01/02/2014LH                              DISCHARGE SUMMARY   DISCHARGE DIAGNOSES: 1. Syncope. 2. History of seizure disorder. 3. History of stroke. 4. Chronic obstructive pulmonary disease. 5. Gout. 6. Hyperlipidemia. 7. Hypertension. 8. Coronary artery disease. 9. Gastroesophageal reflux disease. 10.Chronic kidney disease.  PROCEDURES:  CT scan of the head.  DISCHARGE MEDICATIONS:  Acetaminophen 500 mg q.6  hours p.r.n., ProAir 2 puffs q.4 p.r.n., allopurinol 300 mg daily,  amlodipine 2.5 mg daily, aspirin 325 mg daily, Dulcolax 5 mg daily p.r.n., Plavix 75 mg daily, Aricept 10 mg daily, Pepcid 20 mg b.i.d., Neurontin 100 mg t.i.d., Norco 5/325 q.6 p.r.n., Imdur 60 mg daily, Cozaar 100 mg daily, Lopressor 25 mg b.i.d., sublingual nitroglycerin 0.4 mg p.r.n., Trileptal 300 mg in the morning, 450 mg at night, Ditropan 5 mg b.i.d., pravastatin 40 mg daily, Senokot-S b.i.d. p.r.n., Flomax 0.4 mg daily,  vitamin B12 1000 mcg monthly.  HOSPITAL COURSE:  This patient is an 79 year old male who presented to the emergency room after having a syncopal episode at home.  The patient was sitting in his chair when his head slumped and his lips turned blue. He was helped to the floor.  He was poorly responsive for several minutes and then returned to  consciousness.  There was no tongue biting or incontinence.  He denied having experienced any headache, chest pain or difficulty breathing.  There had been no obvious seizure activity. Upon awakening, he had no difficulties with slurred speech, weakness or sensory deficits.  He was evaluated in the emergency room.  A CT scan of the brain revealed no acute abnormalities.  He does  have cerebral atrophy, chronic small vessel changes and old lacunar infarcts.  He was observed with cardiac monitoring and frequent neurologic checks, and had no abnormalities identified.  He was mildly hyponatremic at 130 with a repeat sodium level of 133, has chronic kidney disease with a stable creatinine at 1.49.  His white counts were 9.7 and 6.4, hemoglobin of 12.9 and 12.5.  He was mildly thrombocytopenic with platelet counts of 117,000 and 138,000.  He had an elevated D-dimer of 1.56, but no symptoms or signs of DVT or PE.  His troponin I was normal at less than 0.30.  His EKG revealed sinus rhythm with first-degree AV block.  The patient was in improved and stable condition on the morning of the 2nd.  He requested  discharge.  He will be discharged home and will follow up in the office in 1 week.  He will have arrangements made for followup with his neurologist.     [name], MD     ROF/MEDQ  D:  01/17/2012  T:  01/17/2012  Job:  [id number]

## 2012-01-17 NOTE — Progress Notes (Signed)
Discharge instructions given to pt.with teach back given to RN. Pt. Placed in W/C and taken to car.

## 2012-01-17 NOTE — Progress Notes (Signed)
UR Chart Review Completed  

## 2012-01-22 ENCOUNTER — Ambulatory Visit: Payer: Medicare Other | Admitting: Orthopedic Surgery

## 2012-01-24 ENCOUNTER — Ambulatory Visit (HOSPITAL_COMMUNITY)
Admission: RE | Admit: 2012-01-24 | Discharge: 2012-01-24 | Disposition: A | Discharge status: Home / Self Care | Payer: Medicare Other | Source: Ambulatory Visit | Attending: Internal Medicine | Admitting: Internal Medicine

## 2012-01-24 ENCOUNTER — Encounter (HOSPITAL_COMMUNITY): Payer: Self-pay

## 2012-01-24 ENCOUNTER — Other Ambulatory Visit (HOSPITAL_COMMUNITY): Payer: Self-pay | Admitting: Internal Medicine

## 2012-01-24 ENCOUNTER — Encounter (HOSPITAL_COMMUNITY)
Admission: RE | Admit: 2012-01-24 | Discharge: 2012-01-24 | Disposition: A | Discharge status: Home / Self Care | Payer: Medicare Other | Source: Ambulatory Visit | Attending: Internal Medicine | Admitting: Internal Medicine

## 2012-01-24 DIAGNOSIS — R0602 Shortness of breath: Secondary | ICD-10-CM

## 2012-01-24 DIAGNOSIS — R079 Chest pain, unspecified: Secondary | ICD-10-CM | POA: Insufficient documentation

## 2012-01-24 DIAGNOSIS — R7989 Other specified abnormal findings of blood chemistry: Secondary | ICD-10-CM

## 2012-01-24 HISTORY — DX: Malignant (primary) neoplasm, unspecified: C80.1

## 2012-01-24 HISTORY — DX: Unspecified asthma, uncomplicated: J45.909

## 2012-01-24 MED ORDER — TECHNETIUM TC 99M DIETHYLENETRIAME-PENTAACETIC ACID
40.0000 | Freq: Once | INTRAVENOUS | Status: AC | PRN
  Administered 2012-01-24: 38 via RESPIRATORY_TRACT

## 2012-01-24 MED ORDER — TECHNETIUM TO 99M ALBUMIN AGGREGATED
6.0000 | Freq: Once | INTRAVENOUS | Status: AC | PRN
  Administered 2012-01-24: 6 via INTRAVENOUS

## 2012-01-30 ENCOUNTER — Encounter: Payer: Self-pay | Admitting: Orthopedic Surgery

## 2012-01-30 ENCOUNTER — Ambulatory Visit (INDEPENDENT_AMBULATORY_CARE_PROVIDER_SITE_OTHER): Payer: Medicare Other | Admitting: Orthopedic Surgery

## 2012-01-30 VITALS — BP 99/50 | Ht 71.5 in | Wt 187.0 lb

## 2012-01-30 DIAGNOSIS — M5126 Other intervertebral disc displacement, lumbar region: Secondary | ICD-10-CM

## 2012-01-30 NOTE — Patient Instructions (Addendum)
Schedule for MRI L-spine   Then you will have epidural injection (you will have to stop the plavix when you have the injection)   Epidural Steroid Injection An epidural steroid injection is given to relieve pain in the neck, back, or legs. This procedure involves injecting a steroid and numbing medicine (local anesthetic) into the epidural space. The epidural space is the space between the outer covering of the spinal cord and the vertebra. The epidural steroid injection helps in reducing the pain that is caused by the irritation or swelling of the nerve root. However, it does not cure the underlying problem. The injection may be given for the following conditions:  Changes in the soft, gel-like cushion between two vertebrae (disk) due to wear and tear.   A reduction in the space within the spinal canal.   Slipped or herniated disk.   Low back (lumbar) sprain.   Sciatica. This is shooting pain that radiates down the buttocks and the back of the leg due to compression of the nerve.   Traumatic compression fracture of the vertebra.   Pain that develops after a surgery of the spine.   Pain that arises after an attack of viral infection affecting the nerves (shingles).  LET YOUR CAREGIVER KNOW ABOUT:    Allergies to food or medicine.   Medicines taken, including vitamins, herbs, eyedrops, over-the-counter medicines, and creams.   Use of steroids (by mouth or creams).   Previous problems with anesthetics or numbing medicines.   History of bleeding problems or blood clots.   Previous surgery.   Other health problems, including diabetes and kidney problems.   Possibility of pregnancy, if this applies.      RISKS AND COMPLICATIONS The complications due to the needle insertion are:  Headache.   Bleeding.   Infection.   Allergic reaction to the medicines.   Damage to the nerves.  The complications due to the steroid are:  Weight gain.   Hot flashes.   Mood swings.     Lack of sleep.   Increase in blood sugar levels, especially if you are diabetic.   Retention of water.  The response to this procedure depends on the underlying cause of the pain and its duration. Patients who have long-term (chronic) pain are less likely to benefit from epidural steroids than are patients whose pain comes on strong and suddenly. BEFORE THE PROCEDURE    The caregiver may ask about your symptoms, do a detailed exam, and advise some tests. These tests may include imaging studies. Your caregiver may review the results of your tests and discuss the procedure with you.   Ask your caregiver about changing or stopping your regular medicines. You may be advised to stop taking blood-thinning medicines a few days before the procedure.   You may also be given medicines to reduce your anxiety.  PROCEDURE   You will remain awake during the whole procedure. Although, you may receive medicine to make you sleepy. You will be asked to lie on your stomach. The site of the injection is cleansed. Then, the injection site is numbed using a small amount of medicine that numbs the area (local anesthetic). A hollow needle is directed through your skin into the epidural space with the help of an X-ray. The X-ray helps to ensure that the steroid is delivered closest to the affected nerve. You may have some minimal discomfort at this time. Once the needle is in the right position, the local anesthetic and the steroid are  injected into the epidural space. The needle is then removed. The skin is cleaned and a bandage is applied. The entire procedure takes only a few minutes, although repeated injections may be required (up to 3 to 4 injections over several weeks).   AFTER THE PROCEDURE    You may be monitored for a short time before you go home.   You may feel weakness or numbness in your arm or leg, which disappears within 1 to 2 hours.   Someone must drive you home or accompany you home if you are  taking a taxi.   You may be allowed to eat, drink, and take your regular medicine.   Your pain may improve or worsen right after the procedure.   You may feel the beneficial effect of the steroid a few days later.   You may have soreness at the site of the injection.   If you have only partial relief of the pain, the injection may be repeated once or even twice within 4 to 8 weeks of the initial injection.  Document Released: 04/10/2007 Document Revised: 03/26/2011 Document Reviewed: 05/13/2008 [facility] Patient Information 2013 [place], [place].

## 2012-01-30 NOTE — Progress Notes (Signed)
Patient ID: [name]., male   DOB: Nov 02, 1921, 79 y.o.   MRN: [id number] Chief Complaint  Patient presents with  . Follow-up    4 week recheck radicular pain after medical management    The patient has had a course of steroids, as well as Neurontin, and continues to have LEFT lower extremity radicular pain.  He says he is slightly better, but still has intense night pain.  I explained to him that is not a surgical candidate, however, we can try a course of epidural steroid injections after he has an MRI.  MRI will be scheduled and then the patient will be scheduled for epidural steroid injections. Based on the MRI. He should continue his gabapentin, and hydrocodone

## 2012-02-05 ENCOUNTER — Telehealth: Payer: Self-pay | Admitting: Orthopedic Surgery

## 2012-02-05 ENCOUNTER — Telehealth: Payer: Self-pay | Admitting: *Deleted

## 2012-02-05 ENCOUNTER — Other Ambulatory Visit: Payer: Self-pay | Admitting: *Deleted

## 2012-02-05 NOTE — Telephone Encounter (Signed)
Appointment scheduled for MRI at [facility] for 02/06/12 at 11:00AM. Patient informed of appointment and told to be there at 10:45AM. Patient has Medicare so no precert required and will follow up in office for results.

## 2012-02-05 NOTE — Telephone Encounter (Signed)
Office notes faxed to Dr. Alonza Smoker office, referring primary care provider, to # [phone].

## 2012-02-06 ENCOUNTER — Ambulatory Visit (HOSPITAL_COMMUNITY)
Admission: RE | Admit: 2012-02-06 | Discharge: 2012-02-06 | Disposition: A | Discharge status: Home / Self Care | Payer: Medicare Other | Source: Ambulatory Visit | Attending: Orthopedic Surgery | Admitting: Orthopedic Surgery

## 2012-02-06 DIAGNOSIS — M5137 Other intervertebral disc degeneration, lumbosacral region: Secondary | ICD-10-CM | POA: Insufficient documentation

## 2012-02-06 DIAGNOSIS — M545 Low back pain, unspecified: Secondary | ICD-10-CM | POA: Insufficient documentation

## 2012-02-06 DIAGNOSIS — M51379 Other intervertebral disc degeneration, lumbosacral region without mention of lumbar back pain or lower extremity pain: Secondary | ICD-10-CM | POA: Insufficient documentation

## 2012-02-06 DIAGNOSIS — M25559 Pain in unspecified hip: Secondary | ICD-10-CM | POA: Insufficient documentation

## 2012-02-06 DIAGNOSIS — M5126 Other intervertebral disc displacement, lumbar region: Secondary | ICD-10-CM | POA: Insufficient documentation

## 2012-02-13 ENCOUNTER — Encounter: Payer: Self-pay | Admitting: Neurosurgery

## 2012-02-14 ENCOUNTER — Encounter (INDEPENDENT_AMBULATORY_CARE_PROVIDER_SITE_OTHER): Payer: Medicare Other | Admitting: *Deleted

## 2012-02-14 ENCOUNTER — Encounter: Payer: Self-pay | Admitting: Neurosurgery

## 2012-02-14 ENCOUNTER — Ambulatory Visit (INDEPENDENT_AMBULATORY_CARE_PROVIDER_SITE_OTHER): Payer: Medicare Other | Admitting: Neurosurgery

## 2012-02-14 VITALS — BP 134/78 | HR 64 | Resp 16 | Ht 72.0 in | Wt 187.0 lb

## 2012-02-14 DIAGNOSIS — I714 Abdominal aortic aneurysm, without rupture: Secondary | ICD-10-CM

## 2012-02-14 NOTE — Progress Notes (Signed)
[facility] Office Note  CC: AAA surveillance Referring Physician: Early  History of Present Illness:  79 year old male patient of Dr. Arbie Cookey followed for known AAA. The patient denies any unusual abdominal or back pain however he does have chronic lumbar pain. The patient denies any new medical diagnoses or recent surgery.  Past Medical History  Diagnosis Date  . Gout   . Stroke 1993, 1995    chronic balance issues  . Hyperlipidemia   . Emphysema   . Hypertension   . CAD (coronary artery disease)     stent 2006  . Seizure disorder   . GERD (gastroesophageal reflux disease)   . Cancer   . Asthma   . Shortness of breath Jan. 1, 2014    Syncope    ROS: [x]  Positive   [ ]  Denies    General: [ ]  Weight loss, [ ]  Fever, [ ]  chills Neurologic: [ ]  Dizziness, [ ]  Blackouts, [ ]  Seizure [ ]  Stroke, [ ]  "Mini stroke", [ ]  Slurred speech, [ ]  Temporary blindness; [ ]  weakness in arms or legs, [ ]  Hoarseness Cardiac: [ ]  Chest pain/pressure, [ ]  Shortness of breath at rest [ ]  Shortness of breath with exertion, [ ]  Atrial fibrillation or irregular heartbeat Vascular: [ ]  Pain in legs with walking, [ ]  Pain in legs at rest, [ ]  Pain in legs at night,  [ ]  Non-healing ulcer, [ ]  Blood clot in vein/DVT,   Pulmonary: [ ]  Home oxygen, [ ]  Productive cough, [ ]  Coughing up blood, [ ]  Asthma,  [ ]  Wheezing Musculoskeletal:  [ ]  Arthritis, [ ]  Low back pain, [ ]  Joint pain Hematologic: [ ]  Easy Bruising, [ ]  Anemia; [ ]  Hepatitis Gastrointestinal: [ ]  Blood in stool, [ ]  Gastroesophageal Reflux/heartburn, [ ]  Trouble swallowing Urinary: [ ]  chronic Kidney disease, [ ]  on HD - [ ]  MWF or [ ]  TTHS, [ ]  Burning with urination, [ ]  Difficulty urinating Skin: [ ]  Rashes, [ ]  Wounds Psychological: [ ]  Anxiety, [ ]  Depression   Social History History  Substance Use Topics  . Smoking status: Former Smoker -- 0.3 packs/day for 50 years    Types: Cigarettes     Quit date: 01/15/1990  . Smokeless tobacco: Never Used  . Alcohol Use: No    Family History Family History  Problem Relation Age of Onset  . Emphysema Brother   . Heart disease Sister   . Cancer Brother     lung  . Cancer Brother     throat  . Rheum arthritis Mother   . Rheum arthritis Father   . Colon cancer Neg Hx   . Diabetes      Allergies  Allergen Reactions  . Penicillins     itching    Current Outpatient Prescriptions  Medication Sig Dispense Refill  . acetaminophen (TYLENOL) 500 MG tablet Take 500 mg by mouth every 6 (six) hours as needed. Pain.      Marland Kitchen albuterol (PROAIR HFA) 108 (90 BASE) MCG/ACT inhaler Inhale 1-2 puffs into the lungs every 4 (four) hours as needed. Shortness of breath.      . allopurinol (ZYLOPRIM) 300 MG tablet Take 300 mg by mouth daily.        Marland Kitchen amLODipine (NORVASC) 5 MG tablet Take 2.5 mg by mouth daily.       Marland Kitchen aspirin 325 MG tablet Take 325 mg by mouth daily.        Marland Kitchen  bisacodyl (DULCOLAX) 5 MG EC tablet Take 5 mg by mouth daily as needed. Laxative.      . clopidogrel (PLAVIX) 75 MG tablet Take 75 mg by mouth daily.       Marland Kitchen donepezil (ARICEPT) 10 MG tablet Take 10 mg by mouth at bedtime.       . famotidine (PEPCID) 20 MG tablet Take 1 tablet (20 mg total) by mouth 2 (two) times daily.  60 tablet  6  . isosorbide mononitrate (IMDUR) 60 MG 24 hr tablet Take 1 tablet (60 mg total) by mouth daily.  30 tablet  11  . losartan (COZAAR) 100 MG tablet Take 100 mg by mouth daily.      . metoprolol tartrate (LOPRESSOR) 25 MG tablet Take 25 mg by mouth daily.        . nitroGLYCERIN (NITROSTAT) 0.4 MG SL tablet Place 1 tablet (0.4 mg total) under the tongue every 5 (five) minutes as needed for chest pain. 1 tablet under tongue at onset of chest pain;you may repeat every 5 minutes for up to 3 doses.  30 tablet  6  . Oxcarbazepine (TRILEPTAL) 300 MG tablet Take 300-450 mg by mouth 2 (two) times daily. 300 in the AM; 450 in the PM.      . oxybutynin  (DITROPAN) 5 MG tablet Take 5 mg by mouth 2 (two) times daily.       . pravastatin (PRAVACHOL) 40 MG tablet Take 40 mg by mouth daily. Daily at bedtime      . psyllium (METAMUCIL SMOOTH TEXTURE) 28 % packet Take 1 packet by mouth at bedtime.      . sennosides-docusate sodium (SENOKOT-S) 8.6-50 MG tablet Take 1 tablet by mouth 2 (two) times daily.      . Tamsulosin HCl (FLOMAX) 0.4 MG CAPS Take 0.4 mg by mouth daily.       Marland Kitchen tiotropium (SPIRIVA HANDIHALER) 18 MCG inhalation capsule Place 1 capsule (18 mcg total) into inhaler and inhale daily.  30 capsule  11  . VITAMIN B1-B12 IM Inject into the muscle. Once a month      . gabapentin (NEURONTIN) 100 MG capsule Take 1 capsule (100 mg total) by mouth 3 (three) times daily.  42 capsule  1  . HYDROcodone-acetaminophen (NORCO) 5-325 MG per tablet Take 1 tablet by mouth every 6 (six) hours as needed for pain.  56 tablet  1    Physical Examination  Filed Vitals:   02/14/12 1007  BP: 134/78  Pulse: 64  Resp: 16    Body mass index is 25.36 kg/(m^2).  General:  WDWN in NAD Gait: Normal HEENT: WNL Eyes: Pupils equal Pulmonary: normal non-labored breathing , without Rales, rhonchi,  wheezing Cardiac: RRR, without  Murmurs, rubs or gallops; Abdomen: soft, NT, no masses Skin: no rashes, ulcers noted  Vascular Exam Pulses: 3+ radial pulses bilaterally, palpable femoral pulses bilaterally, there is a pulsatile nontender abdominal mass palpated Carotid bruits: Carotid pulses to auscultation Extremities without ischemic changes, no Gangrene , no cellulitis; no open wounds;  Musculoskeletal: no muscle wasting or atrophy   Neurologic: A&O X 3; Appropriate Affect ; SENSATION: normal; MOTOR FUNCTION:  moving all extremities equally. Speech is fluent/normal  Non-Invasive Vascular Imaging AAA duplex shows a maximum diameter of 4.59 AP by 4.68 transverse which is slightly diminished from January 2013 when he was 5.01 x 4.19  ASSESSMENT/PLAN:  Asymptomatic patient will followup in 6 months with repeat AAA duplex. The patient's questions were encouraged and answered, he  is in agreement with this plan.  Lauree Chandler ANP   Clinic MD: Early

## 2012-02-14 NOTE — Addendum Note (Signed)
Addended by: Sharee Pimple on: 02/14/2012 02:55 PM   Modules accepted: Orders

## 2012-02-18 ENCOUNTER — Telehealth: Payer: Self-pay | Admitting: Orthopedic Surgery

## 2012-02-18 NOTE — Telephone Encounter (Signed)
No note, error

## 2012-03-03 ENCOUNTER — Telehealth: Payer: Self-pay | Admitting: Orthopedic Surgery

## 2012-03-03 ENCOUNTER — Telehealth: Payer: Self-pay | Admitting: *Deleted

## 2012-03-03 ENCOUNTER — Other Ambulatory Visit: Payer: Self-pay | Admitting: *Deleted

## 2012-03-03 DIAGNOSIS — M5126 Other intervertebral disc displacement, lumbar region: Secondary | ICD-10-CM

## 2012-03-03 NOTE — Telephone Encounter (Signed)
Called patient to let him know that Dr. Romeo Apple was sending him to Dr. Nickola Major for ESI injections.Faxed referral and office notes.  Awaiting appointment.

## 2012-03-03 NOTE — Telephone Encounter (Signed)
Patient does not want to go for shots because he is feeling better but would like you to call him with his MRI results.

## 2012-03-03 NOTE — Telephone Encounter (Signed)
   He called back and wanted to know why it took so long for him to get his results of his MRI.  As I discussed with him on his visit here if he had a positive study we would refer him for an epidural injection so we went ahead and did that on the positive study and he did not understand the instructions although they were written and discussed

## 2012-03-03 NOTE — Telephone Encounter (Signed)
Called 2-17

## 2012-03-03 NOTE — Telephone Encounter (Signed)
[phone]  Not answering the phone   Attempted phone call

## 2012-03-06 ENCOUNTER — Ambulatory Visit (HOSPITAL_COMMUNITY)
Admission: RE | Admit: 2012-03-06 | Discharge: 2012-03-06 | Disposition: A | Discharge status: Home / Self Care | Payer: Medicare Other | Source: Ambulatory Visit | Attending: Internal Medicine | Admitting: Internal Medicine

## 2012-03-06 ENCOUNTER — Other Ambulatory Visit (HOSPITAL_COMMUNITY): Payer: Self-pay | Admitting: Internal Medicine

## 2012-03-06 ENCOUNTER — Other Ambulatory Visit: Payer: Self-pay | Admitting: Critical Care Medicine

## 2012-03-06 DIAGNOSIS — R05 Cough: Secondary | ICD-10-CM

## 2012-03-06 DIAGNOSIS — R059 Cough, unspecified: Secondary | ICD-10-CM | POA: Insufficient documentation

## 2012-03-06 DIAGNOSIS — J438 Other emphysema: Secondary | ICD-10-CM | POA: Insufficient documentation

## 2012-03-06 MED ORDER — FAMOTIDINE 20 MG PO TABS: 20.0000 mg | ORAL_TABLET | Freq: Two times a day (BID) | ORAL | Status: DC

## 2012-03-06 NOTE — Telephone Encounter (Signed)
Received faxed refill request from [facility] Pepcid 20mg  BID Last ov 12.20.13 w/ PW Refills sent

## 2012-03-06 NOTE — Progress Notes (Signed)
Heron Nay,  daughter of Erice Ahles, called and left message that [name] did not want ESI injections at this time.

## 2012-03-19 ENCOUNTER — Telehealth: Payer: Self-pay | Admitting: Orthopedic Surgery

## 2012-03-19 NOTE — Telephone Encounter (Signed)
Received call from Sue Lush, Dr. Ines Bloomer [facility]'s office, ph [phone], relays that patient has been scheduled in their office for an appointment: 05/19/13, 8:30AM.  States patient is aware of the appointment.  She requests insurance information and demographic sheet.  I have faxed this information to [name]'s attention as requested to [phone].  Also received (immediately after this call), a voice message from ? Male family member on behalf of patient, ph# [phone], stating that patient has decided to have epidural injection. Please call back to this phone #.

## 2012-03-28 ENCOUNTER — Other Ambulatory Visit: Payer: Self-pay | Admitting: *Deleted

## 2012-03-28 MED ORDER — NITROGLYCERIN 0.4 MG SL SUBL: 0.4000 mg | SUBLINGUAL_TABLET | SUBLINGUAL | Status: DC | PRN

## 2012-05-29 ENCOUNTER — Other Ambulatory Visit: Payer: Self-pay | Admitting: Cardiology

## 2012-07-02 ENCOUNTER — Ambulatory Visit (INDEPENDENT_AMBULATORY_CARE_PROVIDER_SITE_OTHER): Payer: Medicare Other | Admitting: Critical Care Medicine

## 2012-07-02 ENCOUNTER — Encounter: Payer: Self-pay | Admitting: Critical Care Medicine

## 2012-07-02 VITALS — BP 140/62 | HR 64 | Temp 97.4°F | Ht 72.0 in | Wt 194.0 lb

## 2012-07-02 DIAGNOSIS — J449 Chronic obstructive pulmonary disease, unspecified: Secondary | ICD-10-CM

## 2012-07-02 DIAGNOSIS — J439 Emphysema, unspecified: Secondary | ICD-10-CM

## 2012-07-02 DIAGNOSIS — J438 Other emphysema: Secondary | ICD-10-CM

## 2012-07-02 DIAGNOSIS — J4489 Other specified chronic obstructive pulmonary disease: Secondary | ICD-10-CM

## 2012-07-02 NOTE — Patient Instructions (Addendum)
An overnight sleep oximetry will be obtained, we will call results No change in spiriva Let Dr Ouida Sills know if you keep having these weakness spells Return 6 months

## 2012-07-02 NOTE — Assessment & Plan Note (Signed)
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 Improved on Spiriva daily Significant early a.m. hypersomnolence may suggest nocturnal hypoxemia Plan Obtain an overnight sleep oximetry Maintains Spiriva

## 2012-07-02 NOTE — Progress Notes (Signed)
Subjective:    Patient ID: James Butte., male    DOB: 1921-02-18, 79 y.o.   MRN: [id number]  HPI 07/02/2012 Chief Complaint  Patient presents with  . 6 month follow up    Waking up in the mornings with SOB and still feeling tired.  Also, has DOE during the day and cough.  Cough is prod mostly qhs with clear mucus.  No wheezing, chest tightness, or chest pain at this time.  Pt awakening in am and is very dyspneic. Is better after gets up and moves around.  On no oxygen at night  Notes chronic pain.  No real wheeze.      Past Medical History  Diagnosis Date  . Gout   . Stroke 1993, 1995    chronic balance issues  . Hyperlipidemia   . Emphysema   . Hypertension   . CAD (coronary artery disease)     stent 2006  . Seizure disorder   . GERD (gastroesophageal reflux disease)   . Cancer   . Asthma   . Shortness of breath Jan. 1, 2014    Syncope     Family History  Problem Relation Age of Onset  . Emphysema Brother   . Heart disease Sister   . Cancer Brother     lung  . Cancer Brother     throat  . Rheum arthritis Mother   . Rheum arthritis Father   . Colon cancer Neg Hx   . Diabetes       History   Social History  . Marital Status: Widowed    Spouse Name: N/A    Number of Children: 2  . Years of Education: N/A   Occupational History  . Retired Garment/textile technologist   Social History Main Topics  . Smoking status: Former Smoker -- 0.30 packs/day for 50 years    Types: Cigarettes    Quit date: 01/15/1990  . Smokeless tobacco: Never Used  . Alcohol Use: No  . Drug Use: No  . Sexually Active: Not on file   Other Topics Concern  . Not on file   Social History Narrative  . No narrative on file     Allergies  Allergen Reactions  . Penicillins     itching     Outpatient Prescriptions Prior to Visit  Medication Sig Dispense Refill  . acetaminophen (TYLENOL) 500 MG tablet Take 500 mg by mouth every 6 (six) hours as needed. Pain.      Marland Kitchen albuterol  (PROAIR HFA) 108 (90 BASE) MCG/ACT inhaler Inhale 1-2 puffs into the lungs every 4 (four) hours as needed. Shortness of breath.      . allopurinol (ZYLOPRIM) 300 MG tablet Take 300 mg by mouth daily.        Marland Kitchen amLODipine (NORVASC) 5 MG tablet Take 2.5 mg by mouth daily.       Marland Kitchen aspirin 325 MG tablet Take 325 mg by mouth daily.        . bisacodyl (DULCOLAX) 5 MG EC tablet Take 5 mg by mouth daily as needed. Laxative.      . clopidogrel (PLAVIX) 75 MG tablet Take 75 mg by mouth daily.       Marland Kitchen donepezil (ARICEPT) 10 MG tablet Take 10 mg by mouth at bedtime.       . famotidine (PEPCID) 20 MG tablet Take 1 tablet (20 mg total) by mouth 2 (two) times daily.  60 tablet  6  . gabapentin (  NEURONTIN) 100 MG capsule Take 1 capsule (100 mg total) by mouth 3 (three) times daily.  42 capsule  1  . isosorbide mononitrate (IMDUR) 60 MG 24 hr tablet TAKE 1 TABLET BY MOUTH ONCE A DAY.  30 tablet  5  . losartan (COZAAR) 100 MG tablet Take 100 mg by mouth daily.      . metoprolol tartrate (LOPRESSOR) 25 MG tablet Take 25 mg by mouth daily.        . nitroGLYCERIN (NITROSTAT) 0.4 MG SL tablet Place 1 tablet (0.4 mg total) under the tongue every 5 (five) minutes as needed for chest pain. 1 tablet under tongue at onset of chest pain;you may repeat every 5 minutes for up to 3 doses.  30 tablet  6  . Oxcarbazepine (TRILEPTAL) 300 MG tablet Take 300-450 mg by mouth 2 (two) times daily. 300 in the AM; 450 in the PM.      . oxybutynin (DITROPAN) 5 MG tablet Take 5 mg by mouth 2 (two) times daily.       . pravastatin (PRAVACHOL) 40 MG tablet Take 40 mg by mouth daily. Daily at bedtime      . Tamsulosin HCl (FLOMAX) 0.4 MG CAPS Take 0.4 mg by mouth daily.       Marland Kitchen tiotropium (SPIRIVA HANDIHALER) 18 MCG inhalation capsule Place 1 capsule (18 mcg total) into inhaler and inhale daily.  30 capsule  11  . VITAMIN B1-B12 IM Inject into the muscle. Once a month      . sennosides-docusate sodium (SENOKOT-S) 8.6-50 MG tablet Take 1 tablet  by mouth 2 (two) times daily.      Marland Kitchen HYDROcodone-acetaminophen (NORCO) 5-325 MG per tablet Take 1 tablet by mouth every 6 (six) hours as needed for pain.  56 tablet  1  . psyllium (METAMUCIL SMOOTH TEXTURE) 28 % packet Take 1 packet by mouth at bedtime.       No facility-administered medications prior to visit.       Review of Systems  11 point review of systems was reviewed and is unremarkable except for history present    Objective:   Physical Exam BP 140/62  Pulse 64  Temp(Src) 97.4 F (36.3 C) (Oral)  Ht 6' (1.829 m)  Wt 194 lb (87.998 kg)  BMI 26.31 kg/m2  SpO2 94%  Gen: Pleasant, elderly, in no distress,  normal affect  ENT: No lesions,  mouth clear,  oropharynx clear, no postnasal drip  Neck: No JVD, no TMG, no carotid bruits  Lungs: No use of accessory muscles, no dullness to percussion, distant BS  Cardiovascular: RRR, heart sounds normal, no murmur or gallops, no peripheral edema  Abdomen: soft and NT, no HSM,  BS normal  Musculoskeletal: No deformities, no cyanosis or clubbing  Neuro: alert, non focal  Skin: Warm, no lesions or rashes             Assessment & Plan:  C O P D gold stage A Gold stage a COPD with moderate peripheral airflow obstruction but preserved DLCO. Improved on Spiriva daily Significant early a.m. hypersomnolence may suggest nocturnal hypoxemia Plan Obtain an overnight sleep oximetry Maintains Spiriva   Outpatient Encounter Prescriptions as of 07/02/2012  Medication Sig Dispense Refill  . acetaminophen (TYLENOL) 500 MG tablet Take 500 mg by mouth every 6 (six) hours as needed. Pain.      Marland Kitchen albuterol (PROAIR HFA) 108 (90 BASE) MCG/ACT inhaler Inhale 1-2 puffs into the lungs every 4 (four) hours as needed. Shortness  of breath.      . allopurinol (ZYLOPRIM) 300 MG tablet Take 300 mg by mouth daily.        Marland Kitchen amLODipine (NORVASC) 5 MG tablet Take 2.5 mg by mouth daily.       Marland Kitchen aspirin 325 MG tablet Take 325 mg by mouth daily.         . bisacodyl (DULCOLAX) 5 MG EC tablet Take 5 mg by mouth daily as needed. Laxative.      . clopidogrel (PLAVIX) 75 MG tablet Take 75 mg by mouth daily.       Marland Kitchen donepezil (ARICEPT) 10 MG tablet Take 10 mg by mouth at bedtime.       . famotidine (PEPCID) 20 MG tablet Take 1 tablet (20 mg total) by mouth 2 (two) times daily.  60 tablet  6  . gabapentin (NEURONTIN) 100 MG capsule Take 1 capsule (100 mg total) by mouth 3 (three) times daily.  42 capsule  1  . isosorbide mononitrate (IMDUR) 60 MG 24 hr tablet TAKE 1 TABLET BY MOUTH ONCE A DAY.  30 tablet  5  . losartan (COZAAR) 100 MG tablet Take 100 mg by mouth daily.      . metoprolol tartrate (LOPRESSOR) 25 MG tablet Take 25 mg by mouth daily.        . nitroGLYCERIN (NITROSTAT) 0.4 MG SL tablet Place 1 tablet (0.4 mg total) under the tongue every 5 (five) minutes as needed for chest pain. 1 tablet under tongue at onset of chest pain;you may repeat every 5 minutes for up to 3 doses.  30 tablet  6  . Oxcarbazepine (TRILEPTAL) 300 MG tablet Take 300-450 mg by mouth 2 (two) times daily. 300 in the AM; 450 in the PM.      . oxybutynin (DITROPAN) 5 MG tablet Take 5 mg by mouth 2 (two) times daily.       . pravastatin (PRAVACHOL) 40 MG tablet Take 40 mg by mouth daily. Daily at bedtime      . Tamsulosin HCl (FLOMAX) 0.4 MG CAPS Take 0.4 mg by mouth daily.       Marland Kitchen tiotropium (SPIRIVA HANDIHALER) 18 MCG inhalation capsule Place 1 capsule (18 mcg total) into inhaler and inhale daily.  30 capsule  11  . VITAMIN B1-B12 IM Inject into the muscle. Once a month      . sennosides-docusate sodium (SENOKOT-S) 8.6-50 MG tablet Take 1 tablet by mouth 2 (two) times daily.      . [DISCONTINUED] HYDROcodone-acetaminophen (NORCO) 5-325 MG per tablet Take 1 tablet by mouth every 6 (six) hours as needed for pain.  56 tablet  1  . [DISCONTINUED] psyllium (METAMUCIL SMOOTH TEXTURE) 28 % packet Take 1 packet by mouth at bedtime.       No facility-administered encounter  medications on file as of 07/02/2012.

## 2012-07-14 ENCOUNTER — Telehealth: Payer: Self-pay | Admitting: Critical Care Medicine

## 2012-07-14 DIAGNOSIS — J449 Chronic obstructive pulmonary disease, unspecified: Secondary | ICD-10-CM

## 2012-07-14 DIAGNOSIS — R0902 Hypoxemia: Secondary | ICD-10-CM

## 2012-07-14 NOTE — Telephone Encounter (Signed)
Order faxed to [facility] for 02 set up Tobe Sos

## 2012-07-14 NOTE — Telephone Encounter (Signed)
Call the pt and tell him ONO positive for desaturation and he would benefit from home oxygen 2Liters QHS only  Order sent to [facility]

## 2012-07-15 NOTE — Telephone Encounter (Signed)
Pt returned call Advised of postitive ONO results/recs as stated by PW Pt stated that [facility] delivered his O2 yesterday evening and he did wear it last night Pt aware to only wear O2 at night and with naps No questions at this time Will sign off

## 2012-07-15 NOTE — Telephone Encounter (Signed)
lmomtcb for pt 

## 2012-07-16 ENCOUNTER — Emergency Department (HOSPITAL_COMMUNITY): Payer: Medicare Other

## 2012-07-16 ENCOUNTER — Observation Stay (HOSPITAL_COMMUNITY): Payer: Medicare Other

## 2012-07-16 ENCOUNTER — Encounter (HOSPITAL_COMMUNITY): Payer: Self-pay

## 2012-07-16 ENCOUNTER — Inpatient Hospital Stay (HOSPITAL_COMMUNITY)
Admission: EM | Admit: 2012-07-16 | Discharge: 2012-07-18 | DRG: 066 | Disposition: A | Discharge status: Home / Self Care | Payer: Medicare Other | Attending: Internal Medicine | Admitting: Internal Medicine

## 2012-07-16 DIAGNOSIS — N189 Chronic kidney disease, unspecified: Secondary | ICD-10-CM | POA: Diagnosis present

## 2012-07-16 DIAGNOSIS — I2510 Atherosclerotic heart disease of native coronary artery without angina pectoris: Secondary | ICD-10-CM | POA: Diagnosis present

## 2012-07-16 DIAGNOSIS — J45909 Unspecified asthma, uncomplicated: Secondary | ICD-10-CM | POA: Diagnosis present

## 2012-07-16 DIAGNOSIS — E785 Hyperlipidemia, unspecified: Secondary | ICD-10-CM | POA: Diagnosis present

## 2012-07-16 DIAGNOSIS — R600 Localized edema: Secondary | ICD-10-CM

## 2012-07-16 DIAGNOSIS — G40909 Epilepsy, unspecified, not intractable, without status epilepticus: Secondary | ICD-10-CM | POA: Diagnosis present

## 2012-07-16 DIAGNOSIS — Z87891 Personal history of nicotine dependence: Secondary | ICD-10-CM

## 2012-07-16 DIAGNOSIS — J438 Other emphysema: Secondary | ICD-10-CM

## 2012-07-16 DIAGNOSIS — R29810 Facial weakness: Secondary | ICD-10-CM | POA: Diagnosis present

## 2012-07-16 DIAGNOSIS — R269 Unspecified abnormalities of gait and mobility: Secondary | ICD-10-CM

## 2012-07-16 DIAGNOSIS — R609 Edema, unspecified: Secondary | ICD-10-CM

## 2012-07-16 DIAGNOSIS — Z8673 Personal history of transient ischemic attack (TIA), and cerebral infarction without residual deficits: Secondary | ICD-10-CM

## 2012-07-16 DIAGNOSIS — G459 Transient cerebral ischemic attack, unspecified: Secondary | ICD-10-CM

## 2012-07-16 DIAGNOSIS — J449 Chronic obstructive pulmonary disease, unspecified: Secondary | ICD-10-CM

## 2012-07-16 DIAGNOSIS — J4489 Other specified chronic obstructive pulmonary disease: Secondary | ICD-10-CM

## 2012-07-16 DIAGNOSIS — K219 Gastro-esophageal reflux disease without esophagitis: Secondary | ICD-10-CM

## 2012-07-16 DIAGNOSIS — F0390 Unspecified dementia without behavioral disturbance: Secondary | ICD-10-CM | POA: Diagnosis present

## 2012-07-16 DIAGNOSIS — I10 Essential (primary) hypertension: Secondary | ICD-10-CM

## 2012-07-16 DIAGNOSIS — M109 Gout, unspecified: Secondary | ICD-10-CM | POA: Diagnosis present

## 2012-07-16 DIAGNOSIS — I6350 Cerebral infarction due to unspecified occlusion or stenosis of unspecified cerebral artery: Principal | ICD-10-CM

## 2012-07-16 DIAGNOSIS — I129 Hypertensive chronic kidney disease with stage 1 through stage 4 chronic kidney disease, or unspecified chronic kidney disease: Secondary | ICD-10-CM | POA: Diagnosis present

## 2012-07-16 DIAGNOSIS — E119 Type 2 diabetes mellitus without complications: Secondary | ICD-10-CM | POA: Diagnosis present

## 2012-07-16 DIAGNOSIS — I6381 Other cerebral infarction due to occlusion or stenosis of small artery: Secondary | ICD-10-CM | POA: Diagnosis present

## 2012-07-16 LAB — TROPONIN I: Troponin I: <0.3 ng/mL (ref <0.30)

## 2012-07-16 LAB — CBC WITH DIFFERENTIAL/PLATELET
Basophils Absolute: 0 10*3/uL (ref 0.0–0.1)
Basophils Relative: 0 % (ref 0–1)
Eosinophils Absolute: 0.3 10*3/uL (ref 0.0–0.7)
Eosinophils Relative: 5 % (ref 0–5)
HCT: 36.8 % — ABNORMAL LOW (ref 39.0–52.0)
MCH: 33.8 pg (ref 26.0–34.0)
MCHC: 34.2 g/dL (ref 30.0–36.0)
MCV: 98.7 fL (ref 78.0–100.0)
Monocytes Absolute: 0.7 10*3/uL (ref 0.1–1.0)
Monocytes Relative: 11 % (ref 3–12)
RDW: 14.3 % (ref 11.5–15.5)

## 2012-07-16 LAB — COMPREHENSIVE METABOLIC PANEL
CO2: 30 mEq/L (ref 19–32)
Calcium: 8.8 mg/dL (ref 8.4–10.5)
Chloride: 103 mEq/L (ref 96–112)
Creatinine, Ser: 1.73 mg/dL — ABNORMAL HIGH (ref 0.50–1.35)
GFR calc Af Amer: 39 mL/min — ABNORMAL LOW (ref >90)
GFR calc non Af Amer: 33 mL/min — ABNORMAL LOW (ref >90)
Glucose, Bld: 145 mg/dL — ABNORMAL HIGH (ref 70–99)
Total Bilirubin: 0.3 mg/dL (ref 0.3–1.2)

## 2012-07-16 LAB — GLUCOSE, CAPILLARY: Glucose-Capillary: 95 mg/dL (ref 70–99)

## 2012-07-16 MED ORDER — OXCARBAZEPINE 300 MG PO TABS
300.0000 mg | ORAL_TABLET | Freq: Every day | ORAL | Status: DC
  Administered 2012-07-17 – 2012-07-18 (×2): 300 mg via ORAL
  Filled 2012-07-16 (×5): qty 1

## 2012-07-16 MED ORDER — CLOPIDOGREL BISULFATE 75 MG PO TABS
75.0000 mg | ORAL_TABLET | Freq: Every day | ORAL | Status: DC
  Administered 2012-07-17 – 2012-07-18 (×2): 75 mg via ORAL
  Filled 2012-07-16 (×2): qty 1

## 2012-07-16 MED ORDER — OXCARBAZEPINE 300 MG PO TABS
ORAL_TABLET | ORAL | Status: AC
  Filled 2012-07-16: qty 2

## 2012-07-16 MED ORDER — OXYBUTYNIN CHLORIDE 5 MG PO TABS
5.0000 mg | ORAL_TABLET | Freq: Two times a day (BID) | ORAL | Status: DC
  Administered 2012-07-16 – 2012-07-18 (×4): 5 mg via ORAL
  Filled 2012-07-16 (×4): qty 1

## 2012-07-16 MED ORDER — ACETAMINOPHEN 325 MG PO TABS: 650.0000 mg | ORAL_TABLET | ORAL | Status: DC | PRN

## 2012-07-16 MED ORDER — ALBUTEROL SULFATE HFA 108 (90 BASE) MCG/ACT IN AERS: 1.0000 | INHALATION_SPRAY | RESPIRATORY_TRACT | Status: DC | PRN

## 2012-07-16 MED ORDER — OXCARBAZEPINE 300 MG PO TABS
450.0000 mg | ORAL_TABLET | Freq: Every day | ORAL | Status: DC
  Administered 2012-07-16: 450 mg via ORAL
  Filled 2012-07-16 (×2): qty 1

## 2012-07-16 MED ORDER — NITROGLYCERIN 0.4 MG SL SUBL: 0.4000 mg | SUBLINGUAL_TABLET | SUBLINGUAL | Status: DC | PRN

## 2012-07-16 MED ORDER — ENOXAPARIN SODIUM 40 MG/0.4ML ~~LOC~~ SOLN
40.0000 mg | Freq: Every day | SUBCUTANEOUS | Status: DC
  Administered 2012-07-16 – 2012-07-17 (×2): 40 mg via SUBCUTANEOUS
  Filled 2012-07-16 (×2): qty 0.4

## 2012-07-16 MED ORDER — TAMSULOSIN HCL 0.4 MG PO CAPS
0.4000 mg | ORAL_CAPSULE | Freq: Every day | ORAL | Status: DC
  Administered 2012-07-16 – 2012-07-17 (×2): 0.4 mg via ORAL
  Filled 2012-07-16 (×2): qty 1

## 2012-07-16 MED ORDER — FUROSEMIDE 10 MG/ML IJ SOLN
20.0000 mg | Freq: Every day | INTRAMUSCULAR | Status: DC
  Administered 2012-07-16: 20 mg via INTRAVENOUS
  Filled 2012-07-16: qty 2

## 2012-07-16 MED ORDER — TIOTROPIUM BROMIDE MONOHYDRATE 18 MCG IN CAPS
18.0000 ug | ORAL_CAPSULE | Freq: Every day | RESPIRATORY_TRACT | Status: DC
Start: 2012-07-17 — End: 2012-07-18
  Administered 2012-07-17 – 2012-07-18 (×2): 18 ug via RESPIRATORY_TRACT
  Filled 2012-07-16: qty 5

## 2012-07-16 MED ORDER — ASPIRIN 325 MG PO TABS
325.0000 mg | ORAL_TABLET | Freq: Every morning | ORAL | Status: DC
  Administered 2012-07-17 – 2012-07-18 (×2): 325 mg via ORAL
  Filled 2012-07-16 (×2): qty 1

## 2012-07-16 MED ORDER — SIMVASTATIN 20 MG PO TABS
20.0000 mg | ORAL_TABLET | Freq: Every day | ORAL | Status: DC
  Administered 2012-07-16 – 2012-07-17 (×2): 20 mg via ORAL
  Filled 2012-07-16 (×2): qty 1

## 2012-07-16 MED ORDER — FAMOTIDINE 20 MG PO TABS
20.0000 mg | ORAL_TABLET | Freq: Two times a day (BID) | ORAL | Status: DC
  Administered 2012-07-16 – 2012-07-18 (×4): 20 mg via ORAL
  Filled 2012-07-16 (×4): qty 1

## 2012-07-16 MED ORDER — SENNOSIDES-DOCUSATE SODIUM 8.6-50 MG PO TABS
1.0000 | ORAL_TABLET | Freq: Two times a day (BID) | ORAL | Status: DC
  Administered 2012-07-16 – 2012-07-18 (×4): 1 via ORAL
  Filled 2012-07-16 (×4): qty 1

## 2012-07-16 MED ORDER — DONEPEZIL HCL 5 MG PO TABS
10.0000 mg | ORAL_TABLET | Freq: Every day | ORAL | Status: DC
  Administered 2012-07-16: 10 mg via ORAL
  Filled 2012-07-16: qty 2

## 2012-07-16 NOTE — ED Notes (Signed)
Pt states he ate lunch and then went out side to sit in a car. States he just kept falling around. Family states pt has had a stroke in the past and and has had several TIA'S.

## 2012-07-16 NOTE — ED Notes (Signed)
Denies pain, reports that pt has been having more shuffling while walking, reported that pt had some facial droop for awhile on Sunday, slurred speech per family today

## 2012-07-16 NOTE — ED Provider Notes (Signed)
History    This chart was scribed for James Roller, MD by Quintella Reichert, ED scribe.  This patient was seen in room [facility] and the patient's care was started at 3:10 PM.  CSN: [id number]  Arrival date & time 07/16/12  1443    Chief Complaint  Patient presents with  . Weakness    The history is provided by the patient and a relative. No language interpreter was used.    HPI Comments: [name] is a 79 y.o. male with h/o strokes brought in by family to the Emergency Department complaining of transient episodes of speech difficulties, facial droop and weakness that have grown progressively more frequent in the past week. Family members describe his episodes as intermittent short spells of difficulty walking, slurred speech and left-sided facial droop.  They also note an "ashen" color to his skin and blue discoloration to the lips and tongue during episodes.   Pt has some chronic left-sided weakness residual to a stroke 15 years ago, but family reports that his facial droop and slurred speech are new.  Pt has had 5 of these spells in the past 2 weeks per relatives' report.  One hour ago pt called his relative complaining of an episode in which he attempted to arise from a rocking chair and fell down multiple times, and he also developed weakness to the left hand.  When his relative arrived she verified that his left hand was weak and measured his BP at 120/70.  His symptoms have since resolved and presently he denies HA, visual changes, CP, abdominal pain, neck pain, back pain, or any other associated symptoms.  His family states his behavior is presently at baseline.  He does note that his right ankle hurts subsequent to falling down a step yesterday.        Past Medical History  Diagnosis Date  . Gout   . Stroke 1993, 1995    chronic balance issues  . Hyperlipidemia   . Emphysema   . Hypertension   . CAD (coronary artery disease)     stent 2006  . Seizure disorder   .  GERD (gastroesophageal reflux disease)   . Cancer   . Asthma   . Shortness of breath Jan. 1, 2014    Syncope   Past Surgical History  Procedure Laterality Date  . Cardiac catheterization  08/23/2004    bare metal stenting of the proximal circumflex  . Colonoscopy      X 2  . Colonoscopy  11/01/2011    Procedure: COLONOSCOPY;  Surgeon: Malissa Hippo, MD;  Location: [facility];  Service: Endoscopy;  Laterality: N/A;  100  . Eye surgery    . Nose surgery     Family History  Problem Relation Age of Onset  . Emphysema Brother   . Heart disease Sister   . Cancer Brother     lung  . Cancer Brother     throat  . Rheum arthritis Mother   . Rheum arthritis Father   . Colon cancer Neg Hx   . Diabetes     History  Substance Use Topics  . Smoking status: Former Smoker -- 0.30 packs/day for 50 years    Types: Cigarettes    Quit date: 01/15/1990  . Smokeless tobacco: Never Used  . Alcohol Use: No    Review of Systems A complete 10 system review of systems was obtained and all systems are negative except as noted in the HPI and  PMH.    Allergies  Penicillins  Home Medications   Current Outpatient Rx  Name  Route  Sig  Dispense  Refill  . acetaminophen (TYLENOL) 500 MG tablet   Oral   Take 500 mg by mouth every 6 (six) hours as needed. Pain.         Marland Kitchen albuterol (PROAIR HFA) 108 (90 BASE) MCG/ACT inhaler   Inhalation   Inhale 1-2 puffs into the lungs every 4 (four) hours as needed. Shortness of breath.         . allopurinol (ZYLOPRIM) 300 MG tablet   Oral   Take 300 mg by mouth every morning.          Marland Kitchen amLODipine (NORVASC) 5 MG tablet   Oral   Take 2.5 mg by mouth every morning.          Marland Kitchen aspirin 325 MG tablet   Oral   Take 325 mg by mouth every morning.          . clopidogrel (PLAVIX) 75 MG tablet   Oral   Take 75 mg by mouth every morning.          . donepezil (ARICEPT) 10 MG tablet   Oral   Take 10 mg by mouth at bedtime.          .  famotidine (PEPCID) 20 MG tablet   Oral   Take 1 tablet (20 mg total) by mouth 2 (two) times daily.   60 tablet   6   . isosorbide mononitrate (IMDUR) 60 MG 24 hr tablet   Oral   Take 60 mg by mouth every morning.         Marland Kitchen losartan (COZAAR) 100 MG tablet   Oral   Take 100 mg by mouth every morning.          . metoprolol tartrate (LOPRESSOR) 25 MG tablet   Oral   Take 25 mg by mouth every morning.          . nitroGLYCERIN (NITROSTAT) 0.4 MG SL tablet   Sublingual   Place 1 tablet (0.4 mg total) under the tongue every 5 (five) minutes as needed for chest pain. 1 tablet under tongue at onset of chest pain;you may repeat every 5 minutes for up to 3 doses.   30 tablet   6   . Oxcarbazepine (TRILEPTAL) 300 MG tablet   Oral   Take 300-450 mg by mouth 2 (two) times daily. 300 in the AM; 450 in the PM.         . oxybutynin (DITROPAN) 5 MG tablet   Oral   Take 5 mg by mouth 2 (two) times daily.          . pravastatin (PRAVACHOL) 40 MG tablet   Oral   Take 40 mg by mouth daily. Daily at bedtime         . sennosides-docusate sodium (SENOKOT-S) 8.6-50 MG tablet   Oral   Take 1 tablet by mouth 2 (two) times daily.         . Tamsulosin HCl (FLOMAX) 0.4 MG CAPS   Oral   Take 0.4 mg by mouth at bedtime.          Marland Kitchen tiotropium (SPIRIVA HANDIHALER) 18 MCG inhalation capsule   Inhalation   Place 1 capsule (18 mcg total) into inhaler and inhale daily.   30 capsule   11   . VITAMIN B1-B12 IM   Intramuscular   Inject into the muscle every  30 (thirty) days. Once a month          BP 120/47  Pulse 63  Temp(Src) 98.3 F (36.8 C) (Oral)  Resp 18  SpO2 92%  Physical Exam  Nursing note and vitals reviewed. Constitutional: He appears well-developed and well-nourished. No distress.  HENT:  Head: Normocephalic and atraumatic.  Mouth/Throat: Oropharynx is clear and moist. No oropharyngeal exudate.  Eyes: Conjunctivae and EOM are normal. Pupils are equal, round, and  reactive to light. Right eye exhibits no discharge. Left eye exhibits no discharge. No scleral icterus.  Neck: Normal range of motion. Neck supple. No JVD present. No thyromegaly present.  Cardiovascular: Normal rate, regular rhythm, normal heart sounds and intact distal pulses.  Exam reveals no gallop and no friction rub.   No murmur heard. Pulmonary/Chest: Effort normal and breath sounds normal. No respiratory distress. He has no wheezes. He has no rales. He exhibits no tenderness.  Abdominal: Soft. Bowel sounds are normal. He exhibits no distension and no mass. There is no tenderness.  Musculoskeletal: Normal range of motion. He exhibits edema and tenderness.  Bruising and swelling around right lateral malleolus with some tenderness to palpation over area Edema from high left ankle down  Lymphadenopathy:    He has no cervical adenopathy.  Neurological: He is alert. Coordination normal.  Speech clear, pupils equal round reactive to light, extraocular movements intact  Normal peripheral visual fields Cranial nerves III through XII normal including no facial droop Follows commands, moves all extremities x4, normal strength to bilateral upper and lower extremities at all major muscle groups including grip Sensation normal to light touch and pinprick, except for decreased sensation to pinprick at left upper extremity Coordination intact, no limb ataxia, finger-nose-finger normal Rapid alternating movements normal No pronator drift  Skin: Skin is warm and dry. No rash noted. No erythema.  Psychiatric: He has a normal mood and affect. His behavior is normal.     ED Course  Procedures (including critical care time)  DIAGNOSTIC STUDIES: Oxygen Saturation is 92% on room air, low by my interpretation.    COORDINATION OF CARE: 3:22 PM: Informed pt that symptoms are likely due to TIAs.  Discussed treatment plan which includes head CT and ankle x-ray.  Pt and family expressed understanding and  agreed to plan.    Labs Reviewed  CBC WITH DIFFERENTIAL - Abnormal; Notable for the following:    RBC 3.73 (*)    Hemoglobin 12.6 (*)    HCT 36.8 (*)    Platelets 118 (*)    All other components within normal limits  COMPREHENSIVE METABOLIC PANEL - Abnormal; Notable for the following:    Glucose, Bld 145 (*)    Creatinine, Ser 1.73 (*)    Total Protein 5.9 (*)    Albumin 3.3 (*)    GFR calc non Af Amer 33 (*)    GFR calc Af Amer 39 (*)    All other components within normal limits  TROPONIN I  PROTIME-INR  APTT    Dg Ankle Complete Right  07/16/2012   *RADIOLOGY REPORT*  Clinical Data: Right ankle pain and swelling status post fall  RIGHT ANKLE - COMPLETE 3+ VIEW  Comparison: None.  Findings: Cortical irregularity at the distal aspect of the fibula is likely secondary to remote trauma or chronic degenerative changes.  No acute fracture or dislocation.  The ankle mortise is approximated.  No osteochondral defect.  There is no joint effusion.  Small posterior and plantar calcaneal spurs are  noted. There is mild diffuse soft tissue swelling about the ankle.  Osseous mineralization is normal.  Prominent atherosclerotic calcifications are noted within the lower leg.  IMPRESSION: 1.  No acute fracture or dislocation. 2.  Diffuse soft tissue swelling.  3. Posterior and plantar calcaneal spurs.   Original Report Authenticated By: Rise Mu, M.D.   Ct Head Wo Contrast  07/16/2012   *RADIOLOGY REPORT*  Clinical Data: Speech difficulties.  Facial droop and weakness.  CT HEAD WITHOUT CONTRAST  Technique:  Contiguous axial images were obtained from the base of the skull through the vertex without contrast.  Comparison: CT head without contrast 01/16/2012.  Findings: Remote lacunar infarcts are present in the basal ganglia bilaterally.  Moderate generalized atrophy white matter disease is present bilaterally.  No acute cortical infarct, hemorrhage, mass lesion is present.  The ventricles are  proportionate to the degree of atrophy.  No significant extra-axial fluid collection is present.  The paranasal sinuses and mastoid air cells are clear. Atherosclerotic calcifications are present within the cavernous carotid arteries bilaterally.  IMPRESSION:  1.  No acute intracranial abnormality or significant interval change. 2.  Multiple lacunar infarcts in the basal ganglia bilaterally are stable and remote. 3.  Stable atrophy and diffuse white matter disease.   Original Report Authenticated By: Marin Roberts, M.D.    1. TIA (transient ischemic attack)     MDM  The patient is overall well appearing at this time without any signs of focal neurologic deficits. He has normal neurologic exam at this time. His EKG shows a left axis deviation with a widened QRS which is nonspecific, a first-degree AV block, both of these findings seen on prior EKGs. His labs are overall unremarkable and a CT scan of his head does not show acute hemorrhagic stroke. He has multiple lacunar infarcts representing prior strokes. I have discussed these findings with the patient and family members and the hospitalist who will see the patient in consultation for admission.  ED ECG REPORT  I personally interpreted this EKG   Date: 07/16/2012   Rate: 65  Rhythm: normal sinus rhythm  QRS Axis: left  Intervals: PR prolonged  ST/T Wave abnormalities: normal  Conduction Disutrbances:first-degree A-V block   Narrative Interpretation:   Old EKG Reviewed: unchanged      James Roller, MD 07/16/12 1642

## 2012-07-16 NOTE — H&P (Signed)
[facility] History and Physical  [name] ZOX:[id number] DOB: Jul 31, 1921 DOA: 07/16/2012  Referring physician: Dr. Hyacinth Meeker, emergency room physician PCP: Carylon Perches, MD  Specialists:   Chief Complaint: Left-sided weakness  HPI: [name] is a 79 y.o. male with history of stroke and coronary artery disease in the past. Patient comes to the hospital today with complaints of transient left hand weakness, slurring of speech, left-sided facial droop. Patient has been having these episodes for the past several days, the first episode occurring on Sunday. He refused to come to the hospital for evaluation at that time. He last approximately 45 minutes to one hour. His most recent episode occurred earlier today. Symptoms lasted for approximately 45 minutes and then resolved. Family describes that he noticed slurring of speech, left-sided facial droop and left hand weakness. He denies any chest pain, shortness of breath, fever, dysuria, vomiting or diarrhea. He does have a chronic cough. He has known COPD and wears oxygen at night. He also describes frequent nocturia. Patient was evaluated in the emergency room where CT head was unremarkable. He's been referred for admission for further TIA workup.  Review of Systems: Pertinent positives as per history of present illness, otherwise negative  Past Medical History  Diagnosis Date  . Gout   . Stroke 1993, 1995    chronic balance issues  . Hyperlipidemia   . Emphysema   . Hypertension   . CAD (coronary artery disease)     stent 2006  . Seizure disorder   . GERD (gastroesophageal reflux disease)   . Cancer   . Asthma   . Shortness of breath Jan. 1, 2014    Syncope   Past Surgical History  Procedure Laterality Date  . Cardiac catheterization  08/23/2004    bare metal stenting of the proximal circumflex  . Colonoscopy      X 2  . Colonoscopy  11/01/2011    Procedure: COLONOSCOPY;  Surgeon: Malissa Hippo, MD;  Location: [facility];   Service: Endoscopy;  Laterality: N/A;  100  . Eye surgery    . Nose surgery     Social History:  reports that he quit smoking about 22 years ago. His smoking use included Cigarettes. He has a 15 pack-year smoking history. He has never used smokeless tobacco. He reports that he does not drink alcohol or use illicit drugs.   Allergies  Allergen Reactions  . Penicillins Itching    Family History  Problem Relation Age of Onset  . Emphysema Brother   . Heart disease Sister   . Cancer Brother     lung  . Cancer Brother     throat  . Rheum arthritis Mother   . Rheum arthritis Father   . Colon cancer Neg Hx   . Diabetes      Prior to Admission medications   Medication Sig Start Date End Date Taking? Authorizing Provider  acetaminophen (TYLENOL) 500 MG tablet Take 500 mg by mouth every 6 (six) hours as needed. Pain.   Yes Historical Provider, MD  albuterol (PROAIR HFA) 108 (90 BASE) MCG/ACT inhaler Inhale 1-2 puffs into the lungs every 4 (four) hours as needed. Shortness of breath.   Yes Historical Provider, MD  allopurinol (ZYLOPRIM) 300 MG tablet Take 300 mg by mouth every morning.    Yes Historical Provider, MD  amLODipine (NORVASC) 5 MG tablet Take 2.5 mg by mouth every morning.    Yes Historical Provider, MD  aspirin 325 MG tablet Take  325 mg by mouth every morning.    Yes Historical Provider, MD  clopidogrel (PLAVIX) 75 MG tablet Take 75 mg by mouth every morning.    Yes Historical Provider, MD  donepezil (ARICEPT) 10 MG tablet Take 10 mg by mouth at bedtime.    Yes Historical Provider, MD  famotidine (PEPCID) 20 MG tablet Take 1 tablet (20 mg total) by mouth 2 (two) times daily. 03/06/12  Yes Storm Frisk, MD  isosorbide mononitrate (IMDUR) 60 MG 24 hr tablet Take 60 mg by mouth every morning.   Yes Historical Provider, MD  losartan (COZAAR) 100 MG tablet Take 100 mg by mouth every morning.    Yes Historical Provider, MD  metoprolol tartrate (LOPRESSOR) 25 MG tablet Take 25 mg  by mouth every morning.    Yes Historical Provider, MD  nitroGLYCERIN (NITROSTAT) 0.4 MG SL tablet Place 1 tablet (0.4 mg total) under the tongue every 5 (five) minutes as needed for chest pain. 1 tablet under tongue at onset of chest pain;you may repeat every 5 minutes for up to 3 doses. 03/28/12  Yes Gaylord Shih, MD  Oxcarbazepine (TRILEPTAL) 300 MG tablet Take 300-450 mg by mouth 2 (two) times daily. 300 in the AM; 450 in the PM.   Yes Historical Provider, MD  oxybutynin (DITROPAN) 5 MG tablet Take 5 mg by mouth 2 (two) times daily.    Yes Historical Provider, MD  pravastatin (PRAVACHOL) 40 MG tablet Take 40 mg by mouth daily. Daily at bedtime   Yes Historical Provider, MD  sennosides-docusate sodium (SENOKOT-S) 8.6-50 MG tablet Take 1 tablet by mouth 2 (two) times daily. 11/01/11  Yes Malissa Hippo, MD  Tamsulosin HCl (FLOMAX) 0.4 MG CAPS Take 0.4 mg by mouth at bedtime.    Yes Historical Provider, MD  tiotropium (SPIRIVA HANDIHALER) 18 MCG inhalation capsule Place 1 capsule (18 mcg total) into inhaler and inhale daily. 07/04/11 07/16/12 Yes Storm Frisk, MD  VITAMIN B1-B12 IM Inject into the muscle every 30 (thirty) days. Once a month   Yes Historical Provider, MD   Physical Exam: Filed Vitals:   07/16/12 1503 07/16/12 1504 07/16/12 1600 07/16/12 1800  BP:  120/47  120/50  Pulse: 65 63 66 56  Temp:  98.3 F (36.8 C)    TempSrc:  Oral    Resp: 22 18 15 14   SpO2: 93% 92% 94% 95%     General:  No acute distress  Eyes: Pupils are equal round react to light, extraocular motions are intact  ENT: Mucous members are moist, uvula is midline  Neck: Supple  Cardiovascular: S1, S2, regular rate and rhythm  Respiratory: Clear to auscultation bilaterally  Abdomen: Soft, nontender, nondistended, positive bowel sounds  Skin: No rashes  Musculoskeletal: 2+ pedal edema bilaterally, bruising noted over the lateral aspect of right ankle, full range of motion of right ankle  Psychiatric:  Normal affect, cough with exam he Neurologic: Cranial nerves appear to be intact, strength in lower extremities equal bilaterally, 5 out of 5. There is a very mild left-sided pronator drift in his upper extremities.   Labs on Admission:  Basic Metabolic Panel:  Recent Labs Lab 07/16/12 1521  NA 140  K 4.1  CL 103  CO2 30  GLUCOSE 145*  BUN 16  CREATININE 1.73*  CALCIUM 8.8   Liver Function Tests:  Recent Labs Lab 07/16/12 1521  AST 13  ALT 11  ALKPHOS 54  BILITOT 0.3  PROT 5.9*  ALBUMIN 3.3*  No results found for this basename: LIPASE, AMYLASE,  in the last 168 hours No results found for this basename: AMMONIA,  in the last 168 hours CBC:  Recent Labs Lab 07/16/12 1521  WBC 6.2  NEUTROABS 3.5  HGB 12.6*  HCT 36.8*  MCV 98.7  PLT 118*   Cardiac Enzymes:  Recent Labs Lab 07/16/12 1521  TROPONINI <0.30    BNP (last 3 results) No results found for this basename: PROBNP,  in the last 8760 hours CBG: No results found for this basename: GLUCAP,  in the last 168 hours  Radiological Exams on Admission: Dg Ankle Complete Right  07/16/2012   *RADIOLOGY REPORT*  Clinical Data: Right ankle pain and swelling status post fall  RIGHT ANKLE - COMPLETE 3+ VIEW  Comparison: None.  Findings: Cortical irregularity at the distal aspect of the fibula is likely secondary to remote trauma or chronic degenerative changes.  No acute fracture or dislocation.  The ankle mortise is approximated.  No osteochondral defect.  There is no joint effusion.  Small posterior and plantar calcaneal spurs are noted. There is mild diffuse soft tissue swelling about the ankle.  Osseous mineralization is normal.  Prominent atherosclerotic calcifications are noted within the lower leg.  IMPRESSION: 1.  No acute fracture or dislocation. 2.  Diffuse soft tissue swelling.  3. Posterior and plantar calcaneal spurs.   Original Report Authenticated By: Rise Mu, M.D.   Ct Head Wo  Contrast  07/16/2012   *RADIOLOGY REPORT*  Clinical Data: Speech difficulties.  Facial droop and weakness.  CT HEAD WITHOUT CONTRAST  Technique:  Contiguous axial images were obtained from the base of the skull through the vertex without contrast.  Comparison: CT head without contrast 01/16/2012.  Findings: Remote lacunar infarcts are present in the basal ganglia bilaterally.  Moderate generalized atrophy white matter disease is present bilaterally.  No acute cortical infarct, hemorrhage, mass lesion is present.  The ventricles are proportionate to the degree of atrophy.  No significant extra-axial fluid collection is present.  The paranasal sinuses and mastoid air cells are clear. Atherosclerotic calcifications are present within the cavernous carotid arteries bilaterally.  IMPRESSION:  1.  No acute intracranial abnormality or significant interval change. 2.  Multiple lacunar infarcts in the basal ganglia bilaterally are stable and remote. 3.  Stable atrophy and diffuse white matter disease.   Original Report Authenticated By: Marin Roberts, M.D.    EKG: Independently reviewed. Sinus rhythm without any acute changes  Assessment/Plan Principal Problem:   TIA (transient ischemic attack) Active Problems:   GOUT   HYPERTENSION   EMPHYSEMA   GERD   Pedal edema   1. TIA. Patient will be admitted to a telemetry bed under observation status. He'll undergo further workup including MRI of the brain, MRA head, carotid Dopplers and 2-D echocardiogram. Will also check hemoglobin A1c fasting lipid panel. Will continue outpatient dose of aspirin and Plavix. We'll request a tele neurology consult for further assistance in management of his antiplatelet agents. We'll hold antihypertensives for now to allow for permissive hypertension. continue statin. 2. Pedal edema. Patient does describe some significant pedal edema. We'll set the patient on low-dose Lasix. Followup echocardiogram 3. Hypertension. We'll  hold antihypertensives for now, refer to #1. He can be resumed within 24 hours. 4. GERD. Stable.   Patient will be admitted to the service of Dr. Ouida Sills. [facility] will be available for an issues until 7 AM on 7/3  Code Status: full code, discussed with patient and  family.  I have encouraged them to discuss this further with the patient's primary care doctor, since DNR may be more appropriate with his advanced age. Family Communication: discussed with patient and multiple family members at the bedside Disposition Plan: discharge home once improved  Time spent:  Marsha Hillman [facility] Pager [phone]  If 7PM-7AM, please contact night-coverage [email or web address] Password [facility] 07/16/2012, 6:26 PM

## 2012-07-17 ENCOUNTER — Observation Stay (HOSPITAL_COMMUNITY): Payer: Medicare Other

## 2012-07-17 DIAGNOSIS — I519 Heart disease, unspecified: Secondary | ICD-10-CM

## 2012-07-17 LAB — HEMOGLOBIN A1C
Hgb A1c MFr Bld: 6 % — ABNORMAL HIGH (ref <5.7)
Mean Plasma Glucose: 126 mg/dL — ABNORMAL HIGH (ref <117)

## 2012-07-17 LAB — GLUCOSE, CAPILLARY: Glucose-Capillary: 115 mg/dL — ABNORMAL HIGH (ref 70–99)

## 2012-07-17 LAB — LIPID PANEL: HDL: 43 mg/dL (ref >39)

## 2012-07-17 MED ORDER — DONEPEZIL HCL 5 MG PO TABS
10.0000 mg | ORAL_TABLET | Freq: Every day | ORAL | Status: DC
  Administered 2012-07-17: 10 mg via ORAL
  Filled 2012-07-17: qty 2

## 2012-07-17 MED ORDER — OXCARBAZEPINE 300 MG PO TABS
450.0000 mg | ORAL_TABLET | Freq: Every day | ORAL | Status: DC
  Administered 2012-07-17: 450 mg via ORAL
  Filled 2012-07-17 (×4): qty 1.5

## 2012-07-17 MED ORDER — METOPROLOL TARTRATE 25 MG PO TABS
25.0000 mg | ORAL_TABLET | Freq: Every morning | ORAL | Status: DC
  Administered 2012-07-17 – 2012-07-18 (×2): 25 mg via ORAL
  Filled 2012-07-17 (×2): qty 1

## 2012-07-17 MED ORDER — ISOSORBIDE MONONITRATE ER 60 MG PO TB24
60.0000 mg | ORAL_TABLET | Freq: Every day | ORAL | Status: DC
  Administered 2012-07-17 – 2012-07-18 (×2): 60 mg via ORAL
  Filled 2012-07-17 (×2): qty 1

## 2012-07-17 MED ORDER — LOSARTAN POTASSIUM 50 MG PO TABS
100.0000 mg | ORAL_TABLET | Freq: Every day | ORAL | Status: DC
  Administered 2012-07-17 – 2012-07-18 (×2): 100 mg via ORAL
  Filled 2012-07-17 (×2): qty 2

## 2012-07-17 MED ORDER — AMLODIPINE BESYLATE 5 MG PO TABS
5.0000 mg | ORAL_TABLET | Freq: Every day | ORAL | Status: DC
  Administered 2012-07-17 – 2012-07-18 (×2): 5 mg via ORAL
  Filled 2012-07-17 (×2): qty 1

## 2012-07-17 NOTE — Progress Notes (Signed)
Patient passed nurse bedside swallow eval, no coughing or difficulty swallowing noted, patient advanced to heart healthy diet.

## 2012-07-17 NOTE — Progress Notes (Signed)
*  PRELIMINARY RESULTS* Echocardiogram 2D Echocardiogram has been performed.  [name] 07/17/2012, 11:12 AM

## 2012-07-17 NOTE — Progress Notes (Signed)
NAMEGUAGE, EFFERSON NO.:  [id number]  MEDICAL RECORD NO.:  [id number]  LOCATION:  [id number]                          FACILITY:  [facility]  PHYSICIAN:  [name], MD       DATE OF BIRTH:  May 18, 1921  DATE OF PROCEDURE:  07/17/2012 DATE OF DISCHARGE:                                PROGRESS NOTE   SUBJECTIVE:  [name] was admitted yesterday after experiencing 2 episodes of transient left hand weakness and associated slurred speech. The first occurred on Sunday, the second occurred yesterday.  He had a CT scan of the brain which revealed no evidence of acute stroke.  He was hospitalized and underwent an MRI of the brain, which did reveal an acute stroke in the right putamen.  He denies any residual left hand weakness or speech difficulty now.  He has a prior history of stroke and has been on Plavix and aspirin.  He has also been on statin therapy and ARB therapy.  He has had good glucose control with a hemoglobin A1c in March of 6.0.  His cholesterol was 140 with an LDL of 71.  He has not had known atrial fibrillation.  He was in sinus rhythm on admission.  OBJECTIVE:  VITAL SIGNS:  Temperature is 98.2, pulse 70, respirations 18, blood pressure 159/82. GENERAL:  He is alert and in his usual state. LUNGS:  Clear. HEART:  Regular with no murmurs. ABDOMEN:  Soft and nontender. EXTREMITIES:  No edema. NEURO:  No focal weakness.  His speech is intact.  Face is symmetric.  IMPRESSION/PLAN: 1. Stroke.  Continue Plavix, aspirin and losartan.  Simvastatin has     been substituted for pravastatin in the hospital.  He has a carotid     ultrasound and an echocardiogram scheduled for today. 2. Hypertension.  Continue losartan, amlodipine, and metoprolol. 3. Chronic kidney disease, stable with a creatinine of 1.7. 4. Coronary artery disease, stable. 5. Hyperlipidemia.  Continue current treatment. 6. History of diabetes, stable on lifestyle measures. 7. History of stroke.   Continue Trileptal. 8. Dementia.  Continue donepezil. 9. Chronic obstructive pulmonary disease.  Continue Spiriva.  Has     recently been started on nighttime oxygen, which will be continued.     [name], MD     ROF/MEDQ  D:  07/17/2012  T:  07/17/2012  Job:  [id number]

## 2012-07-18 LAB — GLUCOSE, CAPILLARY
Glucose-Capillary: 94 mg/dL (ref 70–99)
Glucose-Capillary: 87 mg/dL (ref 70–99)
Glucose-Capillary: 133 mg/dL — ABNORMAL HIGH (ref 70–99)

## 2012-07-18 MED ORDER — METOPROLOL TARTRATE 25 MG PO TABS: 25.0000 mg | ORAL_TABLET | Freq: Every morning | ORAL | Status: DC

## 2012-07-18 MED ORDER — TIOTROPIUM BROMIDE MONOHYDRATE 18 MCG IN CAPS: 18.0000 ug | ORAL_CAPSULE | Freq: Every day | RESPIRATORY_TRACT | Status: DC

## 2012-07-18 NOTE — Care Management Note (Signed)
    Page 1 of 1   07/18/2012     5:26:19 PM   CARE MANAGEMENT NOTE 07/18/2012  Patient:  [name], [name]   Account Number:  [id number]  Date Initiated:  07/18/2012  Documentation initiated by:  Anibal Henderson  Subjective/Objective Assessment:   admitted with CVA. Patient is from home lives alone, with daughter next door, and very attentive family.     Action/Plan:   patient is being discharged today, but denies needs. Daughter and son in the room with patient. they do not feel they need home health, daughter-in-law is an Charity fundraiser, and is available if needed   Anticipated DC Date:  07/25/2012   Anticipated DC Plan:  HOME/SELF CARE      DC Planning Services  CM consult      Choice offered to / List presented to:             Status of service:  Completed, signed off Medicare Important Message given?   (If response is "NO", the following Medicare IM given date fields will be blank) Date Medicare IM given:   Date Additional Medicare IM given:    Discharge Disposition:  HOME/SELF CARE  Per UR Regulation:    If discussed at Long Length of Stay Meetings, dates discussed:    Comments:  07/18/12 1715 Anibal Henderson RN/CM

## 2012-07-18 NOTE — Discharge Summary (Signed)
[id number] 

## 2012-07-18 NOTE — Discharge Summary (Signed)
NAME:  [name], [name] NO.:  [id number]  MEDICAL RECORD NO.:  [id number]  LOCATION:  A329                          FACILITY:  [facility]  PHYSICIAN:  Melvyn Novas, MDDATE OF BIRTH:  05/15/1921  DATE OF ADMISSION:  07/16/2012 DATE OF DISCHARGE:  07/04/2014LH                              DISCHARGE SUMMARY   SUBJECTIVE:  The patient is an 79 year old white male with history of a CVA, hypertension, hyperlipidemia, BPH, known vascular disease, gout, and question of dementia, who was in usual state of health and experienced some tremors, some confusion, and some weakness, essentially was admitted and found to have 2 sub cm acute infarction of his putamen and carotid ultrasound that revealed less than 50% disease bilaterally no significant stenosis.  A 2D echocardiogram revealed no evidence of embolic phenomenal resource.  Patient was hemodynamically stable throughout this hospital stay and ambulate well.  He was alert and oriented throughout his stay.  It was felt the patient did achieve his maximal therapeutic benefit.  There was no worsening of any neurologic symptomatology and was discharged on the following medicines, which include Lopressor 25 mg p.o. b.i.d., Spiriva 18 mcg p.o. daily., ProAir HFA 2 puffs q.i.d., allopurinol 300 mg p.o. daily, Norvasc 5 mg p.o. daily, aspirin 325 p.o. daily, Plavix 75 mg p.o. daily, Aricept 10 mg p.o. at bedtime, Pepcid 20 mg p.o. b.i.d. p.r.n., Imdur 60 mg p.o. daily, Cozaar 100 mg p.o. daily, Lopressor 25 mg p.o. b.i.d., sublingual nitroglycerin 0.4 p.r.n., Trileptal 300 mg p.o. at bedtime, Ditropan 5 mg p.o. daily, Pravachol 40 mg p.o. daily, Flomax 0.4 mg p.o. daily, vitamin B12 p.o. daily, and Senokot 8.6/50 p.o. daily p.r.n.  The patient will follow up with Dr. Ouida Sills in 1 week's time.     Melvyn Novas, MD     RMD/MEDQ  D:  07/18/2012  T:  07/18/2012  Job:  [id number]

## 2012-07-18 NOTE — Progress Notes (Signed)
Pt discharged home today per Dr. Janna Arch. Pt's IV site d/c'd and WNL. Pt's VS stable at this time. Pt provided with home medication list, discharge instructions and prescriptions. Verbalized understanding. Pt left floor via WC in stable condition accompanied by NT.

## 2012-07-23 ENCOUNTER — Encounter: Payer: Self-pay | Admitting: Critical Care Medicine

## 2012-07-28 ENCOUNTER — Other Ambulatory Visit: Payer: Self-pay | Admitting: *Deleted

## 2012-07-28 MED ORDER — TIOTROPIUM BROMIDE MONOHYDRATE 18 MCG IN CAPS: 18.0000 ug | ORAL_CAPSULE | Freq: Every day | RESPIRATORY_TRACT | Status: DC

## 2012-08-12 ENCOUNTER — Ambulatory Visit: Payer: Medicare Other | Admitting: Neurosurgery

## 2012-08-13 ENCOUNTER — Encounter (INDEPENDENT_AMBULATORY_CARE_PROVIDER_SITE_OTHER): Payer: Medicare Other | Admitting: Vascular Surgery

## 2012-08-13 DIAGNOSIS — I714 Abdominal aortic aneurysm, without rupture: Secondary | ICD-10-CM

## 2012-08-14 ENCOUNTER — Other Ambulatory Visit: Payer: Self-pay | Admitting: *Deleted

## 2012-08-14 DIAGNOSIS — I714 Abdominal aortic aneurysm, without rupture: Secondary | ICD-10-CM

## 2012-09-01 ENCOUNTER — Ambulatory Visit (INDEPENDENT_AMBULATORY_CARE_PROVIDER_SITE_OTHER): Payer: Medicare Other | Admitting: Cardiology

## 2012-09-01 ENCOUNTER — Encounter: Payer: Self-pay | Admitting: Cardiology

## 2012-09-01 VITALS — BP 152/77 | HR 60 | Ht 71.5 in | Wt 192.8 lb

## 2012-09-01 DIAGNOSIS — I2510 Atherosclerotic heart disease of native coronary artery without angina pectoris: Secondary | ICD-10-CM

## 2012-09-01 DIAGNOSIS — I714 Abdominal aortic aneurysm, without rupture, unspecified: Secondary | ICD-10-CM

## 2012-09-01 DIAGNOSIS — E785 Hyperlipidemia, unspecified: Secondary | ICD-10-CM

## 2012-09-01 DIAGNOSIS — R609 Edema, unspecified: Secondary | ICD-10-CM

## 2012-09-01 DIAGNOSIS — I10 Essential (primary) hypertension: Secondary | ICD-10-CM

## 2012-09-01 DIAGNOSIS — I951 Orthostatic hypotension: Secondary | ICD-10-CM

## 2012-09-01 DIAGNOSIS — R600 Localized edema: Secondary | ICD-10-CM

## 2012-09-01 NOTE — Progress Notes (Signed)
HPI [name] returns today for evaluation and management of his coronary artery disease, history of abdominal aortic aneurysm, hypertension, hyperlipidemia and lower extremity edema. He's doing relatively well except for a recent fall when he twisted his right ankle. He denied any presyncope or syncope. His edema has been stable. He denies orthopnea or PND. He's had no angina or chest discomfort. He is compliant with his medications. He still gets lightheaded on occasion when he stands up.  Past Medical History  Diagnosis Date  . Gout   . Stroke 1993, 1995    chronic balance issues  . Hyperlipidemia   . Emphysema   . Hypertension   . CAD (coronary artery disease)     stent 2006  . Seizure disorder   . GERD (gastroesophageal reflux disease)   . Cancer   . Asthma   . Shortness of breath Jan. 1, 2014    Syncope    Current Outpatient Prescriptions  Medication Sig Dispense Refill  . allopurinol (ZYLOPRIM) 300 MG tablet Take 300 mg by mouth every morning.       Marland Kitchen amLODipine (NORVASC) 5 MG tablet Take 2.5 mg by mouth every morning.       Marland Kitchen aspirin 325 MG tablet Take 325 mg by mouth every morning.       . clopidogrel (PLAVIX) 75 MG tablet Take 75 mg by mouth every morning.       . donepezil (ARICEPT) 10 MG tablet Take 10 mg by mouth at bedtime.       . famotidine (PEPCID) 20 MG tablet Take 1 tablet (20 mg total) by mouth 2 (two) times daily.  60 tablet  6  . isosorbide mononitrate (IMDUR) 60 MG 24 hr tablet Take 60 mg by mouth every morning.      Marland Kitchen losartan (COZAAR) 100 MG tablet Take 100 mg by mouth every morning.       . metoprolol tartrate (LOPRESSOR) 25 MG tablet Take 1 tablet (25 mg total) by mouth every morning.  60 tablet  3  . nitroGLYCERIN (NITROSTAT) 0.4 MG SL tablet Place 1 tablet (0.4 mg total) under the tongue every 5 (five) minutes as needed for chest pain. 1 tablet under tongue at onset of chest pain;you may repeat every 5 minutes for up to 3 doses.  30 tablet  6  .  Oxcarbazepine (TRILEPTAL) 300 MG tablet Take 300-450 mg by mouth 2 (two) times daily. 300 in the AM; 450 in the PM.      . oxybutynin (DITROPAN) 5 MG tablet Take 5 mg by mouth 2 (two) times daily.       . pravastatin (PRAVACHOL) 40 MG tablet Take 40 mg by mouth daily. Daily at bedtime      . sennosides-docusate sodium (SENOKOT-S) 8.6-50 MG tablet Take 1 tablet by mouth 2 (two) times daily.      . Tamsulosin HCl (FLOMAX) 0.4 MG CAPS Take 0.4 mg by mouth at bedtime.       Marland Kitchen tiotropium (SPIRIVA HANDIHALER) 18 MCG inhalation capsule Place 1 capsule (18 mcg total) into inhaler and inhale daily.  30 capsule  6  . VITAMIN B1-B12 IM Inject into the muscle every 30 (thirty) days. Once a month       No current facility-administered medications for this visit.    Allergies  Allergen Reactions  . Penicillins Itching    Family History  Problem Relation Age of Onset  . Emphysema Brother   . Heart disease Sister   . Cancer  Brother     lung  . Cancer Brother     throat  . Rheum arthritis Mother   . Rheum arthritis Father   . Colon cancer Neg Hx   . Diabetes      History   Social History  . Marital Status: Widowed    Spouse Name: N/A    Number of Children: 2  . Years of Education: N/A   Occupational History  . Retired Garment/textile technologist   Social History Main Topics  . Smoking status: Former Smoker -- 0.30 packs/day for 50 years    Types: Cigarettes    Quit date: 01/15/1990  . Smokeless tobacco: Never Used  . Alcohol Use: No  . Drug Use: No  . Sexual Activity: Not on file   Other Topics Concern  . Not on file   Social History Narrative  . No narrative on file    ROS ALL NEGATIVE EXCEPT THOSE NOTED IN HPI  PE  General Appearance: well developed, well nourished in no acute distress, elderly HEENT: symmetrical face, PERRLA, good dentition  Neck: no JVD, thyromegaly, or adenopathy, trachea midline Chest: symmetric without deformity Cardiac: PMI non-displaced, RRR, normal  S1, S2, no gallop or murmur Lung: clear to ausculation and percussion Vascular: all pulses full without bruits  Abdominal: nondistended, nontender, good bowel sounds, no HSM, no bruits Extremities: no cyanosis, clubbing , 1+ pitting edema at the ankles right worse than left,, no sign of DVT, no varicosities  Skin: normal color, no rashes Neuro: alert and oriented x 3, non-focal Pysch: normal affect  EKG  BMET    Component Value Date/Time   NA 140 07/16/2012 1521   K 4.1 07/16/2012 1521   CL 103 07/16/2012 1521   CO2 30 07/16/2012 1521   GLUCOSE 145* 07/16/2012 1521   BUN 16 07/16/2012 1521   CREATININE 1.73* 07/16/2012 1521   CREATININE 1.39* 08/07/2011 0837   CALCIUM 8.8 07/16/2012 1521   GFRNONAA 33* 07/16/2012 1521   GFRAA 39* 07/16/2012 1521    Lipid Panel     Component Value Date/Time   CHOL 140 07/17/2012 0544   TRIG 128 07/17/2012 0544   HDL 43 07/17/2012 0544   CHOLHDL 3.3 07/17/2012 0544   VLDL 26 07/17/2012 0544   LDLCALC 71 07/17/2012 0544    CBC    Component Value Date/Time   WBC 6.2 07/16/2012 1521   RBC 3.73* 07/16/2012 1521   HGB 12.6* 07/16/2012 1521   HCT 36.8* 07/16/2012 1521   PLT 118* 07/16/2012 1521   MCV 98.7 07/16/2012 1521   MCH 33.8 07/16/2012 1521   MCHC 34.2 07/16/2012 1521   RDW 14.3 07/16/2012 1521   LYMPHSABS 1.7 07/16/2012 1521   MONOABS 0.7 07/16/2012 1521   EOSABS 0.3 07/16/2012 1521   BASOSABS 0.0 07/16/2012 1521

## 2012-09-01 NOTE — Patient Instructions (Addendum)
Your physician recommends that you schedule a follow-up appointment in: 6 MONTHS WITH Dr. Purvis Sheffield

## 2012-09-01 NOTE — Assessment & Plan Note (Signed)
Stable by history and exam. Continue conservative management.

## 2012-09-01 NOTE — Assessment & Plan Note (Signed)
Stable. Continue current medical therapy. Return the office in 6 months.

## 2012-09-01 NOTE — Assessment & Plan Note (Signed)
Stable. We'll not add diuretic because her history was that hypotension. He also does not like frequent urination particularly at night.

## 2012-09-02 ENCOUNTER — Encounter: Payer: Self-pay | Admitting: Vascular Surgery

## 2012-10-03 ENCOUNTER — Other Ambulatory Visit: Payer: Self-pay

## 2012-10-03 MED ORDER — NITROGLYCERIN 0.4 MG SL SUBL: 0.4000 mg | SUBLINGUAL_TABLET | SUBLINGUAL | Status: DC | PRN

## 2012-10-03 MED ORDER — NITROGLYCERIN 0.4 MG SL SUBL: 0.4000 mg | SUBLINGUAL_TABLET | SUBLINGUAL | Status: AC | PRN

## 2012-10-29 ENCOUNTER — Other Ambulatory Visit: Payer: Self-pay | Admitting: *Deleted

## 2012-10-29 MED ORDER — FAMOTIDINE 20 MG PO TABS: 20.0000 mg | ORAL_TABLET | Freq: Two times a day (BID) | ORAL | Status: DC

## 2012-11-27 ENCOUNTER — Telehealth: Payer: Self-pay | Admitting: Cardiology

## 2012-11-27 MED ORDER — ISOSORBIDE MONONITRATE ER 60 MG PO TB24: 60.0000 mg | ORAL_TABLET | Freq: Every morning | ORAL | Status: DC

## 2012-11-27 NOTE — Telephone Encounter (Signed)
Received fax refill request  Rx # [id number] Medication:  Isosorbide MN 60 mg tab SA Qty 30 Sig:  Take one tablet by mouth once a day Physician:  Daleen Squibb

## 2012-11-27 NOTE — Telephone Encounter (Signed)
rx sent to pharmacy by e-script  

## 2012-12-31 ENCOUNTER — Encounter (INDEPENDENT_AMBULATORY_CARE_PROVIDER_SITE_OTHER): Payer: Self-pay | Admitting: *Deleted

## 2013-01-09 ENCOUNTER — Other Ambulatory Visit: Payer: Self-pay | Admitting: Cardiology

## 2013-01-27 ENCOUNTER — Ambulatory Visit (INDEPENDENT_AMBULATORY_CARE_PROVIDER_SITE_OTHER): Payer: Medicare Other | Admitting: Internal Medicine

## 2013-01-27 ENCOUNTER — Encounter (INDEPENDENT_AMBULATORY_CARE_PROVIDER_SITE_OTHER): Payer: Self-pay | Admitting: Internal Medicine

## 2013-01-27 VITALS — BP 128/46 | HR 60 | Temp 98.0°F | Ht 71.5 in | Wt 178.8 lb

## 2013-01-27 DIAGNOSIS — K5900 Constipation, unspecified: Secondary | ICD-10-CM

## 2013-01-27 DIAGNOSIS — K5909 Other constipation: Secondary | ICD-10-CM | POA: Insufficient documentation

## 2013-01-27 NOTE — Patient Instructions (Signed)
Will try him on Linzess 125mcg once a day. PR in 2 weeks.

## 2013-01-27 NOTE — Progress Notes (Signed)
Subjective:     Patient ID: [name], male   DOB: [birth year], 80 y.o.   MRN: [id number]  HPI Here today for f/u of his constipation. He tells me he is having a BM about once a week. His stools are hard. He has had constipation for years. No rectal bleeding or melena. If he has to strain, he may see some blood.  For the most part his appetite is good. Last weight in December of 2013 192. Today his weight is 178. He was last seen in December of 2013 In the past he has tried Miralax and fiber which has really not helped.   Wife deceased x 5 yrs.      2011/11/13 Colonoscopy: Change in bowels, constipation:  Impression:  Examination performed to cecum.  Few diverticula at sigmoid colon and one at ascending colon.  Small external hemorrhoids.  Suspect this constipation is secondary to medications.    Review of Systems see hpi Current Outpatient Prescriptions  Medication Sig Dispense Refill  . allopurinol (ZYLOPRIM) 300 MG tablet Take 300 mg by mouth every morning.       Marland Kitchen amLODipine (NORVASC) 5 MG tablet Take 2.5 mg by mouth every morning.       . clopidogrel (PLAVIX) 75 MG tablet Take 75 mg by mouth every morning.       . donepezil (ARICEPT) 10 MG tablet Take 10 mg by mouth at bedtime.       . famotidine (PEPCID) 20 MG tablet Take 1 tablet (20 mg total) by mouth 2 (two) times daily.  60 tablet  5  . isosorbide mononitrate (IMDUR) 60 MG 24 hr tablet Take 1 tablet (60 mg total) by mouth every morning.  90 tablet  1  . losartan (COZAAR) 100 MG tablet TAKE 1 TABLET BY MOUTH ONCE A DAY.  30 tablet  6  . metoprolol tartrate (LOPRESSOR) 25 MG tablet Take 1 tablet (25 mg total) by mouth every morning.  60 tablet  3  . nitroGLYCERIN (NITROSTAT) 0.4 MG SL tablet Place 1 tablet (0.4 mg total) under the tongue every 5 (five) minutes as needed for chest pain. 1 tablet under tongue at onset of chest pain;you may repeat every 5 minutes for up to 3 doses.  25 tablet  3  . Oxcarbazepine (TRILEPTAL)  300 MG tablet Take 300-450 mg by mouth 2 (two) times daily. 300 in the AM; 450 in the PM.      . oxybutynin (DITROPAN) 5 MG tablet Take 5 mg by mouth 2 (two) times daily.       . pravastatin (PRAVACHOL) 40 MG tablet Take 40 mg by mouth daily. Daily at bedtime      . sennosides-docusate sodium (SENOKOT-S) 8.6-50 MG tablet Take 1 tablet by mouth 2 (two) times daily.      . Tamsulosin HCl (FLOMAX) 0.4 MG CAPS Take 0.4 mg by mouth at bedtime.       Marland Kitchen tiotropium (SPIRIVA HANDIHALER) 18 MCG inhalation capsule Place 1 capsule (18 mcg total) into inhaler and inhale daily.  30 capsule  6  . VITAMIN B1-B12 IM Inject into the muscle every 30 (thirty) days. Once a month       No current facility-administered medications for this visit.   Past Medical History  Diagnosis Date  . Gout   . Stroke 1993, 1995    chronic balance issues  . Hyperlipidemia   . Emphysema   . Hypertension   . CAD (coronary artery disease)  stent 2006  . Seizure disorder   . GERD (gastroesophageal reflux disease)   . Cancer   . Asthma   . Shortness of breath Jan. 1, 2014    Syncope   Past Surgical History  Procedure Laterality Date  . Cardiac catheterization  08/23/2004    bare metal stenting of the proximal circumflex  . Colonoscopy      X 2  . Colonoscopy  11/01/2011    Procedure: COLONOSCOPY;  Surgeon: Rogene Houston, MD;  Location: [facility];  Service: Endoscopy;  Laterality: N/A;  100  . Eye surgery    . Nose surgery     Allergies  Allergen Reactions  . Penicillins Itching         Objective:   Physical Exam  Filed Vitals:   01/27/13 1039  BP: 128/46  Pulse: 60  Temp: 98 F (36.7 C)  Height: 5' 11.5" (1.816 m)  Weight: 178 lb 12.8 oz (81.103 kg)  Alert and oriented. Skin warm and dry. Oral mucosa is moist.   . Sclera anicteric, conjunctivae is pink. Thyroid not enlarged. No cervical lymphadenopathy. Lungs clear. Heart regular rate and rhythm.  Abdomen is soft. Bowel sounds are positive. No  hepatomegaly. No abdominal masses felt. No tenderness.  No edema to lower extremities. Patient is alert and oriented.      Assessment:     Chronic constipation probable medication induced. Recently started Miralax but really has not seen any change in his stool.     Plan:    Linzess 186mcg daily. Stop the Miralax and stool softener for now. PR in 2 weeks.

## 2013-01-30 ENCOUNTER — Telehealth: Payer: Self-pay | Admitting: Cardiology

## 2013-01-30 MED ORDER — ISOSORBIDE MONONITRATE ER 60 MG PO TB24: 60.0000 mg | ORAL_TABLET | Freq: Every morning | ORAL | Status: DC

## 2013-01-30 NOTE — Addendum Note (Signed)
Addended by: Shara Blazing A on: 01/30/2013 05:21 PM   Modules accepted: Orders

## 2013-01-30 NOTE — Telephone Encounter (Signed)
Received fax refill request  Rx # [id number] Medication:  Isosorbide MN 60 mg tab SA Qty 30 Sig:  Take one tablet by mouth once a day Physician:  Verl Blalock

## 2013-01-30 NOTE — Telephone Encounter (Signed)
rx sent to pharmacy by e-script  

## 2013-02-05 ENCOUNTER — Encounter: Payer: Self-pay | Admitting: Critical Care Medicine

## 2013-02-05 ENCOUNTER — Ambulatory Visit (INDEPENDENT_AMBULATORY_CARE_PROVIDER_SITE_OTHER): Payer: Medicare Other | Admitting: Critical Care Medicine

## 2013-02-05 VITALS — BP 112/66 | HR 57 | Temp 97.4°F | Ht 72.0 in | Wt 179.0 lb

## 2013-02-05 DIAGNOSIS — J439 Emphysema, unspecified: Secondary | ICD-10-CM

## 2013-02-05 DIAGNOSIS — J449 Chronic obstructive pulmonary disease, unspecified: Secondary | ICD-10-CM

## 2013-02-05 DIAGNOSIS — J438 Other emphysema: Secondary | ICD-10-CM

## 2013-02-05 NOTE — Progress Notes (Signed)
Subjective:    Patient ID: [name], male    DOB: 03-03-1921, 80 y.o.   MRN: [id number]  HPI  02/05/2013 Chief Complaint  Patient presents with  . Follow-up    Pt c/o SOB in a.m., prod cough with white mucous at night.    Pt seems about the same.  Some white mucus at night. No real chest pain.  Notes some wheezing.   Hard to keep oxygen at night    Past Medical History  Diagnosis Date  . Gout   . Stroke 1993, 1995    chronic balance issues  . Hyperlipidemia   . Emphysema   . Hypertension   . CAD (coronary artery disease)     stent 2006  . Seizure disorder   . GERD (gastroesophageal reflux disease)   . Cancer   . Asthma   . Syncope Jan. 1, 2014    Syncope     Family History  Problem Relation Age of Onset  . Emphysema Brother   . Heart disease Sister   . Cancer Brother     lung  . Cancer Brother     throat  . Rheum arthritis Mother   . Rheum arthritis Father   . Colon cancer Neg Hx   . Diabetes       History   Social History  . Marital Status: Widowed    Spouse Name: N/A    Number of Children: 2  . Years of Education: N/A   Occupational History  . Retired Multimedia programmer   Social History Main Topics  . Smoking status: Former Smoker -- 0.30 packs/day for 50 years    Types: Cigarettes    Quit date: 01/15/1990  . Smokeless tobacco: Never Used  . Alcohol Use: No  . Drug Use: No  . Sexual Activity: Not on file   Other Topics Concern  . Not on file   Social History Narrative  . No narrative on file     Allergies  Allergen Reactions  . Penicillins Itching     Outpatient Prescriptions Prior to Visit  Medication Sig Dispense Refill  . allopurinol (ZYLOPRIM) 300 MG tablet Take 300 mg by mouth every morning.       Marland Kitchen amLODipine (NORVASC) 5 MG tablet Take 2.5 mg by mouth every morning.       . clopidogrel (PLAVIX) 75 MG tablet Take 75 mg by mouth every morning.       . donepezil (ARICEPT) 10 MG tablet Take 10 mg by mouth at bedtime.        . famotidine (PEPCID) 20 MG tablet Take 1 tablet (20 mg total) by mouth 2 (two) times daily.  60 tablet  5  . isosorbide mononitrate (IMDUR) 60 MG 24 hr tablet Take 1 tablet (60 mg total) by mouth every morning.  90 tablet  1  . losartan (COZAAR) 100 MG tablet TAKE 1 TABLET BY MOUTH ONCE A DAY.  30 tablet  6  . metoprolol tartrate (LOPRESSOR) 25 MG tablet Take 1 tablet (25 mg total) by mouth every morning.  60 tablet  3  . nitroGLYCERIN (NITROSTAT) 0.4 MG SL tablet Place 1 tablet (0.4 mg total) under the tongue every 5 (five) minutes as needed for chest pain. 1 tablet under tongue at onset of chest pain;you may repeat every 5 minutes for up to 3 doses.  25 tablet  3  . Oxcarbazepine (TRILEPTAL) 300 MG tablet Take 300-450 mg by mouth 2 (two)  times daily. 300 in the AM; 450 in the PM.      . oxybutynin (DITROPAN) 5 MG tablet Take 5 mg by mouth 2 (two) times daily.       . pravastatin (PRAVACHOL) 40 MG tablet Take 40 mg by mouth daily. Daily at bedtime      . sennosides-docusate sodium (SENOKOT-S) 8.6-50 MG tablet Take 1 tablet by mouth 2 (two) times daily.      . Tamsulosin HCl (FLOMAX) 0.4 MG CAPS Take 0.4 mg by mouth at bedtime.       Marland Kitchen tiotropium (SPIRIVA HANDIHALER) 18 MCG inhalation capsule Place 1 capsule (18 mcg total) into inhaler and inhale daily.  30 capsule  6  . VITAMIN B1-B12 IM Inject into the muscle every 30 (thirty) days. Once a month      . polyethylene glycol (MIRALAX / GLYCOLAX) packet Take 17 g by mouth daily.       No facility-administered medications prior to visit.       Review of Systems  11 point review of systems was reviewed and is unremarkable except for history present    Objective:   Physical Exam BP 112/66  Pulse 57  Temp(Src) 97.4 F (36.3 C) (Oral)  Ht 6' (1.829 m)  Wt 179 lb (81.194 kg)  BMI 24.27 kg/m2  SpO2 94%  Gen: Pleasant, elderly, in no distress,  normal affect  ENT: No lesions,  mouth clear,  oropharynx clear, no postnasal drip  Neck:  No JVD, no TMG, no carotid bruits  Lungs: No use of accessory muscles, no dullness to percussion, distant BS  Cardiovascular: RRR, heart sounds normal, no murmur or gallops, no peripheral edema  Abdomen: soft and NT, no HSM,  BS normal  Musculoskeletal: No deformities, no cyanosis or clubbing  Neuro: alert, non focal  Skin: Warm, no lesions or rashes             Assessment & Plan:  C O P D gold stage A 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 allopurinol (ZYLOPRIM) 300 MG tablet Take 300 mg by mouth every morning.   Marland Kitchen amLODipine (NORVASC) 5 MG tablet Take 2.5 mg by mouth every morning.   . clopidogrel (PLAVIX) 75 MG tablet Take 75 mg by mouth every morning.   . donepezil (ARICEPT) 10 MG tablet Take 10 mg by mouth at bedtime.   . famotidine (PEPCID) 20 MG tablet Take 1 tablet (20 mg total) by mouth 2 (two) times daily.  . isosorbide mononitrate (IMDUR) 60 MG 24 hr tablet Take 1 tablet (60 mg total) by mouth every morning.  Marland Kitchen losartan (COZAAR) 100 MG tablet TAKE 1 TABLET BY MOUTH ONCE A DAY.  . metoprolol tartrate (LOPRESSOR) 25 MG tablet Take 1 tablet (25 mg total) by mouth every morning.  . nitroGLYCERIN (NITROSTAT) 0.4 MG SL tablet Place 1 tablet (0.4 mg total) under the tongue every 5 (five) minutes as needed for chest pain. 1 tablet under tongue at onset of chest pain;you may repeat every 5 minutes for up to 3 doses.  . Oxcarbazepine (TRILEPTAL) 300 MG tablet Take 300-450 mg by mouth 2 (two) times daily. 300 in the AM; 450 in the PM.  . oxybutynin (DITROPAN) 5 MG tablet Take 5 mg by mouth 2 (two) times daily.   . pravastatin (PRAVACHOL) 40 MG tablet Take 40 mg by  mouth daily. Daily at bedtime  . sennosides-docusate sodium (SENOKOT-S) 8.6-50 MG tablet Take 1 tablet by mouth 2 (two) times daily.  . Tamsulosin HCl  (FLOMAX) 0.4 MG CAPS Take 0.4 mg by mouth at bedtime.   Marland Kitchen tiotropium (SPIRIVA HANDIHALER) 18 MCG inhalation capsule Place 1 capsule (18 mcg total) into inhaler and inhale daily.  Marland Kitchen VITAMIN B1-B12 IM Inject into the muscle every 30 (thirty) days. Once a month  . [DISCONTINUED] polyethylene glycol (MIRALAX / GLYCOLAX) packet Take 17 g by mouth daily.

## 2013-02-05 NOTE — Patient Instructions (Signed)
No change in spiriva A pulmonary function test will be obtained Return 6 months

## 2013-02-06 NOTE — Assessment & Plan Note (Signed)
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

## 2013-02-10 ENCOUNTER — Telehealth (INDEPENDENT_AMBULATORY_CARE_PROVIDER_SITE_OTHER): Payer: Self-pay | Admitting: *Deleted

## 2013-02-10 NOTE — Telephone Encounter (Signed)
I advised him to try to take 2 Linzess a day. May come by office for samples.

## 2013-02-10 NOTE — Telephone Encounter (Signed)
The Linzess is not working for [employer]. Would like to speak with Deberah Castle, NP. The return phone number is [phone].

## 2013-02-16 ENCOUNTER — Encounter: Payer: Self-pay | Admitting: Family

## 2013-02-17 ENCOUNTER — Ambulatory Visit (HOSPITAL_COMMUNITY)
Admission: RE | Admit: 2013-02-17 | Discharge: 2013-02-17 | Disposition: A | Discharge status: Home / Self Care | Payer: Medicare Other | Source: Ambulatory Visit | Attending: Family | Admitting: Family

## 2013-02-17 ENCOUNTER — Encounter: Payer: Self-pay | Admitting: Family

## 2013-02-17 ENCOUNTER — Ambulatory Visit (INDEPENDENT_AMBULATORY_CARE_PROVIDER_SITE_OTHER): Payer: Medicare Other | Admitting: Family

## 2013-02-17 VITALS — BP 129/76 | HR 62 | Resp 16 | Ht 71.5 in | Wt 181.0 lb

## 2013-02-17 DIAGNOSIS — I714 Abdominal aortic aneurysm, without rupture, unspecified: Secondary | ICD-10-CM

## 2013-02-17 DIAGNOSIS — I708 Atherosclerosis of other arteries: Secondary | ICD-10-CM | POA: Insufficient documentation

## 2013-02-17 DIAGNOSIS — Z48812 Encounter for surgical aftercare following surgery on the circulatory system: Secondary | ICD-10-CM

## 2013-02-17 NOTE — Patient Instructions (Signed)

## 2013-02-17 NOTE — Progress Notes (Signed)
[facility] [place]  Established Abdominal Aortic Aneurysm  History of Present Illness  [name] is a 80 y.o. (October 15, 1921) male patient of Dr. Donnetta Hutching who presents with chief complaint: follow up for AAA.   The patient does not have abdominal pain, does have chronic lumbar spine issues.  The patient is not a smoker. States he cannot walk much any more, used to walk several miles daily, walks across his house and both thighs and calves feel tight, relieved by rest. His legs feel weak when he stands to shave. He also states that his breathing limits his walking.  The patient has history of stroke in 2005  and also TIA symptoms, last one was last year as manifested by twisting of mouth, transient expressive aphasia.  Pt Diabetic: No Pt smoker: former smoker, quit 20 years ago  Past Medical History  Diagnosis Date  . Gout   . Stroke 1993, 1995    chronic balance issues  . Hyperlipidemia   . Emphysema   . Hypertension   . CAD (coronary artery disease)     stent 2006  . Seizure disorder   . GERD (gastroesophageal reflux disease)   . Cancer   . Asthma   . Syncope Jan. 1, 2014    Syncope  . AAA (abdominal aortic aneurysm)    Past Surgical History  Procedure Laterality Date  . Cardiac catheterization  08/23/2004    bare metal stenting of the proximal circumflex  . Colonoscopy      X 2  . Colonoscopy  11/01/2011    Procedure: COLONOSCOPY;  Surgeon: Rogene Houston, MD;  Location: [facility];  Service: Endoscopy;  Laterality: N/A;  100  . Eye surgery    . Nose surgery     Social History History   Social History  . Marital Status: Widowed    Spouse Name: N/A    Number of Children: 2  . Years of Education: N/A   Occupational History  . Retired Multimedia programmer   Social History Main Topics  . Smoking status: Former Smoker -- 0.30 packs/day for 50 years    Types: Cigarettes    Quit date: 01/15/1990  . Smokeless tobacco: Never Used  .  Alcohol Use: No  . Drug Use: No  . Sexual Activity: Not on file   Other Topics Concern  . Not on file   Social History Narrative  . No narrative on file   Family History Family History  Problem Relation Age of Onset  . Emphysema Brother   . Heart disease Sister   . Cancer Brother     lung  . Cancer Brother     throat  . Rheum arthritis Mother   . Arthritis Mother   . Rheum arthritis Father   . Colon cancer Neg Hx   . Diabetes      Current Outpatient Prescriptions on File Prior to Visit  Medication Sig Dispense Refill  . allopurinol (ZYLOPRIM) 300 MG tablet Take 300 mg by mouth every morning.       Marland Kitchen amLODipine (NORVASC) 5 MG tablet Take 2.5 mg by mouth every morning.       . clopidogrel (PLAVIX) 75 MG tablet Take 75 mg by mouth every morning.       . donepezil (ARICEPT) 10 MG tablet Take 10 mg by mouth at bedtime.       . famotidine (PEPCID) 20 MG tablet Take 1 tablet (20 mg total)  by mouth 2 (two) times daily.  60 tablet  5  . isosorbide mononitrate (IMDUR) 60 MG 24 hr tablet Take 1 tablet (60 mg total) by mouth every morning.  90 tablet  1  . losartan (COZAAR) 100 MG tablet TAKE 1 TABLET BY MOUTH ONCE A DAY.  30 tablet  6  . nitroGLYCERIN (NITROSTAT) 0.4 MG SL tablet Place 1 tablet (0.4 mg total) under the tongue every 5 (five) minutes as needed for chest pain. 1 tablet under tongue at onset of chest pain;you may repeat every 5 minutes for up to 3 doses.  25 tablet  3  . metoprolol tartrate (LOPRESSOR) 25 MG tablet Take 1 tablet (25 mg total) by mouth every morning.  60 tablet  3  . Oxcarbazepine (TRILEPTAL) 300 MG tablet Take 300-450 mg by mouth 2 (two) times daily. 300 in the AM; 450 in the PM.      . oxybutynin (DITROPAN) 5 MG tablet Take 5 mg by mouth 2 (two) times daily.       . pravastatin (PRAVACHOL) 40 MG tablet Take 40 mg by mouth daily. Daily at bedtime      . sennosides-docusate sodium (SENOKOT-S) 8.6-50 MG tablet Take 1 tablet by mouth 2 (two) times daily.      .  Tamsulosin HCl (FLOMAX) 0.4 MG CAPS Take 0.4 mg by mouth at bedtime.       Marland Kitchen tiotropium (SPIRIVA HANDIHALER) 18 MCG inhalation capsule Place 1 capsule (18 mcg total) into inhaler and inhale daily.  30 capsule  6  . VITAMIN B1-B12 IM Inject into the muscle every 30 (thirty) days. Once a month       No current facility-administered medications on file prior to visit.   Allergies  Allergen Reactions  . Penicillins Itching    ROS: See HPI for pertinent positives and negatives.  Physical Examination  Filed Vitals:   02/17/13 0935  BP: 129/76  Pulse: 62  Resp: 16  Height: 5' 11.5" (1.816 m)  Weight: 181 lb (82.101 kg)  SpO2: 93%   Body mass index is 24.9 kg/(m^2).  General: A&O x 3, WD.  Pulmonary: Sym exp, good air movt, CTAB, no rales, rhonchi, or wheezing.  Cardiac: RRR, Nl S1, S2, no Murmur.  Carotid Bruits Left Right   Negative Negative   Aorta is palpable Radial pulses are 2 + and =                          VASCULAR EXAM:                                                                                                         LE Pulses LEFT RIGHT       FEMORAL   palpable   palpable        POPLITEAL  not palpable   not palpable       POSTERIOR TIBIAL  not palpable   not palpable        DORSALIS PEDIS      ANTERIOR  TIBIAL  palpable   palpable     Gastrointestinal: soft, NTND, -G/R, - HSM, - masses, - CVAT B.  Musculoskeletal: M/S 5/5 throughout , Extremities without ischemic changes.   Neurologic: CN 2-12 intact  except is slightly hard of hearing, Pain and light touch intact in extremities, Motor exam as listed above.  Non-Invasive Vascular Imaging  AAA Duplex (02/17/2013) ABDOMINAL AORTA DUPLEX EVALUATION    INDICATION: Follow-up abdominal aortic aneurysm     PREVIOUS INTERVENTION(S):     DUPLEX EXAM:     LOCATION DIAMETER AP (cm) DIAMETER TRANSVERSE (cm) VELOCITIES (cm/sec)  Aorta Proximal 4.63 4.65 99  Aorta Mid 4.05 4.17 69  Aorta Distal 4.04  4.12 38  Right Common Iliac Artery 1.62 1.64 215  Left Common Iliac Artery 1.08 1.12 157    Previous max aortic diameter:  4.67cm x 4.44cm Date: 08/13/2012     ADDITIONAL FINDINGS:     IMPRESSION: 1. Stable bilobed abdominal aortic aneurysm with mild tortuosity observed.  2. The proximal aorta was not well visualized due to anatomy; however, aneurysm appears to extend the length of the abdominal segment. 3. Ectasia of the right common iliac artery is stable with stenotic disease present.    Compared to the previous exam:  No significant change compared to prior exam.    July, 2014 Carotid Duplex: Moderate atherosclerotic disease involving the carotid arteries  bilaterally. Estimated degree of narrowing in the internal carotid  arteries is less than 50% bilaterally.  Medical Decision Making  The patient is a 80 y.o. male who presents with asymptomatic AAA with no increase in size, 4.65 cm is largest diameter. Marland Kitchen He does take a statin and Plavix. The weakness in both legs with walking and standing is more consistent with lumbar spine issues since his pedal pulses are very palpable, but will check ABI's on his return in 6 months.   Based on this patient's exam and diagnostic studies, the patient will follow up in 6 months  with the following studies: AAA Duplex, ABI's.  The threshold for repair is AAA size > 5.5 cm, growth > 1 cm/yr, and symptomatic status.  I emphasized the importance of maximal medical management including strict control of blood pressure, blood glucose, and lipid levels, antiplatelet agents, obtaining regular exercise, and cessation of smoking.   The patient was given information about AAA including signs, symptoms, treatment, and how to minimize the risk of enlargement and rupture of aneurysms.    The patient was advised to call 911 should the patient experience sudden onset abdominal or back pain.   Thank you for allowing [place] to participate in this patient's  care.  Clemon Chambers, RN, MSN, FNP-C [facility] [place] Office: [phone]  Clinic Physician: Early  02/17/2013, 9:44 AM

## 2013-03-05 ENCOUNTER — Telehealth (INDEPENDENT_AMBULATORY_CARE_PROVIDER_SITE_OTHER): Payer: Self-pay | Admitting: *Deleted

## 2013-03-05 DIAGNOSIS — K5900 Constipation, unspecified: Secondary | ICD-10-CM

## 2013-03-05 MED ORDER — LINACLOTIDE 145 MCG PO CAPS: 145.0000 ug | ORAL_CAPSULE | Freq: Every day | ORAL | Status: DC

## 2013-03-05 NOTE — Telephone Encounter (Signed)
Patient states Linzess samples are helping, needs rx called to [facility]

## 2013-03-06 ENCOUNTER — Other Ambulatory Visit: Payer: Self-pay | Admitting: Emergency Medicine

## 2013-03-06 MED ORDER — TIOTROPIUM BROMIDE MONOHYDRATE 18 MCG IN CAPS: 18.0000 ug | ORAL_CAPSULE | Freq: Every day | RESPIRATORY_TRACT | Status: DC

## 2013-03-13 ENCOUNTER — Emergency Department (HOSPITAL_COMMUNITY)
Admission: EM | Admit: 2013-03-13 | Discharge: 2013-03-13 | Disposition: A | Discharge status: Home / Self Care | Payer: Medicare Other | Attending: Emergency Medicine | Admitting: Emergency Medicine

## 2013-03-13 ENCOUNTER — Encounter (HOSPITAL_COMMUNITY): Payer: Self-pay | Admitting: Emergency Medicine

## 2013-03-13 ENCOUNTER — Emergency Department (HOSPITAL_COMMUNITY): Payer: Medicare Other

## 2013-03-13 DIAGNOSIS — Z859 Personal history of malignant neoplasm, unspecified: Secondary | ICD-10-CM | POA: Insufficient documentation

## 2013-03-13 DIAGNOSIS — M109 Gout, unspecified: Secondary | ICD-10-CM | POA: Insufficient documentation

## 2013-03-13 DIAGNOSIS — J438 Other emphysema: Secondary | ICD-10-CM | POA: Insufficient documentation

## 2013-03-13 DIAGNOSIS — Z79899 Other long term (current) drug therapy: Secondary | ICD-10-CM | POA: Insufficient documentation

## 2013-03-13 DIAGNOSIS — Z7902 Long term (current) use of antithrombotics/antiplatelets: Secondary | ICD-10-CM | POA: Insufficient documentation

## 2013-03-13 DIAGNOSIS — Z87891 Personal history of nicotine dependence: Secondary | ICD-10-CM | POA: Insufficient documentation

## 2013-03-13 DIAGNOSIS — Z9861 Coronary angioplasty status: Secondary | ICD-10-CM | POA: Insufficient documentation

## 2013-03-13 DIAGNOSIS — E785 Hyperlipidemia, unspecified: Secondary | ICD-10-CM | POA: Insufficient documentation

## 2013-03-13 DIAGNOSIS — R569 Unspecified convulsions: Secondary | ICD-10-CM

## 2013-03-13 DIAGNOSIS — I2510 Atherosclerotic heart disease of native coronary artery without angina pectoris: Secondary | ICD-10-CM | POA: Insufficient documentation

## 2013-03-13 DIAGNOSIS — K219 Gastro-esophageal reflux disease without esophagitis: Secondary | ICD-10-CM | POA: Insufficient documentation

## 2013-03-13 DIAGNOSIS — G40909 Epilepsy, unspecified, not intractable, without status epilepticus: Secondary | ICD-10-CM | POA: Insufficient documentation

## 2013-03-13 DIAGNOSIS — I10 Essential (primary) hypertension: Secondary | ICD-10-CM | POA: Insufficient documentation

## 2013-03-13 DIAGNOSIS — Z8673 Personal history of transient ischemic attack (TIA), and cerebral infarction without residual deficits: Secondary | ICD-10-CM | POA: Insufficient documentation

## 2013-03-13 DIAGNOSIS — Z880 Allergy status to penicillin: Secondary | ICD-10-CM | POA: Insufficient documentation

## 2013-03-13 DIAGNOSIS — J45909 Unspecified asthma, uncomplicated: Secondary | ICD-10-CM | POA: Insufficient documentation

## 2013-03-13 LAB — URINALYSIS, ROUTINE W REFLEX MICROSCOPIC
Bilirubin Urine: NEGATIVE
Glucose, UA: NEGATIVE mg/dL
Hgb urine dipstick: NEGATIVE
Ketones, ur: NEGATIVE mg/dL
Leukocytes, UA: NEGATIVE
NITRITE: NEGATIVE
Protein, ur: NEGATIVE mg/dL
SPECIFIC GRAVITY, URINE: 1.025 (ref 1.005–1.030)
Urobilinogen, UA: 0.2 mg/dL (ref 0.0–1.0)
pH: 6 (ref 5.0–8.0)

## 2013-03-13 LAB — CBC
HCT: 35.2 % — ABNORMAL LOW (ref 39.0–52.0)
Hemoglobin: 12.2 g/dL — ABNORMAL LOW (ref 13.0–17.0)
MCH: 35 pg — AB (ref 26.0–34.0)
MCHC: 34.7 g/dL (ref 30.0–36.0)
MCV: 100.9 fL — AB (ref 78.0–100.0)
PLATELETS: 108 10*3/uL — AB (ref 150–400)
RBC: 3.49 MIL/uL — ABNORMAL LOW (ref 4.22–5.81)
RDW: 14.4 % (ref 11.5–15.5)
WBC: 4.8 10*3/uL (ref 4.0–10.5)

## 2013-03-13 LAB — COMPREHENSIVE METABOLIC PANEL
ALT: 12 U/L (ref 0–53)
AST: 14 U/L (ref 0–37)
Albumin: 3.5 g/dL (ref 3.5–5.2)
Alkaline Phosphatase: 62 U/L (ref 39–117)
BILIRUBIN TOTAL: 0.2 mg/dL — AB (ref 0.3–1.2)
BUN: 20 mg/dL (ref 6–23)
CALCIUM: 8.9 mg/dL (ref 8.4–10.5)
CHLORIDE: 102 meq/L (ref 96–112)
CO2: 27 meq/L (ref 19–32)
CREATININE: 2.2 mg/dL — AB (ref 0.50–1.35)
GFR calc non Af Amer: 25 mL/min — ABNORMAL LOW (ref >90)
GFR, EST AFRICAN AMERICAN: 29 mL/min — AB (ref >90)
GLUCOSE: 153 mg/dL — AB (ref 70–99)
Potassium: 4.6 mEq/L (ref 3.7–5.3)
Sodium: 138 mEq/L (ref 137–147)
Total Protein: 6.4 g/dL (ref 6.0–8.3)

## 2013-03-13 LAB — PROTIME-INR
INR: 0.97 (ref 0.00–1.49)
PROTHROMBIN TIME: 12.7 s (ref 11.6–15.2)

## 2013-03-13 LAB — APTT: aPTT: 30 seconds (ref 24–37)

## 2013-03-13 LAB — TROPONIN I: Troponin I: <0.3 ng/mL (ref <0.30)

## 2013-03-13 MED ORDER — SODIUM CHLORIDE 0.9 % IV SOLN
INTRAVENOUS | Status: DC
  Administered 2013-03-13: 14:00:00 via INTRAVENOUS

## 2013-03-13 NOTE — ED Provider Notes (Addendum)
CSN: [id number]     Arrival date & time 03/13/13  1251 History  This chart was scribed for Janice Norrie, MD by Ludger Nutting, ED Scribe. This patient was seen in room APA14/[facility]14 and the patient's care was started 1:42 PM.    Chief Complaint  Patient presents with  . Loss of Consciousness      The history is provided by the patient and a relative (grandson via telephone call). No language interpreter was used.    HPI Comments: [name] is a 80 y.o. male with past medical history of syncope, seizure disorder, stroke who presents to the Emergency Department complaining of an episode of syncope that occurred about 2 hours ago, alittle after noon. Pt states he was sitting in his recliner speaking to his grandson on the phone prior to this episode. He reports having a feeling in his head described as tightness along with vision darkness just prior to having syncope. Pt states he became sleepy and does not recall anything until after waking up. Spoke with grandson who was on the phone with patient during this episode. Grandson states pt may have been out for about 2-3 minutes. Pt states he felt as though it was going to "come back on" after he woke up. Grandson states pt had a similar witnessed episode that lasted 2-4 minutes 4 days ago. Grandson states pt was in the car with him when his eyes stared ahead and had slurred speech. Grandson denies any jerking or twitching, facial asymmetry or extremity weakness or numbness. Pt states he felt very hot just prior to that episode, but he was wearing a lot of clothes b/o the cold and they were in a car with the heater running. He states currently he feels better but pt points to head and states he has some discomfort that he says is a tightness. Pt denies actual headache.  Pt had similar symptoms in July 2014 and was diagnosed with a TIA/CVA. He denies chest pain, SOB, nausea, abdominal pain, diaphoresis, vomiting. Pt has a history of left sided weakness for  which he was admitted in July and is followed by Dr. Merlene Laughter who believes it is related to seizures.   Patient has a history coronary artery disease and is followed by [facility], Dr. Verl Blalock. He states he has a stent. On his last catheterization he had a 80% blockage of his LAD and his stent was occluded.  PCP Dr Willey Blade Neurologist Dr Merlene Laughter Cardiologist Dr Verl Blalock   Past Medical History  Diagnosis Date  . Gout   . Stroke 1993, 1995    chronic balance issues  . Hyperlipidemia   . Emphysema   . Hypertension   . CAD (coronary artery disease)     stent 2006  . Seizure disorder   . GERD (gastroesophageal reflux disease)   . Cancer   . Asthma   . Syncope Jan. 1, 2014    Syncope  . AAA (abdominal aortic aneurysm)    Past Surgical History  Procedure Laterality Date  . Cardiac catheterization  08/23/2004    bare metal stenting of the proximal circumflex  . Colonoscopy      X 2  . Colonoscopy  11/01/2011    Procedure: COLONOSCOPY;  Surgeon: Rogene Houston, MD;  Location: [facility];  Service: Endoscopy;  Laterality: N/A;  100  . Eye surgery    . Nose surgery     Family History  Problem Relation Age of Onset  . Emphysema Brother   .  Heart disease Sister   . Cancer Brother     lung  . Cancer Brother     throat  . Rheum arthritis Mother   . Arthritis Mother   . Rheum arthritis Father   . Colon cancer Neg Hx   . Diabetes     History  Substance Use Topics  . Smoking status: Former Smoker -- 0.30 packs/day for 50 years    Types: Cigarettes    Quit date: 01/15/1990  . Smokeless tobacco: Never Used  . Alcohol Use: No  retired Lives alone Uses oxygen at night  Review of Systems  Neurological: Positive for syncope, speech difficulty and light-headedness. Negative for facial asymmetry and weakness.      Allergies  Penicillins  Home Medications   Current Outpatient Rx  Name  Route  Sig  Dispense  Refill  . allopurinol (ZYLOPRIM) 300 MG tablet   Oral   Take 300 mg  by mouth every morning.          Marland Kitchen amLODipine (NORVASC) 5 MG tablet   Oral   Take 2.5 mg by mouth every morning.          . clopidogrel (PLAVIX) 75 MG tablet   Oral   Take 75 mg by mouth every morning.          . donepezil (ARICEPT) 10 MG tablet   Oral   Take 10 mg by mouth at bedtime.          . famotidine (PEPCID) 20 MG tablet   Oral   Take 1 tablet (20 mg total) by mouth 2 (two) times daily.   60 tablet   5   . isosorbide mononitrate (IMDUR) 60 MG 24 hr tablet   Oral   Take 1 tablet (60 mg total) by mouth every morning.   90 tablet   1   . Linaclotide (LINZESS) 145 MCG CAPS capsule   Oral   Take 1 capsule (145 mcg total) by mouth daily.   30 capsule   3   . losartan (COZAAR) 100 MG tablet      TAKE 1 TABLET BY MOUTH ONCE A DAY.   30 tablet   6   . metoprolol tartrate (LOPRESSOR) 25 MG tablet   Oral   Take 1 tablet (25 mg total) by mouth every morning.   60 tablet   3   . nitroGLYCERIN (NITROSTAT) 0.4 MG SL tablet   Sublingual   Place 1 tablet (0.4 mg total) under the tongue every 5 (five) minutes as needed for chest pain. 1 tablet under tongue at onset of chest pain;you may repeat every 5 minutes for up to 3 doses.   25 tablet   3   . Oxcarbazepine (TRILEPTAL) 300 MG tablet   Oral   Take 300-450 mg by mouth 2 (two) times daily. 300 in the AM; 450 in the PM.         . oxybutynin (DITROPAN) 5 MG tablet   Oral   Take 5 mg by mouth 2 (two) times daily.          . pravastatin (PRAVACHOL) 40 MG tablet   Oral   Take 40 mg by mouth daily. Daily at bedtime         . sennosides-docusate sodium (SENOKOT-S) 8.6-50 MG tablet   Oral   Take 1 tablet by mouth 2 (two) times daily.         . Tamsulosin HCl (FLOMAX) 0.4 MG CAPS   Oral  Take 0.4 mg by mouth at bedtime.          Marland Kitchen tiotropium (SPIRIVA HANDIHALER) 18 MCG inhalation capsule   Inhalation   Place 1 capsule (18 mcg total) into inhaler and inhale daily.   30 capsule   6   .  VITAMIN B1-B12 IM   Intramuscular   Inject into the muscle every 30 (thirty) days. Once a month          BP 129/63  Pulse 60  Temp(Src) 97.4 F (36.3 C) (Oral)  Resp 12  Ht 5' 11.5" (1.816 m)  Wt 190 lb (86.183 kg)  BMI 26.13 kg/m2  SpO2 98%  Vital signs normal   Physical Exam  Nursing note and vitals reviewed. Constitutional: He is oriented to person, place, and time. He appears well-developed and well-nourished.  Non-toxic appearance. He does not appear ill. No distress.  HENT:  Head: Normocephalic and atraumatic.  Right Ear: External ear normal.  Left Ear: External ear normal.  Nose: Nose normal. No mucosal edema or rhinorrhea.  Mouth/Throat: Oropharynx is clear and moist and mucous membranes are normal. No dental abscesses or uvula swelling.  Eyes: Conjunctivae and EOM are normal. Pupils are equal, round, and reactive to light.  Neck: Normal range of motion and full passive range of motion without pain. Neck supple.  Cardiovascular: Normal rate, regular rhythm and normal heart sounds.  Exam reveals no gallop and no friction rub.   No murmur heard. Pulmonary/Chest: Effort normal and breath sounds normal. No respiratory distress. He has no wheezes. He has no rhonchi. He has no rales. He exhibits no tenderness and no crepitus.  Abdominal: Soft. Normal appearance and bowel sounds are normal. He exhibits no distension. There is no tenderness. There is no rebound and no guarding.  Musculoskeletal: Normal range of motion. He exhibits no edema and no tenderness.  Moves all extremities well.   Neurological: He is alert and oriented to person, place, and time. He has normal strength. No cranial nerve deficit or sensory deficit.  No CN deficits. No pronator drift. Grip strengths are equal. No motor weakness    Skin: Skin is warm, dry and intact. No rash noted. No erythema. No pallor.  Psychiatric: He has a normal mood and affect. His speech is normal and behavior is normal. His mood  appears not anxious.    ED Course  Procedures (including critical care time)  DIAGNOSTIC STUDIES: Oxygen Saturation is 98% on RA, normal by my interpretation.    COORDINATION OF CARE: 2:20 PM Discussed treatment plan with pt at bedside and pt agreed to plan. Review of patient's old chart show although family states he was admitted in July with a seizure he actually had an acute infarct seen on MRI scan of his brain.  3:47 PM Discussed head CT and lab results with pt. Discussed the need for an MRI.   When patient was admitted in July he had carotid Doppler ultrasound showing less than 50% blockages bilaterally. He also had a 2-D echo that was normal.  1725 p.m. patient discussed with Dr. Luan Pulling who actually knows the patient. He states he thought the patient had atypical seizures when he has seen in the past. He states his symptoms seem to have gotten better when his Trileptal dose was increased. He asked me to see if there was a way to send for a Trileptal level which I ordered. Patient states he has a panic button to press that he wears at home because she  lives alone. He states he normally can tell when these episodes are going to occur because he gets a tight feeling in his head. He was advised to press the panic button and then to lie or sit down so he does not get hurt if he falls.    Labs Review Results for orders placed during the hospital encounter of 03/13/13  CBC      Result Value Ref Range   WBC 4.8  4.0 - 10.5 K/uL   RBC 3.49 (*) 4.22 - 5.81 MIL/uL   Hemoglobin 12.2 (*) 13.0 - 17.0 g/dL   HCT 76.1 (*) 51.8 - 34.3 %   MCV 100.9 (*) 78.0 - 100.0 fL   MCH 35.0 (*) 26.0 - 34.0 pg   MCHC 34.7  30.0 - 36.0 g/dL   RDW 73.5  78.9 - 78.4 %   Platelets 108 (*) 150 - 400 K/uL  PROTIME-INR      Result Value Ref Range   Prothrombin Time 12.7  11.6 - 15.2 seconds   INR 0.97  0.00 - 1.49  APTT      Result Value Ref Range   aPTT 30  24 - 37 seconds  TROPONIN I      Result Value  Ref Range   Troponin I <0.30  <0.30 ng/mL  COMPREHENSIVE METABOLIC PANEL      Result Value Ref Range   Sodium 138  137 - 147 mEq/L   Potassium 4.6  3.7 - 5.3 mEq/L   Chloride 102  96 - 112 mEq/L   CO2 27  19 - 32 mEq/L   Glucose, Bld 153 (*) 70 - 99 mg/dL   BUN 20  6 - 23 mg/dL   Creatinine, Ser 7.84 (*) 0.50 - 1.35 mg/dL   Calcium 8.9  8.4 - 12.8 mg/dL   Total Protein 6.4  6.0 - 8.3 g/dL   Albumin 3.5  3.5 - 5.2 g/dL   AST 14  0 - 37 U/L   ALT 12  0 - 53 U/L   Alkaline Phosphatase 62  39 - 117 U/L   Total Bilirubin 0.2 (*) 0.3 - 1.2 mg/dL   GFR calc non Af Amer 25 (*) >90 mL/min   GFR calc Af Amer 29 (*) >90 mL/min  URINALYSIS, ROUTINE W REFLEX MICROSCOPIC      Result Value Ref Range   Color, Urine YELLOW  YELLOW   APPearance CLEAR  CLEAR   Specific Gravity, Urine 1.025  1.005 - 1.030   pH 6.0  5.0 - 8.0   Glucose, UA NEGATIVE  NEGATIVE mg/dL   Hgb urine dipstick NEGATIVE  NEGATIVE   Bilirubin Urine NEGATIVE  NEGATIVE   Ketones, ur NEGATIVE  NEGATIVE mg/dL   Protein, ur NEGATIVE  NEGATIVE mg/dL   Urobilinogen, UA 0.2  0.0 - 1.0 mg/dL   Nitrite NEGATIVE  NEGATIVE   Leukocytes, UA NEGATIVE  NEGATIVE   Laboratory interpretation all normal except stable anemia, stable CRI   Imaging Review Ct Head Wo Contrast  03/13/2013   CLINICAL DATA:  Headache and loss of consciousness.  EXAM: CT HEAD WITHOUT CONTRAST  TECHNIQUE: Contiguous axial images were obtained from the base of the skull through the vertex without intravenous contrast.  COMPARISON:  07/16/2012  FINDINGS: Brain shows generalized atrophy. There are old infarctions affecting the right thalamus and the basal ganglia right more than left. There chronic small vessel changes within the deep white matter. No sign of acute infarction, mass lesion, hemorrhage, hydrocephalus or  extra-axial collection. No calvarial lesion. No fluid in the sinuses, middle ears or mastoids.  IMPRESSION: No acute finding. Old ischemic changes affecting  the thalami, basal ganglia and hemispheric white matter .   Electronically Signed   By: Nelson Chimes M.D.   On: 03/13/2013 14:44   Mr Brain Wo Contrast  03/13/2013   CLINICAL DATA:  Syncopal episode. Loss of consciousness today. Previous acute infarct.  EXAM: MRI HEAD WITHOUT CONTRAST  TECHNIQUE: Multiplanar, multiecho pulse sequences of the brain and surrounding structures were obtained without intravenous contrast.  COMPARISON:  CT head without contrast from the same day. MRI brain 07/16/2012.  FINDINGS: The diffusion-weighted images demonstrate no evidence for acute or subacute infarct. Remote hemorrhagic infarcts are evident within the right basal ganglia. There is ex vacuo dilation of the right lateral ventricle. Remote lacunar infarcts in the bowel my are stable. A remote lacunar infarct in the left corona radiata is stable.  Moderate periventricular and subcortical white matter changes are additionally noted. Mild generalized atrophy is stable. No significant extra-axial fluid collection is present.  Flow is present in the major intracranial arteries. The patient is status post bilateral lens extractions. The a small fluid level is present of the left maxillary sinus. Minimal mucosal thickening is present in the anterior right ethmoid air cells. The remaining paranasal sinuses are clear.  IMPRESSION: 1. No acute intracranial abnormality. 2. Remote hemorrhagic infarcts of the right basal ganglia have evolved since the prior exam. 3. Otherwise stable atrophy and diffuse white matter disease.   Electronically Signed   By: Lawrence Santiago M.D.   On: 03/13/2013 17:49     EKG Interpretation   Date/Time:  Friday March 13 2013 13:33:37 EST Ventricular Rate:  59 PR Interval:  296 QRS Duration: 134 QT Interval:  452 QTC Calculation: 447 R Axis:   -67 Text Interpretation:  Sinus bradycardia with 1st degree A-V block Left  axis deviation Non-specific intra-ventricular conduction block When  compared  with ECG of 16-Jul-2012 15:01, QRS duration has increased  Confirmed by Muhammed Teutsch  MD-I, Brinleigh Tew ([id number]) on 03/13/2013 10:55:09 PM      MDM   Final diagnoses:  Seizure    Plan discharge   Rolland Porter, MD, FACEP    I personally performed the services described in this documentation, which was scribed in my presence. The recorded information has been reviewed and considered.  Rolland Porter, MD, [place]  Janice Norrie, MD 03/13/13 [id number]  Janice Norrie, MD 03/13/13 [phone]

## 2013-03-13 NOTE — ED Notes (Signed)
Patient with no complaints at this time. Respirations even and unlabored. Skin warm/dry. Discharge instructions reviewed with patient at this time. Patient given opportunity to voice concerns/ask questions. IV removed per policy and band-aid applied to site. Patient discharged at this time and left Emergency Department with steady gait.  

## 2013-03-13 NOTE — ED Notes (Signed)
Pt states he passed out Monday and had a near syncopal episode this morning

## 2013-03-13 NOTE — Discharge Instructions (Signed)
Continue your medications as prescribed. Press your medic alert button if you feel another episode start to happen and sit or lie down. Call Dr Freddie Apley office on Monday, March 2nd to get an appointment to discuss your seizure medication. Return if you feel worse.  Continue to let your family drive you to your appointments.

## 2013-03-18 LAB — 10-HYDROXYCARBAZEPINE: Triliptal/MTB(Oxcarbazepin): 27.8 ug/mL (ref 8.0–35.0)

## 2013-04-06 ENCOUNTER — Ambulatory Visit (INDEPENDENT_AMBULATORY_CARE_PROVIDER_SITE_OTHER): Payer: Medicare Other | Admitting: Critical Care Medicine

## 2013-04-06 DIAGNOSIS — J438 Other emphysema: Secondary | ICD-10-CM

## 2013-04-06 DIAGNOSIS — J439 Emphysema, unspecified: Secondary | ICD-10-CM

## 2013-04-06 LAB — PULMONARY FUNCTION TEST
DL/VA % PRED: 52 %
DL/VA: 2.41 ml/min/mmHg/L
DLCO unc % pred: 38 %
DLCO unc: 12.59 ml/min/mmHg
FEF 25-75 PRE: 0.98 L/s
FEF 25-75 Post: 1.22 L/sec
FEF2575-%Change-Post: 23 %
FEF2575-%PRED-POST: 82 %
FEF2575-%PRED-PRE: 66 %
FEV1-%Change-Post: 7 %
FEV1-%Pred-Post: 94 %
FEV1-%Pred-Pre: 87 %
FEV1-PRE: 2.16 L
FEV1-Post: 2.31 L
FEV1FVC-%Change-Post: 4 %
FEV1FVC-%PRED-PRE: 87 %
FEV6-%CHANGE-POST: 2 %
FEV6-%PRED-POST: 107 %
FEV6-%Pred-Pre: 105 %
FEV6-Post: 3.54 L
FEV6-Pre: 3.47 L
FEV6FVC-%Change-Post: 0 %
FEV6FVC-%Pred-Post: 104 %
FEV6FVC-%Pred-Pre: 105 %
FVC-%Change-Post: 2 %
FVC-%Pred-Post: 103 %
FVC-%Pred-Pre: 100 %
FVC-Post: 3.69 L
FVC-Pre: 3.58 L
POST FEV6/FVC RATIO: 96 %
Post FEV1/FVC ratio: 63 %
Pre FEV1/FVC ratio: 60 %
Pre FEV6/FVC Ratio: 97 %
RV % pred: 63 %
RV: 1.82 L
TLC % PRED: 75 %
TLC: 5.34 L

## 2013-04-06 NOTE — Progress Notes (Signed)
PFT done today. 

## 2013-04-14 ENCOUNTER — Ambulatory Visit (INDEPENDENT_AMBULATORY_CARE_PROVIDER_SITE_OTHER): Payer: Medicare Other | Admitting: Adult Health

## 2013-04-14 ENCOUNTER — Encounter: Payer: Self-pay | Admitting: Adult Health

## 2013-04-14 VITALS — BP 128/53 | HR 75 | Ht 71.0 in | Wt 186.0 lb

## 2013-04-14 DIAGNOSIS — I951 Orthostatic hypotension: Secondary | ICD-10-CM

## 2013-04-14 DIAGNOSIS — R55 Syncope and collapse: Secondary | ICD-10-CM

## 2013-04-14 DIAGNOSIS — I2510 Atherosclerotic heart disease of native coronary artery without angina pectoris: Secondary | ICD-10-CM

## 2013-04-14 DIAGNOSIS — I714 Abdominal aortic aneurysm, without rupture, unspecified: Secondary | ICD-10-CM

## 2013-04-14 DIAGNOSIS — I10 Essential (primary) hypertension: Secondary | ICD-10-CM

## 2013-04-14 NOTE — Assessment & Plan Note (Signed)
Review of cardiac catheterization in 2010 demonstrated a new CT oh of the proximal circumflex this was in the site of a stent that appeared to been placed in 2006. He had good collaterals. The left anterior descending artery had 56% proximal stenosis and was not thought that the stenosis was flow-limiting. However, this could present problems over time with progression. He was continued on medical management.  He will be continued on Plavix, echocardiogram will be completed as discussed. Will make further recommendations post test results.

## 2013-04-14 NOTE — Progress Notes (Signed)
HPI: [name] is a 80 year old patient to be est. with Dr. Harl Strickland, we are following for ongoing assessment and management of CAD, history of abdominal aortic aneurysm, hypertension, and hyperlipidemia, with chronic lower extremity edema. He was last seen by Dr. Verl Strickland in August of 2014.    He is followed by Dr. Willey Strickland, with requested visit todayby him in the setting of syncope. Apparently had recently gone to the emergency room where he was accompanied by his daughter stating that he was unconscious for about 15 seconds. He has been treated in the past by neurology for seizures with Trileptal. He has not had any previous documented arrhythmias.   Dr. Willey Strickland is asked to see him today to exclude any arrhythmia explaining his recent episodes. Possible event recorder placement. He does have a history of old CVA and had an MRI during his recent emergency room visit revealing no new areas of infarction. He is continued on Plavix, isosorbide, pain, losartan. Blood pressure in Dr. Ria Strickland office dated 04/09/2013 demonstrated blood pressure 112/54.   The patient states he is having these episodes chronically 2-3 times a week, usually around 1 to 2 PM in the afternoon. They can occur at rest, while riding in a car, or on one occurrence after getting out of a car and walking to his. His daughter states that most recently while taking him to [employer] for lunch, he made a gurgling sound, his lips turned clear, and he fell forward. He was unconscious for approximately 15 to 30 seconds. He was not cold clammy or diaphoretic. His daughter put the seat back and had him lying down, and he quickly recovered. He states that this happens he is normally very tired the rest of the day.   He denies chest pain but has noticed that he has been more short of breath lately. He denies palpitations, or heart rate increase.    Allergies  Allergen Reactions  . Penicillins Itching    Current Outpatient Prescriptions    Medication Sig Dispense Refill  . allopurinol (ZYLOPRIM) 300 MG tablet Take 300 mg by mouth every morning.       Marland Kitchen amLODipine (NORVASC) 5 MG tablet Take 2.5 mg by mouth every morning.       . clopidogrel (PLAVIX) 75 MG tablet Take 75 mg by mouth every morning.       . donepezil (ARICEPT) 10 MG tablet Take 10 mg by mouth at bedtime.       . famotidine (PEPCID) 20 MG tablet Take 1 tablet (20 mg total) by mouth 2 (two) times daily.  60 tablet  5  . isosorbide mononitrate (IMDUR) 60 MG 24 hr tablet Take 1 tablet (60 mg total) by mouth every morning.  90 tablet  1  . Linaclotide (LINZESS) 145 MCG CAPS capsule Take 1 capsule (145 mcg total) by mouth daily.  30 capsule  3  . losartan (COZAAR) 100 MG tablet Take 100 mg by mouth daily.      . metoprolol tartrate (LOPRESSOR) 25 MG tablet Take 1 tablet (25 mg total) by mouth every morning.  60 tablet  3  . nitroGLYCERIN (NITROSTAT) 0.4 MG SL tablet Place 1 tablet (0.4 mg total) under the tongue every 5 (five) minutes as needed for chest pain. 1 tablet under tongue at onset of chest pain;you may repeat every 5 minutes for up to 3 doses.  25 tablet  3  . Oxcarbazepine (TRILEPTAL) 300 MG tablet Take 600 mg by mouth. 2 tabs  am,2 tabs pm total 1200 mg daily      . oxybutynin (DITROPAN) 5 MG tablet Take 5 mg by mouth 2 (two) times daily.       . pravastatin (PRAVACHOL) 40 MG tablet Take 40 mg by mouth daily. Daily at bedtime      . sennosides-docusate sodium (SENOKOT-S) 8.6-50 MG tablet Take 1 tablet by mouth 2 (two) times daily.      . Tamsulosin HCl (FLOMAX) 0.4 MG CAPS Take 0.4 mg by mouth at bedtime.       Marland Kitchen tiotropium (SPIRIVA HANDIHALER) 18 MCG inhalation capsule Place 1 capsule (18 mcg total) into inhaler and inhale daily.  30 capsule  6  . VITAMIN B1-B12 IM Inject into the muscle every 30 (thirty) days. Once a month       No current facility-administered medications for this visit.    Past Medical History  Diagnosis Date  . Gout   . Stroke 1993,  1995    chronic balance issues  . Hyperlipidemia   . Emphysema   . Hypertension   . CAD (coronary artery disease)     stent 2006  . Seizure disorder   . GERD (gastroesophageal reflux disease)   . Cancer   . Asthma   . Syncope Jan. 1, 2014    Syncope  . AAA (abdominal aortic aneurysm)     Past Surgical History  Procedure Laterality Date  . Cardiac catheterization  08/23/2004    bare metal stenting of the proximal circumflex  . Colonoscopy      X 2  . Colonoscopy  11/01/2011    Procedure: COLONOSCOPY;  Surgeon: Rogene Houston, MD;  Location: [facility];  Service: Endoscopy;  Laterality: N/A;  100  . Eye surgery    . Nose surgery      ROS: Review of systems complete and found to be negative unless listed above  PHYSICAL EXAM BP 128/53  Pulse 75  Ht 5\' 11"  (1.803 m)  Wt 186 lb (84.369 kg)  BMI 25.95 kg/m2  General: Well developed, well nourished, in no acute distress Head: Eyes PERRLA, No xanthomas.   Normal cephalic and atramatic  Lungs: Clear bilaterally to auscultation and percussion. Heart: HRRR S1 S2,  1/6 systolic murmur.  Pulses are 2+ & equal.            No carotid bruit. No JVD.  No abdominal bruits. No femoral bruits. Abdomen: Bowel sounds are positive, abdomen soft and non-tender without masses or                  Hernia's noted. Msk:  Back normal, normal gait. Uses cane for ambulation.  Normal strength and tone for age. Extremities: No clubbing, cyanosis or edema.  DP +1 Neuro: Alert and oriented X 3. Psych:  Good affect, responds appropriately  EKG: NSR with 1 st degree AV block. LAFB. Rate of 70 bpm.  ASSESSMENT AND PLAN

## 2013-04-14 NOTE — Assessment & Plan Note (Signed)
Most recent evaluation of AAA was completed in February of 2015, demonstrating bilateral abdominal aortic aneurysm with mild tortuosity observed. Diameter 4.63 diameter transverse 4.65. There was akinesia of the right common iliac artery and a stable person not disease present

## 2013-04-14 NOTE — Assessment & Plan Note (Signed)
Orthostatics were completed in the office. Lying 146/73 with a heart rate of 71; sitting 136/72 with a heart rate of 70; standing 146/69 with a heart rate of 76. He was asymptomatic.

## 2013-04-14 NOTE — Assessment & Plan Note (Signed)
His episodes are concerning. Currently, uncertain etiology. He may be having a cumulative effect from hypertensives entering at the same time, this may be related to arrhythmias, he may be having pseudoseizures as his family members have mentioned had been diagnosed in the past.  After discussion with Dr. Harl Bowie, plan an echocardiogram to evaluate for LV dysfunction, which may be causing symptoms. He will have a 14 day cardiac monitor placed to evaluate for arrhythmias causing symptoms.  Once these tests are completed, he will followup with Dr. Harl Bowie in the office to discuss test results and evaluate his symptoms with medication changes as outlined below.

## 2013-04-14 NOTE — Progress Notes (Deleted)
Name: [name]    DOB: [birth year]  Age: 80 y.o.  MR#: [id number]       PCP:  Asencion Noble, MD      Insurance: Payor: MEDICARE / Plan: MEDICARE PART A AND B / Product Type: *No Product type* /   CC:    Chief Complaint  Patient presents with  . Coronary Artery Disease  . Loss of Consciousness    VS Filed Vitals:   04/14/13 1344  BP: 128/53  Pulse: 75  Height: 5\' 11"  (1.803 m)  Weight: 186 lb (84.369 kg)    Weights Current Weight  04/14/13 186 lb (84.369 kg)  03/13/13 190 lb (86.183 kg)  02/17/13 181 lb (82.101 kg)    Blood Pressure  BP Readings from Last 3 Encounters:  04/14/13 128/53  03/13/13 120/72  02/17/13 129/76     Admit date:  (Not on file) Last encounter with RMR:  Visit date not found   Allergy Penicillins  Current Outpatient Prescriptions  Medication Sig Dispense Refill  . allopurinol (ZYLOPRIM) 300 MG tablet Take 300 mg by mouth every morning.       Marland Kitchen amLODipine (NORVASC) 5 MG tablet Take 2.5 mg by mouth every morning.       . clopidogrel (PLAVIX) 75 MG tablet Take 75 mg by mouth every morning.       . donepezil (ARICEPT) 10 MG tablet Take 10 mg by mouth at bedtime.       . famotidine (PEPCID) 20 MG tablet Take 1 tablet (20 mg total) by mouth 2 (two) times daily.  60 tablet  5  . isosorbide mononitrate (IMDUR) 60 MG 24 hr tablet Take 1 tablet (60 mg total) by mouth every morning.  90 tablet  1  . Linaclotide (LINZESS) 145 MCG CAPS capsule Take 1 capsule (145 mcg total) by mouth daily.  30 capsule  3  . losartan (COZAAR) 100 MG tablet Take 100 mg by mouth daily.      . metoprolol tartrate (LOPRESSOR) 25 MG tablet Take 1 tablet (25 mg total) by mouth every morning.  60 tablet  3  . nitroGLYCERIN (NITROSTAT) 0.4 MG SL tablet Place 1 tablet (0.4 mg total) under the tongue every 5 (five) minutes as needed for chest pain. 1 tablet under tongue at onset of chest pain;you may repeat every 5 minutes for up to 3 doses.  25 tablet  3  . Oxcarbazepine (TRILEPTAL) 300  MG tablet Take 600 mg by mouth. 2 tabs am,2 tabs pm total 1200 mg daily      . oxybutynin (DITROPAN) 5 MG tablet Take 5 mg by mouth 2 (two) times daily.       . pravastatin (PRAVACHOL) 40 MG tablet Take 40 mg by mouth daily. Daily at bedtime      . sennosides-docusate sodium (SENOKOT-S) 8.6-50 MG tablet Take 1 tablet by mouth 2 (two) times daily.      . Tamsulosin HCl (FLOMAX) 0.4 MG CAPS Take 0.4 mg by mouth at bedtime.       Marland Kitchen tiotropium (SPIRIVA HANDIHALER) 18 MCG inhalation capsule Place 1 capsule (18 mcg total) into inhaler and inhale daily.  30 capsule  6  . VITAMIN B1-B12 IM Inject into the muscle every 30 (thirty) days. Once a month       No current facility-administered medications for this visit.    Discontinued Meds:   There are no discontinued medications.  Patient Active Problem List   Diagnosis Date Noted  . Aftercare following  surgery of the circulatory system, NEC 02/17/2013  . Chronic constipation 01/27/2013  . TIA (transient ischemic attack) 07/16/2012  . Pedal edema 07/16/2012  . Lumbar herniated disc 01/30/2012  . DDD (degenerative disc disease), lumbosacral 01/01/2012  . Neuropathy 01/01/2012  . Leg pain 01/01/2012  . Constipation 09/26/2011  . Abdominal aneurysm without mention of rupture 08/14/2011  . AAA (abdominal aortic aneurysm) 02/13/2011  . Orthostatic hypotension 12/08/2010  . Difficulty in walking 07/27/2010  . Abnormality of gait 07/27/2010  . CHRONIC RHINITIS 02/09/2009  . CORONARY ATHEROSCLEROSIS NATIVE CORONARY ARTERY 01/10/2009  . CHEST PAIN 01/10/2009  . HYPERLIPIDEMIA 01/06/2009  . GOUT 01/06/2009  . HYPERTENSION 01/06/2009  . STROKE 01/06/2009  . EMPHYSEMA 01/06/2009  . C O P D gold stage A 01/06/2009  . GERD 01/06/2009    LABS    Component Value Date/Time   NA 138 03/13/2013 1427   NA 140 07/16/2012 1521   NA 133* 01/17/2012 0516   K 4.6 03/13/2013 1427   K 4.1 07/16/2012 1521   K 4.8 01/17/2012 0516   CL 102 03/13/2013 1427   CL 103  07/16/2012 1521   CL 98 01/17/2012 0516   CO2 27 03/13/2013 1427   CO2 30 07/16/2012 1521   CO2 29 01/17/2012 0516   GLUCOSE 153* 03/13/2013 1427   GLUCOSE 145* 07/16/2012 1521   GLUCOSE 89 01/17/2012 0516   BUN 20 03/13/2013 1427   BUN 16 07/16/2012 1521   BUN 16 01/17/2012 0516   CREATININE 2.20* 03/13/2013 1427   CREATININE 1.73* 07/16/2012 1521   CREATININE 1.49* 01/17/2012 0516   CREATININE 1.39* 08/07/2011 0837   CALCIUM 8.9 03/13/2013 1427   CALCIUM 8.8 07/16/2012 1521   CALCIUM 8.4 01/17/2012 0516   GFRNONAA 25* 03/13/2013 1427   GFRNONAA 33* 07/16/2012 1521   GFRNONAA 40* 01/17/2012 0516   GFRAA 29* 03/13/2013 1427   GFRAA 39* 07/16/2012 1521   GFRAA 46* 01/17/2012 0516   CMP     Component Value Date/Time   NA 138 03/13/2013 1427   K 4.6 03/13/2013 1427   CL 102 03/13/2013 1427   CO2 27 03/13/2013 1427   GLUCOSE 153* 03/13/2013 1427   BUN 20 03/13/2013 1427   CREATININE 2.20* 03/13/2013 1427   CREATININE 1.39* 08/07/2011 0837   CALCIUM 8.9 03/13/2013 1427   PROT 6.4 03/13/2013 1427   ALBUMIN 3.5 03/13/2013 1427   AST 14 03/13/2013 1427   ALT 12 03/13/2013 1427   ALKPHOS 62 03/13/2013 1427   BILITOT 0.2* 03/13/2013 1427   GFRNONAA 25* 03/13/2013 1427   GFRAA 29* 03/13/2013 1427       Component Value Date/Time   WBC 4.8 03/13/2013 1427   WBC 6.2 07/16/2012 1521   WBC 6.4 01/17/2012 0516   HGB 12.2* 03/13/2013 1427   HGB 12.6* 07/16/2012 1521   HGB 12.5* 01/17/2012 0516   HCT 35.2* 03/13/2013 1427   HCT 36.8* 07/16/2012 1521   HCT 35.7* 01/17/2012 0516   MCV 100.9* 03/13/2013 1427   MCV 98.7 07/16/2012 1521   MCV 97.5 01/17/2012 0516    Lipid Panel     Component Value Date/Time   CHOL 140 07/17/2012 0544   TRIG 128 07/17/2012 0544   HDL 43 07/17/2012 0544   CHOLHDL 3.3 07/17/2012 0544   VLDL 26 07/17/2012 0544   LDLCALC 71 07/17/2012 0544    ABG    Component Value Date/Time   PHART 7.420 11/30/2008 1536   PCO2ART 39.0 11/30/2008 1536   PO2ART 67.5*  11/30/2008 1536   HCO3 24.9* 11/30/2008 1536   TCO2 22.6 11/30/2008  1536   O2SAT 93.3 11/30/2008 1536     No results found for this basename: TSH   BNP (last 3 results) No results found for this basename: PROBNP,  in the last 8760 hours Cardiac Panel (last 3 results) No results found for this basename: CKTOTAL, CKMB, TROPONINI, RELINDX,  in the last 72 hours  Iron/TIBC/Ferritin No results found for this basename: iron, tibc, ferritin     EKG Orders placed during the hospital encounter of 03/13/13  . ED EKG  . ED EKG  . EKG 12-LEAD  . EKG 12-LEAD  . EKG     Prior Assessment and Plan Problem List as of 04/14/2013     Cardiovascular and Mediastinum   HYPERTENSION   CORONARY ATHEROSCLEROSIS NATIVE CORONARY ARTERY   Last Assessment & Plan   09/01/2012 Office Visit Written 09/01/2012  9:54 AM by Renella Cunas, MD     Stable. Continue current medical therapy. Return the office in 6 months.    STROKE   Orthostatic hypotension   Last Assessment & Plan   12/08/2010 Office Visit Written 12/08/2010 11:22 AM by Renella Cunas, MD     4 suspect cautions reviewed at length. If he continues to have orthostasis, we could discontinue his low-dose metoprolol.    AAA (abdominal aortic aneurysm)   Abdominal aneurysm without mention of rupture   Last Assessment & Plan   09/01/2012 Office Visit Written 09/01/2012  9:54 AM by Renella Cunas, MD     Stable by history and exam. Continue conservative management.    TIA (transient ischemic attack)     Respiratory   CHRONIC RHINITIS   EMPHYSEMA   Last Assessment & Plan   07/04/2011 Office Visit Written 07/04/2011  4:28 PM by Elsie Stain, MD     Chronic obstructive lung disease gold stage C. stable at this time Plan Maintain Spiriva daily Return 6 months    C O P D gold stage A   Last Assessment & Plan   02/05/2013 Office Visit Written 02/06/2013  4:02 PM by Elsie Stain, MD     Gold stage A COPD with moderate peripheral airflow obstruction improved on Spiriva  Plan Maintain Spiriva Return 6 months       Digestive   GERD   Constipation   Chronic constipation     Nervous and Auditory   Neuropathy     Musculoskeletal and Integument   DDD (degenerative disc disease), lumbosacral   Lumbar herniated disc     Other   HYPERLIPIDEMIA   GOUT   CHEST PAIN   Difficulty in walking   Abnormality of gait   Leg pain   Pedal edema   Last Assessment & Plan   09/01/2012 Office Visit Written 09/01/2012  9:55 AM by Renella Cunas, MD     Stable. We'll not add diuretic because her history was that hypotension. He also does not like frequent urination particularly at night.    Aftercare following surgery of the circulatory system, NEC       Imaging: No results found.

## 2013-04-14 NOTE — Assessment & Plan Note (Signed)
Medications that he takes at the same time in the morning. These include metoprolol, Cozaar, amlodipine, and isosorbide. He may be having a cumulative effect of vasodilation associated with these medications as they enter his system all in the same time.  After discussion with Dr. Harl Bowie on site revealing all medications, we will discontinue amlodipine and isosorbide. And metoprolol, and losartan.

## 2013-04-14 NOTE — Patient Instructions (Signed)
Your physician recommends that you schedule a follow-up appointment in:  1 month with Dr Harl Bowie  Your physician has recommended that you wear an event monitor. Event monitors are medical devices that record the heart's electrical activity. Doctors most often [place] these monitors to diagnose arrhythmias. Arrhythmias are problems with the speed or rhythm of the heartbeat. The monitor is a small, portable device. You can wear one while you do your normal daily activities. This is usually used to diagnose what is causing palpitations/syncope (passing out).  14 days  Your physician has requested that you have an echocardiogram. Echocardiography is a painless test that uses sound waves to create images of your heart. It provides your doctor with information about the size and shape of your heart and how well your heart's chambers and valves are working. This procedure takes approximately one hour. There are no restrictions for this procedure.  Please call our office if you have any other episodes.  Check your blood sugar when you have these episodes.  STOP IMDUR and AMLODIPINE

## 2013-04-16 DIAGNOSIS — R55 Syncope and collapse: Secondary | ICD-10-CM

## 2013-04-17 ENCOUNTER — Encounter (HOSPITAL_COMMUNITY): Payer: Self-pay | Admitting: Emergency Medicine

## 2013-04-17 ENCOUNTER — Inpatient Hospital Stay (HOSPITAL_COMMUNITY)
Admission: EM | Admit: 2013-04-17 | Discharge: 2013-04-20 | DRG: 291 | Disposition: A | Discharge status: Home / Self Care | Payer: Medicare Other | Attending: Internal Medicine | Admitting: Internal Medicine

## 2013-04-17 ENCOUNTER — Emergency Department (HOSPITAL_COMMUNITY): Payer: Medicare Other

## 2013-04-17 DIAGNOSIS — Z833 Family history of diabetes mellitus: Secondary | ICD-10-CM

## 2013-04-17 DIAGNOSIS — I2510 Atherosclerotic heart disease of native coronary artery without angina pectoris: Secondary | ICD-10-CM | POA: Diagnosis present

## 2013-04-17 DIAGNOSIS — J449 Chronic obstructive pulmonary disease, unspecified: Secondary | ICD-10-CM | POA: Diagnosis present

## 2013-04-17 DIAGNOSIS — Z8673 Personal history of transient ischemic attack (TIA), and cerebral infarction without residual deficits: Secondary | ICD-10-CM

## 2013-04-17 DIAGNOSIS — N183 Chronic kidney disease, stage 3 unspecified: Secondary | ICD-10-CM | POA: Diagnosis present

## 2013-04-17 DIAGNOSIS — Z7902 Long term (current) use of antithrombotics/antiplatelets: Secondary | ICD-10-CM

## 2013-04-17 DIAGNOSIS — I714 Abdominal aortic aneurysm, without rupture, unspecified: Secondary | ICD-10-CM | POA: Diagnosis present

## 2013-04-17 DIAGNOSIS — I509 Heart failure, unspecified: Secondary | ICD-10-CM | POA: Diagnosis present

## 2013-04-17 DIAGNOSIS — J4489 Other specified chronic obstructive pulmonary disease: Secondary | ICD-10-CM | POA: Diagnosis present

## 2013-04-17 DIAGNOSIS — Z87891 Personal history of nicotine dependence: Secondary | ICD-10-CM

## 2013-04-17 DIAGNOSIS — M109 Gout, unspecified: Secondary | ICD-10-CM | POA: Diagnosis present

## 2013-04-17 DIAGNOSIS — I129 Hypertensive chronic kidney disease with stage 1 through stage 4 chronic kidney disease, or unspecified chronic kidney disease: Secondary | ICD-10-CM | POA: Diagnosis present

## 2013-04-17 DIAGNOSIS — E785 Hyperlipidemia, unspecified: Secondary | ICD-10-CM | POA: Diagnosis present

## 2013-04-17 DIAGNOSIS — N184 Chronic kidney disease, stage 4 (severe): Secondary | ICD-10-CM

## 2013-04-17 DIAGNOSIS — K219 Gastro-esophageal reflux disease without esophagitis: Secondary | ICD-10-CM | POA: Diagnosis present

## 2013-04-17 DIAGNOSIS — I5031 Acute diastolic (congestive) heart failure: Principal | ICD-10-CM | POA: Diagnosis present

## 2013-04-17 DIAGNOSIS — J984 Other disorders of lung: Secondary | ICD-10-CM | POA: Diagnosis present

## 2013-04-17 DIAGNOSIS — J189 Pneumonia, unspecified organism: Secondary | ICD-10-CM | POA: Diagnosis present

## 2013-04-17 DIAGNOSIS — I10 Essential (primary) hypertension: Secondary | ICD-10-CM | POA: Diagnosis present

## 2013-04-17 DIAGNOSIS — D649 Anemia, unspecified: Secondary | ICD-10-CM | POA: Diagnosis present

## 2013-04-17 DIAGNOSIS — E119 Type 2 diabetes mellitus without complications: Secondary | ICD-10-CM | POA: Diagnosis present

## 2013-04-17 DIAGNOSIS — D696 Thrombocytopenia, unspecified: Secondary | ICD-10-CM | POA: Diagnosis present

## 2013-04-17 DIAGNOSIS — R55 Syncope and collapse: Secondary | ICD-10-CM

## 2013-04-17 DIAGNOSIS — J438 Other emphysema: Secondary | ICD-10-CM | POA: Diagnosis present

## 2013-04-17 DIAGNOSIS — Z79899 Other long term (current) drug therapy: Secondary | ICD-10-CM

## 2013-04-17 DIAGNOSIS — G40909 Epilepsy, unspecified, not intractable, without status epilepticus: Secondary | ICD-10-CM | POA: Diagnosis present

## 2013-04-17 DIAGNOSIS — E876 Hypokalemia: Secondary | ICD-10-CM | POA: Diagnosis present

## 2013-04-17 HISTORY — DX: Chronic kidney disease, stage 4 (severe): N18.4

## 2013-04-17 LAB — CBC WITH DIFFERENTIAL/PLATELET
Basophils Absolute: 0 10*3/uL (ref 0.0–0.1)
Basophils Relative: 0 % (ref 0–1)
EOS ABS: 0.2 10*3/uL (ref 0.0–0.7)
Eosinophils Relative: 4 % (ref 0–5)
HCT: 36.4 % — ABNORMAL LOW (ref 39.0–52.0)
HEMOGLOBIN: 12.4 g/dL — AB (ref 13.0–17.0)
LYMPHS ABS: 1.5 10*3/uL (ref 0.7–4.0)
LYMPHS PCT: 30 % (ref 12–46)
MCH: 34.5 pg — AB (ref 26.0–34.0)
MCHC: 34.1 g/dL (ref 30.0–36.0)
MCV: 101.4 fL — ABNORMAL HIGH (ref 78.0–100.0)
Monocytes Absolute: 0.5 10*3/uL (ref 0.1–1.0)
Monocytes Relative: 10 % (ref 3–12)
NEUTROS PCT: 56 % (ref 43–77)
Neutro Abs: 2.8 10*3/uL (ref 1.7–7.7)
Platelets: 111 10*3/uL — ABNORMAL LOW (ref 150–400)
RBC: 3.59 MIL/uL — AB (ref 4.22–5.81)
RDW: 14.7 % (ref 11.5–15.5)
WBC: 5 10*3/uL (ref 4.0–10.5)

## 2013-04-17 LAB — URINALYSIS, ROUTINE W REFLEX MICROSCOPIC
BILIRUBIN URINE: NEGATIVE
Glucose, UA: NEGATIVE mg/dL
Hgb urine dipstick: NEGATIVE
KETONES UR: NEGATIVE mg/dL
Leukocytes, UA: NEGATIVE
NITRITE: NEGATIVE
Protein, ur: NEGATIVE mg/dL
Specific Gravity, Urine: 1.02 (ref 1.005–1.030)
UROBILINOGEN UA: 0.2 mg/dL (ref 0.0–1.0)
pH: 5.5 (ref 5.0–8.0)

## 2013-04-17 LAB — COMPREHENSIVE METABOLIC PANEL
ALK PHOS: 71 U/L (ref 39–117)
ALT: 14 U/L (ref 0–53)
AST: 16 U/L (ref 0–37)
Albumin: 3.9 g/dL (ref 3.5–5.2)
BUN: 17 mg/dL (ref 6–23)
CO2: 27 meq/L (ref 19–32)
Calcium: 9.2 mg/dL (ref 8.4–10.5)
Chloride: 102 mEq/L (ref 96–112)
Creatinine, Ser: 2.12 mg/dL — ABNORMAL HIGH (ref 0.50–1.35)
GFR, EST AFRICAN AMERICAN: 30 mL/min — AB (ref >90)
GFR, EST NON AFRICAN AMERICAN: 26 mL/min — AB (ref >90)
GLUCOSE: 145 mg/dL — AB (ref 70–99)
Potassium: 3.9 mEq/L (ref 3.7–5.3)
Sodium: 141 mEq/L (ref 137–147)
Total Bilirubin: 0.4 mg/dL (ref 0.3–1.2)
Total Protein: 7.1 g/dL (ref 6.0–8.3)

## 2013-04-17 LAB — PRO B NATRIURETIC PEPTIDE: PRO B NATRI PEPTIDE: 3970 pg/mL — AB (ref 0–450)

## 2013-04-17 LAB — LACTIC ACID, PLASMA: Lactic Acid, Venous: 1.7 mmol/L (ref 0.5–2.2)

## 2013-04-17 MED ORDER — ALBUTEROL SULFATE (2.5 MG/3ML) 0.083% IN NEBU: 5.0000 mg | INHALATION_SOLUTION | Freq: Once | RESPIRATORY_TRACT | Status: DC

## 2013-04-17 MED ORDER — SODIUM CHLORIDE 0.9 % IV SOLN
INTRAVENOUS | Status: DC
  Administered 2013-04-17 – 2013-04-19 (×2): via INTRAVENOUS

## 2013-04-17 MED ORDER — ALLOPURINOL 300 MG PO TABS
300.0000 mg | ORAL_TABLET | Freq: Every morning | ORAL | Status: DC
  Administered 2013-04-18 – 2013-04-20 (×3): 300 mg via ORAL
  Filled 2013-04-17 (×3): qty 1

## 2013-04-17 MED ORDER — TIOTROPIUM BROMIDE MONOHYDRATE 18 MCG IN CAPS
18.0000 ug | ORAL_CAPSULE | Freq: Every day | RESPIRATORY_TRACT | Status: DC
  Administered 2013-04-18 – 2013-04-20 (×3): 18 ug via RESPIRATORY_TRACT
  Filled 2013-04-17: qty 5

## 2013-04-17 MED ORDER — METOPROLOL TARTRATE 25 MG PO TABS
25.0000 mg | ORAL_TABLET | Freq: Every morning | ORAL | Status: DC
  Administered 2013-04-18 – 2013-04-20 (×3): 25 mg via ORAL
  Filled 2013-04-17 (×3): qty 1

## 2013-04-17 MED ORDER — CLOPIDOGREL BISULFATE 75 MG PO TABS
75.0000 mg | ORAL_TABLET | Freq: Every morning | ORAL | Status: DC
  Administered 2013-04-18 – 2013-04-20 (×3): 75 mg via ORAL
  Filled 2013-04-17 (×3): qty 1

## 2013-04-17 MED ORDER — ACETAMINOPHEN 325 MG PO TABS: 650.0000 mg | ORAL_TABLET | Freq: Four times a day (QID) | ORAL | Status: DC | PRN

## 2013-04-17 MED ORDER — IPRATROPIUM BROMIDE 0.02 % IN SOLN: 0.5000 mg | Freq: Once | RESPIRATORY_TRACT | Status: DC

## 2013-04-17 MED ORDER — HEPARIN SODIUM (PORCINE) 5000 UNIT/ML IJ SOLN
5000.0000 [IU] | Freq: Three times a day (TID) | INTRAMUSCULAR | Status: DC
  Administered 2013-04-18 – 2013-04-20 (×8): 5000 [IU] via SUBCUTANEOUS
  Filled 2013-04-17 (×8): qty 1

## 2013-04-17 MED ORDER — LEVOFLOXACIN IN D5W 500 MG/100ML IV SOLN
500.0000 mg | Freq: Once | INTRAVENOUS | Status: AC
  Administered 2013-04-18: 500 mg via INTRAVENOUS
  Filled 2013-04-17: qty 100

## 2013-04-17 MED ORDER — TAMSULOSIN HCL 0.4 MG PO CAPS
0.4000 mg | ORAL_CAPSULE | Freq: Every day | ORAL | Status: DC
  Administered 2013-04-18 – 2013-04-19 (×3): 0.4 mg via ORAL
  Filled 2013-04-17 (×3): qty 1

## 2013-04-17 MED ORDER — LOSARTAN POTASSIUM 50 MG PO TABS
100.0000 mg | ORAL_TABLET | Freq: Every day | ORAL | Status: DC
  Administered 2013-04-18 – 2013-04-20 (×3): 100 mg via ORAL
  Filled 2013-04-17 (×3): qty 2

## 2013-04-17 MED ORDER — LEVOFLOXACIN IN D5W 250 MG/50ML IV SOLN
250.0000 mg | Freq: Every day | INTRAVENOUS | Status: DC
  Administered 2013-04-18 – 2013-04-19 (×2): 250 mg via INTRAVENOUS
  Filled 2013-04-17 (×3): qty 50

## 2013-04-17 MED ORDER — ALBUTEROL SULFATE (2.5 MG/3ML) 0.083% IN NEBU: 2.5000 mg | INHALATION_SOLUTION | RESPIRATORY_TRACT | Status: DC | PRN

## 2013-04-17 MED ORDER — IPRATROPIUM-ALBUTEROL 0.5-2.5 (3) MG/3ML IN SOLN
3.0000 mL | Freq: Once | RESPIRATORY_TRACT | Status: AC
Start: 2013-04-17 — End: 2013-04-17
  Administered 2013-04-17: 3 mL via RESPIRATORY_TRACT
  Filled 2013-04-17: qty 3

## 2013-04-17 MED ORDER — FUROSEMIDE 10 MG/ML IJ SOLN
40.0000 mg | Freq: Two times a day (BID) | INTRAMUSCULAR | Status: DC
Start: 2013-04-18 — End: 2013-04-19
  Administered 2013-04-18 (×2): 40 mg via INTRAVENOUS
  Filled 2013-04-17 (×2): qty 4

## 2013-04-17 MED ORDER — DONEPEZIL HCL 5 MG PO TABS
10.0000 mg | ORAL_TABLET | Freq: Every day | ORAL | Status: DC
  Administered 2013-04-18 – 2013-04-19 (×3): 10 mg via ORAL
  Filled 2013-04-17 (×3): qty 2

## 2013-04-17 MED ORDER — ACETAMINOPHEN 650 MG RE SUPP: 650.0000 mg | Freq: Four times a day (QID) | RECTAL | Status: DC | PRN

## 2013-04-17 MED ORDER — OXCARBAZEPINE 300 MG PO TABS
600.0000 mg | ORAL_TABLET | Freq: Two times a day (BID) | ORAL | Status: DC
  Administered 2013-04-18 – 2013-04-20 (×6): 600 mg via ORAL
  Filled 2013-04-17 (×10): qty 2

## 2013-04-17 MED ORDER — LINACLOTIDE 145 MCG PO CAPS
145.0000 ug | ORAL_CAPSULE | Freq: Every day | ORAL | Status: DC
  Administered 2013-04-18 – 2013-04-20 (×3): 145 ug via ORAL
  Filled 2013-04-17 (×5): qty 1

## 2013-04-17 MED ORDER — NITROGLYCERIN 0.4 MG SL SUBL: 0.4000 mg | SUBLINGUAL_TABLET | SUBLINGUAL | Status: DC | PRN

## 2013-04-17 MED ORDER — METHYLPREDNISOLONE SODIUM SUCC 125 MG IJ SOLR
125.0000 mg | Freq: Once | INTRAMUSCULAR | Status: AC
  Administered 2013-04-17: 125 mg via INTRAVENOUS
  Filled 2013-04-17: qty 2

## 2013-04-17 MED ORDER — ONDANSETRON HCL 4 MG PO TABS: 4.0000 mg | ORAL_TABLET | Freq: Four times a day (QID) | ORAL | Status: DC | PRN

## 2013-04-17 MED ORDER — ONDANSETRON HCL 4 MG/2ML IJ SOLN: 4.0000 mg | Freq: Four times a day (QID) | INTRAMUSCULAR | Status: DC | PRN

## 2013-04-17 MED ORDER — SIMVASTATIN 20 MG PO TABS
20.0000 mg | ORAL_TABLET | Freq: Every day | ORAL | Status: DC
  Administered 2013-04-18 – 2013-04-19 (×2): 20 mg via ORAL
  Filled 2013-04-17 (×2): qty 1

## 2013-04-17 MED ORDER — GUAIFENESIN ER 600 MG PO TB12
600.0000 mg | ORAL_TABLET | Freq: Two times a day (BID) | ORAL | Status: DC
  Administered 2013-04-18 – 2013-04-20 (×6): 600 mg via ORAL
  Filled 2013-04-17 (×6): qty 1

## 2013-04-17 MED ORDER — PREDNISONE 20 MG PO TABS
40.0000 mg | ORAL_TABLET | Freq: Every day | ORAL | Status: DC
  Administered 2013-04-18: 40 mg via ORAL
  Filled 2013-04-17: qty 2

## 2013-04-17 MED ORDER — FUROSEMIDE 10 MG/ML IJ SOLN
60.0000 mg | Freq: Once | INTRAMUSCULAR | Status: AC
Start: 2013-04-17 — End: 2013-04-17
  Administered 2013-04-17: 60 mg via INTRAVENOUS
  Filled 2013-04-17: qty 6

## 2013-04-17 MED ORDER — OXYBUTYNIN CHLORIDE 5 MG PO TABS
5.0000 mg | ORAL_TABLET | Freq: Two times a day (BID) | ORAL | Status: DC
  Administered 2013-04-18 – 2013-04-20 (×6): 5 mg via ORAL
  Filled 2013-04-17 (×6): qty 1

## 2013-04-17 MED ORDER — SODIUM CHLORIDE 0.9 % IJ SOLN
3.0000 mL | Freq: Two times a day (BID) | INTRAMUSCULAR | Status: DC
  Administered 2013-04-19: 3 mL via INTRAVENOUS

## 2013-04-17 MED ORDER — ALBUTEROL SULFATE (2.5 MG/3ML) 0.083% IN NEBU
2.5000 mg | INHALATION_SOLUTION | Freq: Four times a day (QID) | RESPIRATORY_TRACT | Status: DC
  Administered 2013-04-18 – 2013-04-20 (×8): 2.5 mg via RESPIRATORY_TRACT
  Filled 2013-04-17 (×8): qty 3

## 2013-04-17 MED ORDER — ALBUTEROL SULFATE (2.5 MG/3ML) 0.083% IN NEBU
2.5000 mg | INHALATION_SOLUTION | Freq: Once | RESPIRATORY_TRACT | Status: AC
  Administered 2013-04-17: 2.5 mg via RESPIRATORY_TRACT
  Filled 2013-04-17: qty 3

## 2013-04-17 NOTE — H&P (Signed)
[facility] History and Physical  [name] OZH:[id number] DOB: [birth year] DOA: 04/17/2013  Referring physician: Dr. Zenia Resides PCP: Asencion Noble, MD   Chief Complaint:  productive cough with shortness of breath for 2 days  HPI:  80 year old male with history of stroke, hypertension, hyperlipidemia, CAD, seizure disorder, GERD, gold stage A  COPD on nocturnal oxygen, history of asthma, AAA, recurrent syncope the last 2 months presented to the ED with increased shortness of breath with productive cough for past days. Patient has been having coughing with brownish phlegm for past 2 days and associated increased shortness of breath with minimal exertion. He has been requiring oxygen via nasal cannula throughout the day. Denies any fevers, chills, orthopnea, PND,  headache, dizziness,nausea , vomiting, chest pain, palpitations, SOB, abdominal pain, bowel or urinary symptoms. Reports poor appetite. Patient has been having short spells of syncopal episodes for past 2-3 months with almost 4 episodes in the last 1 month. The symptoms upper usually when he is upright and last for several seconds. He reports having warning symptoms in the form of dizziness. Denies any head injury. He was seen by cardiology 3 days back and had order for 2-D echo and outpatient Holter monitoring for 15 days. He started using the Holter monitoring yesterday. Patient denies any sick contacts or any recent travel.  Course in the ED Patient's vitals were stable. He was afebrile. His O2 sat however was 89% on room air and improved to 96% on 2 L. Blood work done and showed mild anemia and thrombocytopenia with platelets of 111. Chemistry was unremarkable except for creatinine of 2.12 which is at his baseline. Blood glucose was 145. ProBNP was elevated close to 4000. A chest x-ray done showed multilobar pneumonitis with small bilateral effusions right >left. Patient given a dose of IV Lasix 60 mg, one dose of albuterol neb, one dose  of DuoNeb and 125 mg IV Solu-Medrol and [facility] called for admission to telemetry.  Review of Systems:  Constitutional: Denies fever, chills, diaphoresis, appetite change and fatigue.  HEENT: Denies photophobia, eye pain, redness, hearing loss, ear pain, congestion, sore throat, rhinorrhea, sneezing, mouth sores, trouble swallowing, neck pain,   Respiratory: SOB, DOE, cough, denies chest tightness,  and wheezing.   Cardiovascular: Denies chest pain, palpitations, leg swelling.  Gastrointestinal: Denies nausea, vomiting, abdominal pain, diarrhea, constipation, blood in stool and abdominal distention.  Genitourinary: Denies dysuria, urgency, frequency, hematuria, flank pain and difficulty urinating.  Endocrine: Denies:hot or cold intolerance  polyuria, polydipsia. Musculoskeletal: Denies myalgias, back pain, joint swelling, arthralgias and gait problem.  Skin: Denies pallor, rash and wound.  Neurological: Syncope, weakness, Denies dizziness, seizures,  weakness, light-headedness, numbness and headaches.  Psychiatric/Behavioral: Denies confusion,  sleep disturbance and agitation   Past Medical History  Diagnosis Date  . Gout   . Stroke 1993, 1995    chronic balance issues  . Hyperlipidemia   . Emphysema   . Hypertension   . CAD (coronary artery disease)     stent 2006  . Seizure disorder   . GERD (gastroesophageal reflux disease)   . Cancer   . Asthma   . Syncope Jan. 1, 2014    Syncope  . AAA (abdominal aortic aneurysm)    Past Surgical History  Procedure Laterality Date  . Cardiac catheterization  08/23/2004    bare metal stenting of the proximal circumflex  . Colonoscopy      X 2  . Colonoscopy  11/01/2011    Procedure: COLONOSCOPY;  Surgeon: Bernadene Person  Gloriann Loan, MD;  Location: [facility];  Service: Endoscopy;  Laterality: N/A;  100  . Eye surgery    . Nose surgery     Social History:  reports that he quit smoking about 23 years ago. His smoking use included  Cigarettes. He has a 15 pack-year smoking history. He has never used smokeless tobacco. He reports that he does not drink alcohol or use illicit drugs.  Allergies  Allergen Reactions  . Penicillins Itching    Family History  Problem Relation Age of Onset  . Emphysema Brother   . Heart disease Sister   . Cancer Brother     lung  . Cancer Brother     throat  . Rheum arthritis Mother   . Arthritis Mother   . Rheum arthritis Father   . Colon cancer Neg Hx   . Diabetes      Prior to Admission medications   Medication Sig Start Date End Date Taking? Authorizing Provider  allopurinol (ZYLOPRIM) 300 MG tablet Take 300 mg by mouth every morning.    Yes Historical Provider, MD  clopidogrel (PLAVIX) 75 MG tablet Take 75 mg by mouth every morning.    Yes Historical Provider, MD  donepezil (ARICEPT) 10 MG tablet Take 10 mg by mouth at bedtime.    Yes Historical Provider, MD  famotidine (PEPCID) 20 MG tablet Take 1 tablet (20 mg total) by mouth 2 (two) times daily. 10/29/12  Yes Elsie Stain, MD  Linaclotide [facility]) 145 MCG CAPS capsule Take 1 capsule (145 mcg total) by mouth daily. 03/05/13  Yes Butch Penny, NP  losartan (COZAAR) 100 MG tablet Take 100 mg by mouth daily.   Yes Historical Provider, MD  metoprolol tartrate (LOPRESSOR) 25 MG tablet Take 1 tablet (25 mg total) by mouth every morning. 07/18/12  Yes Maricela Curet, MD  nitroGLYCERIN (NITROSTAT) 0.4 MG SL tablet Place 1 tablet (0.4 mg total) under the tongue every 5 (five) minutes as needed for chest pain. 1 tablet under tongue at onset of chest pain;you may repeat every 5 minutes for up to 3 doses. 10/03/12  Yes Herminio Commons, MD  Oxcarbazepine (TRILEPTAL) 300 MG tablet Take 600 mg by mouth 2 (two) times daily. total 1200 mg daily   Yes Historical Provider, MD  oxybutynin (DITROPAN) 5 MG tablet Take 5 mg by mouth 2 (two) times daily.    Yes Historical Provider, MD  pravastatin (PRAVACHOL) 40 MG tablet Take 40 mg by  mouth daily. Daily at bedtime   Yes Historical Provider, MD  Tamsulosin HCl (FLOMAX) 0.4 MG CAPS Take 0.4 mg by mouth at bedtime.    Yes Historical Provider, MD  tiotropium (SPIRIVA HANDIHALER) 18 MCG inhalation capsule Place 1 capsule (18 mcg total) into inhaler and inhale daily. 03/06/13  Yes Elsie Stain, MD  VITAMIN B1-B12 IM Inject into the muscle every 30 (thirty) days. Once a month   Yes Historical Provider, MD     Physical Exam:  Filed Vitals:   04/17/13 1956 04/17/13 1959 04/17/13 2047 04/17/13 2121  BP:  139/69  141/61  Pulse:  88  87  Temp:  97.6 F (36.4 C)  97 F (36.1 C)  TempSrc:  Oral  Rectal  Resp:  14  18  Height:  5\' 11"  (1.803 m)    Weight:  85.276 kg (188 lb)    SpO2: 89% 89% 96% 96%    Constitutional: Vital signs reviewed.  Patient is an elderly male in  no acute distress. HEENT: no pallor, no icterus, moist oral mucosa, no cervical lymphadenopathy, no JVD Cardiovascular: RRR, S1 normal, S2 normal, no MRG Chest: Diminished breath sounds bilaterally, few inspiratory crackles Abdominal: Soft. Non-tender, non-distended, bowel sounds are normal, no masses, organomegaly, or guarding present.  Ext: warm, trace bilateral edema Neurological: A&O x3, non focal  Labs on Admission:  Basic Metabolic Panel:  Recent Labs Lab 04/17/13 2008  NA 141  K 3.9  CL 102  CO2 27  GLUCOSE 145*  BUN 17  CREATININE 2.12*  CALCIUM 9.2   Liver Function Tests:  Recent Labs Lab 04/17/13 2008  AST 16  ALT 14  ALKPHOS 71  BILITOT 0.4  PROT 7.1  ALBUMIN 3.9   No results found for this basename: LIPASE, AMYLASE,  in the last 168 hours No results found for this basename: AMMONIA,  in the last 168 hours CBC:  Recent Labs Lab 04/17/13 2008  WBC 5.0  NEUTROABS 2.8  HGB 12.4*  HCT 36.4*  MCV 101.4*  PLT 111*   Cardiac Enzymes: No results found for this basename: CKTOTAL, CKMB, CKMBINDEX, TROPONINI,  in the last 168 hours BNP: No components found with this  basename: POCBNP,  CBG: No results found for this basename: GLUCAP,  in the last 168 hours  Radiological Exams on Admission: Dg Chest 2 View  04/17/2013   CLINICAL DATA:  Chest pain  EXAM: CHEST  2 VIEW  COMPARISON:  DG CHEST 2 VIEW dated 03/06/2012  FINDINGS: Low lung volumes. Cardiac silhouette moderate Lee enlarged. Areas of increased density appreciated within the mid and lower right hemi thorax at left lung base. Bilateral effusions are appreciated right greater than left. Osseous structures unremarkable. Atherosclerotic calcifications within the aorta. Joneen Caraway findings statistically consistent with a hiatal hernia in the retrocardiac region. There is also component of prominence of interstitial markings of mild peribronchial cuffing.  IMPRESSION: Multilobar pneumonitis. Small bilateral effusions right greater than left.  A component of pulmonary edema is also a diagnostic consideration.   Electronically Signed   By: Margaree Mackintosh M.D.   On: 04/17/2013 21:29    EKG:   Assessment/Plan Principal Problem:   CHF (congestive heart failure)   Active Problems:   Pneumonitis   HYPERLIPIDEMIA   HYPERTENSION   CORONARY ATHEROSCLEROSIS NATIVE CORONARY ARTERY   C O P D gold stage A   AAA (abdominal aortic aneurysm)   Syncope   Thrombocytopenia   CKD (chronic kidney disease) stage 3, GFR 30-59 ml/min   Hx of TIA (transient ischemic attack) and stroke   Acute CHF exacerbation -Admit to telemetry. I need to multilobar pneumonitis and bilateral small effusions on chest x-ray. Elevated proBNP. -Will start patient on IV Lasix 40 mg twice a day. Monitor daily weight and strict I/O -Check 2D echo. -continue ASA, statin and metoprolol.  Acute pneumonitis Possibly secondary to fluid overload versus underlying bronchitis. Patient received a dose of IV Solu-Medrol 125 mg in the ED. I will place him on oral prednisone 40 mg daily. Patient on scheduled and when necessary albuterol nebs. Continue  Mucinex. I will place him on empiric Levaquin. Follow blood culture and Sputum culture.  Hypertension Blood pressure stable. Continue losartan and metoprolol.  CAD Stable. Continue aspirin, Plavix, beta blocker and statin.  recurrent syncope No clear etiology. Possible cardiac. He has had almost 4 episodes of syncope in the past one month. MRI done in February regarding acute findings. Patient also has pseudoseizures and is on Trileptal. Recently seen by  cardiology 2 days back. Orthostasis done in the clinic was negative. He was sent out on 15 day outpatient holter monitoring which he had started since yesterday. We will monitor him on telemetry. Last episode of syncope was yesterday and was brief for a few seconds. She symptoms of syncope last for seconds and usually when he is upright. Check 2-D echo. Monitor on telemetry for now. If unremarkable will be discharged home to continue outpatient Holter monitoring. - PT eval.  Thrombocytopenia Monitor closely. Patient is on Pepcid which I will discontinue.  History of AAA Followup as outpatient  COPD gold stage A Patient on nocturnal O2 via nasal cannula however has been requiring throughout the day for last few days. Continue her prednisone and nebs. Continue home inhalers.  Chronic kidney disease stage III Stable. Continue to monitor  History of TIA/stroke Continue aspirin, Plavix and statin  Diet:cardiac  DVT prophylaxis: sq heparin. Monitor low platelets   Code Status:full code Family Communication: discussed with daughter at bedside Disposition Plan: Home once improved  Shiya Fogelman, [facility] Pager [phone]  Total time spent on admission :70 minutes  If 7PM-7AM, please contact night-coverage [email or web address] Password [email or web address] 04/17/2013, 10:09 PM

## 2013-04-17 NOTE — ED Notes (Signed)
PT HAS BEEN SOB FOR AWHILE BUT THE LAST 2 DAYS PT HAS GOTTEN WORSE AND HAS BEEN WEARING HIS O2 ALL DAY. HE IS NORMALLY ON 2L AT NIGHT. PT HAS PRODUCTIVE COUGH

## 2013-04-17 NOTE — ED Provider Notes (Signed)
CSN: [id number]     Arrival date & time 04/17/13  1944 History  This chart was scribed for Leota Jacobsen, MD by Zettie Pho, ED Scribe. This patient was seen in room [facility] and the patient's care was started at 8:03 PM.    Chief Complaint  Patient presents with  . Shortness of Breath   The history is provided by the patient and a relative (daughter, daughter-in-law). No language interpreter was used.   HPI Comments: [name] is a 80 y.o. Male with a history of asthma emphysema (patient wears 2 L of oxygen at night) who presents to the Emergency Department complaining of shortness of breath with an associated productive cough with brown-colored sputum that he states has been progressively worsening over the past 2 days. Patient reports that he has had to wear is oxygen at home all day, but without significant relief. He states that his symptoms are exacerbated with exertion. Patient reports some associated leg swelling that he states is normal for him, but appears slightly worse than usual. His daughter reports an associated, tactile fever 2 days ago (patient is afebrile at 97.6 in the ED). He denies emesis, diarrhea. His daughter-in-law reports that the patient was seen by his cardiologist 3 days ago. Patient also has a history of stroke, hyperlipidemia, HTN, CAD, cancer, and abdominal aortic aneurysm.   Past Medical History  Diagnosis Date  . Gout   . Stroke 1993, 1995    chronic balance issues  . Hyperlipidemia   . Emphysema   . Hypertension   . CAD (coronary artery disease)     stent 2006  . Seizure disorder   . GERD (gastroesophageal reflux disease)   . Cancer   . Asthma   . Syncope Jan. 1, 2014    Syncope  . AAA (abdominal aortic aneurysm)    Past Surgical History  Procedure Laterality Date  . Cardiac catheterization  08/23/2004    bare metal stenting of the proximal circumflex  . Colonoscopy      X 2  . Colonoscopy  11/01/2011    Procedure: COLONOSCOPY;  Surgeon:  Rogene Houston, MD;  Location: [facility];  Service: Endoscopy;  Laterality: N/A;  100  . Eye surgery    . Nose surgery     Family History  Problem Relation Age of Onset  . Emphysema Brother   . Heart disease Sister   . Cancer Brother     lung  . Cancer Brother     throat  . Rheum arthritis Mother   . Arthritis Mother   . Rheum arthritis Father   . Colon cancer Neg Hx   . Diabetes     History  Substance Use Topics  . Smoking status: Former Smoker -- 0.30 packs/day for 50 years    Types: Cigarettes    Quit date: 01/15/1990  . Smokeless tobacco: Never Used  . Alcohol Use: No    Review of Systems  Constitutional: Positive for fever (subjective, resolved).  Respiratory: Positive for cough and shortness of breath.   Cardiovascular: Positive for leg swelling.  Gastrointestinal: Negative for vomiting and diarrhea.  All other systems reviewed and are negative.      Allergies  Penicillins  Home Medications   Current Outpatient Rx  Name  Route  Sig  Dispense  Refill  . allopurinol (ZYLOPRIM) 300 MG tablet   Oral   Take 300 mg by mouth every morning.          Marland Kitchen  clopidogrel (PLAVIX) 75 MG tablet   Oral   Take 75 mg by mouth every morning.          . donepezil (ARICEPT) 10 MG tablet   Oral   Take 10 mg by mouth at bedtime.          . famotidine (PEPCID) 20 MG tablet   Oral   Take 1 tablet (20 mg total) by mouth 2 (two) times daily.   60 tablet   5   . Linaclotide (LINZESS) 145 MCG CAPS capsule   Oral   Take 1 capsule (145 mcg total) by mouth daily.   30 capsule   3   . losartan (COZAAR) 100 MG tablet   Oral   Take 100 mg by mouth daily.         . metoprolol tartrate (LOPRESSOR) 25 MG tablet   Oral   Take 1 tablet (25 mg total) by mouth every morning.   60 tablet   3   . nitroGLYCERIN (NITROSTAT) 0.4 MG SL tablet   Sublingual   Place 1 tablet (0.4 mg total) under the tongue every 5 (five) minutes as needed for chest pain. 1 tablet under  tongue at onset of chest pain;you may repeat every 5 minutes for up to 3 doses.   25 tablet   3   . Oxcarbazepine (TRILEPTAL) 300 MG tablet   Oral   Take 600 mg by mouth. 2 tabs am,2 tabs pm total 1200 mg daily         . oxybutynin (DITROPAN) 5 MG tablet   Oral   Take 5 mg by mouth 2 (two) times daily.          . pravastatin (PRAVACHOL) 40 MG tablet   Oral   Take 40 mg by mouth daily. Daily at bedtime         . sennosides-docusate sodium (SENOKOT-S) 8.6-50 MG tablet   Oral   Take 1 tablet by mouth 2 (two) times daily.         . Tamsulosin HCl (FLOMAX) 0.4 MG CAPS   Oral   Take 0.4 mg by mouth at bedtime.          Marland Kitchen tiotropium (SPIRIVA HANDIHALER) 18 MCG inhalation capsule   Inhalation   Place 1 capsule (18 mcg total) into inhaler and inhale daily.   30 capsule   6   . VITAMIN B1-B12 IM   Intramuscular   Inject into the muscle every 30 (thirty) days. Once a month          Triage Vitals: BP 139/69  Pulse 88  Temp(Src) 97.6 F (36.4 C) (Oral)  Resp 14  Ht 5\' 11"  (1.803 m)  Wt 188 lb (85.276 kg)  BMI 26.23 kg/m2  SpO2 89%  Physical Exam  Nursing note and vitals reviewed. Constitutional: He is oriented to person, place, and time. He appears well-developed and well-nourished.  Non-toxic appearance. No distress.  HENT:  Head: Normocephalic and atraumatic.  Eyes: Conjunctivae, EOM and lids are normal. Pupils are equal, round, and reactive to light.  Neck: Normal range of motion. Neck supple. No tracheal deviation present. No mass present.  Cardiovascular: Normal rate, regular rhythm and normal heart sounds.  Exam reveals no gallop.   No murmur heard. Pulmonary/Chest: Effort normal. No stridor. No respiratory distress. He has no decreased breath sounds. He has wheezes. He has no rhonchi. He has rales.  Increased and expiratory wheezing with scattered rales bilaterally.   Abdominal: Soft. Normal appearance  and bowel sounds are normal. He exhibits no  distension. There is no tenderness. There is no rebound and no CVA tenderness.  Musculoskeletal: Normal range of motion. He exhibits edema. He exhibits no tenderness.  2+ pitting edema bilaterally.   Neurological: He is alert and oriented to person, place, and time. He has normal strength. No cranial nerve deficit or sensory deficit. GCS eye subscore is 4. GCS verbal subscore is 5. GCS motor subscore is 6.  Skin: Skin is warm and dry. No abrasion and no rash noted.  Psychiatric: He has a normal mood and affect. His speech is normal and behavior is normal.    ED Course  Procedures (including critical care time)  DIAGNOSTIC STUDIES: Oxygen Saturation is 89% on room air, low by my interpretation.    COORDINATION OF CARE: 8:07 PM- Ordered a chest x-ray, blood culture, CBC, CMP, BNP, lactic acid, UA with culture. Will order a Duoneb and albuterol breathing treatment, IV fluids, and methylprednisolone to manage symptoms. Discussed treatment plan with patient at bedside and patient verbalized agreement.     Labs Review Labs Reviewed  CBC WITH DIFFERENTIAL - Abnormal; Notable for the following:    RBC 3.59 (*)    Hemoglobin 12.4 (*)    HCT 36.4 (*)    MCV 101.4 (*)    MCH 34.5 (*)    Platelets 111 (*)    All other components within normal limits  COMPREHENSIVE METABOLIC PANEL - Abnormal; Notable for the following:    Glucose, Bld 145 (*)    Creatinine, Ser 2.12 (*)    GFR calc non Af Amer 26 (*)    GFR calc Af Amer 30 (*)    All other components within normal limits  PRO B NATRIURETIC PEPTIDE - Abnormal; Notable for the following:    Pro B Natriuretic peptide (BNP) 3970.0 (*)    All other components within normal limits  CULTURE, BLOOD (ROUTINE X 2)  CULTURE, BLOOD (ROUTINE X 2)  URINE CULTURE  LACTIC ACID, PLASMA  URINALYSIS, ROUTINE W REFLEX MICROSCOPIC  I-STAT CG4 LACTIC ACID, ED   Imaging Review Dg Chest 2 View  04/17/2013   CLINICAL DATA:  Chest pain  EXAM: CHEST  2 VIEW   COMPARISON:  DG CHEST 2 VIEW dated 03/06/2012  FINDINGS: Low lung volumes. Cardiac silhouette moderate Lee enlarged. Areas of increased density appreciated within the mid and lower right hemi thorax at left lung base. Bilateral effusions are appreciated right greater than left. Osseous structures unremarkable. Atherosclerotic calcifications within the aorta. Joneen Caraway findings statistically consistent with a hiatal hernia in the retrocardiac region. There is also component of prominence of interstitial markings of mild peribronchial cuffing.  IMPRESSION: Multilobar pneumonitis. Small bilateral effusions right greater than left.  A component of pulmonary edema is also a diagnostic consideration.   Electronically Signed   By: Margaree Mackintosh M.D.   On: 04/17/2013 21:29     EKG Interpretation   Date/Time:  Friday April 17 2013 19:59:58 EDT Ventricular Rate:  86 PR Interval:  256 QRS Duration: 128 QT Interval:  396 QTC Calculation: 473 R Axis:   -44 Text Interpretation:  Sinus rhythm with 1st degree A-V block Left axis  deviation Non-specific intra-ventricular conduction block Cannot rule out  Septal infarct , age undetermined Possible Lateral infarct , age  undetermined Abnormal ECG When compared with ECG of 13-Mar-2013 13:33,  Borderline criteria for Lateral infarct are now Present Confirmed by Zenia Resides   MD, Byrl Latin ([id number]) on 04/17/2013 8:23:07 PM  MDM   Final diagnoses:  None   Pt given lasix and will be admitted for chf   I personally performed the services described in this documentation, which was scribed in my presence. The recorded information has been reviewed and is accurate.     Leota Jacobsen, MD 04/17/13 [phone]

## 2013-04-17 NOTE — ED Notes (Signed)
Family at bedside. Made patient aware that a urine specimen is needed. Patient states that he went before coming in and does not feel like he can go at this time.

## 2013-04-17 NOTE — Progress Notes (Signed)
ANTIBIOTIC CONSULT NOTE - INITIAL  Pharmacy Consult for Levaquin Indication: acute bronchitis  Allergies  Allergen Reactions  . Penicillins Itching    Patient Measurements: Height: 5\' 11"  (180.3 cm) Weight: 188 lb (85.276 kg) IBW/kg (Calculated) : 75.3 Adjusted Body Weight: 79 kg  Vital Signs: Temp: 97.1 F (36.2 C) (04/03 2323) Temp src: Oral (04/03 2323) BP: 165/76 mmHg (04/03 2323) Pulse Rate: 91 (04/03 2323) Intake/Output from previous day:   Intake/Output from this shift: Total I/O In: -  Out: 300 [Urine:300]  Labs:  Recent Labs  04/17/13 2008  WBC 5.0  HGB 12.4*  PLT 111*  CREATININE 2.12*   Estimated Creatinine Clearance: 25.2 ml/min (by C-G formula based on Cr of 2.12). No results found for this basename: VANCOTROUGH, VANCOPEAK, VANCORANDOM, GENTTROUGH, GENTPEAK, GENTRANDOM, TOBRATROUGH, TOBRAPEAK, TOBRARND, AMIKACINPEAK, AMIKACINTROU, AMIKACIN,  in the last 72 hours   Microbiology: No results found for this or any previous visit (from the past 720 hour(s)).  Medical History: Past Medical History  Diagnosis Date  . Gout   . Stroke 1993, 1995    chronic balance issues  . Hyperlipidemia   . Emphysema   . Hypertension   . CAD (coronary artery disease)     stent 2006  . Seizure disorder   . GERD (gastroesophageal reflux disease)   . Cancer   . Asthma   . Syncope Jan. 1, 2014    Syncope  . AAA (abdominal aortic aneurysm)     Medications:  Scheduled:  . [START ON 04/18/2013] albuterol  2.5 mg Nebulization Q6H  . [START ON 04/18/2013] allopurinol  300 mg Oral q morning - 10a  . [START ON 04/18/2013] clopidogrel  75 mg Oral q morning - 10a  . donepezil  10 mg Oral QHS  . [START ON 04/18/2013] furosemide  40 mg Intravenous BID  . guaiFENesin  600 mg Oral BID  . heparin  5,000 Units Subcutaneous 3 times per day  . [START ON 04/18/2013] levofloxacin (LEVAQUIN) IV  500 mg Intravenous Once   Followed by  . [START ON 04/18/2013] levofloxacin (LEVAQUIN) IV   250 mg Intravenous QHS  . [START ON 04/18/2013] Linaclotide  145 mcg Oral Daily  . [START ON 04/18/2013] losartan  100 mg Oral Daily  . [START ON 04/18/2013] metoprolol tartrate  25 mg Oral q morning - 10a  . Oxcarbazepine  600 mg Oral BID  . oxybutynin  5 mg Oral BID  . [START ON 04/18/2013] predniSONE  40 mg Oral Q breakfast  . [START ON 04/18/2013] simvastatin  20 mg Oral q1800  . sodium chloride  3 mL Intravenous Q12H  . tamsulosin  0.4 mg Oral QHS  . [START ON 04/18/2013] tiotropium  18 mcg Inhalation Daily   Infusions:  . sodium chloride 20 mL/hr at 04/17/13 2018   PRN: acetaminophen, acetaminophen, albuterol, nitroGLYCERIN, ondansetron (ZOFRAN) IV, ondansetron  Assessment: 46yr male with CrCl=42ml/min to be treated with Levaquin for acute bronchitis.  Goal of Therapy:  Standard regimen is 500mg  Levaquin daily for 5-7 days.  With reduced CrCl, after intial 500mg  dose, further doses are reduce to 250mg  qd.  Plan:  1.  Levaquin 500mg  IV x 1 , then 250mg  q24h 2.  Monitor for indices of infection; monitor for renal function  Penni Homans 04/17/2013,11:57 PM

## 2013-04-18 DIAGNOSIS — I319 Disease of pericardium, unspecified: Secondary | ICD-10-CM

## 2013-04-18 LAB — CBC
HCT: 32.9 % — ABNORMAL LOW (ref 39.0–52.0)
HEMOGLOBIN: 11.3 g/dL — AB (ref 13.0–17.0)
MCH: 34.7 pg — AB (ref 26.0–34.0)
MCHC: 34.3 g/dL (ref 30.0–36.0)
MCV: 100.9 fL — ABNORMAL HIGH (ref 78.0–100.0)
PLATELETS: 116 10*3/uL — AB (ref 150–400)
RBC: 3.26 MIL/uL — ABNORMAL LOW (ref 4.22–5.81)
RDW: 14.7 % (ref 11.5–15.5)
WBC: 3.8 10*3/uL — ABNORMAL LOW (ref 4.0–10.5)

## 2013-04-18 LAB — BASIC METABOLIC PANEL
BUN: 19 mg/dL (ref 6–23)
CALCIUM: 8.8 mg/dL (ref 8.4–10.5)
CO2: 23 mEq/L (ref 19–32)
CREATININE: 2.17 mg/dL — AB (ref 0.50–1.35)
Chloride: 102 mEq/L (ref 96–112)
GFR, EST AFRICAN AMERICAN: 29 mL/min — AB (ref >90)
GFR, EST NON AFRICAN AMERICAN: 25 mL/min — AB (ref >90)
GLUCOSE: 208 mg/dL — AB (ref 70–99)
POTASSIUM: 4.1 meq/L (ref 3.7–5.3)
Sodium: 142 mEq/L (ref 137–147)

## 2013-04-18 MED ORDER — IPRATROPIUM-ALBUTEROL 0.5-2.5 (3) MG/3ML IN SOLN: 3.0000 mL | Freq: Four times a day (QID) | RESPIRATORY_TRACT | Status: DC | PRN

## 2013-04-18 MED ORDER — ALBUTEROL SULFATE (2.5 MG/3ML) 0.083% IN NEBU: 2.5000 mg | INHALATION_SOLUTION | Freq: Four times a day (QID) | RESPIRATORY_TRACT | Status: DC | PRN

## 2013-04-18 MED ORDER — OXCARBAZEPINE 300 MG PO TABS
ORAL_TABLET | ORAL | Status: AC
  Filled 2013-04-18: qty 2

## 2013-04-18 MED ORDER — LEVOFLOXACIN IN D5W 500 MG/100ML IV SOLN
INTRAVENOUS | Status: AC
  Filled 2013-04-18: qty 100

## 2013-04-18 NOTE — Progress Notes (Signed)
NAMEBLADE, SCHEFF NO.:  [id number]  MEDICAL RECORD NO.:  [id number]  LOCATION:  E703                          FACILITY:  [facility]  PHYSICIAN:  Paula Compton. Willey Blade, MD       DATE OF BIRTH:  1921-06-16  DATE OF PROCEDURE:  04/18/2013 DATE OF DISCHARGE:                                PROGRESS NOTE   SUBJECTIVE:  [name] was admitted yesterday for shortness of breath and cough.  Chest x-ray reveals multilobar pneumonia and possible pulmonary edema.  Has bilateral effusions, right greater than left.  Has been treated with IV Levaquin.  He has been treated with IV Lasix.  He describes coughing up dark sputum over the last few days.  He has had some swelling in his legs.  He denies orthopnea.  Had an echo in last summer revealing an ejection fraction of 60% to 65%.  He is felt to have grade 1 diastolic dysfunction.  He states he feels better this morning.  OBJECTIVE:  VITAL SIGNS:  Temperature 97.3, pulse 82, respirations 18, blood pressure 138/66, oxygen saturation 96% on nasal cannula O2 at 3 L/minute. LUNGS:  Basilar rales. HEART:  Regular with no murmurs. ABDOMEN:  Soft and nontender with no organomegaly. EXTREMITIES:  Trace edema in the left lower leg, none on the right.  IMPRESSION/PLAN: 1. Pneumonia.  Continue IV Levaquin.  White count was 5.0 yesterday     and is 3.8 today. 2. Possible diastolic heart failure.  ProBNP is 3970.  Continue IV     Lasix. 3. Chronic kidney disease.  Creatinine is stable at 2.17.  Potassium     is 4.1. 4. Thrombocytopenia, 111,000. 5. Normocytic anemia.  He has had a hemoglobin of 12.4 and 11.3. 6. Recurrent syncopal episodes.  Continue monitoring. 7. Abdominal aortic aneurysm, asymptomatic. 8. Seizure disorder.  Continue Trileptal. 9. Chronic obstructive pulmonary disease.  Continue Spiriva.     Paula Compton. Willey Blade, MD     ROF/MEDQ  D:  04/18/2013  T:  04/18/2013  Job:  [id number]

## 2013-04-18 NOTE — Progress Notes (Signed)
Utilization review Completed Symphonie Schneiderman RN BSN   

## 2013-04-19 LAB — BASIC METABOLIC PANEL
BUN: 27 mg/dL — AB (ref 6–23)
CALCIUM: 8.8 mg/dL (ref 8.4–10.5)
CHLORIDE: 102 meq/L (ref 96–112)
CO2: 29 mEq/L (ref 19–32)
CREATININE: 2.67 mg/dL — AB (ref 0.50–1.35)
GFR calc Af Amer: 23 mL/min — ABNORMAL LOW (ref >90)
GFR calc non Af Amer: 20 mL/min — ABNORMAL LOW (ref >90)
GLUCOSE: 130 mg/dL — AB (ref 70–99)
Potassium: 3.8 mEq/L (ref 3.7–5.3)
Sodium: 142 mEq/L (ref 137–147)

## 2013-04-19 LAB — URINE CULTURE
Colony Count: NO GROWTH
Culture: NO GROWTH

## 2013-04-19 MED ORDER — FUROSEMIDE 10 MG/ML IJ SOLN
40.0000 mg | Freq: Every day | INTRAMUSCULAR | Status: DC
  Administered 2013-04-20: 40 mg via INTRAVENOUS
  Filled 2013-04-19 (×2): qty 4

## 2013-04-19 NOTE — Progress Notes (Signed)
NAMEJOANNA, HALL NO.:  [id number]  MEDICAL RECORD NO.:  [id number]  LOCATION:  H846                          FACILITY:  [facility]  PHYSICIAN:  Paula Compton. Willey Blade, MD       DATE OF BIRTH:  [birth year]  DATE OF PROCEDURE:  04/19/2013 DATE OF DISCHARGE:                                PROGRESS NOTE   HISTORY:  [name] is feeling better today.  His cough has improved. He denies any sputum production this morning.  He had a sweat last night, but said the room was hot.  OBJECTIVE:  VITAL SIGNS:  He is afebrile now with a temperature 97.3, respirations 17, pulse 76, blood pressure 134/75, oxygen saturation is 97% on 3 L. LUNGS:  Basilar rales. HEART:  Regular with no murmurs. ABDOMEN:  Soft and nontender. EXTREMITIES:  No edema.  IMPRESSION AND PLAN: 1. Pneumonia.  Continue levofloxacin.  Continue nebulizer treatments. 2. Diastolic heart failure.  His repeat echo reveals normal left     ventricular ejection fraction with no wall motion abnormalities.     He is felt to have diastolic heart failure.  Doppler parameters     were consistent with restrictive physiology indicative of decreased     left ventricular diastolic compliance.  He is diuresed with IV     Lasix.  His BUN and creatinine have trended up to 27 and 2.67.     Lasix will be decreased to once daily. 3. Recurrent syncope.  No recurrence while on telemetry.     Paula Compton. Willey Blade, MD     ROF/MEDQ  D:  04/19/2013  T:  04/19/2013  Job:  [id number]

## 2013-04-20 ENCOUNTER — Telehealth: Payer: Self-pay | Admitting: Adult Health

## 2013-04-20 LAB — BASIC METABOLIC PANEL
BUN: 30 mg/dL — AB (ref 6–23)
CALCIUM: 8.1 mg/dL — AB (ref 8.4–10.5)
CO2: 28 mEq/L (ref 19–32)
CREATININE: 2.42 mg/dL — AB (ref 0.50–1.35)
Chloride: 105 mEq/L (ref 96–112)
GFR calc Af Amer: 26 mL/min — ABNORMAL LOW (ref >90)
GFR, EST NON AFRICAN AMERICAN: 22 mL/min — AB (ref >90)
Glucose, Bld: 112 mg/dL — ABNORMAL HIGH (ref 70–99)
Potassium: 3.5 mEq/L — ABNORMAL LOW (ref 3.7–5.3)
Sodium: 144 mEq/L (ref 137–147)

## 2013-04-20 MED ORDER — ALBUTEROL SULFATE (2.5 MG/3ML) 0.083% IN NEBU: 2.5000 mg | INHALATION_SOLUTION | Freq: Three times a day (TID) | RESPIRATORY_TRACT | Status: DC

## 2013-04-20 MED ORDER — POTASSIUM CHLORIDE CRYS ER 20 MEQ PO TBCR
40.0000 meq | EXTENDED_RELEASE_TABLET | Freq: Once | ORAL | Status: AC
  Administered 2013-04-20: 40 meq via ORAL
  Filled 2013-04-20: qty 2

## 2013-04-20 MED ORDER — LEVOFLOXACIN 250 MG PO TABS: 250.0000 mg | ORAL_TABLET | Freq: Every day | ORAL | Status: DC

## 2013-04-20 MED ORDER — FUROSEMIDE 40 MG PO TABS: 40.0000 mg | ORAL_TABLET | Freq: Every day | ORAL | Status: DC

## 2013-04-20 NOTE — Telephone Encounter (Signed)
Per K.lawrence NP note from last ov,pt needed echo (no contrast) which was done over the weekend as pt was seen in ED,daughter gGeraldine made aware

## 2013-04-20 NOTE — Progress Notes (Signed)
Patient discharged home with daughter.  IV removed - WNL.  Educated on Hf management at home. Patient verbalizes understanding of daily weights and low salt diet.  Instructed on new medications and emphasized importance of completing whole dose of antibiotics.  No questions at this time.  Stable to DC home, left floor via WC in NAD with RN.  PCP office to call to schedule follow up

## 2013-04-20 NOTE — Telephone Encounter (Signed)
Patient was scheduled for echo w/ contrast on 4/7.  Patient was admitted to hospital for CHF over weekend.  Regular echo was performed without contrast.  Patient's family wants to know if patient needs to come tomorrow for echo with contrast now. / tgs

## 2013-04-20 NOTE — Discharge Summary (Signed)
NAMEWOODFORD, STREGE NO.:  [id number]  MEDICAL RECORD NO.:  [id number]  LOCATION:  O160                          FACILITY:  [facility]  PHYSICIAN:  Paula Compton. Willey Blade, MD       DATE OF BIRTH:  [birth year]  DATE OF ADMISSION:  04/17/2013 DATE OF DISCHARGE:  LH                              DISCHARGE SUMMARY   DISCHARGE DIAGNOSES: 1. Pneumonia. 2. Acute diastolic heart failure. 3. Recurrent syncope. 4. Type 2 diabetes. 5. Chronic kidney disease. 6. Coronary artery disease. 7. Seizure disorder. 8. Chronic obstructive pulmonary disease. 9. Gout. 10.Hypertension. 11.Hyperlipidemia. 12.Gastroesophageal reflux disease. 13.Abdominal aortic aneurysm. 14.Thrombocytopenia. 15.Hypokalemia.  DISCHARGE MEDICATIONS:  Levaquin 250 mg daily for 7 days, Lasix 40 mg daily, allopurinol 300 mg daily, Plavix 75 mg daily, donepezil 10 mg daily, famotidine 20 mg b.i.d., Linzess 1 capsule daily 145 mcg, losartan 100 mg daily, metoprolol tartrate 25 mg daily, sublingual nitroglycerin p.r.n., Trileptal 600 mg b.i.d., Ditropan 5 mg b.i.d., pravastatin 40 mg daily, Flomax 0.4 mg daily, Spiriva 18 mcg capsule daily, vitamin B12 1000 mcg monthly.  HOSPITAL COURSE:  This patient is an 80 year old male who presented with cough, congestion, and shortness of breath.  His chest x-ray revealed multilobar pneumonia, and also possible pulmonary edema.  He was treated with IV antibiotic therapy with levofloxacin.  He was treated with nebulizer treatment and supplemental oxygen.  He was afebrile.  Blood cultures and urine culture are negative.  His respiratory status significantly improved and he was felt to be stable for discharge on the morning of the 6th.  He was felt to have acute diastolic heart failure, possibly contributing as well.  He was treated with IV Lasix.  Lower extremity edema resolved. He had an echocardiogram which revealed an ejection fraction of 60%.  He had no wall motion  abnormalities.  Lasix was decreased from twice a day to once a day, and is being switched to the oral route at discharge.  He had no recurrent syncope while hospitalized.  He had no seizure episodes.  His chronic kidney disease has been stable.  BUN and creatinine are 30 and 4.2 now, potassium is slightly low at 3.5 on the 6th and will be supplemented with 2 potassium supplements prior to discharge.  Blood counts were mildly low and will be followed up in the office.  His white count was 3.8, hemoglobin 11.3, and platelets of 116,000.  Diabetes was stable with a fasting glucose of 112.  He is on nondrug therapy.  As noted, he was improved and stable for discharge on the morning of the 6th.  He will have followup in my office in 1 week.  He will complete oral antibiotic therapy with levofloxacin.     Paula Compton. Willey Blade, MD     ROF/MEDQ  D:  04/20/2013  T:  04/20/2013  Job:  [id number]

## 2013-04-21 ENCOUNTER — Inpatient Hospital Stay (HOSPITAL_COMMUNITY): Admission: RE | Admit: 2013-04-21 | Payer: Medicare Other | Source: Ambulatory Visit

## 2013-04-22 LAB — CULTURE, BLOOD (ROUTINE X 2)
CULTURE: NO GROWTH
Culture: NO GROWTH

## 2013-04-28 ENCOUNTER — Telehealth: Payer: Self-pay

## 2013-04-28 ENCOUNTER — Inpatient Hospital Stay (HOSPITAL_COMMUNITY): Admission: AD | Admit: 2013-04-28 | Payer: Medicare Other | Source: Ambulatory Visit | Admitting: Internal Medicine

## 2013-04-28 ENCOUNTER — Other Ambulatory Visit: Payer: Self-pay

## 2013-04-28 ENCOUNTER — Inpatient Hospital Stay (HOSPITAL_COMMUNITY)
Admission: EM | Admit: 2013-04-28 | Discharge: 2013-04-30 | DRG: 244 | Disposition: A | Discharge status: Home / Self Care | Payer: Medicare Other | Attending: Interventional Cardiology | Admitting: Interventional Cardiology

## 2013-04-28 ENCOUNTER — Encounter: Payer: Self-pay | Admitting: Adult Health

## 2013-04-28 ENCOUNTER — Encounter (HOSPITAL_COMMUNITY): Payer: Self-pay | Admitting: Emergency Medicine

## 2013-04-28 DIAGNOSIS — I509 Heart failure, unspecified: Secondary | ICD-10-CM

## 2013-04-28 DIAGNOSIS — R600 Localized edema: Secondary | ICD-10-CM

## 2013-04-28 DIAGNOSIS — R079 Chest pain, unspecified: Secondary | ICD-10-CM

## 2013-04-28 DIAGNOSIS — G589 Mononeuropathy, unspecified: Secondary | ICD-10-CM | POA: Diagnosis present

## 2013-04-28 DIAGNOSIS — K5909 Other constipation: Secondary | ICD-10-CM

## 2013-04-28 DIAGNOSIS — E785 Hyperlipidemia, unspecified: Secondary | ICD-10-CM

## 2013-04-28 DIAGNOSIS — R55 Syncope and collapse: Secondary | ICD-10-CM

## 2013-04-28 DIAGNOSIS — G40909 Epilepsy, unspecified, not intractable, without status epilepticus: Secondary | ICD-10-CM | POA: Diagnosis present

## 2013-04-28 DIAGNOSIS — I498 Other specified cardiac arrhythmias: Secondary | ICD-10-CM

## 2013-04-28 DIAGNOSIS — D696 Thrombocytopenia, unspecified: Secondary | ICD-10-CM

## 2013-04-28 DIAGNOSIS — I447 Left bundle-branch block, unspecified: Secondary | ICD-10-CM

## 2013-04-28 DIAGNOSIS — Z48812 Encounter for surgical aftercare following surgery on the circulatory system: Secondary | ICD-10-CM

## 2013-04-28 DIAGNOSIS — Z8673 Personal history of transient ischemic attack (TIA), and cerebral infarction without residual deficits: Secondary | ICD-10-CM

## 2013-04-28 DIAGNOSIS — R001 Bradycardia, unspecified: Secondary | ICD-10-CM | POA: Diagnosis present

## 2013-04-28 DIAGNOSIS — M5126 Other intervertebral disc displacement, lumbar region: Secondary | ICD-10-CM

## 2013-04-28 DIAGNOSIS — Z880 Allergy status to penicillin: Secondary | ICD-10-CM

## 2013-04-28 DIAGNOSIS — J4489 Other specified chronic obstructive pulmonary disease: Secondary | ICD-10-CM

## 2013-04-28 DIAGNOSIS — I129 Hypertensive chronic kidney disease with stage 1 through stage 4 chronic kidney disease, or unspecified chronic kidney disease: Secondary | ICD-10-CM | POA: Diagnosis present

## 2013-04-28 DIAGNOSIS — K5900 Constipation, unspecified: Secondary | ICD-10-CM

## 2013-04-28 DIAGNOSIS — Z87891 Personal history of nicotine dependence: Secondary | ICD-10-CM

## 2013-04-28 DIAGNOSIS — Z9861 Coronary angioplasty status: Secondary | ICD-10-CM

## 2013-04-28 DIAGNOSIS — J310 Chronic rhinitis: Secondary | ICD-10-CM

## 2013-04-28 DIAGNOSIS — M109 Gout, unspecified: Secondary | ICD-10-CM

## 2013-04-28 DIAGNOSIS — I951 Orthostatic hypotension: Secondary | ICD-10-CM

## 2013-04-28 DIAGNOSIS — I6350 Cerebral infarction due to unspecified occlusion or stenosis of unspecified cerebral artery: Secondary | ICD-10-CM

## 2013-04-28 DIAGNOSIS — I10 Essential (primary) hypertension: Secondary | ICD-10-CM

## 2013-04-28 DIAGNOSIS — Z7902 Long term (current) use of antithrombotics/antiplatelets: Secondary | ICD-10-CM

## 2013-04-28 DIAGNOSIS — I714 Abdominal aortic aneurysm, without rupture, unspecified: Secondary | ICD-10-CM

## 2013-04-28 DIAGNOSIS — M51379 Other intervertebral disc degeneration, lumbosacral region without mention of lumbar back pain or lower extremity pain: Secondary | ICD-10-CM

## 2013-04-28 DIAGNOSIS — N184 Chronic kidney disease, stage 4 (severe): Secondary | ICD-10-CM | POA: Diagnosis present

## 2013-04-28 DIAGNOSIS — M5137 Other intervertebral disc degeneration, lumbosacral region: Secondary | ICD-10-CM

## 2013-04-28 DIAGNOSIS — J438 Other emphysema: Secondary | ICD-10-CM

## 2013-04-28 DIAGNOSIS — M79606 Pain in leg, unspecified: Secondary | ICD-10-CM

## 2013-04-28 DIAGNOSIS — N183 Chronic kidney disease, stage 3 unspecified: Secondary | ICD-10-CM

## 2013-04-28 DIAGNOSIS — K219 Gastro-esophageal reflux disease without esophagitis: Secondary | ICD-10-CM

## 2013-04-28 DIAGNOSIS — G459 Transient cerebral ischemic attack, unspecified: Secondary | ICD-10-CM

## 2013-04-28 DIAGNOSIS — R262 Difficulty in walking, not elsewhere classified: Secondary | ICD-10-CM

## 2013-04-28 DIAGNOSIS — I495 Sick sinus syndrome: Secondary | ICD-10-CM

## 2013-04-28 DIAGNOSIS — N289 Disorder of kidney and ureter, unspecified: Secondary | ICD-10-CM | POA: Diagnosis present

## 2013-04-28 DIAGNOSIS — R269 Unspecified abnormalities of gait and mobility: Secondary | ICD-10-CM

## 2013-04-28 DIAGNOSIS — I2510 Atherosclerotic heart disease of native coronary artery without angina pectoris: Secondary | ICD-10-CM

## 2013-04-28 DIAGNOSIS — J449 Chronic obstructive pulmonary disease, unspecified: Secondary | ICD-10-CM | POA: Diagnosis present

## 2013-04-28 DIAGNOSIS — I455 Other specified heart block: Secondary | ICD-10-CM

## 2013-04-28 DIAGNOSIS — J984 Other disorders of lung: Secondary | ICD-10-CM

## 2013-04-28 DIAGNOSIS — G629 Polyneuropathy, unspecified: Secondary | ICD-10-CM | POA: Diagnosis present

## 2013-04-28 DIAGNOSIS — J189 Pneumonia, unspecified organism: Secondary | ICD-10-CM

## 2013-04-28 HISTORY — DX: Sick sinus syndrome: I49.5

## 2013-04-28 LAB — COMPREHENSIVE METABOLIC PANEL
ALT: 12 U/L (ref 0–53)
AST: 15 U/L (ref 0–37)
Albumin: 3.8 g/dL (ref 3.5–5.2)
Alkaline Phosphatase: 61 U/L (ref 39–117)
BUN: 26 mg/dL — ABNORMAL HIGH (ref 6–23)
CO2: 31 mEq/L (ref 19–32)
Calcium: 9.1 mg/dL (ref 8.4–10.5)
Chloride: 97 mEq/L (ref 96–112)
Creatinine, Ser: 2.72 mg/dL — ABNORMAL HIGH (ref 0.50–1.35)
GFR calc Af Amer: 22 mL/min — ABNORMAL LOW (ref >90)
GFR calc non Af Amer: 19 mL/min — ABNORMAL LOW (ref >90)
Glucose, Bld: 114 mg/dL — ABNORMAL HIGH (ref 70–99)
Potassium: 4.4 mEq/L (ref 3.7–5.3)
Sodium: 140 mEq/L (ref 137–147)
Total Bilirubin: 0.4 mg/dL (ref 0.3–1.2)
Total Protein: 6.4 g/dL (ref 6.0–8.3)

## 2013-04-28 LAB — BASIC METABOLIC PANEL
BUN: 27 mg/dL — ABNORMAL HIGH (ref 6–23)
CALCIUM: 9.2 mg/dL (ref 8.4–10.5)
CO2: 30 mEq/L (ref 19–32)
CREATININE: 2.85 mg/dL — AB (ref 0.50–1.35)
Chloride: 98 mEq/L (ref 96–112)
GFR calc Af Amer: 21 mL/min — ABNORMAL LOW (ref >90)
GFR calc non Af Amer: 18 mL/min — ABNORMAL LOW (ref >90)
GLUCOSE: 147 mg/dL — AB (ref 70–99)
Potassium: 5.2 mEq/L (ref 3.7–5.3)
SODIUM: 139 meq/L (ref 137–147)

## 2013-04-28 LAB — CBC
HCT: 33.5 % — ABNORMAL LOW (ref 39.0–52.0)
Hemoglobin: 11.8 g/dL — ABNORMAL LOW (ref 13.0–17.0)
MCH: 35 pg — AB (ref 26.0–34.0)
MCHC: 35.2 g/dL (ref 30.0–36.0)
MCV: 99.4 fL (ref 78.0–100.0)
PLATELETS: 96 10*3/uL — AB (ref 150–400)
RBC: 3.37 MIL/uL — ABNORMAL LOW (ref 4.22–5.81)
RDW: 14 % (ref 11.5–15.5)
WBC: 5.4 10*3/uL (ref 4.0–10.5)

## 2013-04-28 LAB — PROTIME-INR
INR: 0.96 (ref 0.00–1.49)
Prothrombin Time: 12.6 seconds (ref 11.6–15.2)

## 2013-04-28 LAB — APTT: aPTT: 41 seconds — ABNORMAL HIGH (ref 24–37)

## 2013-04-28 LAB — I-STAT TROPONIN, ED: TROPONIN I, POC: 0.1 ng/mL — AB (ref 0.00–0.08)

## 2013-04-28 LAB — TSH: TSH: 1.74 u[IU]/mL (ref 0.350–4.500)

## 2013-04-28 LAB — TROPONIN I: Troponin I: <0.3 ng/mL (ref <0.30)

## 2013-04-28 LAB — MAGNESIUM: Magnesium: 2.1 mg/dL (ref 1.5–2.5)

## 2013-04-28 LAB — MRSA PCR SCREENING: MRSA by PCR: NEGATIVE

## 2013-04-28 MED ORDER — TAMSULOSIN HCL 0.4 MG PO CAPS
0.4000 mg | ORAL_CAPSULE | Freq: Every day | ORAL | Status: DC
  Administered 2013-04-28 – 2013-04-29 (×2): 0.4 mg via ORAL
  Filled 2013-04-28 (×4): qty 1

## 2013-04-28 MED ORDER — CLOPIDOGREL BISULFATE 75 MG PO TABS
75.0000 mg | ORAL_TABLET | Freq: Every morning | ORAL | Status: DC
  Administered 2013-04-29 – 2013-04-30 (×2): 75 mg via ORAL
  Filled 2013-04-28 (×2): qty 1

## 2013-04-28 MED ORDER — FAMOTIDINE 20 MG PO TABS
20.0000 mg | ORAL_TABLET | Freq: Two times a day (BID) | ORAL | Status: DC
  Administered 2013-04-28: 20 mg via ORAL
  Filled 2013-04-28 (×3): qty 1

## 2013-04-28 MED ORDER — FUROSEMIDE 40 MG PO TABS
40.0000 mg | ORAL_TABLET | Freq: Every day | ORAL | Status: DC
  Filled 2013-04-28: qty 1

## 2013-04-28 MED ORDER — OXYBUTYNIN CHLORIDE 5 MG PO TABS
5.0000 mg | ORAL_TABLET | Freq: Two times a day (BID) | ORAL | Status: DC
  Administered 2013-04-28 – 2013-04-30 (×4): 5 mg via ORAL
  Filled 2013-04-28 (×6): qty 1

## 2013-04-28 MED ORDER — DONEPEZIL HCL 10 MG PO TABS
10.0000 mg | ORAL_TABLET | Freq: Every day | ORAL | Status: DC
  Administered 2013-04-28 – 2013-04-29 (×2): 10 mg via ORAL
  Filled 2013-04-28 (×4): qty 1

## 2013-04-28 MED ORDER — ASPIRIN EC 81 MG PO TBEC
81.0000 mg | DELAYED_RELEASE_TABLET | Freq: Every day | ORAL | Status: DC
  Administered 2013-04-29: 81 mg via ORAL
  Filled 2013-04-28: qty 1

## 2013-04-28 MED ORDER — ALLOPURINOL 300 MG PO TABS
300.0000 mg | ORAL_TABLET | Freq: Every morning | ORAL | Status: DC
  Filled 2013-04-28: qty 1

## 2013-04-28 MED ORDER — NITROGLYCERIN 0.4 MG SL SUBL: 0.4000 mg | SUBLINGUAL_TABLET | SUBLINGUAL | Status: DC | PRN

## 2013-04-28 MED ORDER — HEPARIN BOLUS VIA INFUSION
4000.0000 [IU] | Freq: Once | INTRAVENOUS | Status: AC
  Administered 2013-04-28: 4000 [IU] via INTRAVENOUS
  Filled 2013-04-28: qty 4000

## 2013-04-28 MED ORDER — OXCARBAZEPINE 300 MG PO TABS
600.0000 mg | ORAL_TABLET | Freq: Two times a day (BID) | ORAL | Status: DC
  Administered 2013-04-28 – 2013-04-30 (×4): 600 mg via ORAL
  Filled 2013-04-28 (×6): qty 2

## 2013-04-28 MED ORDER — ACETAMINOPHEN 325 MG PO TABS: 650.0000 mg | ORAL_TABLET | ORAL | Status: DC | PRN

## 2013-04-28 MED ORDER — HEPARIN (PORCINE) IN NACL 100-0.45 UNIT/ML-% IJ SOLN
1300.0000 [IU]/h | INTRAMUSCULAR | Status: DC
  Administered 2013-04-28: 1000 [IU]/h via INTRAVENOUS
  Filled 2013-04-28: qty 250

## 2013-04-28 MED ORDER — ONDANSETRON HCL 4 MG/2ML IJ SOLN: 4.0000 mg | Freq: Four times a day (QID) | INTRAMUSCULAR | Status: DC | PRN

## 2013-04-28 MED ORDER — TIOTROPIUM BROMIDE MONOHYDRATE 18 MCG IN CAPS
18.0000 ug | ORAL_CAPSULE | Freq: Every day | RESPIRATORY_TRACT | Status: DC
  Administered 2013-04-29 – 2013-04-30 (×2): 18 ug via RESPIRATORY_TRACT
  Filled 2013-04-28: qty 5

## 2013-04-28 MED ORDER — SIMVASTATIN 5 MG PO TABS
5.0000 mg | ORAL_TABLET | Freq: Every day | ORAL | Status: DC
  Administered 2013-04-28 – 2013-04-29 (×2): 5 mg via ORAL
  Filled 2013-04-28 (×4): qty 1

## 2013-04-28 NOTE — Telephone Encounter (Signed)
"  AJ" from [employer] called at 1210 hrs to report patient had a nine second pause,followed by a six second pause,followed by a three second pause on cardiac monitoring which occurred at 1040 hrs today.They have be unable to reach patient and I tried home and phone rang without answering machine.I will attempt to reach daughter  Awaiting strip via fax,unable to view in [employer] system yet  1234 OTR:[id number] rhythm from [employer],daughter at pt's home  K.Lawrence NP aware

## 2013-04-28 NOTE — ED Notes (Signed)
Patient bed request changed to step down, will inform prior floor that patient is not coming.

## 2013-04-28 NOTE — H&P (Signed)
Patient ID: [name] MRN: [id number], DOB/AGE: May 19, 1921   Admit date: 04/28/2013   Primary Physician: Asencion Noble, MD Primary Cardiologist: Dr Harl Bowie  HPI:  Pleasant 80 y/o from [place]. 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 He was seen by Dr Wilhemina Cash in Aug 2006. Cath then showed an occluded RCA and 99% CFX. He underwent CFX BMS then. In Dec 2010 he was cathed and had occluded his CFX but had Lt-Lt collaterals, He did have residual 50-60% LAD then. He has CRI and an AAA- 4.67cm X 4.4cm July 2014. He was recently referred to Dr Harl Bowie to be established after he had a syncopal spell, see Curt Bears Lawrence's note from 04/14/13. He was set up to have an OP monitor placed but he was admitted to the hospital in [place] with CAP 04/17/13. After discharge he replaced his monitor and today he had several pauses on the monitor- the longest being 9 seconds. He was symptomatic- "felt like I was going out". He was contacted and is seen now in the ER. He initially denied chest pain but did admit to me that he had "bad indigestion" last night. He denies any jaw or arm pain. He has chronic DOE which has been worse since his CAP admission.    Problem List: Past Medical History  Diagnosis Date  . Gout   . Stroke 1993, 1995    chronic balance issues  . Hyperlipidemia   . Emphysema   . Hypertension   . CAD (coronary artery disease)     stent 2006  . Seizure disorder   . GERD (gastroesophageal reflux disease)   . Cancer   . Asthma   . Syncope Jan. 1, 2014    Syncope  . AAA (abdominal aortic aneurysm)     Past Surgical History  Procedure Laterality Date  . Cardiac catheterization  08/23/2004    bare metal stenting of the proximal circumflex  . Colonoscopy      X 2  . Colonoscopy  11/01/2011    Procedure: COLONOSCOPY;  Surgeon: Rogene Houston, MD;  Location: [facility];  Service: Endoscopy;  Laterality: N/A;  100  . Eye surgery    . Nose surgery       Allergies:  Allergies    Allergen Reactions  . Penicillins Itching     Home Medications No current facility-administered medications for this encounter.   Current Outpatient Prescriptions  Medication Sig Dispense Refill  . allopurinol (ZYLOPRIM) 300 MG tablet Take 300 mg by mouth every morning.       . clopidogrel (PLAVIX) 75 MG tablet Take 75 mg by mouth every morning.       . donepezil (ARICEPT) 10 MG tablet Take 10 mg by mouth at bedtime.       . famotidine (PEPCID) 20 MG tablet Take 1 tablet (20 mg total) by mouth 2 (two) times daily.  60 tablet  5  . furosemide (LASIX) 40 MG tablet Take 1 tablet (40 mg total) by mouth daily.  30 tablet  12  . levofloxacin (LEVAQUIN) 250 MG tablet Take 1 tablet (250 mg total) by mouth daily.  7 tablet  0  . Linaclotide (LINZESS) 145 MCG CAPS capsule Take 1 capsule (145 mcg total) by mouth daily.  30 capsule  3  . losartan (COZAAR) 100 MG tablet Take 100 mg by mouth daily.      . metoprolol tartrate (LOPRESSOR) 25 MG tablet Take 1 tablet (25 mg total) by mouth  every morning.  60 tablet  3  . nitroGLYCERIN (NITROSTAT) 0.4 MG SL tablet Place 1 tablet (0.4 mg total) under the tongue every 5 (five) minutes as needed for chest pain. 1 tablet under tongue at onset of chest pain;you may repeat every 5 minutes for up to 3 doses.  25 tablet  3  . Oxcarbazepine (TRILEPTAL) 300 MG tablet Take 600 mg by mouth 2 (two) times daily. total 1200 mg daily      . oxybutynin (DITROPAN) 5 MG tablet Take 5 mg by mouth 2 (two) times daily.       . pravastatin (PRAVACHOL) 40 MG tablet Take 40 mg by mouth daily. Daily at bedtime      . Tamsulosin HCl (FLOMAX) 0.4 MG CAPS Take 0.4 mg by mouth at bedtime.       Marland Kitchen tiotropium (SPIRIVA HANDIHALER) 18 MCG inhalation capsule Place 1 capsule (18 mcg total) into inhaler and inhale daily.  30 capsule  6  . VITAMIN B1-B12 IM Inject into the muscle every 30 (thirty) days. Once a month         Family History  Problem Relation Age of Onset  . Emphysema Brother    . Heart disease Sister   . Cancer Brother     lung  . Cancer Brother     throat  . Rheum arthritis Mother   . Arthritis Mother   . Rheum arthritis Father   . Colon cancer Neg Hx   . Diabetes       History   Social History  . Marital Status: Widowed    Spouse Name: N/A    Number of Children: 2  . Years of Education: N/A   Occupational History  . Retired Multimedia programmer   Social History Main Topics  . Smoking status: Former Smoker -- 0.30 packs/day for 50 years    Types: Cigarettes    Quit date: 01/15/1990  . Smokeless tobacco: Never Used  . Alcohol Use: No  . Drug Use: No  . Sexual Activity: Not on file   Other Topics Concern  . Not on file   Social History Narrative  . No narrative on file     Review of Systems: General: negative for chills, fever, night sweats or weight changes.  Cardiovascular: negative for chest pain, dyspnea on exertion, edema, orthopnea, palpitations, paroxysmal nocturnal dyspnea or shortness of breath Dermatological: negative for rash Respiratory: negative for cough or wheezing Urologic: negative for hematuria Abdominal: negative for nausea, vomiting, diarrhea, bright red blood per rectum, melena, or hematemesis Neurologic: negative for visual changes, syncope, or dizziness All other systems reviewed and are otherwise negative except as noted above.  Physical Exam: Blood pressure 128/66, pulse 66, temperature 97.6 F (36.4 C), temperature source Oral, resp. rate 14, height 5' 11.5" (1.816 m), weight 176 lb (79.833 kg), SpO2 97.00%.  General appearance: alert, cooperative, appears stated age, no distress and a little HOH Neck: no carotid bruit and no JVD Lungs: clear to auscultation bilaterally, few crackles Lt base Heart: regular rate and rhythm Abdomen: soft, non-tender; bowel sounds normal; no masses,  no organomegaly Extremities: extremities normal, atraumatic, no cyanosis or edema Pulses: 2+ and symmetric Rt FA  bruit Skin: pale, cool, dry Neurologic: Grossly normal    Labs:   Results for orders placed during the hospital encounter of 04/28/13 (from the past 24 hour(s))  CBC     Status: Abnormal   Collection Time    04/28/13  3:50  PM      Result Value Ref Range   WBC 5.4  4.0 - 10.5 K/uL   RBC 3.37 (*) 4.22 - 5.81 MIL/uL   Hemoglobin 11.8 (*) 13.0 - 17.0 g/dL   HCT 33.5 (*) 39.0 - 52.0 %   MCV 99.4  78.0 - 100.0 fL   MCH 35.0 (*) 26.0 - 34.0 pg   MCHC 35.2  30.0 - 36.0 g/dL   RDW 14.0  11.5 - 15.5 %   Platelets 96 (*) 150 - 400 K/uL  I-STAT TROPOININ, ED     Status: Abnormal   Collection Time    04/28/13  4:08 PM      Result Value Ref Range   Troponin i, poc 0.10 (*) 0.00 - 0.08 ng/mL   Comment NOTIFIED PHYSICIAN     Comment 3              Radiology/Studies: Dg Chest 2 View  04/17/2013   CLINICAL DATA:  Chest pain  EXAM: CHEST  2 VIEW  COMPARISON:  DG CHEST 2 VIEW dated 03/06/2012  FINDINGS: Low lung volumes. Cardiac silhouette moderate Lee enlarged. Areas of increased density appreciated within the mid and lower right hemi thorax at left lung base. Bilateral effusions are appreciated right greater than left. Osseous structures unremarkable. Atherosclerotic calcifications within the aorta. Joneen Caraway findings statistically consistent with a hiatal hernia in the retrocardiac region. There is also component of prominence of interstitial markings of mild peribronchial cuffing.  IMPRESSION: Multilobar pneumonitis. Small bilateral effusions right greater than left.  A component of pulmonary edema is also a diagnostic consideration.   Electronically Signed   By: Margaree Mackintosh M.D.   On: 04/17/2013 21:29    EKG: NSR, LBBB, LAD  ASSESSMENT AND PLAN:  Principal Problem:   Bradycardia with documented sinus pauses Active Problems:   Syncope   HYPERTENSION   CORONARY ATHEROSCLEROSIS NATIVE CORONARY ARTERY   CKD (chronic kidney disease) stage 3, GFR 30-59 ml/min   HYPERLIPIDEMIA   C O P D gold  stage A   Abnormality of gait   AAA (abdominal aortic aneurysm)   Neuropathy   LBBB (left bundle branch block)   PLAN: Admit to step down, Zoll pads in place. Cycle enzymes, stop Lopressor, hold Cozaar, start Heparin. I suspect he will need a pacemaker but ? Ischemic eval first (last SCr was 2.47)- today's lab pending.    Signed, Erlene Quan, PA-C 04/28/2013, 4:44 PM   Discussed with Amber, Dr Rayann Heman will see in am- he needs 48 hr washout off Lopressor.    I have examined the patient and reviewed assessment and plan and discussed with patient.  Agree with above as stated.  Patient with recurrent syncopal episodes. They are typical for a cardiac source. He has essentially no notice. These episodes have happened when he is sitting or standing. They're not related to change in position. He was started on trileptal to treat potential seizures. He has noticed no change in the syncope. The syncopal episodes have continued. He is on a low dosage of metoprolol. His monitor shows 8-9 second pauses. I do not think the pauses are due to the metoprolol. We will have electrophysiology see the patient and he will likely require permanent pacemaker.  The patient has also had coronary artery disease with 2 chronic total occlusions documented by cardiac cath in 2010. He has not had any exertional chest discomfort. 2 days ago, he had some severe indigestion which was relieved with Tums. I  do not think he needs an ischemic workup at this time.  He did have a minimally elevated point-of-care troponin. Given his significant renal insufficiency, this troponin is of unclear significance.  Based on his age and lack of symptoms, workup is not indicated.  Jettie Booze

## 2013-04-28 NOTE — ED Notes (Signed)
Per EMS- pt comes from home at 1200 today, felt like he was going to pass out, but didn't. He is wearing holter monitor for such episodes. Hit the button on his monitor to transmit to doctor's office. They told him to come here. Pt denies any CP. When EMS arrived to his house noted SR with LBBB. Pt is a x 4. Skin warm and dry.  CBG 232, BP 131/68, HR 73, RR 16

## 2013-04-28 NOTE — Progress Notes (Signed)
ANTICOAGULATION CONSULT NOTE - Initial Consult  Pharmacy Consult for heparin Indication: chest pain/ACS  Allergies  Allergen Reactions  . Penicillins Itching    Patient Measurements: Height: 5' 11.5" (181.6 cm) Weight: 176 lb (79.833 kg) IBW/kg (Calculated) : 76.45 Heparin Dosing Weight: 79.8kg  Vital Signs: Temp: 97.6 F (36.4 C) (04/14 1447) Temp src: Oral (04/14 1447) BP: 128/66 mmHg (04/14 1627) Pulse Rate: 66 (04/14 1627)  Labs:  Recent Labs  04/28/13 1550  HGB 11.8*  HCT 33.5*  PLT 96*  CREATININE 2.85*    Estimated Creatinine Clearance: 18.6 ml/min (by C-G formula based on Cr of 2.85).   Medical History: Past Medical History  Diagnosis Date  . Gout   . Stroke 1993, 1995    chronic balance issues  . Hyperlipidemia   . Emphysema   . Hypertension   . CAD (coronary artery disease)     stent 2006  . Seizure disorder   . GERD (gastroesophageal reflux disease)   . Cancer   . Asthma   . Syncope Jan. 1, 2014    Syncope  . AAA (abdominal aortic aneurysm)     Medications:  Infusions:  . heparin    . heparin      Assessment: 61 yom presented to the ED with bradycardia with sinus pauses. To start IV heparin for anticoagulation. Baseline H/H is slightly low and plts are low at 96 but provider ok with continuing since pt will likely be on only short-term. He is not on any anticoagulation PTA. Troponin is 0.1.  Goal of Therapy:  Heparin level 0.3-0.7 units/ml Monitor platelets by anticoagulation protocol: Yes   Plan:  1. Heparin bolus 4000 units IV x 1 2. Heparin gtt 1000 units/hr 3. Check an 8 hour heparin level 4. Daily heparin level and CBC - watch plts closely  Rande Lawman Awilda Covin 04/28/2013,5:00 PM

## 2013-04-28 NOTE — Progress Notes (Unsigned)
[name] had a cardiac monitor her that was placed approximately 2 weeks ago, with a phone call from a cardiac with documentation of a 6 second pause and subsequent 9 second positive, that was symptomatic. The patient actually pushed the alert button on the monitor. He is feeling lightheaded with numbness and tingling in his face and head.   As a result of this, we called the patient and his daughter to inform him of his abnormality. He is advised to stop the metoprolol. I spoke with Dr. Pennelope Strickland , on site, it was advised to speak with the EP physician at [facility] concerning need to have patient seen for pacemaker. I spoke with Dr. Thompson Strickland , reviewed the history with him, who advised that stopping the metoprolol would be helpful, and will need at least a 24-hour washout for a better assessment of his heart rate , and subsequent pauses. He stated the patient could be at home during this washout , but could potentially continue to have syncopal episodes.   I spoke with the patient's daughter [name] [phone], he was concerned about his recurrent syncopal episodes. He lives alone. She decided to go to his home to check on him. I discussed with her the possibility of pacemaker insertion should this be necessary, after speaking with Dr. Rayann Strickland. She wished me to speak with her father personally. Which I did via phone. He would like to have a pacemaker if this would be helpful to him. He is willing to be admitted. His daughter was uncomfortable with him being at home while he continued to be symptomatic.   The patient is being admitted to [facility] as a direct admit to be placed on telemetry . Of note the patient is on Plavix. This will be held as well. As his daughter is very comparable driving him to [facility], she is requested transport. I have called [facility] who is willing to bring the patient to [facility] to be admitted . He will not be seen to the ER, but will be a direct admit. The patient's daughter and  himself are willing to do so and verbalized understanding about admission and possibility of pacemaker implantation.   Followup orders and assessment to be [facility]. Copies of telemetry strips and most recent office note is provided to [facility] via fax to [facility] in the Cath Lab.

## 2013-04-28 NOTE — ED Provider Notes (Signed)
MSE was initiated and I personally evaluated the patient and placed orders (if any) at  3:12 PM on April 28, 2013.  The patient appears stable so that the remainder of the MSE may be completed by another provider.  Patient was sent in by Cardiology for a direct admit for pauses on his monitor. He had two episodes of near syncope today. EKg shows sinus rhythm.  Has bed at [facility]. Alvino Chapel, MD 04/28/13 1513

## 2013-04-28 NOTE — ED Notes (Signed)
Cardiology at bedside.

## 2013-04-29 ENCOUNTER — Encounter (HOSPITAL_COMMUNITY)
Admission: EM | Disposition: A | Discharge status: Home / Self Care | Payer: Self-pay | Source: Home / Self Care | Attending: Interventional Cardiology

## 2013-04-29 DIAGNOSIS — I495 Sick sinus syndrome: Principal | ICD-10-CM

## 2013-04-29 HISTORY — PX: PACEMAKER INSERTION: SHX728

## 2013-04-29 HISTORY — PX: PERMANENT PACEMAKER INSERTION: SHX5480

## 2013-04-29 LAB — BASIC METABOLIC PANEL
BUN: 29 mg/dL — ABNORMAL HIGH (ref 6–23)
CO2: 28 mEq/L (ref 19–32)
Calcium: 8.7 mg/dL (ref 8.4–10.5)
Chloride: 97 mEq/L (ref 96–112)
Creatinine, Ser: 2.8 mg/dL — ABNORMAL HIGH (ref 0.50–1.35)
GFR calc Af Amer: 21 mL/min — ABNORMAL LOW (ref >90)
GFR calc non Af Amer: 18 mL/min — ABNORMAL LOW (ref >90)
Glucose, Bld: 101 mg/dL — ABNORMAL HIGH (ref 70–99)
Potassium: 3.8 mEq/L (ref 3.7–5.3)
Sodium: 138 mEq/L (ref 137–147)

## 2013-04-29 LAB — CBC
HEMATOCRIT: 31.6 % — AB (ref 39.0–52.0)
Hemoglobin: 11.1 g/dL — ABNORMAL LOW (ref 13.0–17.0)
MCH: 34.5 pg — ABNORMAL HIGH (ref 26.0–34.0)
MCHC: 35.1 g/dL (ref 30.0–36.0)
MCV: 98.1 fL (ref 78.0–100.0)
Platelets: 102 10*3/uL — ABNORMAL LOW (ref 150–400)
RBC: 3.22 MIL/uL — AB (ref 4.22–5.81)
RDW: 14 % (ref 11.5–15.5)
WBC: 5.5 10*3/uL (ref 4.0–10.5)

## 2013-04-29 LAB — HEPARIN LEVEL (UNFRACTIONATED): Heparin Unfractionated: <0.1 IU/mL — ABNORMAL LOW (ref 0.30–0.70)

## 2013-04-29 LAB — TROPONIN I
Troponin I: <0.3 ng/mL (ref <0.30)
Troponin I: <0.3 ng/mL (ref <0.30)

## 2013-04-29 SURGERY — PERMANENT PACEMAKER INSERTION
Anesthesia: LOCAL

## 2013-04-29 MED ORDER — SODIUM CHLORIDE 0.9 % IJ SOLN: 3.0000 mL | INTRAMUSCULAR | Status: DC | PRN

## 2013-04-29 MED ORDER — ACETAMINOPHEN 325 MG PO TABS
325.0000 mg | ORAL_TABLET | ORAL | Status: DC | PRN
Start: 2013-04-29 — End: 2013-04-30

## 2013-04-29 MED ORDER — HEPARIN SODIUM (PORCINE) 1000 UNIT/ML IJ SOLN
INTRAMUSCULAR | Status: AC
  Filled 2013-04-29: qty 1

## 2013-04-29 MED ORDER — VANCOMYCIN HCL IN DEXTROSE 1-5 GM/200ML-% IV SOLN
1000.0000 mg | Freq: Two times a day (BID) | INTRAVENOUS | Status: AC
  Administered 2013-04-29: 1000 mg via INTRAVENOUS
  Filled 2013-04-29: qty 200

## 2013-04-29 MED ORDER — HYDROCODONE-ACETAMINOPHEN 5-325 MG PO TABS
1.0000 | ORAL_TABLET | ORAL | Status: DC | PRN
  Administered 2013-04-29 – 2013-04-30 (×2): 1 via ORAL
  Filled 2013-04-29 (×2): qty 1

## 2013-04-29 MED ORDER — SODIUM CHLORIDE 0.45 % IV SOLN: INTRAVENOUS | Status: DC

## 2013-04-29 MED ORDER — LIDOCAINE HCL (PF) 1 % IJ SOLN
INTRAMUSCULAR | Status: AC
  Filled 2013-04-29: qty 60

## 2013-04-29 MED ORDER — SODIUM CHLORIDE 0.9 % IJ SOLN: 3.0000 mL | Freq: Two times a day (BID) | INTRAMUSCULAR | Status: DC

## 2013-04-29 MED ORDER — ONDANSETRON HCL 4 MG/2ML IJ SOLN: 4.0000 mg | Freq: Four times a day (QID) | INTRAMUSCULAR | Status: DC | PRN

## 2013-04-29 MED ORDER — HEPARIN BOLUS VIA INFUSION
2000.0000 [IU] | Freq: Once | INTRAVENOUS | Status: AC
  Administered 2013-04-29: 2000 [IU] via INTRAVENOUS
  Filled 2013-04-29: qty 2000

## 2013-04-29 MED ORDER — FAMOTIDINE 20 MG PO TABS
20.0000 mg | ORAL_TABLET | Freq: Every day | ORAL | Status: DC
  Administered 2013-04-29 – 2013-04-30 (×2): 20 mg via ORAL
  Filled 2013-04-29 (×2): qty 1

## 2013-04-29 MED ORDER — SODIUM CHLORIDE 0.9 % IV SOLN: 250.0000 mL | INTRAVENOUS | Status: DC | PRN

## 2013-04-29 MED ORDER — VANCOMYCIN HCL IN DEXTROSE 1-5 GM/200ML-% IV SOLN
1000.0000 mg | INTRAVENOUS | Status: DC
  Filled 2013-04-29: qty 200

## 2013-04-29 MED ORDER — SODIUM CHLORIDE 0.9 % IV SOLN
INTRAVENOUS | Status: DC
  Administered 2013-04-29: 50 mL/h via INTRAVENOUS

## 2013-04-29 MED ORDER — ALLOPURINOL 100 MG PO TABS
200.0000 mg | ORAL_TABLET | Freq: Every morning | ORAL | Status: DC
  Administered 2013-04-29 – 2013-04-30 (×2): 200 mg via ORAL
  Filled 2013-04-29 (×2): qty 2

## 2013-04-29 MED ORDER — YOU HAVE A PACEMAKER BOOK
Freq: Once | Status: AC
  Administered 2013-04-29: 20:00:00
  Filled 2013-04-29: qty 1

## 2013-04-29 MED ORDER — SODIUM CHLORIDE 0.9 % IR SOLN
80.0000 mg | Status: DC
  Filled 2013-04-29 (×2): qty 2

## 2013-04-29 MED ORDER — HEPARIN (PORCINE) IN NACL 2-0.9 UNIT/ML-% IJ SOLN
INTRAMUSCULAR | Status: AC
  Filled 2013-04-29: qty 500

## 2013-04-29 MED ORDER — MIDAZOLAM HCL 5 MG/5ML IJ SOLN
INTRAMUSCULAR | Status: AC
  Filled 2013-04-29: qty 5

## 2013-04-29 MED ORDER — LIDOCAINE HCL (PF) 1 % IJ SOLN
INTRAMUSCULAR | Status: AC
  Filled 2013-04-29: qty 30

## 2013-04-29 MED ORDER — CHLORHEXIDINE GLUCONATE 4 % EX LIQD
60.0000 mL | Freq: Once | CUTANEOUS | Status: AC
  Administered 2013-04-29: 4 via TOPICAL
  Filled 2013-04-29: qty 15

## 2013-04-29 MED ORDER — METOPROLOL TARTRATE 25 MG PO TABS
25.0000 mg | ORAL_TABLET | Freq: Every morning | ORAL | Status: DC
  Administered 2013-04-30: 25 mg via ORAL
  Filled 2013-04-29: qty 1

## 2013-04-29 NOTE — Interval H&P Note (Signed)
History and Physical Interval Note:  04/29/2013 1:57 PM  [name]  has presented today for surgery, with the diagnosis of bradycardia  The various methods of treatment have been discussed with the patient and family. After consideration of risks, benefits and other options for treatment, the patient has consented to  Procedure(s): PERMANENT PACEMAKER INSERTION (N/A) as a surgical intervention .  The patient's history has been reviewed, patient examined, no change in status, stable for surgery.  I have reviewed the patient's chart and labs.  Questions were answered to the patient's satisfaction.     Thompson Grayer

## 2013-04-29 NOTE — Care Management Note (Addendum)
  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   (If response is "NO", the following Medicare IM given date fields will be blank) Date Medicare IM given:   Date Additional Medicare IM given:    Discharge Disposition:  HOME/SELF CARE  Per UR Regulation:  Reviewed for med. necessity/level of care/duration of stay  If discussed at [place] of Stay Meetings, dates discussed:    Comments:  Device - [place] DR on 04/29/2013 Hx/o Patient completed AD during this admission with SW services [facility] [place] and [employer]. Dispostion:  Home / Self care. Hodge Stachnik RN, BSN, [place], [place] 04/30/2013

## 2013-04-29 NOTE — H&P (View-Only) (Signed)
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 male with a h/o CAD, HTN, and prior stroke now admitted with sick sinus syndrome.  He reports having intermittent syncope for over a month.  Episodes are abrupt in onset and last 10-15 seconds.  He was evaluated by Dr Harl Bowie and had an event monitor placed.  This has documented multiple pauses of over 6 seconds in duration which correlate to symptoms of presyncope.  He is on low dose metoprolol chronically which he is felt to require for management of CAD.  Aug 2006 cath showed an occluded RCA and 99% CFX. He underwent CFX BMS then. In Dec 2010 he was cathed and had occluded his CFX but had Lt-Lt collaterals, He did have residual 50-60% LAD then. He has CRI and an AAA- 4.67cm X 4.4cm July 2014.   Today, he denies symptoms of palpitations, orthopnea, PND, lower extremity edema, dizziness, presyncope, syncope, or neurologic sequela.  He has angina and SOB chronically for which medical therapy is advised. The patient is otherwise without complaint today.   Past Medical History  Diagnosis Date  . Gout   . Stroke 1993, 1995    chronic balance issues  . Hyperlipidemia   . Emphysema   . Hypertension   . CAD (coronary artery disease)     stent 2006  . Seizure disorder   . GERD (gastroesophageal reflux disease)   . Cancer   . Asthma   . Syncope Jan. 1, 2014    Syncope  . AAA (abdominal aortic aneurysm)    Past Surgical History  Procedure Laterality Date  . Cardiac catheterization  08/23/2004    bare metal stenting of the proximal circumflex  . Colonoscopy      X 2  . Colonoscopy  11/01/2011    Procedure: COLONOSCOPY;  Surgeon: Rogene Houston, MD;  [facility];  Service: Endoscopy;  Laterality: N/A;  100  . Eye surgery    . Nose surgery      Current Facility-Administered Medications  Medication Dose Route Frequency Provider Last Rate Last Dose  . acetaminophen (TYLENOL)  tablet 650 mg  650 mg Oral Q4H PRN Erlene Quan, PA-C      . allopurinol (ZYLOPRIM) tablet 300 mg  300 mg Oral q morning - 10a Luke K Kilroy, PA-C      . aspirin EC tablet 81 mg  81 mg Oral Daily Luke K Kilroy, PA-C      . clopidogrel (PLAVIX) tablet 75 mg  75 mg Oral q morning - 10a Luke K Kilroy, PA-C      . donepezil (ARICEPT) tablet 10 mg  10 mg Oral QHS Erlene Quan, PA-C   10 mg at 04/28/13 2136  . famotidine (PEPCID) tablet 20 mg  20 mg Oral BID Erlene Quan, PA-C   20 mg at 04/28/13 2135  . furosemide (LASIX) tablet 40 mg  40 mg Oral Daily Luke K Kilroy, PA-C      . heparin ADULT infusion 100 units/mL (25000 units/250 mL)  1,300 Units/hr Intravenous Continuous Rogue Bussing, [facility] 13 mL/hr at 04/29/13 0256 1,300 Units/hr at 04/29/13 0256  . nitroGLYCERIN (NITROSTAT) SL tablet 0.4 mg  0.4 mg Sublingual Q5 Min x 3 PRN Luke K Kilroy, PA-C      . ondansetron [facility]) injection 4 mg  4 mg Intravenous Q6H PRN Erlene Quan, PA-C      . Oxcarbazepine (TRILEPTAL)  tablet 600 mg  600 mg Oral BID Erlene Quan, PA-C   600 mg at 04/28/13 2135  . oxybutynin (DITROPAN) tablet 5 mg  5 mg Oral BID Erlene Quan, PA-C   5 mg at 04/28/13 2135  . simvastatin (ZOCOR) tablet 5 mg  5 mg Oral q1800 Doreene Burke [place], PA-C   5 mg at 04/28/13 2136  . tamsulosin (FLOMAX) capsule 0.4 mg  0.4 mg Oral QHS Doreene Burke Kilroy, PA-C   0.4 mg at 04/28/13 2134  . tiotropium (SPIRIVA) inhalation capsule 18 mcg  18 mcg Inhalation Daily Erlene Quan, PA-C        Allergies  Allergen Reactions  . Penicillins Itching    History   Social History  . Marital Status: Widowed    Spouse Name: N/A    Number of Children: 2  . Years of Education: N/A   Occupational History  . Retired Multimedia programmer   Social History Main Topics  . Smoking status: Former Smoker -- 0.30 packs/day for 50 years    Types: Cigarettes    Quit date: 01/15/1990  . Smokeless tobacco: Never Used  . Alcohol Use: No  . Drug Use: No  .  Sexual Activity: Not on file   Other Topics Concern  . Not on file   Social History Narrative  . No narrative on file    Family History  Problem Relation Age of Onset  . Emphysema Brother   . Heart disease Sister   . Cancer Brother     lung  . Cancer Brother     throat  . Rheum arthritis Mother   . Arthritis Mother   . Rheum arthritis Father   . Colon cancer Neg Hx   . Diabetes      ROS- All systems are reviewed and negative except as per the HPI above  Physical Exam: Filed Vitals:   04/29/13 0200 04/29/13 0344 04/29/13 0400 04/29/13 0600  BP: 99/36 117/57 123/48 124/80  Pulse: 60 70 71 79  Temp:  97.6 F (36.4 C)    TempSrc:  Oral    Resp: 11 13 12 11   Height:      Weight:      SpO2: 96% 99% 97% 95%    GEN- The patient is elderly appearing, alert and oriented x 3 today.   Head- normocephalic, atraumatic Eyes-  Sclera clear, conjunctiva pink Ears- hearing intact Oropharynx- clear Neck- supple,   Lungs- Clear to ausculation bilaterally, normal work of breathing Heart- Regular rate and rhythm, no murmurs, rubs or gallops, PMI not laterally displaced GI- soft, NT, ND, + BS Extremities- no clubbing, cyanosis, or edema MS- no significant deformity or atrophy Skin- no rash or lesion Psych- euthymic mood, full affect Neuro- strength and sensation are intact  EKG sinus rhythm with IVCD (QRS 126 msec) long first degree AV block  Assessment and Plan:  1. Syncope/ sick sinus syndrome The patient has recurrent syncope secondary to sinus node dysfunction as captured on recent event monitor.  He has severe CAD for which he requires metoprolol for anginal symptoms long term.  No reversible causes have been identified.  I would therefore recommend pacemaker implantation at this time.  Risks, benefits, alternatives to pacemaker implantation were discussed in detail with the patient today. The patient understands that the risks include but are not limited to bleeding,  infection, pneumothorax, perforation, tamponade, vascular damage, renal failure, MI, stroke, death,  and lead dislodgement and wishes  to proceed. We will therefore schedule the procedure at the next available time.  2. CAD Stable canadian class II angina Dr Beau Fanny has evaluated and recommends medical therapy Stop heparin drip as cms are negative Echo 4/15 is reviewed  3. htn Stable No change required today  4. CRI Follow creatinine while here.

## 2013-04-29 NOTE — Progress Notes (Signed)
Chaplain facilitated the finalization of an Advance Directive by recruiting 2 witnesses, scheduling the notary, being available to respond to questions about the AD, making copies of the notarized Advance Directive. Chaplain gave a copy of AD to pt's nurse, who will put it in the pt's chart.  Chaplain gave 4 copies of AD (including the original) to pt.  Chaplain continued to give support to pt and his family through a compassionate presence, caring conversation and empthic listening.     04/29/13 1300  Clinical Encounter Type  Visited With Patient and family together  Visit Type Follow-up  Advance Directives (For Healthcare)  Advance Directive Patient has advance directive, copy in chart    Estelle June, chaplain [phone]

## 2013-04-29 NOTE — Op Note (Signed)
SURGEON:  Thompson Grayer, MD     PREPROCEDURE DIAGNOSIS:  Symptomatic sick sinus syndrome and syncope    POSTPROCEDURE DIAGNOSIS:  Symptomatic sick sinus syndrome and syncope     PROCEDURES:   1.  Pacemaker implantation.     INTRODUCTION:  [name] is a 80 y.o. male with a history of sick sinus syndrome and recurrent syncope due to sinus pauses who presents today for pacemaker implantation.  The patient reports intermittent episodes of syncope over the past few months.  Event monitor has captured pauses of 9 seconds.   No reversible causes have been identified.  The patient therefore presents today for pacemaker implantation.     DESCRIPTION OF PROCEDURE:  Informed written consent was obtained, and  the patient was brought to the electrophysiology lab in a fasting state.  The patient required no sedation for the procedure today.  The patients left chest was prepped and draped in the usual sterile fashion by the EP lab staff. The skin overlying the left deltopectoral region was infiltrated with lidocaine for local analgesia.  A 4-cm incision was made over the left deltopectoral region.  A left subcutaneous pacemaker pocket was fashioned using a combination of sharp and blunt dissection. Electrocautery was required to assure hemostasis.    RA/RV Lead Placement: The left axillary vein was therefore cannulated.  No contrast was required for the procedure today. Through the left axillary vein, a Comptroller model [phone] (serial number  [id number]) right atrial lead and a [facility] Medical model [phone] (serial number  [id number]) right ventricular lead were advanced with fluoroscopic visualization into the right atrial appendage and right ventricular apex positions respectively.  Initial atrial lead P- waves measured 4 mV with impedance of 439 ohms and a threshold of 1.5 V at 0.4 msec.  Extensive right atrial mapping was performed but a better threshold could not be found.  Right ventricular lead  R-waves measured 11 mV with an impedance of 571 ohms and a threshold of 0.8 V at 0.4 msec.  Both leads were secured to the pectoralis fascia using #2-0 silk over the suture sleeves.   Device Placement:  The leads were then connected to a [place] DR  model [id number] (serial number  [id number]) pacemaker.  The pocket was irrigated with copious gentamicin solution.  The pacemaker was then placed into the pocket.  The pocket was then closed in 2 layers with 2.0 Vicryl suture for the subcutaneous and subcuticular layers.  Steri-  Strips and a sterile dressing were then applied.  There were no early apparent complications. No contrast was given for this procedure.    CONCLUSIONS:   1. Successful implantation of a [employer] Assurity DR  dual-chamber pacemaker for symptomatic sick sinus syndrome and syncope  2. No early apparent complications.           Thompson Grayer, MD 04/29/2013 3:23 PM

## 2013-04-29 NOTE — Progress Notes (Signed)
ANTICOAGULATION CONSULT NOTE - Follow Up Consult  Pharmacy Consult for heparin Indication: chest pain/ACS  Labs:  Recent Labs  04/28/13 1550 04/28/13 1846 04/29/13 0013 04/29/13 0113  HGB 11.8*  --   --  11.1*  HCT 33.5*  --   --  31.6*  PLT 96*  --   --  102*  APTT  --  41*  --   --   LABPROT  --  12.6  --   --   INR  --  0.96  --   --   HEPARINUNFRC  --   --  <0.10*  --   CREATININE 2.85* 2.72*  --  2.80*  TROPONINI  --  <0.30  --  <0.30    Assessment: 80yo male undetectable on heparin with initial dosing for possible ACS, plan for pacer.  Goal of Therapy:  Heparin level 0.3-0.7 units/ml   Plan:  Will rebolus with heparin 2000 units and increase gtt by 4 units/kg/hr to 1300 units/hr and check level in [place], PharmD, BCPS  04/29/2013,2:37 AM

## 2013-04-29 NOTE — Consult Note (Signed)
Primary Care Physician: FAGAN,ROY, MD Referring Physician:  Dr Varanasi   [name] is a 80 y.o. male with a h/o CAD, HTN, and prior stroke now admitted with sick sinus syndrome.  He reports having intermittent syncope for over a month.  Episodes are abrupt in onset and last 10-15 seconds.  He was evaluated by Dr Branch and had an event monitor placed.  This has documented multiple pauses of over 6 seconds in duration which correlate to symptoms of presyncope.  He is on low dose metoprolol chronically which he is felt to require for management of CAD.  Aug 2006 cath showed an occluded RCA and 99% CFX. He underwent CFX BMS then. In Dec 2010 he was cathed and had occluded his CFX but had Lt-Lt collaterals, He did have residual 50-60% LAD then. He has CRI and an AAA- 4.67cm X 4.4cm July 2014.   Today, he denies symptoms of palpitations, orthopnea, PND, lower extremity edema, dizziness, presyncope, syncope, or neurologic sequela.  He has angina and SOB chronically for which medical therapy is advised. The patient is otherwise without complaint today.   Past Medical History  Diagnosis Date  . Gout   . Stroke 1993, 1995    chronic balance issues  . Hyperlipidemia   . Emphysema   . Hypertension   . CAD (coronary artery disease)     stent 2006  . Seizure disorder   . GERD (gastroesophageal reflux disease)   . Cancer   . Asthma   . Syncope Jan. 1, 2014    Syncope  . AAA (abdominal aortic aneurysm)    Past Surgical History  Procedure Laterality Date  . Cardiac catheterization  08/23/2004    bare metal stenting of the proximal circumflex  . Colonoscopy      X 2  . Colonoscopy  11/01/2011    Procedure: COLONOSCOPY;  Surgeon: Najeeb U Rehman, MD;  Location[facility];  Service: Endoscopy;  Laterality: N/A;  100  . Eye surgery    . Nose surgery      Current Facility-Administered Medications  Medication Dose Route Frequency Provider Last Rate Last Dose  . acetaminophen (TYLENOL)  tablet 650 mg  650 mg Oral Q4H PRN Luke K Kilroy, PA-C      . allopurinol (ZYLOPRIM) tablet 300 mg  300 mg Oral q morning - 10a Luke K Kilroy, PA-C      . aspirin EC tablet 81 mg  81 mg Oral Daily Luke K Kilroy, PA-C      . clopidogrel (PLAVIX) tablet 75 mg  75 mg Oral q morning - 10a Luke K Kilroy, PA-C      . donepezil (ARICEPT) tablet 10 mg  10 mg Oral QHS Luke K Kilroy, PA-C   10 mg at 04/28/13 2136  . famotidine (PEPCID) tablet 20 mg  20 mg Oral BID Luke K Kilroy, PA-C   20 mg at 04/28/13 2135  . furosemide (LASIX) tablet 40 mg  40 mg Oral Daily Luke K Kilroy, PA-C      . heparin ADULT infusion 100 units/mL (25000 units/250 mL)  1,300 Units/hr Intravenous Continuous Veronda Pauline Bryk, RPH 13 mL/hr at 04/29/13 0256 1,300 Units/hr at 04/29/13 0256  . nitroGLYCERIN (NITROSTAT) SL tablet 0.4 mg  0.4 mg Sublingual Q5 Min x 3 PRN Luke K Kilroy, PA-C      . ondansetron (ZOFRAN) injection 4 mg  4 mg Intravenous Q6H PRN Luke K Kilroy, PA-C      . Oxcarbazepine (TRILEPTAL)   tablet 600 mg  600 mg Oral BID Erlene Quan, PA-C   600 mg at 04/28/13 2135  . oxybutynin (DITROPAN) tablet 5 mg  5 mg Oral BID Erlene Quan, PA-C   5 mg at 04/28/13 2135  . simvastatin (ZOCOR) tablet 5 mg  5 mg Oral q1800 Doreene Burke [place], PA-C   5 mg at 04/28/13 2136  . tamsulosin (FLOMAX) capsule 0.4 mg  0.4 mg Oral QHS Doreene Burke Kilroy, PA-C   0.4 mg at 04/28/13 2134  . tiotropium (SPIRIVA) inhalation capsule 18 mcg  18 mcg Inhalation Daily Erlene Quan, PA-C        Allergies  Allergen Reactions  . Penicillins Itching    History   Social History  . Marital Status: Widowed    Spouse Name: N/A    Number of Children: 2  . Years of Education: N/A   Occupational History  . Retired Multimedia programmer   Social History Main Topics  . Smoking status: Former Smoker -- 0.30 packs/day for 50 years    Types: Cigarettes    Quit date: 01/15/1990  . Smokeless tobacco: Never Used  . Alcohol Use: No  . Drug Use: No  .  Sexual Activity: Not on file   Other Topics Concern  . Not on file   Social History Narrative  . No narrative on file    Family History  Problem Relation Age of Onset  . Emphysema Brother   . Heart disease Sister   . Cancer Brother     lung  . Cancer Brother     throat  . Rheum arthritis Mother   . Arthritis Mother   . Rheum arthritis Father   . Colon cancer Neg Hx   . Diabetes      ROS- All systems are reviewed and negative except as per the HPI above  Physical Exam: Filed Vitals:   04/29/13 0200 04/29/13 0344 04/29/13 0400 04/29/13 0600  BP: 99/36 117/57 123/48 124/80  Pulse: 60 70 71 79  Temp:  97.6 F (36.4 C)    TempSrc:  Oral    Resp: 11 13 12 11   Height:      Weight:      SpO2: 96% 99% 97% 95%    GEN- The patient is elderly appearing, alert and oriented x 3 today.   Head- normocephalic, atraumatic Eyes-  Sclera clear, conjunctiva pink Ears- hearing intact Oropharynx- clear Neck- supple,   Lungs- Clear to ausculation bilaterally, normal work of breathing Heart- Regular rate and rhythm, no murmurs, rubs or gallops, PMI not laterally displaced GI- soft, NT, ND, + BS Extremities- no clubbing, cyanosis, or edema MS- no significant deformity or atrophy Skin- no rash or lesion Psych- euthymic mood, full affect Neuro- strength and sensation are intact  EKG sinus rhythm with IVCD (QRS 126 msec) long first degree AV block  Assessment and Plan:  1. Syncope/ sick sinus syndrome The patient has recurrent syncope secondary to sinus node dysfunction as captured on recent event monitor.  He has severe CAD for which he requires metoprolol for anginal symptoms long term.  No reversible causes have been identified.  I would therefore recommend pacemaker implantation at this time.  Risks, benefits, alternatives to pacemaker implantation were discussed in detail with the patient today. The patient understands that the risks include but are not limited to bleeding,  infection, pneumothorax, perforation, tamponade, vascular damage, renal failure, MI, stroke, death,  and lead dislodgement and wishes  to proceed. We will therefore schedule the procedure at the next available time.  2. CAD Stable canadian class II angina Dr Varansi has evaluated and recommends medical therapy Stop heparin drip as cms are negative Echo 4/15 is reviewed  3. htn Stable No change required today  4. CRI Follow creatinine while here. 

## 2013-04-29 NOTE — Progress Notes (Signed)
Chaplain delivered Advance Directive documents to pt for his and his family's review.  Pt mentioned he is scheduled for surgery (pacemaker) at 1:00.  Chaplain indicated he will return in 45 minutes, after he and his family have had an opportunity to review the documents.   04/29/13 1100  Clinical Encounter Type  Visited With Patient and family together  Visit Type Spiritual support;Pre-op  Referral From Social work  Regulatory affairs officer (For Healthcare)  Advance Directive Patient would like information   Estelle June, chaplain [phone]

## 2013-04-29 NOTE — Progress Notes (Signed)
Clinical Social Work Department BRIEF PSYCHOSOCIAL ASSESSMENT 04/29/2013  Patient:  [name], [name]     Account Number:  [id number]     Admit date:  04/28/2013  Clinical Social Worker:  Freeman Caldron  Date/Time:  04/29/2013 02:17 PM  Referred by:  Physician  Date Referred:  04/29/2013 Referred for  Advanced Directives   Other Referral:   Interview type:  Patient Other interview type:   Pt's children and extended family in room    PSYCHOSOCIAL DATA Living Status:  ALONE Admitted from facility:   Level of care:   Primary support name:  Emmaline Life ([phone]) Primary support relationship to patient:  CHILD, ADULT Degree of support available:   Good--pt's two adult children, grandson, and additional relative in room.    CURRENT CONCERNS Current Concerns  Other - See comment   Other Concerns:   Advanced directives    SOCIAL WORK ASSESSMENT / PLAN CSW explained consult for advanced directives, and pt states he would be interested in completing documents. Paged spiritual care, and chaplain brought documents to pt's room, had them notarized, and left copy on pt's chart. Orginial and additional copies given to pt/family.   Assessment/plan status:  No Further Intervention Required Other assessment/ plan:   Information/referral to community resources:   Advanced directives completed by chaplain    PATIENT'S/FAMILY'S RESPONSE TO PLAN OF CARE: Good--pt and family participated in conversation with CSW, understanding of CSW role and appreciative of chaplain assistance. No further CSW needs identified-CSW signing off and can be re-consulted if needed.       Ky Barban, MSW, [facility] Clinical Social Worker [phone]

## 2013-04-30 ENCOUNTER — Inpatient Hospital Stay (HOSPITAL_COMMUNITY): Payer: Medicare Other

## 2013-04-30 ENCOUNTER — Encounter (HOSPITAL_COMMUNITY): Payer: Self-pay | Admitting: Cardiology

## 2013-04-30 LAB — BASIC METABOLIC PANEL
BUN: 28 mg/dL — ABNORMAL HIGH (ref 6–23)
CALCIUM: 8.8 mg/dL (ref 8.4–10.5)
CO2: 25 mEq/L (ref 19–32)
Chloride: 101 mEq/L (ref 96–112)
Creatinine, Ser: 2.77 mg/dL — ABNORMAL HIGH (ref 0.50–1.35)
GFR calc Af Amer: 22 mL/min — ABNORMAL LOW (ref >90)
GFR, EST NON AFRICAN AMERICAN: 19 mL/min — AB (ref >90)
GLUCOSE: 96 mg/dL (ref 70–99)
POTASSIUM: 4.1 meq/L (ref 3.7–5.3)
SODIUM: 140 meq/L (ref 137–147)

## 2013-04-30 MED ORDER — MAGNESIUM HYDROXIDE 400 MG/5ML PO SUSP
30.0000 mL | ORAL | Status: DC | PRN
  Administered 2013-04-30: 30 mL via ORAL
  Filled 2013-04-30: qty 30

## 2013-04-30 NOTE — Progress Notes (Signed)
Patient: [name] Date of Encounter: 04/30/2013, 7:05 AM Admit date: 04/28/2013     Subjective  [name] reports mild implant site soreness but is otherwise without complaint. He denies CP or SOB.   Objective  Physical Exam: Vitals: BP 134/62  Pulse 79  Temp(Src) 97.7 F (36.5 C) (Oral)  Resp 20  Ht 5\' 11"  (1.803 m)  Wt 175 lb 0.7 oz (79.4 kg)  BMI 24.42 kg/m2  SpO2 96% General: Well developed, well appearing 80 year old male in no acute distress. Neck: Supple. JVD not elevated. Lungs: Clear bilaterally to auscultation without wheezes, rales, or rhonchi. Breathing is unlabored. Heart: RRR S1 S2 without murmurs, rubs, or gallops.  Abdomen: Soft, non-distended. Extremities: No clubbing or cyanosis. No edema.  Distal pedal pulses are 2+ and equal bilaterally. Neuro: Alert and oriented X 3. Moves all extremities spontaneously. No focal deficits. Skin: Left upper chest / implant site intact without bleeding or hematoma.  Intake/Output:  Intake/Output Summary (Last 24 hours) at 04/30/13 0705 Last data filed at 04/30/13 0416  Gross per 24 hour  Intake    600 ml  Output   1025 ml  Net   -425 ml    Inpatient Medications:  . allopurinol  200 mg Oral q morning - 10a  . clopidogrel  75 mg Oral q morning - 10a  . donepezil  10 mg Oral QHS  . famotidine  20 mg Oral Daily  . metoprolol tartrate  25 mg Oral q morning - 10a  . Oxcarbazepine  600 mg Oral BID  . oxybutynin  5 mg Oral BID  . simvastatin  5 mg Oral q1800  . tamsulosin  0.4 mg Oral QHS  . tiotropium  18 mcg Inhalation Daily    Labs:  Recent Labs  04/28/13 1550 04/28/13 1846 04/29/13 0113 04/30/13 0408  NA 139 140 138 140  K 5.2 4.4 3.8 4.1  CL 98 97 97 101  CO2 30 31 28 25   GLUCOSE 147* 114* 101* 96  BUN 27* 26* 29* 28*  CREATININE 2.85* 2.72* 2.80* 2.77*  CALCIUM 9.2 9.1 8.7 8.8  MG  --  2.1  --   --     Recent Labs  04/28/13 1846  AST 15  ALT 12  ALKPHOS 61  BILITOT 0.4  PROT 6.4    ALBUMIN 3.8    Recent Labs  04/28/13 1550 04/29/13 0113  WBC 5.4 5.5  HGB 11.8* 11.1*  HCT 33.5* 31.6*  MCV 99.4 98.1  PLT 96* 102*    Recent Labs  04/28/13 1846 04/29/13 0113 04/29/13 0520  TROPONINI <0.30 <0.30 <0.30    Recent Labs  04/28/13 1845  TSH 1.740    Recent Labs  04/28/13 1846  INR 0.96    Radiology/Studies: Chest x-ray this AM - stable lead placement Telemetry - SR Device interrogation - pending   Assessment and Plan  1. Sinus node dysfunction s/p PPM implant 2. CAD 3. CKD 4. Prior CVA 5. HTN 6. Asthma / COPD [name] is doing well post PPM implant. Chest x-ray shows stable lead placement. Wound intact without bleeding or hematoma. Device interrogation pending. If device interrogation shows normal PPM function, will DC home today. Reviewed DC instructions and follow-up.   Signed, Azzie Roup Edmisten PA-C  I have seen, examined the patient, and reviewed the above assessment and plan.  Changes to above are made where necessary.  Device interrogation and CXR are reviewed and normal. Routine wound care  and follow-up.   Co Sign: Thompson Grayer, MD 04/30/2013 8:07 AM

## 2013-04-30 NOTE — Discharge Summary (Signed)
ELECTROPHYSIOLOGY DISCHARGE SUMMARY   Patient ID: [name],  MRN: [id number], DOB/AGE: [birth year] 80 y.o.  Admit date: 04/28/2013 Discharge date: 04/30/2013  Primary Care Physician: Asencion Noble, MD Primary Cardiologist: Carlyle Dolly, MD Primary EP: Will be Cristopher Peru, MD (patient requests follow-up in [place] office)  Primary Discharge Diagnosis:  Sinus node dysfunction with syncope and sinus pauses s/p PPM implantation  Secondary Discharge Diagnoses:  CAD HTN Chronic renal insufficiency AAA - 4.67 x 4.4 cm July 2014 Prior CVA Dyslipidemia Asthma / COPD Seizure disorder Gout  Procedures This Admission:   1. Dual chamber PPM implantation RA lead Designer, fashion/clothing model [phone] (serial number [id number]) right atrial lead  RV lead Designer, fashion/clothing model [phone] (serial number [id number]) right ventricular lead  Device - [address] Jude Medical Assurity DR model [phone] (serial number [id number]) pacemaker  History and Hospital Course: [name] is a 80 year old man with known CAD, HTN, dyslipidemia, prior CVA, seizure disorder, CRI and PVD who presented 04/28/2013 with recurrent syncope. He has experienced intermittent episodes of syncope over the past few months. Dr. Harl Bowie ordered an event monitor which revealed sinus pauses up to 9 seconds. [name] takes metoprolol and continuation of this medication is needed in setting of known CAD with stable angina. No reversible causes were identified. Therefore, [name] underwent dual chamber PPM implantation on 04/29/2013. He tolerated this procedure well without any immediate complication. He remains hemodynamically stable and afebrile. His chest xray shows stable lead placement without pneumothorax. His device interrogation shows normal PPM function with stable lead parameters/measurements. His implant site is intact without significant bleeding or hematoma. Telemetry reveals SR, no arrhythmias. He has been given discharge instructions  including wound care and activity restrictions. He will follow-up in 10 days for wound check. There were no changes made to his medications. He has been seen, examined and deemed stable for discharge today by Dr. Thompson Grayer.  Discharge Vitals: Blood pressure 134/62, pulse 79, temperature 97.7 F (36.5 C), temperature source Oral, resp. rate 20, height 5\' 11"  (1.803 m), weight 175 lb 0.7 oz (79.4 kg), SpO2 96.00%.   Labs: Lab Results  Component Value Date   WBC 5.5 04/29/2013   HGB 11.1* 04/29/2013   HCT 31.6* 04/29/2013   MCV 98.1 04/29/2013   PLT 102* 04/29/2013    Recent Labs Lab 04/28/13 1846  04/30/13 0408  NA 140  < > 140  K 4.4  < > 4.1  CL 97  < > 101  CO2 31  < > 25  BUN 26*  < > 28*  CREATININE 2.72*  < > 2.77*  CALCIUM 9.1  < > 8.8  PROT 6.4  --   --   BILITOT 0.4  --   --   ALKPHOS 61  --   --   ALT 12  --   --   AST 15  --   --   GLUCOSE 114*  < > 96  < > = values in this interval not displayed. Lab Results  Component Value Date   CKTOTAL 124 06/08/2010   CKMB 2.8 06/08/2010   TROPONINI <0.30 04/29/2013     Disposition:  The patient is being discharged in stable condition.  Follow-up: Follow-up Information   Follow up with [facility] On 05/07/2013. (At 4:30 PM for wound check)    Specialty:  Cardiology   Contact information:   [address], [place] [address]  [phone]      Follow up with [facility] In 3 months. (To see Dr. Lovena Le; Our office will notify you of your appointment date and time)    Specialty:  Cardiology   Contact information:   [place] [address] [phone]     Discharge Medications:    Medication List         allopurinol 300 MG tablet  Commonly known as:  ZYLOPRIM  Take 300 mg by mouth every morning.     clopidogrel 75 MG tablet  Commonly known as:  PLAVIX  Take 75 mg by mouth every morning.     donepezil 10 MG tablet  Commonly known as:  ARICEPT    Take 10 mg by mouth at bedtime.     famotidine 20 MG tablet  Commonly known as:  PEPCID  Take 1 tablet (20 mg total) by mouth 2 (two) times daily.     furosemide 40 MG tablet  Commonly known as:  LASIX  Take 1 tablet (40 mg total) by mouth daily.     levofloxacin 250 MG tablet  Commonly known as:  LEVAQUIN  Take 1 tablet (250 mg total) by mouth daily.     Linaclotide 145 MCG Caps capsule  Commonly known as:  LINZESS  Take 1 capsule (145 mcg total) by mouth daily.     losartan 100 MG tablet  Commonly known as:  COZAAR  Take 100 mg by mouth daily.     metoprolol tartrate 25 MG tablet  Commonly known as:  LOPRESSOR  Take 1 tablet (25 mg total) by mouth every morning.     nitroGLYCERIN 0.4 MG SL tablet  Commonly known as:  NITROSTAT  Place 1 tablet (0.4 mg total) under the tongue every 5 (five) minutes as needed for chest pain. 1 tablet under tongue at onset of chest pain;you may repeat every 5 minutes for up to 3 doses.     Oxcarbazepine 300 MG tablet  Commonly known as:  TRILEPTAL  Take 600 mg by mouth 2 (two) times daily. total 1200 mg daily     oxybutynin 5 MG tablet  Commonly known as:  DITROPAN  Take 5 mg by mouth 2 (two) times daily.     pravastatin 40 MG tablet  Commonly known as:  PRAVACHOL  Take 40 mg by mouth daily. Daily at bedtime     tamsulosin 0.4 MG Caps capsule  Commonly known as:  FLOMAX  Take 0.4 mg by mouth at bedtime.     tiotropium 18 MCG inhalation capsule  Commonly known as:  SPIRIVA HANDIHALER  Place 1 capsule (18 mcg total) into inhaler and inhale daily.     VITAMIN B1-B12 IM  Inject into the muscle every 30 (thirty) days. Once a month       Duration of Discharge Encounter: Greater than 30 minutes including physician time.  Darrick Huntsman, PA-C 04/30/2013, 7:18 AM   Thompson Grayer MD

## 2013-04-30 NOTE — Discharge Instructions (Signed)
° °  Supplemental Discharge Instructions for  Pacemaker/Defibrillator Patients  Activity No heavy lifting or vigorous activity with your left/right arm for 6 to 8 weeks.  Do not raise your left/right arm above your head for one week.  Gradually raise your affected arm as drawn below.           04/19                      04/20                       04/21                      04/22       NO DRIVING for 6 weeks until given clearance by Dr. Rayann Heman. WOUND CARE   Keep the wound area clean and dry.  Do not get this area wet for one week. No showers for one week; you may shower on 05/08/2013.   The tape/steri-strips on your wound will fall off; do not pull them off.  No bandage is needed on the site.  DO  NOT apply any creams, oils, or ointments to the wound area.   If you notice any drainage or discharge from the wound, any swelling or bruising at the site, or you develop a fever > 101? F after you are discharged home, call the office at once.  Special Instructions   You are still able to use cellular telephones; use the ear opposite the side where you have your pacemaker/defibrillator.  Avoid carrying your cellular phone near your device.   When traveling through airports, show security personnel your identification card to avoid being screened in the metal detectors.  Ask the security personnel to use the hand wand.   Avoid arc welding equipment, MRI testing (magnetic resonance imaging), TENS units (transcutaneous nerve stimulators).  Call the office for questions about other devices.   Avoid electrical appliances that are in poor condition or are not properly grounded.   Microwave ovens are safe to be near or to operate.

## 2013-05-01 ENCOUNTER — Encounter: Payer: Self-pay | Admitting: *Deleted

## 2013-05-04 ENCOUNTER — Other Ambulatory Visit: Payer: Self-pay | Admitting: *Deleted

## 2013-05-04 DIAGNOSIS — R55 Syncope and collapse: Secondary | ICD-10-CM

## 2013-05-07 ENCOUNTER — Ambulatory Visit (INDEPENDENT_AMBULATORY_CARE_PROVIDER_SITE_OTHER): Payer: Medicare Other | Admitting: *Deleted

## 2013-05-07 ENCOUNTER — Encounter: Payer: Self-pay | Admitting: Internal Medicine

## 2013-05-07 DIAGNOSIS — Z950 Presence of cardiac pacemaker: Secondary | ICD-10-CM

## 2013-05-07 DIAGNOSIS — R001 Bradycardia, unspecified: Secondary | ICD-10-CM

## 2013-05-07 DIAGNOSIS — I498 Other specified cardiac arrhythmias: Secondary | ICD-10-CM

## 2013-05-07 LAB — MDC_IDC_ENUM_SESS_TYPE_INCLINIC
Battery Remaining Longevity: 127.2 mo
Battery Voltage: 3.05 V
Brady Statistic RA Percent Paced: 12 %
Brady Statistic RV Percent Paced: 9.2 %
Date Time Interrogation Session: 20150423170440
Implantable Pulse Generator Model: 2240
Implantable Pulse Generator Serial Number: 3013737
Lead Channel Impedance Value: 575 Ohm
Lead Channel Pacing Threshold Amplitude: 0.75 V
Lead Channel Pacing Threshold Amplitude: 1.25 V
Lead Channel Pacing Threshold Amplitude: 1.25 V
Lead Channel Pacing Threshold Pulse Width: 0.5 ms
Lead Channel Pacing Threshold Pulse Width: 0.5 ms
Lead Channel Setting Pacing Amplitude: 3.5 V
Lead Channel Setting Pacing Pulse Width: 0.5 ms
MDC IDC MSMT LEADCHNL RA IMPEDANCE VALUE: 425 Ohm
MDC IDC MSMT LEADCHNL RA PACING THRESHOLD PULSEWIDTH: 0.5 ms
MDC IDC MSMT LEADCHNL RA SENSING INTR AMPL: 5 mV
MDC IDC MSMT LEADCHNL RV PACING THRESHOLD AMPLITUDE: 0.75 V
MDC IDC MSMT LEADCHNL RV PACING THRESHOLD PULSEWIDTH: 0.5 ms
MDC IDC MSMT LEADCHNL RV SENSING INTR AMPL: 8.5 mV
MDC IDC SET LEADCHNL RV PACING AMPLITUDE: 3.5 V
MDC IDC SET LEADCHNL RV SENSING SENSITIVITY: 2 mV

## 2013-05-07 NOTE — Progress Notes (Signed)
Wound check appointment. Steri-strips removed. Wound without redness or edema. Incision edges approximated, wound well healed. Normal device function. Thresholds, sensing, and impedances consistent with implant measurements. Device programmed at 3.5V/auto capture programmed on for extra safety margin until 3 month visit. Histogram distribution appropriate for patient and level of activity. No mode switches or high ventricular rates noted. Patient educated about wound care, arm mobility, lifting restrictions. ROV w/ JA/[place] in 32mo.

## 2013-05-14 ENCOUNTER — Encounter: Payer: Self-pay | Admitting: Cardiology

## 2013-05-14 ENCOUNTER — Ambulatory Visit (INDEPENDENT_AMBULATORY_CARE_PROVIDER_SITE_OTHER): Payer: Medicare Other | Admitting: Cardiology

## 2013-05-14 ENCOUNTER — Ambulatory Visit: Payer: Medicare Other | Admitting: Cardiology

## 2013-05-14 VITALS — BP 112/48 | HR 74 | Ht 71.0 in | Wt 171.0 lb

## 2013-05-14 DIAGNOSIS — I10 Essential (primary) hypertension: Secondary | ICD-10-CM

## 2013-05-14 DIAGNOSIS — E785 Hyperlipidemia, unspecified: Secondary | ICD-10-CM

## 2013-05-14 DIAGNOSIS — I455 Other specified heart block: Secondary | ICD-10-CM

## 2013-05-14 DIAGNOSIS — I2510 Atherosclerotic heart disease of native coronary artery without angina pectoris: Secondary | ICD-10-CM

## 2013-05-14 DIAGNOSIS — R55 Syncope and collapse: Secondary | ICD-10-CM

## 2013-05-14 NOTE — Progress Notes (Signed)
Clinical Summary [name] is a 80 y.o.male last seen by NP Purcell Nails, this is our first visit together.  1. Syncope/Sinus Pauses - multiple 6 second pauses noted on event monitor, patient was admitted for inpatient EP evaluation.  - 04/29/13 St Jude dual chamber pacemaker placed, device check 05/07/13 with normal function.  - denies any lightheadedness or dizziness. Has increased energy since device was placed  2. CAD - prior stent to RCA in 2006. Repeat cath 2010 with occluded LCX with left to left collaterals, LAD 50-60%. Medically managed  - denies any recent chest pain. SOB in morning, DOE with short distances that is somewhat better since getting pacemaker.  - he is on plavix for secondary prevention, the history I am unclear.   3. Abdominal aortic aneurysm - followed by Dr Early  4. HTN - does not check bp regularly - compliant with meds  5. Hyperlipidemia - compliant with pravastatin - reports recent panel by pcp   Past Medical History  Diagnosis Date  . Gout   . Stroke 1993, 1995    chronic balance issues  . Hyperlipidemia   . Emphysema   . Hypertension   . CAD (coronary artery disease)     stent 2006  . Seizure disorder   . GERD (gastroesophageal reflux disease)   . Cancer   . Asthma   . Syncope Jan. 1, 2014  . AAA (abdominal aortic aneurysm)   . Sinus node dysfunction     s/p PPM implant April 2015 (ST Jude)     Allergies  Allergen Reactions  . Penicillins Itching     Current Outpatient Prescriptions  Medication Sig Dispense Refill  . allopurinol (ZYLOPRIM) 300 MG tablet Take 300 mg by mouth every morning.       . clopidogrel (PLAVIX) 75 MG tablet Take 75 mg by mouth every morning.       . donepezil (ARICEPT) 10 MG tablet Take 10 mg by mouth at bedtime.       . famotidine (PEPCID) 20 MG tablet Take 1 tablet (20 mg total) by mouth 2 (two) times daily.  60 tablet  5  . furosemide (LASIX) 40 MG tablet Take 1 tablet (40 mg total) by mouth daily.   30 tablet  12  . levofloxacin (LEVAQUIN) 250 MG tablet Take 1 tablet (250 mg total) by mouth daily.  7 tablet  0  . Linaclotide (LINZESS) 145 MCG CAPS capsule Take 1 capsule (145 mcg total) by mouth daily.  30 capsule  3  . losartan (COZAAR) 100 MG tablet Take 100 mg by mouth daily.      . metoprolol tartrate (LOPRESSOR) 25 MG tablet Take 1 tablet (25 mg total) by mouth every morning.  60 tablet  3  . nitroGLYCERIN (NITROSTAT) 0.4 MG SL tablet Place 1 tablet (0.4 mg total) under the tongue every 5 (five) minutes as needed for chest pain. 1 tablet under tongue at onset of chest pain;you may repeat every 5 minutes for up to 3 doses.  25 tablet  3  . Oxcarbazepine (TRILEPTAL) 300 MG tablet Take 600 mg by mouth daily. Take 2 in the morning, 1 at night      . oxybutynin (DITROPAN) 5 MG tablet Take 5 mg by mouth 2 (two) times daily.       . pravastatin (PRAVACHOL) 40 MG tablet Take 40 mg by mouth daily. Daily at bedtime      . Tamsulosin HCl (FLOMAX) 0.4 MG CAPS Take 0.4  mg by mouth at bedtime.       Marland Kitchen tiotropium (SPIRIVA HANDIHALER) 18 MCG inhalation capsule Place 1 capsule (18 mcg total) into inhaler and inhale daily.  30 capsule  6  . VITAMIN B1-B12 IM Inject into the muscle every 30 (thirty) days. Once a month       No current facility-administered medications for this visit.     Past Surgical History  Procedure Laterality Date  . Cardiac catheterization  08/23/2004    bare metal stenting of the proximal circumflex  . Colonoscopy      X 2  . Colonoscopy  11/01/2011    Procedure: COLONOSCOPY;  Surgeon: Rogene Houston, MD;  Location: [facility];  Service: Endoscopy;  Laterality: N/A;  100  . Eye surgery    . Nose surgery    . Pacemaker insertion  04-29-13    STJ Assurity dual chamber pacemaker implanted by Dr Rayann Heman for syncope and sinus node dysfunction     Allergies  Allergen Reactions  . Penicillins Itching      Family History  Problem Relation Age of Onset  . Emphysema  Brother   . Heart disease Sister   . Cancer Brother     lung  . Cancer Brother     throat  . Rheum arthritis Mother   . Arthritis Mother   . Rheum arthritis Father   . Colon cancer Neg Hx   . Diabetes       Social History [name] reports that he quit smoking about 23 years ago. His smoking use included Cigarettes. He has a 15 pack-year smoking history. He has never used smokeless tobacco. [name] reports that he does not drink alcohol.   Review of Systems CONSTITUTIONAL: No weight loss, fever, chills, weakness or fatigue.  HEENT: Eyes: No visual loss, blurred vision, double vision or yellow sclerae.No hearing loss, sneezing, congestion, runny nose or sore throat.  SKIN: No rash or itching.  CARDIOVASCULAR: per HPI RESPIRATORY: No shortness of breath, cough or sputum.  GASTROINTESTINAL: No anorexia, nausea, vomiting or diarrhea. No abdominal pain or blood.  GENITOURINARY: No burning on urination, no polyuria NEUROLOGICAL: No headache, dizziness, syncope, paralysis, ataxia, numbness or tingling in the extremities. No change in bowel or bladder control.  MUSCULOSKELETAL: No muscle, back pain, joint pain or stiffness.  LYMPHATICS: No enlarged nodes. No history of splenectomy.  PSYCHIATRIC: No history of depression or anxiety.  ENDOCRINOLOGIC: No reports of sweating, cold or heat intolerance. No polyuria or polydipsia.  Marland Kitchen   Physical Examination Filed Vitals:   05/14/13 0916  BP: 112/48  Pulse: 74   Filed Weights   05/14/13 0916  Weight: 171 lb (77.565 kg)    Gen: resting comfortably, no acute distress HEENT: no scleral icterus, pupils equal round and reactive, no palptable cervical adenopathy,  CV: RRR, no m/r/g, no JVD, no carotid bruits Resp: Clear to auscultation bilaterally GI: abdomen is soft, non-tender, non-distended, normal bowel sounds, no hepatosplenomegaly MSK: extremities are warm, no edema.  Skin: warm, no rash Neuro:  no focal deficits Psych:  appropriate affect   Diagnostic Studies 04/2013 Echo Study Conclusions  - Left ventricle: The cavity size was normal. Wall thickness was increased in a pattern of severe LVH. There was mild focal basal hypertrophy of the septum. Systolic function was normal. The estimated ejection fraction was in the range of 55% to 60%. Wall motion was normal; there were no regional wall motion abnormalities. Doppler parameters are consistent with restrictive physiology,  indicative of decreased left ventricular diastolic compliance and/or increased left atrial pressure. - Aortic valve: Trivial regurgitation. - Mitral valve: Mild regurgitation. - Left atrium: The atrium was mildly dilated. - Pericardium, extracardiac: A small pericardial effusion was identified.   Cath 2010 FINDINGS: 1. Hemodynamics:  LV 127/12, aorta 130/58. 2. RCA:  The right coronary artery was totally occluded at the ostium.     There were some left-to-right collaterals.  These collaterals were     not robust. 3. Left main:  There was about a 30% distal left main stenosis. 4. Left circumflex system:  There was a small-to-moderate ramus     intermedius with about 60-70% ostial stenosis.  The circumflex     itself had a long total occlusion and it extended from the ostium     to just prior to the first obtuse marginal.  This was at the site     of a prior placed stent.  The patient did have good collaterals     from the LAD to the distal circumflex and three small-to-moderate     obtuse marginals filled. 5. LAD system:  There was a 50-60% proximal LAD stenosis just after     the first diagonal.  The remainder of the LAD had luminal     irregularities. 6. Left ventriculography was not done due to chronic renal     insufficiency.  IMPRESSION:  This is an 80 year old with progressive dyspnea on exertion who presented for evaluation of his known coronary artery disease.  The patient's right coronary artery was totally  occluded at the ostium. This is known from the past.  He does have some left-to-right collaterals; however, the collaterals are not robust.  The new finding is a totally occluded proximal circumflex extending from the ostium of the circumflex to just before the first obtuse marginal.  This is in the site of a stent that appears to have been placed in 2006.  The patient does have very good left anterior descending artery to the left circumflex collaterals and three obtuse marginals are filled.  The left anterior descending artery has a 50-60% proximal stenosis. I do not think that this stenosis is flow limiting; however, this could present problems over time with progression.  At this time, I think the best course is probably going to be medical management as I do not think that the proximal left anterior descending artery stenosis is significant to the point of needing revascularization.    Assessment and Plan  1. Syncope/Sinus pause - symptoms resolved after recent pacemaker placement - continue to follow in device clinic  2. CAD - no current symptoms - he is on plavix instead of ASA for secondary prevention, this history is unclear, but plavix is fine to continue  3. AAA - continue to follow with vascular  4. HTN - at goal, continue current meds  5. Hyperlipidemia - will f/u recent labs from pcp, continue current statin for now      Arnoldo Lenis, M.D., F.A.C.C.

## 2013-05-14 NOTE — Patient Instructions (Signed)
Your physician wants you to follow-up in: 6 mionths You will receive a reminder letter in the mail two months in advance. If you don't receive a letter, please call our office to schedule the follow-up appointment.     Your physician recommends that you continue on your current medications as directed. Please refer to the Current Medication list given to you today.     Thank you for choosing [facility] !

## 2013-05-29 ENCOUNTER — Other Ambulatory Visit: Payer: Self-pay | Admitting: Critical Care Medicine

## 2013-07-30 ENCOUNTER — Other Ambulatory Visit (INDEPENDENT_AMBULATORY_CARE_PROVIDER_SITE_OTHER): Payer: Self-pay | Admitting: Internal Medicine

## 2013-07-30 MED ORDER — LINACLOTIDE 145 MCG PO CAPS: 145.0000 ug | ORAL_CAPSULE | Freq: Every day | ORAL | Status: DC

## 2013-08-03 ENCOUNTER — Other Ambulatory Visit (INDEPENDENT_AMBULATORY_CARE_PROVIDER_SITE_OTHER): Payer: Self-pay | Admitting: Internal Medicine

## 2013-08-03 DIAGNOSIS — K5900 Constipation, unspecified: Secondary | ICD-10-CM

## 2013-08-03 MED ORDER — LINACLOTIDE 145 MCG PO CAPS: 145.0000 ug | ORAL_CAPSULE | Freq: Every day | ORAL | Status: DC

## 2013-08-05 ENCOUNTER — Encounter: Payer: Self-pay | Admitting: Internal Medicine

## 2013-08-05 ENCOUNTER — Ambulatory Visit (INDEPENDENT_AMBULATORY_CARE_PROVIDER_SITE_OTHER): Payer: Medicare Other | Admitting: Internal Medicine

## 2013-08-05 VITALS — BP 116/53 | HR 65 | Ht 71.5 in | Wt 171.8 lb

## 2013-08-05 DIAGNOSIS — Z950 Presence of cardiac pacemaker: Secondary | ICD-10-CM

## 2013-08-05 DIAGNOSIS — I498 Other specified cardiac arrhythmias: Secondary | ICD-10-CM

## 2013-08-05 DIAGNOSIS — I455 Other specified heart block: Secondary | ICD-10-CM

## 2013-08-05 DIAGNOSIS — R001 Bradycardia, unspecified: Secondary | ICD-10-CM

## 2013-08-05 DIAGNOSIS — I10 Essential (primary) hypertension: Secondary | ICD-10-CM

## 2013-08-05 DIAGNOSIS — I2510 Atherosclerotic heart disease of native coronary artery without angina pectoris: Secondary | ICD-10-CM

## 2013-08-05 LAB — MDC_IDC_ENUM_SESS_TYPE_INCLINIC
Battery Voltage: 3.02 V
Lead Channel Impedance Value: 425 Ohm
Lead Channel Pacing Threshold Amplitude: 0.75 V
Lead Channel Pacing Threshold Amplitude: 0.75 V
Lead Channel Pacing Threshold Pulse Width: 0.5 ms
Lead Channel Pacing Threshold Pulse Width: 0.5 ms
Lead Channel Sensing Intrinsic Amplitude: 3.2 mV
Lead Channel Sensing Intrinsic Amplitude: 9.5 mV
Lead Channel Setting Pacing Amplitude: 2 V
Lead Channel Setting Pacing Amplitude: 2.5 V
Lead Channel Setting Pacing Pulse Width: 0.5 ms
MDC IDC MSMT BATTERY REMAINING LONGEVITY: 130.8 mo
MDC IDC MSMT LEADCHNL RA PACING THRESHOLD AMPLITUDE: 0.75 V
MDC IDC MSMT LEADCHNL RA PACING THRESHOLD AMPLITUDE: 0.75 V
MDC IDC MSMT LEADCHNL RA PACING THRESHOLD PULSEWIDTH: 0.5 ms
MDC IDC MSMT LEADCHNL RA PACING THRESHOLD PULSEWIDTH: 0.5 ms
MDC IDC MSMT LEADCHNL RV IMPEDANCE VALUE: 575 Ohm
MDC IDC PG SERIAL: 3013737
MDC IDC SESS DTM: 20150722113222
MDC IDC SET LEADCHNL RV SENSING SENSITIVITY: 2 mV
MDC IDC STAT BRADY RA PERCENT PACED: 24 %
MDC IDC STAT BRADY RV PERCENT PACED: 18 %

## 2013-08-05 MED ORDER — FUROSEMIDE 40 MG PO TABS: 20.0000 mg | ORAL_TABLET | Freq: Every day | ORAL | Status: DC

## 2013-08-05 NOTE — Patient Instructions (Addendum)
Your physician recommends that you schedule a follow-up appointment in: 3-4 weeks with Richardson Dopp, PA  Your physician has recommended you make the following change in your medication:  1) Decrease Furosemide to 20mg  daily     Your physician wants you to follow-up in: 12 months with Dr Vallery Ridge will receive a reminder letter in the mail two months in advance. If you don't receive a letter, please call our office to schedule the follow-up appointment.   Remote monitoring is used to monitor your Pacemaker or ICD from home. This monitoring reduces the number of office visits required to check your device to one time per year. It allows [place] to keep an eye on the functioning of your device to ensure it is working properly. You are scheduled for a device check from home on 11/04/13. You may send your transmission at any time that day. If you have a wireless device, the transmission will be sent automatically. After your physician reviews your transmission, you will receive a postcard with your next transmission date.

## 2013-08-09 NOTE — Progress Notes (Signed)
PCP: Asencion Noble, MD Primary Cardiologist:  Dr Verl Blalock [name] in [place] office  [name] is a 80 y.o. male who presents today for routine electrophysiology followup.  Since his recent ppm implant, the patient reports doing very well.  His syncope has resolved.  Today, he denies symptoms of palpitations, chest pain,  lower extremity edema, dizziness, presyncope, or syncope.  He has stable sob and rare postural dizziness. The patient is otherwise without complaint today.   Past Medical History  Diagnosis Date  . Gout   . Stroke 1993, 1995    chronic balance issues  . Hyperlipidemia   . Emphysema   . Hypertension   . CAD (coronary artery disease)     stent 2006  . Seizure disorder   . GERD (gastroesophageal reflux disease)   . Cancer   . Asthma   . Syncope Jan. 1, 2014  . AAA (abdominal aortic aneurysm)   . Sinus node dysfunction     s/p PPM implant April 2015 ([facility])   Past Surgical History  Procedure Laterality Date  . Cardiac catheterization  08/23/2004    bare metal stenting of the proximal circumflex  . Colonoscopy      X 2  . Colonoscopy  11/01/2011    Procedure: COLONOSCOPY;  Surgeon: Rogene Houston, MD;  Location: [facility];  Service: Endoscopy;  Laterality: N/A;  100  . Eye surgery    . Nose surgery    . Pacemaker insertion  04-29-13    STJ Assurity dual chamber pacemaker implanted by Dr Rayann Heman for syncope and sinus node dysfunction    ROS- all systems are reviewed and negative except as per HPI above  Current Outpatient Prescriptions  Medication Sig Dispense Refill  . allopurinol (ZYLOPRIM) 300 MG tablet Take 300 mg by mouth every morning.       . clopidogrel (PLAVIX) 75 MG tablet Take 75 mg by mouth every morning.       . donepezil (ARICEPT) 10 MG tablet Take 10 mg by mouth at bedtime.       . famotidine (PEPCID) 20 MG tablet TAKE ONE TABLET BY MOUTH TWICE DAILY.  60 tablet  5  . furosemide (LASIX) 40 MG tablet Take 0.5 tablets (20 mg total)  by mouth daily.  30 tablet  12  . Linaclotide (LINZESS) 145 MCG CAPS capsule Take 1 capsule (145 mcg total) by mouth daily.  30 capsule  5  . losartan (COZAAR) 100 MG tablet Take 100 mg by mouth daily.      . metoprolol tartrate (LOPRESSOR) 25 MG tablet Take 1 tablet (25 mg total) by mouth every morning.  60 tablet  3  . nitroGLYCERIN (NITROSTAT) 0.4 MG SL tablet Place 1 tablet (0.4 mg total) under the tongue every 5 (five) minutes as needed for chest pain. 1 tablet under tongue at onset of chest pain;you may repeat every 5 minutes for up to 3 doses.  25 tablet  3  . Oxcarbazepine (TRILEPTAL) 300 MG tablet Take 2 in the morning, 1 at night      . oxybutynin (DITROPAN) 5 MG tablet Take 5 mg by mouth 2 (two) times daily.       . pravastatin (PRAVACHOL) 40 MG tablet Take 40 mg by mouth daily. Daily at bedtime      . Tamsulosin HCl (FLOMAX) 0.4 MG CAPS Take 0.4 mg by mouth at bedtime.       Marland Kitchen tiotropium (SPIRIVA HANDIHALER) 18 MCG inhalation capsule Place 1  capsule (18 mcg total) into inhaler and inhale daily.  30 capsule  6  . VITAMIN B1-B12 IM Inject into the muscle every 30 (thirty) days. Once a month       No current facility-administered medications for this visit.    Physical Exam: Filed Vitals:   08/05/13 0954  BP: 116/53  Pulse: 65  Height: 5' 11.5" (1.816 m)  Weight: 171 lb 12.8 oz (77.928 kg)    GEN- The patient is elderly appearing, alert and oriented x 3 today.   Head- normocephalic, atraumatic Eyes-  Sclera clear, conjunctiva pink Ears- hearing intact Oropharynx- clear Lungs- Clear to ausculation bilaterally, normal work of breathing Chest- pacemaker pocket is well healed Heart- Regular rate and rhythm, no murmurs, rubs or gallops, PMI not laterally displaced GI- soft, NT, ND, + BS Extremities- no clubbing, cyanosis, or edema  Pacemaker interrogation- reviewed in detail today,  See PACEART report  Assessment and Plan:  1. Sick sinus syndrome Normal pacemaker  function See Pace Art report No changes today  2. htn Stable No change required today  3. Cad Stable No change required today  Remote monitoring with merlin Return to the device clinic in 1 year.  Could be seen by Dr Lovena Le in [place] if patient prefers.

## 2013-08-12 ENCOUNTER — Telehealth: Payer: Self-pay | Admitting: Cardiology

## 2013-08-12 MED ORDER — LOSARTAN POTASSIUM 100 MG PO TABS: 100.0000 mg | ORAL_TABLET | Freq: Every day | ORAL | Status: DC

## 2013-08-12 NOTE — Telephone Encounter (Signed)
Received fax refill request  Rx # [id number] Medication:  Losartan 100 mg tablet Qty 30 Sig:  Take one tablet by mouth once a day Physician:  Domenic Polite

## 2013-08-24 ENCOUNTER — Encounter: Payer: Self-pay | Admitting: Family

## 2013-08-25 ENCOUNTER — Ambulatory Visit (INDEPENDENT_AMBULATORY_CARE_PROVIDER_SITE_OTHER)
Admission: RE | Admit: 2013-08-25 | Discharge: 2013-08-25 | Disposition: A | Discharge status: Home / Self Care | Payer: Medicare Other | Source: Ambulatory Visit | Attending: Family | Admitting: Family

## 2013-08-25 ENCOUNTER — Ambulatory Visit (INDEPENDENT_AMBULATORY_CARE_PROVIDER_SITE_OTHER): Payer: Medicare Other | Admitting: Family

## 2013-08-25 ENCOUNTER — Ambulatory Visit (HOSPITAL_COMMUNITY)
Admission: RE | Admit: 2013-08-25 | Discharge: 2013-08-25 | Disposition: A | Discharge status: Home / Self Care | Payer: Medicare Other | Source: Ambulatory Visit | Attending: Family | Admitting: Family

## 2013-08-25 ENCOUNTER — Encounter: Payer: Self-pay | Admitting: Family

## 2013-08-25 VITALS — BP 133/73 | HR 72 | Resp 16 | Ht 71.5 in | Wt 177.0 lb

## 2013-08-25 DIAGNOSIS — I714 Abdominal aortic aneurysm, without rupture, unspecified: Secondary | ICD-10-CM

## 2013-08-25 DIAGNOSIS — Z48812 Encounter for surgical aftercare following surgery on the circulatory system: Secondary | ICD-10-CM

## 2013-08-25 DIAGNOSIS — I2510 Atherosclerotic heart disease of native coronary artery without angina pectoris: Secondary | ICD-10-CM

## 2013-08-25 DIAGNOSIS — I70219 Atherosclerosis of native arteries of extremities with intermittent claudication, unspecified extremity: Secondary | ICD-10-CM

## 2013-08-25 DIAGNOSIS — I739 Peripheral vascular disease, unspecified: Secondary | ICD-10-CM | POA: Insufficient documentation

## 2013-08-25 NOTE — Patient Instructions (Signed)
Abdominal Aortic Aneurysm An aneurysm is a weakened or damaged part of an artery wall that bulges from the normal force of blood pumping through the body. An abdominal aortic aneurysm is an aneurysm that occurs in the lower part of the aorta, the main artery of the body.  The major concern with an abdominal aortic aneurysm is that it can enlarge and burst (rupture) or blood can flow between the layers of the wall of the aorta through a tear (aorticdissection). Both of these conditions can cause bleeding inside the body and can be life threatening unless diagnosed and treated promptly. CAUSES  The exact cause of an abdominal aortic aneurysm is unknown. Some contributing factors are:   A hardening of the arteries caused by the buildup of fat and other substances in the lining of a blood vessel (arteriosclerosis).  Inflammation of the walls of an artery (arteritis).   Connective tissue diseases, such as Marfan syndrome.   Abdominal trauma.   An infection, such as syphilis or staphylococcus, in the wall of the aorta (infectious aortitis) caused by bacteria. RISK FACTORS  Risk factors that contribute to an abdominal aortic aneurysm may include:  Age older than 60 years.   High blood pressure (hypertension).  Male gender.  Ethnicity (white race).  Obesity.  Family history of aneurysm (first degree relatives only).  Tobacco use. PREVENTION  The following healthy lifestyle habits may help decrease your risk of abdominal aortic aneurysm:  Quitting smoking. Smoking can raise your blood pressure and cause arteriosclerosis.  Limiting or avoiding alcohol.  Keeping your blood pressure, blood sugar level, and cholesterol levels within normal limits.  Decreasing your salt intake. In somepeople, too much salt can raise blood pressure and increase your risk of abdominal aortic aneurysm.  Eating a diet low in saturated fats and cholesterol.  Increasing your fiber intake by including  whole grains, vegetables, and fruits in your diet. Eating these foods may help lower blood pressure.  Maintaining a healthy weight.  Staying physically active and exercising regularly. SYMPTOMS  The symptoms of abdominal aortic aneurysm may vary depending on the size and rate of growth of the aneurysm.Most grow slowly and do not have any symptoms. When symptoms do occur, they may include:  Pain (abdomen, side, lower back, or groin). The pain may vary in intensity. A sudden onset of severe pain may indicate that the aneurysm has ruptured.  Feeling full after eating only small amounts of food.  Nausea or vomiting or both.  Feeling a pulsating lump in the abdomen.  Feeling faint or passing out. DIAGNOSIS  Since most unruptured abdominal aortic aneurysms have no symptoms, they are often discovered during diagnostic exams for other conditions. An aneurysm may be found during the following procedures:  Ultrasonography (A one-time screening for abdominal aortic aneurysm by ultrasonography is also recommended for all men aged 65-75 years who have ever smoked).  X-ray exams.  A computed tomography (CT).  Magnetic resonance imaging (MRI).  Angiography or arteriography. TREATMENT  Treatment of an abdominal aortic aneurysm depends on the size of your aneurysm, your age, and risk factors for rupture. Medication to control blood pressure and pain may be used to manage aneurysms smaller than 6 cm. Regular monitoring for enlargement may be recommended by your caregiver if:  The aneurysm is 3-4 cm in size (an annual ultrasonography may be recommended).  The aneurysm is 4-4.5 cm in size (an ultrasonography every 6 months may be recommended).  The aneurysm is larger than 4.5 cm in   size (your caregiver may ask that you be examined by a vascular surgeon). If your aneurysm is larger than 6 cm, surgical repair may be recommended. There are two main methods for repair of an aneurysm:   Endovascular  repair (a minimally invasive surgery). This is done most often.  Open repair. This method is used if an endovascular repair is not possible. Document Released: 10/11/2004 Document Revised: 04/28/2012 Document Reviewed: 02/01/2012 Exit[employer] Patient Information 2015 [employer]. This information is not intended to replace advice given to you by your health care provider. Make sure you discuss any questions you have with your health care provider.  

## 2013-08-25 NOTE — Progress Notes (Signed)
[facility]  Established Abdominal Aortic Aneurysm  History of Present Illness  [name] is a 80 y.o. (01-25-1921) male patient of Dr. Donnetta Hutching who presents with chief complaint: follow up for AAA.  The patient does not have abdominal pain, does have chronic lumbar spine issues. The patient is not a smoker.  States he cannot walk much any more, used to walk several miles daily, walks across his house and both thighs and calves feel tight, relieved by rest. His legs feel weak when he stands to shave.  He also states that his breathing limits his walking.  The patient has history of stroke in 2005 and also TIA symptoms, last one was a year ago as manifested by twisting of mouth, transient expressive aphasia.  He had a carotid Duplex in July of 2014 which indicated < 50% bilateral ICA stenoses.  Pt Diabetic: No  Pt smoker: former smoker, quit 20 years ago   Past Medical History  Diagnosis Date  . Gout   . Stroke 1993, 1995    chronic balance issues  . Hyperlipidemia   . Emphysema   . Hypertension   . CAD (coronary artery disease)     stent 2006  . Seizure disorder   . GERD (gastroesophageal reflux disease)   . Cancer   . Asthma   . Syncope Jan. 1, 2014  . AAA (abdominal aortic aneurysm)   . Sinus node dysfunction     s/p PPM implant April 2015 ([facility])   Past Surgical History  Procedure Laterality Date  . Cardiac catheterization  08/23/2004    bare metal stenting of the proximal circumflex  . Colonoscopy      X 2  . Colonoscopy  11/01/2011    Procedure: COLONOSCOPY;  Surgeon: Rogene Houston, MD;  Location: [facility];  Service: Endoscopy;  Laterality: N/A;  100  . Eye surgery    . Nose surgery    . Pacemaker insertion  04-29-13    STJ Assurity dual chamber pacemaker implanted by Dr Rayann Heman for syncope and sinus node dysfunction   Social History History   Social History  . Marital Status: Widowed    Spouse Name: N/A    Number of  Children: 2  . Years of Education: N/A   Occupational History  . Retired Multimedia programmer   Social History Main Topics  . Smoking status: Former Smoker -- 0.30 packs/day for 50 years    Types: Cigarettes    Quit date: 01/15/1990  . Smokeless tobacco: Never Used  . Alcohol Use: No  . Drug Use: No  . Sexual Activity: Not on file   Other Topics Concern  . Not on file   Social History Narrative  . No narrative on file   Family History Family History  Problem Relation Age of Onset  . Emphysema Brother   . Heart disease Sister   . Cancer Brother     lung  . Diabetes Brother   . Cancer Brother     throat  . Diabetes Brother   . Rheum arthritis Mother   . Arthritis Mother   . Rheum arthritis Father   . Colon cancer Neg Hx   . Diabetes      Current Outpatient Prescriptions on File Prior to Visit  Medication Sig Dispense Refill  . allopurinol (ZYLOPRIM) 300 MG tablet Take 300 mg by mouth every morning.       . clopidogrel (PLAVIX) 75  MG tablet Take 75 mg by mouth every morning.       . donepezil (ARICEPT) 10 MG tablet Take 10 mg by mouth at bedtime.       . famotidine (PEPCID) 20 MG tablet TAKE ONE TABLET BY MOUTH TWICE DAILY.  60 tablet  5  . furosemide (LASIX) 40 MG tablet Take 0.5 tablets (20 mg total) by mouth daily.  30 tablet  12  . Linaclotide (LINZESS) 145 MCG CAPS capsule Take 1 capsule (145 mcg total) by mouth daily.  30 capsule  5  . losartan (COZAAR) 100 MG tablet Take 1 tablet (100 mg total) by mouth daily.  30 tablet  11  . metoprolol tartrate (LOPRESSOR) 25 MG tablet Take 1 tablet (25 mg total) by mouth every morning.  60 tablet  3  . nitroGLYCERIN (NITROSTAT) 0.4 MG SL tablet Place 1 tablet (0.4 mg total) under the tongue every 5 (five) minutes as needed for chest pain. 1 tablet under tongue at onset of chest pain;you may repeat every 5 minutes for up to 3 doses.  25 tablet  3  . Oxcarbazepine (TRILEPTAL) 300 MG tablet Take 1 in the morning, 1 at night       . oxybutynin (DITROPAN) 5 MG tablet Take 5 mg by mouth 2 (two) times daily.       . pravastatin (PRAVACHOL) 40 MG tablet Take 40 mg by mouth daily. Daily at bedtime      . Tamsulosin HCl (FLOMAX) 0.4 MG CAPS Take 0.4 mg by mouth at bedtime.       Marland Kitchen tiotropium (SPIRIVA HANDIHALER) 18 MCG inhalation capsule Place 1 capsule (18 mcg total) into inhaler and inhale daily.  30 capsule  6  . VITAMIN B1-B12 IM Inject into the muscle every 30 (thirty) days. Once a month       No current facility-administered medications on file prior to visit.   Allergies  Allergen Reactions  . Penicillins Itching    ROS: See HPI for pertinent positives and negatives.  Physical Examination  Filed Vitals:   08/25/13 1053  BP: 133/73  Pulse: 72  Resp: 16  Height: 5' 11.5" (1.816 m)  Weight: 177 lb (80.287 kg)  SpO2: 96%   Body mass index is 24.35 kg/(m^2).  General: A&O x 3, WD.  Pulmonary: Sym exp, good air movt, CTAB, no rales, rhonchi, or wheezing.  Cardiac: RRR, Nl S1, S2, no Murmur.  Carotid Bruits  Left  Right    Negative  Negative   Aorta is palpable  Radial pulses are 2 + and =  VASCULAR EXAM:  LE Pulses  LEFT  RIGHT   FEMORAL  palpable  palpable   POPLITEAL  not palpable  not palpable   POSTERIOR TIBIAL  not palpable  not palpable   DORSALIS PEDIS  ANTERIOR TIBIAL  palpable  palpable    Gastrointestinal: soft, NTND, -G/R, - HSM, - masses, - CVAT B.  Musculoskeletal: M/S 5/5 throughout , Extremities without ischemic changes.  Neurologic: CN 2-12 intact except is slightly hard of hearing, Pain and light touch intact in extremities, Motor exam as listed above.  July, 2014 Carotid Duplex:  Moderate atherosclerotic disease involving the carotid arteries  bilaterally. Estimated degree of narrowing in the internal carotid  arteries is less than 50% bilaterally.   Non-Invasive Vascular Imaging  AAA Duplex (08/25/2013) ABDOMINAL AORTA DUPLEX EVALUATION    INDICATION: Follow-up abdominal  aortic aneurysm     PREVIOUS INTERVENTION(S):     DUPLEX EXAM:  LOCATION DIAMETER AP (cm) DIAMETER TRANSVERSE (cm) VELOCITIES (cm/sec)  Aorta Proximal 4.5  104  Aorta Mid 2.9 2.9 46  Aorta Distal 4.69 4.72 51  Right Common Iliac Artery 2.09  205  Left Common Iliac Artery   228    Previous max aortic diameter:  4.63cm x 4.65cm Date: 02/17/2013     ADDITIONAL FINDINGS: Technically difficult study due to bilobed nature of aneurysm and oblique angles of insonation of the aorta due to tortuosity.    IMPRESSION: 1. Stable measurements of the bilobed abdominal aortic aneurysm which appears to extend from the diaphragm to the iliac arteries. 2. 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    Compared to the previous exam:  The right common iliac artery measurement has increased compared to previous exam.   ABI's: Non compressible vessels bilaterally with triphasic waveforms, normal TBI's.  Medical Decision Making  The patient is a 81 y.o. male who presents with asymptomatic AAA with no increase in size. ABI's indicate no evidence of lower extremity arterial occlusive disease.  Carotid Duplex done a year ago about the time he had his last TIA revealed <50% bilateral ICA stenoses. Fortunately he stopped smoking over 20 years ago and is not diabetic.   Based on this patient's exam and diagnostic studies, the patient will follow up in 6 months  with the following studies: AAA Duplex.  Consideration for repair of AAA would be made when the size is 5.5 cm, growth > 1 cm/yr, and symptomatic status.  I emphasized the importance of maximal medical management including strict control of blood pressure, blood glucose, and lipid levels, antiplatelet agents, obtaining regular exercise, and continued cessation of smoking.   The patient was given information about AAA including signs,  symptoms, treatment, and how to minimize the risk of enlargement and rupture of aneurysms.    The patient was advised to call 911 should the patient experience sudden onset abdominal or back pain.   Thank you for allowing [place] to participate in this patient's care.  Clemon Chambers, RN, MSN, FNP-C [facility] Office: [phone]  Clinic Physician: Early  08/25/2013, 11:12 AM

## 2013-08-26 ENCOUNTER — Encounter: Payer: Self-pay | Admitting: Physician Assistant

## 2013-08-26 ENCOUNTER — Ambulatory Visit (INDEPENDENT_AMBULATORY_CARE_PROVIDER_SITE_OTHER): Payer: Medicare Other | Admitting: Physician Assistant

## 2013-08-26 VITALS — BP 118/65 | HR 60 | Ht 71.5 in | Wt 178.0 lb

## 2013-08-26 DIAGNOSIS — R079 Chest pain, unspecified: Secondary | ICD-10-CM

## 2013-08-26 DIAGNOSIS — N189 Chronic kidney disease, unspecified: Secondary | ICD-10-CM

## 2013-08-26 DIAGNOSIS — Z950 Presence of cardiac pacemaker: Secondary | ICD-10-CM

## 2013-08-26 DIAGNOSIS — I5032 Chronic diastolic (congestive) heart failure: Secondary | ICD-10-CM

## 2013-08-26 DIAGNOSIS — I2510 Atherosclerotic heart disease of native coronary artery without angina pectoris: Secondary | ICD-10-CM

## 2013-08-26 DIAGNOSIS — I714 Abdominal aortic aneurysm, without rupture, unspecified: Secondary | ICD-10-CM

## 2013-08-26 DIAGNOSIS — I10 Essential (primary) hypertension: Secondary | ICD-10-CM

## 2013-08-26 DIAGNOSIS — E785 Hyperlipidemia, unspecified: Secondary | ICD-10-CM

## 2013-08-26 MED ORDER — LOSARTAN POTASSIUM 50 MG PO TABS: 50.0000 mg | ORAL_TABLET | Freq: Every day | ORAL | Status: DC

## 2013-08-26 MED ORDER — ISOSORBIDE MONONITRATE ER 30 MG PO TB24: 15.0000 mg | ORAL_TABLET | Freq: Every day | ORAL | Status: AC

## 2013-08-26 MED ORDER — FUROSEMIDE 40 MG PO TABS: 40.0000 mg | ORAL_TABLET | ORAL | Status: DC

## 2013-08-26 NOTE — Patient Instructions (Signed)
START IMDUR 15 MG DAILY; RX SENT IN TODAY  INCREASE LASIX TO 40 MG EVERY OTHER ALTERNATE WITH 20 MG EVERY OTHER DAY  DECREASE LOSARTAN TO 50 MG DAILY; NEW RX SENT IN TODAY  Your physician recommends that you schedule a follow-up appointment in: 3-4 WEEKS WITH DR.[place]

## 2013-08-26 NOTE — Progress Notes (Signed)
Cardiology Office Note    Date:  08/26/2013   ID:  [name], DOB 1921/04/01, MRN [id number]  PCP:  Asencion Noble, MD  Cardiologist:  Dr. Carlyle Dolly  [facility]) Electrophysiologist:  Dr. Thompson Grayer    History of Present Illness: [name] is a 80 y.o. male with a hx of SSS s/p PPM 04/2013, CAD s/p PCI in 2006 and known RCA and CFX CTO with R-L and L-L collats (2010), AAA followed by Dr. Donnetta Hutching, HTN, HL, CKD, diastolic CHF.  Last seen by Dr. Thompson Grayer in 07/2013.  He had some postural dizziness and his Lasix was adjusted.  He returns for FU.    Patient denies any change in his symptoms since decreasing the dose of his Lasix. He has chronic dyspnea with exertion that he attributes to COPD. He is NYHA 2b-3.  He denies significant change. He denies orthopnea, PND. He does note slight increase in LE edema. His weights are increased since last visit.  Over the last several weeks, he has started to note chest discomfort. He describes this as a pressure. He denies radiating symptoms. He does have associated dyspnea. He denies associated nausea or diaphoresis. Symptoms typically occur at rest. He has had some symptoms with changes in positioning. He has taken nitroglycerin with relief.   Studies:  - LHC (2010):  Ostial RCA occl with L-R collats, LM 30, ostial RI 60-70, ostial CFX occl at stent and L-L collats, prox LAD 50-60 >>> med rx  - Echo (4/15):  Severe LVH, mild focal basal hypertrophy of the septum, EF 55-60%, no RWMA, trivial AI, mild MR, mild LAE, small eff  - Carotid US (7/14):  < 50% bilat ICA   Recent Labs/Images: 04/17/2013: Pro B Natriuretic peptide (BNP) 3970.0*  04/28/2013: ALT 12; TSH 1.740  04/29/2013: Hemoglobin 11.1*  04/30/2013: Creatinine 2.77*; Potassium 4.1   No results found.   Wt Readings from Last 3 Encounters:  08/26/13 178 lb (80.74 kg)  08/25/13 177 lb (80.287 kg)  08/05/13 171 lb 12.8 oz (77.928 kg)     Past Medical History  Diagnosis Date    . Gout   . Stroke 1993, 1995    chronic balance issues  . Hyperlipidemia   . Emphysema   . Hypertension   . CAD (coronary artery disease)     stent 2006  . Seizure disorder   . GERD (gastroesophageal reflux disease)   . Cancer   . Asthma   . Syncope Jan. 1, 2014  . AAA (abdominal aortic aneurysm)   . Sinus node dysfunction     s/p PPM implant April 2015 (ST Jude)    Current Outpatient Prescriptions  Medication Sig Dispense Refill  . allopurinol (ZYLOPRIM) 300 MG tablet Take 300 mg by mouth every morning.       . clopidogrel (PLAVIX) 75 MG tablet Take 75 mg by mouth every morning.       . donepezil (ARICEPT) 10 MG tablet Take 10 mg by mouth at bedtime.       . famotidine (PEPCID) 20 MG tablet TAKE ONE TABLET BY MOUTH TWICE DAILY.  60 tablet  5  . furosemide (LASIX) 40 MG tablet Take 0.5 tablets (20 mg total) by mouth daily.  30 tablet  12  . Linaclotide (LINZESS) 145 MCG CAPS capsule Take 1 capsule (145 mcg total) by mouth daily.  30 capsule  5  . losartan (COZAAR) 100 MG tablet Take 1 tablet (100 mg total) by mouth daily.  30 tablet  11  . metoprolol tartrate (LOPRESSOR) 25 MG tablet Take 1 tablet (25 mg total) by mouth every morning.  60 tablet  3  . nitroGLYCERIN (NITROSTAT) 0.4 MG SL tablet Place 1 tablet (0.4 mg total) under the tongue every 5 (five) minutes as needed for chest pain. 1 tablet under tongue at onset of chest pain;you may repeat every 5 minutes for up to 3 doses.  25 tablet  3  . Oxcarbazepine (TRILEPTAL) 300 MG tablet Take 1 in the morning, 1 at night      . oxybutynin (DITROPAN) 5 MG tablet Take 5 mg by mouth 2 (two) times daily.       . pravastatin (PRAVACHOL) 40 MG tablet Take 40 mg by mouth daily. Daily at bedtime      . Tamsulosin HCl (FLOMAX) 0.4 MG CAPS Take 0.4 mg by mouth at bedtime.       Marland Kitchen tiotropium (SPIRIVA HANDIHALER) 18 MCG inhalation capsule Place 1 capsule (18 mcg total) into inhaler and inhale daily.  30 capsule  6  . VITAMIN B1-B12 IM Inject  into the muscle every 30 (thirty) days. Once a month       No current facility-administered medications for this visit.     Allergies:   Penicillins   Social History:  The patient  reports that he quit smoking about 23 years ago. His smoking use included Cigarettes. He has a 15 pack-year smoking history. He has never used smokeless tobacco. He reports that he does not drink alcohol or use illicit drugs.   Family History:  The patient's family history includes Arthritis in his mother; Cancer in his brother and brother; Diabetes in his brother, brother and another family member; Emphysema in his brother; Heart attack in his sister and another family member; Heart disease in his sister; Rheum arthritis in his father and mother; Stroke in his mother. There is no history of Colon cancer.   ROS:  Please see the history of present illness.   He notes chronic constipation.   All other systems reviewed and negative.   PHYSICAL EXAM: VS:  BP 118/65  Pulse 60  Ht 5' 11.5" (1.816 m)  Wt 178 lb (80.74 kg)  BMI 24.48 kg/m2 Well nourished, well developed, in no acute distress HEENT: normal Neck: + JVD Cardiac:  normal S1, S2; RRR; no murmur Lungs:  Decreased breath sounds bilaterally, no wheezing, rhonchi or rales Abd: soft, nontender, no hepatomegaly Ext: trace bilateral LE edema Skin: warm and dry Neuro:  CNs 2-12 intact, no focal abnormalities noted  EKG:  NSR, HR 60, LAD, anteroseptal Q waves, lateral T wave inversions, no significant change when compared to prior tracings     ASSESSMENT AND PLAN:  Chest pain, unspecified:  Patient notes symptoms of chest pain over the last several weeks. He cannot remember what his previous angina was like. He does have a history of chronically occluded RCA and circumflex with collaterals. With his advanced age and advanced kidney disease, he is not a candidate for aggressive invasive evaluation such as cardiac catheterization. I reviewed this with the  patient today and he and his son both agreed. I have suggested that we try long-acting nitrates to advance medical therapy. I will start him on isosorbide 15 mg daily. Continue Plavix, beta blocker, statin.  I will have him followup with Dr. Harl Bowie in the next month to reassess his symptoms.  Coronary artery disease:  Add isosorbide as noted above. Continue beta blocker, Plavix, statin.  Chronic diastolic heart failure:  He has chronic dyspnea. His weight has increased since last seen. He does have some mildly increased pedal edema. There has been no significant change in his orthostatic intolerance. I have suggested that we resume a higher dose of Lasix at 40 mg every other day alternating with 20 mg.  CKD (chronic kidney disease), unspecified stage:  Baseline creatinine 2.7-2.8.  Unspecified essential hypertension:  I will decrease his losartan to 50 mg daily to allow more blood pressure to use long-acting nitrates.  Cardiac pacemaker in situ:  Follow up with EP as planned  AAA (abdominal aortic aneurysm):    He is followed by vascular surgery.  Hyperlipidemia:  Continue statin.   Disposition:  FU Dr. Carlyle Dolly in [place] in 3-4 weeks.    Signed, Versie Starks, MHS 08/26/2013 11:37 AM    [facility] [address], [place], [place]  [address] Phone: [phone]; Fax: [phone]

## 2013-09-17 ENCOUNTER — Ambulatory Visit (HOSPITAL_COMMUNITY)
Admission: RE | Admit: 2013-09-17 | Discharge: 2013-09-17 | Disposition: A | Discharge status: Home / Self Care | Payer: Medicare Other | Source: Ambulatory Visit | Attending: Internal Medicine | Admitting: Internal Medicine

## 2013-09-17 ENCOUNTER — Other Ambulatory Visit (HOSPITAL_COMMUNITY): Payer: Self-pay | Admitting: Internal Medicine

## 2013-09-17 DIAGNOSIS — R042 Hemoptysis: Secondary | ICD-10-CM | POA: Diagnosis not present

## 2013-09-17 DIAGNOSIS — J90 Pleural effusion, not elsewhere classified: Secondary | ICD-10-CM | POA: Diagnosis not present

## 2013-09-17 DIAGNOSIS — R059 Cough, unspecified: Secondary | ICD-10-CM

## 2013-09-17 DIAGNOSIS — R05 Cough: Secondary | ICD-10-CM

## 2013-09-18 ENCOUNTER — Other Ambulatory Visit (HOSPITAL_COMMUNITY): Payer: Self-pay | Admitting: Internal Medicine

## 2013-09-18 DIAGNOSIS — R042 Hemoptysis: Secondary | ICD-10-CM

## 2013-09-23 ENCOUNTER — Ambulatory Visit (HOSPITAL_COMMUNITY)
Admission: RE | Admit: 2013-09-23 | Discharge: 2013-09-23 | Disposition: A | Discharge status: Home / Self Care | Payer: Medicare Other | Source: Ambulatory Visit | Attending: Internal Medicine | Admitting: Internal Medicine

## 2013-09-23 ENCOUNTER — Ambulatory Visit (INDEPENDENT_AMBULATORY_CARE_PROVIDER_SITE_OTHER): Payer: Medicare Other | Admitting: Cardiology

## 2013-09-23 ENCOUNTER — Encounter: Payer: Self-pay | Admitting: Cardiology

## 2013-09-23 VITALS — BP 122/72 | HR 52 | Ht 71.0 in | Wt 179.0 lb

## 2013-09-23 DIAGNOSIS — R042 Hemoptysis: Secondary | ICD-10-CM | POA: Diagnosis present

## 2013-09-23 DIAGNOSIS — I517 Cardiomegaly: Secondary | ICD-10-CM | POA: Insufficient documentation

## 2013-09-23 DIAGNOSIS — J90 Pleural effusion, not elsewhere classified: Secondary | ICD-10-CM | POA: Insufficient documentation

## 2013-09-23 DIAGNOSIS — N189 Chronic kidney disease, unspecified: Secondary | ICD-10-CM

## 2013-09-23 DIAGNOSIS — E785 Hyperlipidemia, unspecified: Secondary | ICD-10-CM

## 2013-09-23 DIAGNOSIS — I2510 Atherosclerotic heart disease of native coronary artery without angina pectoris: Secondary | ICD-10-CM

## 2013-09-23 DIAGNOSIS — R911 Solitary pulmonary nodule: Secondary | ICD-10-CM | POA: Insufficient documentation

## 2013-09-23 DIAGNOSIS — R079 Chest pain, unspecified: Secondary | ICD-10-CM

## 2013-09-23 DIAGNOSIS — I455 Other specified heart block: Secondary | ICD-10-CM

## 2013-09-23 MED ORDER — METOPROLOL TARTRATE 25 MG PO TABS: 12.5000 mg | ORAL_TABLET | Freq: Every morning | ORAL | Status: DC

## 2013-09-23 NOTE — Progress Notes (Signed)
.  zrr   

## 2013-09-23 NOTE — Progress Notes (Signed)
Clinical Summary [name] is a 80 y.o.male seen today for follow up of the following medical problems.  1. Syncope/Sinus Pauses  - 04/29/13 St Jude dual chamber pacemaker placed, device check 05/07/13 with normal function.  - denies any lightheadedness or dizziness. Has increased energy since device was placed. Family states last device check back up rate was decrased to 50, since that time has noted some decreased energy  2. CAD  - prior stent to RCA in 2006. Repeat cath 2010 with occluded LCX with left to left collaterals, LAD 50-60%. Medically managed  - from clinic notes 08/26/13 patient with some non-specific chest pain, long acting nitrates started. Focused on medical management due to advanced age and severe underlying CKD - he is on plavix for secondary prevention, the history I am unclear.  - symptoms remain stable. 7/10 pain mid to left chest worst with position. Can occur at rest.   3. Abdominal aortic aneurysm  - followed by Dr Early   4. HTN  - does not check bp regularly  - compliant with meds   5. Hyperlipidemia  - compliant with pravastatin  - reports recent panel by pcp   6. Chronic diastolic heart failure - appointment with PA Kathlen Mody last month noted some increased swelling, lasix increased to 40mg  alternating with 20mg  daily.  - last visit with pcp noted increase swelling, changed to lasix 40mg  daily  7. CKD stage IV - followed by pcp  8. DOE - reports increased symptoms over the last month. +orthopnea - uses O2 at night time and as needed - has had some increased LE edema, lasix was increased to 40mg  daily with some improvement - reports dietary indiscretions with increased salt - + cough with occasional blood tinged sputum   Past Medical History  Diagnosis Date  . Gout   . Stroke 1993, 1995    chronic balance issues  . Hyperlipidemia   . Emphysema   . Hypertension   . CAD (coronary artery disease)     stent 2006  . Seizure disorder   . GERD  (gastroesophageal reflux disease)   . Cancer   . Asthma   . Syncope Jan. 1, 2014  . AAA (abdominal aortic aneurysm)   . Sinus node dysfunction     s/p PPM implant April 2015 (ST Jude)     Allergies  Allergen Reactions  . Penicillins Itching     Current Outpatient Prescriptions  Medication Sig Dispense Refill  . allopurinol (ZYLOPRIM) 300 MG tablet Take 300 mg by mouth every morning.       . clopidogrel (PLAVIX) 75 MG tablet Take 75 mg by mouth every morning.       . donepezil (ARICEPT) 10 MG tablet Take 10 mg by mouth at bedtime.       . famotidine (PEPCID) 20 MG tablet TAKE ONE TABLET BY MOUTH TWICE DAILY.  60 tablet  5  . furosemide (LASIX) 40 MG tablet Take 1 tablet (40 mg total) by mouth as directed. ALTERNATE 40 MG EVERY OTHER DAY WITH 20 MG EVERY OTHER DAY  30 tablet  12  . isosorbide mononitrate (IMDUR) 30 MG 24 hr tablet Take 0.5 tablets (15 mg total) by mouth daily.  30 tablet  11  . Linaclotide (LINZESS) 145 MCG CAPS capsule Take 1 capsule (145 mcg total) by mouth daily.  30 capsule  5  . losartan (COZAAR) 50 MG tablet Take 1 tablet (50 mg total) by mouth daily.  30 tablet  11  . metoprolol tartrate (LOPRESSOR) 25 MG tablet Take 1 tablet (25 mg total) by mouth every morning.  60 tablet  3  . nitroGLYCERIN (NITROSTAT) 0.4 MG SL tablet Place 1 tablet (0.4 mg total) under the tongue every 5 (five) minutes as needed for chest pain. 1 tablet under tongue at onset of chest pain;you may repeat every 5 minutes for up to 3 doses.  25 tablet  3  . Oxcarbazepine (TRILEPTAL) 300 MG tablet Take 1 in the morning, 1 at night      . oxybutynin (DITROPAN) 5 MG tablet Take 5 mg by mouth 2 (two) times daily.       . pravastatin (PRAVACHOL) 40 MG tablet Take 40 mg by mouth daily. Daily at bedtime      . Tamsulosin HCl (FLOMAX) 0.4 MG CAPS Take 0.4 mg by mouth at bedtime.       Marland Kitchen tiotropium (SPIRIVA HANDIHALER) 18 MCG inhalation capsule Place 1 capsule (18 mcg total) into inhaler and inhale  daily.  30 capsule  6  . VITAMIN B1-B12 IM Inject into the muscle every 30 (thirty) days. Once a month       No current facility-administered medications for this visit.     Past Surgical History  Procedure Laterality Date  . Cardiac catheterization  08/23/2004    bare metal stenting of the proximal circumflex  . Colonoscopy      X 2  . Colonoscopy  11/01/2011    Procedure: COLONOSCOPY;  Surgeon: Rogene Houston, MD;  Location: [facility];  Service: Endoscopy;  Laterality: N/A;  100  . Eye surgery    . Nose surgery    . Pacemaker insertion  04-29-13    STJ Assurity dual chamber pacemaker implanted by Dr Rayann Heman for syncope and sinus node dysfunction     Allergies  Allergen Reactions  . Penicillins Itching      Family History  Problem Relation Age of Onset  . Emphysema Brother   . Heart disease Sister   . Cancer Brother     lung  . Diabetes Brother   . Cancer Brother     throat  . Diabetes Brother   . Rheum arthritis Mother   . Arthritis Mother   . Rheum arthritis Father   . Colon cancer Neg Hx   . Diabetes    . Stroke Mother   . Heart attack    . Heart attack Sister      Social History [name] reports that he quit smoking about 23 years ago. His smoking use included Cigarettes. He has a 15 pack-year smoking history. He has never used smokeless tobacco. [name] reports that he does not drink alcohol.   Review of Systems CONSTITUTIONAL: No weight loss, fever, chills, weakness or fatigue.  HEENT: Eyes: No visual loss, blurred vision, double vision or yellow sclerae.No hearing loss, sneezing, congestion, runny nose or sore throat.  SKIN: No rash or itching.  CARDIOVASCULAR: per HPI RESPIRATORY: per HPI GASTROINTESTINAL: No anorexia, nausea, vomiting or diarrhea. No abdominal pain or blood.  GENITOURINARY: No burning on urination, no polyuria NEUROLOGICAL: No headache, dizziness, syncope, paralysis, ataxia, numbness or tingling in the extremities. No  change in bowel or bladder control.  MUSCULOSKELETAL: No muscle, back pain, joint pain or stiffness.  LYMPHATICS: No enlarged nodes. No history of splenectomy.  PSYCHIATRIC: No history of depression or anxiety.  ENDOCRINOLOGIC: No reports of sweating, cold or heat intolerance. No polyuria or polydipsia.  Marland Kitchen  Physical Examination p 52 bp 122/72 Wt 179 lbs BMI 25 Gen: resting comfortably, no acute distress HEENT: no scleral icterus, pupils equal round and reactive, no palptable cervical adenopathy,  CV: regular, rate 55, no m/r/g, no JVD Resp: Clear to auscultation bilaterally GI: abdomen is soft, non-tender, non-distended, normal bowel sounds, no hepatosplenomegaly MSK: extremities are warm, no edema.  Skin: warm, no rash Neuro:  no focal deficits Psych: appropriate affect   Diagnostic Studies 04/2013 Echo  Study Conclusions  - Left ventricle: The cavity size was normal. Wall thickness was increased in a pattern of severe LVH. There was mild focal basal hypertrophy of the septum. Systolic function was normal. The estimated ejection fraction was in the range of 55% to 60%. Wall motion was normal; there were no regional wall motion abnormalities. Doppler parameters are consistent with restrictive physiology, indicative of decreased left ventricular diastolic compliance and/or increased left atrial pressure. - Aortic valve: Trivial regurgitation. - Mitral valve: Mild regurgitation. - Left atrium: The atrium was mildly dilated. - Pericardium, extracardiac: A small pericardial effusion was identified.  Cath 2010  FINDINGS:  1. Hemodynamics: LV 127/12, aorta 130/58.  2. RCA: The right coronary artery was totally occluded at the ostium.  There were some left-to-right collaterals. These collaterals were  not robust.  3. Left main: There was about a 30% distal left main stenosis.  4. Left circumflex system: There was a small-to-moderate ramus  intermedius with about 60-70% ostial  stenosis. The circumflex  itself had a long total occlusion and it extended from the ostium  to just prior to the first obtuse marginal. This was at the site  of a prior placed stent. The patient did have good collaterals  from the LAD to the distal circumflex and three small-to-moderate  obtuse marginals filled.  5. LAD system: There was a 50-60% proximal LAD stenosis just after  the first diagonal. The remainder of the LAD had luminal  irregularities.  6. Left ventriculography was not done due to chronic renal  insufficiency.  IMPRESSION: This is an 80 year old with progressive dyspnea on exertion  who presented for evaluation of his known coronary artery disease. The  patient's right coronary artery was totally occluded at the ostium.  This is known from the past. He does have some left-to-right  collaterals; however, the collaterals are not robust. The new finding  is a totally occluded proximal circumflex extending from the ostium of  the circumflex to just before the first obtuse marginal. This is in the  site of a stent that appears to have been placed in 2006. The patient  does have very good left anterior descending artery to the left  circumflex collaterals and three obtuse marginals are filled. The left  anterior descending artery has a 50-60% proximal stenosis. I do not  think that this stenosis is flow limiting;  however, this could present problems over time with progression. At  this time, I think the best course is probably going to be medical  management as I do not think that the proximal left anterior descending  artery stenosis is significant to the point of needing  revascularization.     Assessment and Plan  1. Syncope/Sinus pause  - symptoms resolved after recent pacemaker placement  - continue to follow in device clinic  - notes some decrease in energy level after back up rate was decreased to 50, has f/u with device clinic soon, will defer to them possible  increasing backup rate.   2. CAD  -  atypical chest pain - he is on plavix instead of ASA for secondary prevention, this history is unclear, but plavix is fine to continue  - continue medical therapy  3. AAA  - continue to follow with vascular   4. HTN  - at goal, continue current meds   5. Hyperlipidemia  -continue statin  6. SOB -pcp had ordered a CT scan of chest, on results suggests possible RLL pneumonia, minimal pulmonary edema. - defer abx to primary, will not increase diuretics at this time       Arnoldo Lenis, M.D.

## 2013-09-23 NOTE — Patient Instructions (Addendum)
Your physician recommends that you schedule a follow-up appointment in: 3 months     Your physician has recommended you make the following change in your medication:      DECREASE Lopressor to 12.5 mg twice a day      Thank you for choosing [facility] !

## 2013-09-24 ENCOUNTER — Telehealth: Payer: Self-pay

## 2013-09-24 MED ORDER — METOPROLOL TARTRATE 25 MG PO TABS: 12.5000 mg | ORAL_TABLET | Freq: Two times a day (BID) | ORAL | Status: DC

## 2013-09-24 NOTE — Telephone Encounter (Signed)
Lopressor 12.5 mg bid was escribed to [place]  Pt's instructions reflect bid dose already

## 2013-09-24 NOTE — Telephone Encounter (Signed)
Message copied by Bernita Raisin on Thu Sep 24, 2013 12:23 PM ------      Message from: [place] F      Created: Thu Sep 24, 2013 10:30 AM       His Rx for lopressor should be 12.5mg  po bid instead of qday                  Zandra Abts MD ------

## 2013-10-18 ENCOUNTER — Inpatient Hospital Stay (HOSPITAL_COMMUNITY)
Admission: EM | Admit: 2013-10-18 | Discharge: 2013-10-28 | DRG: 064 | Disposition: A | Discharge status: Skilled Nursing Facility | Payer: Medicare Other | Attending: Internal Medicine | Admitting: Internal Medicine

## 2013-10-18 ENCOUNTER — Emergency Department (HOSPITAL_COMMUNITY): Payer: Medicare Other

## 2013-10-18 ENCOUNTER — Encounter (HOSPITAL_COMMUNITY): Payer: Self-pay | Admitting: Emergency Medicine

## 2013-10-18 DIAGNOSIS — I5032 Chronic diastolic (congestive) heart failure: Secondary | ICD-10-CM | POA: Diagnosis present

## 2013-10-18 DIAGNOSIS — Z801 Family history of malignant neoplasm of trachea, bronchus and lung: Secondary | ICD-10-CM | POA: Diagnosis not present

## 2013-10-18 DIAGNOSIS — Z9981 Dependence on supplemental oxygen: Secondary | ICD-10-CM

## 2013-10-18 DIAGNOSIS — Z8701 Personal history of pneumonia (recurrent): Secondary | ICD-10-CM

## 2013-10-18 DIAGNOSIS — D649 Anemia, unspecified: Secondary | ICD-10-CM

## 2013-10-18 DIAGNOSIS — M109 Gout, unspecified: Secondary | ICD-10-CM | POA: Diagnosis present

## 2013-10-18 DIAGNOSIS — K921 Melena: Secondary | ICD-10-CM

## 2013-10-18 DIAGNOSIS — Z8673 Personal history of transient ischemic attack (TIA), and cerebral infarction without residual deficits: Secondary | ICD-10-CM | POA: Diagnosis not present

## 2013-10-18 DIAGNOSIS — N170 Acute kidney failure with tubular necrosis: Secondary | ICD-10-CM | POA: Diagnosis present

## 2013-10-18 DIAGNOSIS — N184 Chronic kidney disease, stage 4 (severe): Secondary | ICD-10-CM | POA: Diagnosis present

## 2013-10-18 DIAGNOSIS — Z87891 Personal history of nicotine dependence: Secondary | ICD-10-CM

## 2013-10-18 DIAGNOSIS — D696 Thrombocytopenia, unspecified: Secondary | ICD-10-CM | POA: Diagnosis present

## 2013-10-18 DIAGNOSIS — Z825 Family history of asthma and other chronic lower respiratory diseases: Secondary | ICD-10-CM | POA: Diagnosis not present

## 2013-10-18 DIAGNOSIS — E871 Hypo-osmolality and hyponatremia: Secondary | ICD-10-CM | POA: Diagnosis present

## 2013-10-18 DIAGNOSIS — R4702 Dysphasia: Secondary | ICD-10-CM | POA: Diagnosis present

## 2013-10-18 DIAGNOSIS — E119 Type 2 diabetes mellitus without complications: Secondary | ICD-10-CM | POA: Diagnosis present

## 2013-10-18 DIAGNOSIS — R4701 Aphasia: Secondary | ICD-10-CM | POA: Diagnosis present

## 2013-10-18 DIAGNOSIS — Z8249 Family history of ischemic heart disease and other diseases of the circulatory system: Secondary | ICD-10-CM | POA: Diagnosis not present

## 2013-10-18 DIAGNOSIS — D473 Essential (hemorrhagic) thrombocythemia: Secondary | ICD-10-CM | POA: Diagnosis present

## 2013-10-18 DIAGNOSIS — E872 Acidosis: Secondary | ICD-10-CM | POA: Diagnosis present

## 2013-10-18 DIAGNOSIS — Z79899 Other long term (current) drug therapy: Secondary | ICD-10-CM | POA: Diagnosis not present

## 2013-10-18 DIAGNOSIS — I129 Hypertensive chronic kidney disease with stage 1 through stage 4 chronic kidney disease, or unspecified chronic kidney disease: Secondary | ICD-10-CM | POA: Diagnosis present

## 2013-10-18 DIAGNOSIS — Z950 Presence of cardiac pacemaker: Secondary | ICD-10-CM | POA: Diagnosis not present

## 2013-10-18 DIAGNOSIS — I639 Cerebral infarction, unspecified: Principal | ICD-10-CM | POA: Diagnosis present

## 2013-10-18 DIAGNOSIS — Z7902 Long term (current) use of antithrombotics/antiplatelets: Secondary | ICD-10-CM

## 2013-10-18 DIAGNOSIS — F0390 Unspecified dementia without behavioral disturbance: Secondary | ICD-10-CM | POA: Diagnosis present

## 2013-10-18 DIAGNOSIS — Z823 Family history of stroke: Secondary | ICD-10-CM

## 2013-10-18 DIAGNOSIS — Z66 Do not resuscitate: Secondary | ICD-10-CM | POA: Diagnosis present

## 2013-10-18 DIAGNOSIS — Z955 Presence of coronary angioplasty implant and graft: Secondary | ICD-10-CM | POA: Diagnosis not present

## 2013-10-18 DIAGNOSIS — I2510 Atherosclerotic heart disease of native coronary artery without angina pectoris: Secondary | ICD-10-CM | POA: Diagnosis present

## 2013-10-18 DIAGNOSIS — J189 Pneumonia, unspecified organism: Secondary | ICD-10-CM | POA: Diagnosis present

## 2013-10-18 DIAGNOSIS — Z833 Family history of diabetes mellitus: Secondary | ICD-10-CM | POA: Diagnosis not present

## 2013-10-18 DIAGNOSIS — G40909 Epilepsy, unspecified, not intractable, without status epilepticus: Secondary | ICD-10-CM | POA: Diagnosis present

## 2013-10-18 DIAGNOSIS — N179 Acute kidney failure, unspecified: Secondary | ICD-10-CM

## 2013-10-18 DIAGNOSIS — E785 Hyperlipidemia, unspecified: Secondary | ICD-10-CM | POA: Diagnosis present

## 2013-10-18 DIAGNOSIS — R042 Hemoptysis: Secondary | ICD-10-CM | POA: Diagnosis present

## 2013-10-18 DIAGNOSIS — D631 Anemia in chronic kidney disease: Secondary | ICD-10-CM | POA: Diagnosis present

## 2013-10-18 DIAGNOSIS — G629 Polyneuropathy, unspecified: Secondary | ICD-10-CM | POA: Diagnosis present

## 2013-10-18 DIAGNOSIS — I6381 Other cerebral infarction due to occlusion or stenosis of small artery: Secondary | ICD-10-CM | POA: Diagnosis present

## 2013-10-18 DIAGNOSIS — G458 Other transient cerebral ischemic attacks and related syndromes: Secondary | ICD-10-CM

## 2013-10-18 DIAGNOSIS — K219 Gastro-esophageal reflux disease without esophagitis: Secondary | ICD-10-CM | POA: Diagnosis present

## 2013-10-18 DIAGNOSIS — J45909 Unspecified asthma, uncomplicated: Secondary | ICD-10-CM | POA: Diagnosis present

## 2013-10-18 LAB — COMPREHENSIVE METABOLIC PANEL
ALBUMIN: 3.7 g/dL (ref 3.5–5.2)
ALT: 15 U/L (ref 0–53)
ANION GAP: 13 (ref 5–15)
AST: 18 U/L (ref 0–37)
Alkaline Phosphatase: 118 U/L — ABNORMAL HIGH (ref 39–117)
BILIRUBIN TOTAL: 0.4 mg/dL (ref 0.3–1.2)
BUN: 54 mg/dL — AB (ref 6–23)
CALCIUM: 9.2 mg/dL (ref 8.4–10.5)
CHLORIDE: 101 meq/L (ref 96–112)
CO2: 27 mEq/L (ref 19–32)
CREATININE: 5.29 mg/dL — AB (ref 0.50–1.35)
GFR calc Af Amer: 10 mL/min — ABNORMAL LOW (ref >90)
GFR calc non Af Amer: 9 mL/min — ABNORMAL LOW (ref >90)
Glucose, Bld: 122 mg/dL — ABNORMAL HIGH (ref 70–99)
Potassium: 4.2 mEq/L (ref 3.7–5.3)
Sodium: 141 mEq/L (ref 137–147)
Total Protein: 6.7 g/dL (ref 6.0–8.3)

## 2013-10-18 LAB — URINE MICROSCOPIC-ADD ON

## 2013-10-18 LAB — CBC WITH DIFFERENTIAL/PLATELET
BASOS PCT: 0 % (ref 0–1)
Basophils Absolute: 0 10*3/uL (ref 0.0–0.1)
Eosinophils Absolute: 0.2 10*3/uL (ref 0.0–0.7)
Eosinophils Relative: 4 % (ref 0–5)
HEMATOCRIT: 24.5 % — AB (ref 39.0–52.0)
HEMOGLOBIN: 8.3 g/dL — AB (ref 13.0–17.0)
Lymphocytes Relative: 23 % (ref 12–46)
Lymphs Abs: 1.3 10*3/uL (ref 0.7–4.0)
MCH: 34 pg (ref 26.0–34.0)
MCHC: 33.9 g/dL (ref 30.0–36.0)
MCV: 100.4 fL — ABNORMAL HIGH (ref 78.0–100.0)
MONO ABS: 0.5 10*3/uL (ref 0.1–1.0)
Monocytes Relative: 9 % (ref 3–12)
Neutro Abs: 3.7 10*3/uL (ref 1.7–7.7)
Neutrophils Relative %: 64 % (ref 43–77)
Platelets: 91 10*3/uL — ABNORMAL LOW (ref 150–400)
RBC: 2.44 MIL/uL — ABNORMAL LOW (ref 4.22–5.81)
RDW: 15.7 % — AB (ref 11.5–15.5)
WBC: 5.7 10*3/uL (ref 4.0–10.5)

## 2013-10-18 LAB — URINALYSIS, ROUTINE W REFLEX MICROSCOPIC
BILIRUBIN URINE: NEGATIVE
Glucose, UA: NEGATIVE mg/dL
Ketones, ur: NEGATIVE mg/dL
Leukocytes, UA: NEGATIVE
NITRITE: NEGATIVE
PH: 6 (ref 5.0–8.0)
Protein, ur: NEGATIVE mg/dL
SPECIFIC GRAVITY, URINE: 1.015 (ref 1.005–1.030)
UROBILINOGEN UA: 0.2 mg/dL (ref 0.0–1.0)

## 2013-10-18 LAB — TROPONIN I

## 2013-10-18 MED ORDER — ISOSORBIDE MONONITRATE ER 30 MG PO TB24
15.0000 mg | ORAL_TABLET | Freq: Every day | ORAL | Status: DC
  Administered 2013-10-19 – 2013-10-28 (×10): 15 mg via ORAL
  Filled 2013-10-18 (×10): qty 1

## 2013-10-18 MED ORDER — SODIUM CHLORIDE 0.9 % IV SOLN
INTRAVENOUS | Status: DC
  Administered 2013-10-18 – 2013-10-22 (×6): via INTRAVENOUS

## 2013-10-18 MED ORDER — ONDANSETRON HCL 4 MG PO TABS: 4.0000 mg | ORAL_TABLET | Freq: Four times a day (QID) | ORAL | Status: DC | PRN

## 2013-10-18 MED ORDER — SIMVASTATIN 20 MG PO TABS
20.0000 mg | ORAL_TABLET | Freq: Every day | ORAL | Status: DC
  Administered 2013-10-19 – 2013-10-27 (×9): 20 mg via ORAL
  Filled 2013-10-18 (×9): qty 1

## 2013-10-18 MED ORDER — DONEPEZIL HCL 5 MG PO TABS
10.0000 mg | ORAL_TABLET | Freq: Every day | ORAL | Status: DC
  Administered 2013-10-18 – 2013-10-27 (×10): 10 mg via ORAL
  Filled 2013-10-18 (×11): qty 2

## 2013-10-18 MED ORDER — ALLOPURINOL 300 MG PO TABS
300.0000 mg | ORAL_TABLET | Freq: Every morning | ORAL | Status: DC
  Administered 2013-10-19 – 2013-10-27 (×9): 300 mg via ORAL
  Filled 2013-10-18 (×9): qty 1

## 2013-10-18 MED ORDER — ACETAMINOPHEN 650 MG RE SUPP: 650.0000 mg | Freq: Four times a day (QID) | RECTAL | Status: DC | PRN

## 2013-10-18 MED ORDER — TIOTROPIUM BROMIDE MONOHYDRATE 18 MCG IN CAPS
18.0000 ug | ORAL_CAPSULE | Freq: Every day | RESPIRATORY_TRACT | Status: DC
  Administered 2013-10-19 – 2013-10-28 (×11): 18 ug via RESPIRATORY_TRACT
  Filled 2013-10-18: qty 5

## 2013-10-18 MED ORDER — ONDANSETRON HCL 4 MG/2ML IJ SOLN: 4.0000 mg | Freq: Four times a day (QID) | INTRAMUSCULAR | Status: DC | PRN

## 2013-10-18 MED ORDER — TAMSULOSIN HCL 0.4 MG PO CAPS
0.4000 mg | ORAL_CAPSULE | Freq: Every day | ORAL | Status: DC
  Administered 2013-10-18 – 2013-10-27 (×10): 0.4 mg via ORAL
  Filled 2013-10-18 (×11): qty 1

## 2013-10-18 MED ORDER — OXYBUTYNIN CHLORIDE 5 MG PO TABS
5.0000 mg | ORAL_TABLET | Freq: Two times a day (BID) | ORAL | Status: DC
  Administered 2013-10-18 – 2013-10-28 (×20): 5 mg via ORAL
  Filled 2013-10-18 (×21): qty 1

## 2013-10-18 MED ORDER — OXCARBAZEPINE 300 MG PO TABS
ORAL_TABLET | ORAL | Status: AC
  Filled 2013-10-18: qty 1

## 2013-10-18 MED ORDER — SODIUM CHLORIDE 0.9 % IJ SOLN
3.0000 mL | Freq: Two times a day (BID) | INTRAMUSCULAR | Status: DC
  Administered 2013-10-19 – 2013-10-27 (×14): 3 mL via INTRAVENOUS

## 2013-10-18 MED ORDER — OXCARBAZEPINE 300 MG PO TABS
300.0000 mg | ORAL_TABLET | Freq: Two times a day (BID) | ORAL | Status: DC
  Administered 2013-10-18 – 2013-10-28 (×19): 300 mg via ORAL
  Filled 2013-10-18 (×24): qty 1

## 2013-10-18 MED ORDER — CLOPIDOGREL BISULFATE 75 MG PO TABS
75.0000 mg | ORAL_TABLET | Freq: Every morning | ORAL | Status: DC
  Administered 2013-10-19 – 2013-10-28 (×10): 75 mg via ORAL
  Filled 2013-10-18 (×10): qty 1

## 2013-10-18 MED ORDER — FAMOTIDINE 20 MG PO TABS
20.0000 mg | ORAL_TABLET | Freq: Every day | ORAL | Status: DC
  Administered 2013-10-18 – 2013-10-28 (×11): 20 mg via ORAL
  Filled 2013-10-18 (×11): qty 1

## 2013-10-18 MED ORDER — LINACLOTIDE 145 MCG PO CAPS
145.0000 ug | ORAL_CAPSULE | Freq: Every day | ORAL | Status: DC
  Administered 2013-10-19 – 2013-10-28 (×10): 145 ug via ORAL
  Filled 2013-10-18 (×10): qty 1

## 2013-10-18 MED ORDER — NITROGLYCERIN 0.4 MG SL SUBL: 0.4000 mg | SUBLINGUAL_TABLET | SUBLINGUAL | Status: DC | PRN

## 2013-10-18 MED ORDER — ACETAMINOPHEN 325 MG PO TABS: 650.0000 mg | ORAL_TABLET | Freq: Four times a day (QID) | ORAL | Status: DC | PRN

## 2013-10-18 MED ORDER — METOPROLOL TARTRATE 25 MG PO TABS
12.5000 mg | ORAL_TABLET | Freq: Two times a day (BID) | ORAL | Status: DC
  Administered 2013-10-18 – 2013-10-28 (×20): 12.5 mg via ORAL
  Filled 2013-10-18 (×20): qty 1

## 2013-10-18 MED ORDER — TIOTROPIUM BROMIDE MONOHYDRATE 18 MCG IN CAPS
ORAL_CAPSULE | RESPIRATORY_TRACT | Status: AC
  Filled 2013-10-18: qty 5

## 2013-10-18 MED ORDER — DOCUSATE SODIUM 100 MG PO CAPS
100.0000 mg | ORAL_CAPSULE | Freq: Every day | ORAL | Status: DC | PRN
  Administered 2013-10-19 – 2013-10-23 (×3): 100 mg via ORAL
  Filled 2013-10-18 (×3): qty 1

## 2013-10-18 MED ORDER — ALUM & MAG HYDROXIDE-SIMETH 200-200-20 MG/5ML PO SUSP
30.0000 mL | Freq: Four times a day (QID) | ORAL | Status: DC | PRN
  Administered 2013-10-19: 30 mL via ORAL
  Filled 2013-10-18: qty 30

## 2013-10-18 NOTE — ED Notes (Signed)
Pt brought to ED by EMS. Per EMS pt's daughter in law called EMS and told them his right arm was hurting. Per EMS pt denies right arm pain but does admit to having speech slurring.

## 2013-10-18 NOTE — ED Notes (Signed)
Pt brought in by EMS. Pt states he believes he had a TIA last night. Pt states his right arm began to hurt and he felt like his speech was slurring. At this time, per pt family, pt sounds normal. Neurologically intact.

## 2013-10-18 NOTE — ED Notes (Signed)
Tried to give report to nurse receiving room 334; informed by nurse that she could not take report at 1711 and would call me back.

## 2013-10-18 NOTE — ED Provider Notes (Signed)
CSN: [id number]     Arrival date & time 10/18/13  1337 History   This chart was scribed for Debby Freiberg, MD, by Neta Ehlers, ED Scribe. This patient was seen in room APA02/APA02 and the patient's care was started at 1:47 PM.  First MD Initiated Contact with Patient 10/18/13 1342     Chief Complaint  Patient presents with  . Aphasia    The history is provided by the patient and a relative. No language interpreter was used.   HPI Comments: [name] is a 80 y.o. male, with a h/o three strokes (residual deficit of L facial droop and necessity of walking with a cane) and CAD, who presents to the Emergency Department via EMS complaining of dysphasia which lasted for approximately thirty minutes this afternoon. He also endorses pain in r forearm without associated numbness or weakness.  He denies fever, chest pain, nausea, emesis, diarrhea, or back pain. The pt reports he slept for approximately 14 hours yesterday evening which is unusual for him. He takes Plavix daily.   He has a h/o hemoptysis for two months; he has an appointment with a pulmonologist tomorrow morning. He was recently treated for pneumonia. The pt  is on 2 liters of supplemental oxygen at home. He uses a cane for ambulation at baseline.   Dr. Willey Blade is his PCP.   Past Medical History  Diagnosis Date  . Gout   . Stroke 1993, 1995    chronic balance issues  . Hyperlipidemia   . Emphysema   . Hypertension   . CAD (coronary artery disease)     stent 2006  . Seizure disorder   . GERD (gastroesophageal reflux disease)   . Cancer   . Asthma   . Syncope Jan. 1, 2014  . AAA (abdominal aortic aneurysm)   . Sinus node dysfunction     s/p PPM implant April 2015 ([facility])   Past Surgical History  Procedure Laterality Date  . Cardiac catheterization  08/23/2004    bare metal stenting of the proximal circumflex  . Colonoscopy      X 2  . Colonoscopy  11/01/2011    Procedure: COLONOSCOPY;  Surgeon: Rogene Houston, MD;   Location: [facility];  Service: Endoscopy;  Laterality: N/A;  100  . Eye surgery    . Nose surgery    . Pacemaker insertion  04-29-13    STJ Assurity dual chamber pacemaker implanted by Dr Rayann Heman for syncope and sinus node dysfunction   Family History  Problem Relation Age of Onset  . Emphysema Brother   . Heart disease Sister   . Cancer Brother     lung  . Diabetes Brother   . Cancer Brother     throat  . Diabetes Brother   . Rheum arthritis Mother   . Arthritis Mother   . Rheum arthritis Father   . Colon cancer Neg Hx   . Diabetes    . Stroke Mother   . Heart attack    . Heart attack Sister    History  Substance Use Topics  . Smoking status: Former Smoker -- 0.30 packs/day for 50 years    Types: Cigarettes    Quit date: 01/15/1990  . Smokeless tobacco: Never Used  . Alcohol Use: No    Review of Systems  A complete 10 system review of systems was obtained, and all systems were negative except where indicated in the HPI and PE.    Allergies  Penicillins  Home Medications   Prior to Admission medications   Medication Sig Start Date End Date Taking? Authorizing Provider  allopurinol (ZYLOPRIM) 300 MG tablet Take 300 mg by mouth every morning.    Yes Historical Provider, MD  clopidogrel (PLAVIX) 75 MG tablet Take 75 mg by mouth every morning.    Yes Historical Provider, MD  donepezil (ARICEPT) 10 MG tablet Take 10 mg by mouth at bedtime.    Yes Historical Provider, MD  famotidine (PEPCID) 20 MG tablet TAKE ONE TABLET BY MOUTH TWICE DAILY.   Yes Elsie Stain, MD  furosemide (LASIX) 40 MG tablet Take 20 mg by mouth daily.  08/26/13  Yes Scott Joylene Draft, PA-C  isosorbide mononitrate (IMDUR) 30 MG 24 hr tablet Take 0.5 tablets (15 mg total) by mouth daily. 08/26/13  Yes Scott T Weaver, PA-C  Linaclotide (LINZESS) 145 MCG CAPS capsule Take 1 capsule (145 mcg total) by mouth daily. 08/03/13  Yes Butch Penny, NP  metoprolol tartrate (LOPRESSOR) 25 MG tablet Take 0.5  tablets (12.5 mg total) by mouth 2 (two) times daily. 09/24/13  Yes Arnoldo Lenis, MD  nitroGLYCERIN (NITROSTAT) 0.4 MG SL tablet Place 1 tablet (0.4 mg total) under the tongue every 5 (five) minutes as needed for chest pain. 1 tablet under tongue at onset of chest pain;you may repeat every 5 minutes for up to 3 doses. 10/03/12  Yes Herminio Commons, MD  Oxcarbazepine (TRILEPTAL) 300 MG tablet Take 300 mg by mouth 2 (two) times daily.    Yes Historical Provider, MD  oxybutynin (DITROPAN) 5 MG tablet Take 5 mg by mouth 2 (two) times daily.    Yes Historical Provider, MD  pravastatin (PRAVACHOL) 40 MG tablet Take 40 mg by mouth daily. Daily at bedtime   Yes Historical Provider, MD  Tamsulosin HCl (FLOMAX) 0.4 MG CAPS Take 0.4 mg by mouth at bedtime.    Yes Historical Provider, MD  tiotropium (SPIRIVA HANDIHALER) 18 MCG inhalation capsule Place 1 capsule (18 mcg total) into inhaler and inhale daily. 03/06/13  Yes Elsie Stain, MD  VITAMIN B1-B12 IM Inject into the muscle every 30 (thirty) days. Once a month   Yes Historical Provider, MD   Triage Vitals: BP 122/63  Pulse 75  Temp(Src) 97.6 F (36.4 C) (Oral)  Resp 15  Ht 5' 11.5" (1.816 m)  Wt 177 lb (80.287 kg)  BMI 24.35 kg/m2  SpO2 100%  Physical Exam  Nursing note and vitals reviewed. Constitutional: He is oriented to person, place, and time. No distress.  HENT:  Head: Normocephalic and atraumatic.  Eyes: Conjunctivae and EOM are normal.  Neck: Neck supple. No tracheal deviation present.  Cardiovascular: Normal rate.   Pulmonary/Chest: Effort normal. No respiratory distress.  Genitourinary: Rectum normal. Guaiac negative stool.  Musculoskeletal: Normal range of motion.  Neurological: He is alert and oriented to person, place, and time. He has normal strength and normal reflexes. No cranial nerve deficit or sensory deficit. Gait normal.  Skin: Skin is warm and dry.  Psychiatric: He has a normal mood and affect. His behavior is  normal.    ED Course  Procedures (including critical care time)  DIAGNOSTIC STUDIES: Oxygen Saturation is 100% on [place], normal by my interpretation.    COORDINATION OF CARE:  1:56 PM- Discussed treatment plan with patient, and the patient agreed to the plan. The plan includes lab work and imaging.    Labs Review Labs Reviewed  CBC WITH DIFFERENTIAL - Abnormal; Notable for the  following:    RBC 2.44 (*)    Hemoglobin 8.3 (*)    HCT 24.5 (*)    MCV 100.4 (*)    RDW 15.7 (*)    Platelets 91 (*)    All other components within normal limits  COMPREHENSIVE METABOLIC PANEL - Abnormal; Notable for the following:    Glucose, Bld 122 (*)    BUN 54 (*)    Creatinine, Ser 5.29 (*)    Alkaline Phosphatase 118 (*)    GFR calc non Af Amer 9 (*)    GFR calc Af Amer 10 (*)    All other components within normal limits  URINALYSIS, ROUTINE W REFLEX MICROSCOPIC - Abnormal; Notable for the following:    Hgb urine dipstick TRACE (*)    All other components within normal limits  URINE MICROSCOPIC-ADD ON - Abnormal; Notable for the following:    Bacteria, UA FEW (*)    All other components within normal limits  TROPONIN I    Imaging Review Dg Chest 2 View  10/18/2013   CLINICAL DATA:  Right-sided arm pain weakness and difficulty speaking today, in patient with history of numerous strokes  EXAM: CHEST  2 VIEW  COMPARISON:  09/17/2013  FINDINGS: Moderate cardiac enlargement. Cardiac pacer in unchanged position. Vascular pattern is normal.  Mild diffuse interstitial prominence is similar to prior study with no focal consolidation. Tiny bilateral pleural effusions suspected.  IMPRESSION: Cardiac enlargement with tiny pleural effusions. Mild interstitial change bilaterally, similar but slightly more prominent from prior study, could reflect minimal interstitial edema.   Electronically Signed   By: Skipper Cliche M.D.   On: 10/18/2013 15:13   Ct Head Wo Contrast  10/18/2013   CLINICAL DATA:  Acute  dysphagia this afternoon, history of 3 strokes previously with residual left facial droop  EXAM: CT HEAD WITHOUT CONTRAST  TECHNIQUE: Contiguous axial images were obtained from the base of the skull through the vertex without intravenous contrast.  COMPARISON:  03/13/2013 CT and MRI  FINDINGS: Numerous chronic bilateral lacunar infarcts in the basal ganglia region, stable. Severe atrophy stable. Periventricular white matter low attenuations stable.  No evidence of hemorrhage or extra-axial fluid. No acute vascular territory infarct. Calvarium is intact.  IMPRESSION: Severe atrophy with numerous chronic lacunar infarcts, findings stable from prior studies.   Electronically Signed   By: Skipper Cliche M.D.   On: 10/18/2013 15:25     EKG Interpretation None      MDM   Final diagnoses:  Acute kidney injury  Anemia, unspecified anemia type  Dysphasia    80 y.o. male with pertinent PMH of CVA, CAD, AAA, prior TIA presents with less than 30 minutes of dysphasia, right forearm pain. The patient was in his normal state of health until last night when he felt tired at around 9 PM, and then went to sleep. He was awoken by family around noon today. He felt well for a brief period of time, the family her went to church, then he began to have right forearm pain. He'll tend to cause some but was having difficulty expressing his words and slurred speech. Son called EMS and by the time EMS arrived the patient was no longer having symptoms. On arrival to the ED, vital signs and physical exam as above. The patient has no focal neuro deficits on exam, and is asymptomatic at this time.    Labs returned as above with AKI and anemia.  Imaging unremarkable for acute pathology.  Consulted hospitalist  for admission.  1. Acute kidney injury   2. Anemia, unspecified anemia type   3. Dysphasia            Debby Freiberg, MD 10/18/13 [phone]

## 2013-10-18 NOTE — H&P (Addendum)
History and Physical  [name] [name]:[id number] DOB: 1921/12/30 DOA: 10/18/2013  Referring physician: Dr [name], ED physician PCP: James Noble, MD   Chief Complaint: Slurred speech, right arm pain  HPI: [name] is a 80 y.o. male  With a history of 3 previous strokes, CT DX stage III, COPD and called stage A., coronary artery disease, hypertension, hyperlipidemia, emphysema, stable abdominal aneurysm, GERD. He presents to the hospital after having right arm pain with difficulty moving his right arm that was accompanied by slurred speech. These symptoms lasted about an hour. He went to bed last night around 9 or 10 PM and slept until 9 this morning. His son called him and spoke to him and, though he sounded groggy, his speech was normal. When his son called him back at noon, and he had slurred speech and difficulty word finding. Additionally, he dropped the phone several times. EMS was called and by the time he arrived to the hospital his symptoms had resolved.  Additionally, the patient has had symptoms of hemoptysis for approximately one month. He has been treated with antibiotics for pneumonia, which has slowed down the amount of blood he coughs up, but has not resolved his symptoms.   Review of Systems:   Pt complains of hematochezia.  Pt denies any fevers, chills, headache, blurred vision, double vision, chest pain, shortness of breath, neck pain, syncopal episode, abdominal pain, and blood in his stool, melena.  Review of systems are otherwise negative  Past Medical History  Diagnosis Date  . Gout   . Stroke 1993, 1995    chronic balance issues  . Hyperlipidemia   . Emphysema   . Hypertension   . CAD (coronary artery disease)     stent 2006  . Seizure disorder   . GERD (gastroesophageal reflux disease)   . Cancer   . Asthma   . Syncope Jan. 1, 2014  . AAA (abdominal aortic aneurysm)   . Sinus node dysfunction     s/p PPM implant April 2015 ([facility])   Past Surgical  History  Procedure Laterality Date  . Cardiac catheterization  08/23/2004    bare metal stenting of the proximal circumflex  . Colonoscopy      X 2  . Colonoscopy  11/01/2011    Procedure: COLONOSCOPY;  Surgeon: James Houston, MD;  Location: [facility];  Service: Endoscopy;  Laterality: N/A;  100  . Eye surgery    . Nose surgery    . Pacemaker insertion  04-29-13    STJ Assurity dual chamber pacemaker implanted by Dr [name] for syncope and sinus node dysfunction   Social History:  reports that he quit smoking about 23 years ago. His smoking use included Cigarettes. He has a 15 pack-year smoking history. He has never used smokeless tobacco. He reports that he does not drink alcohol or use illicit drugs. Patient lives at home & is able to participate in activities of daily living  Without assistance  Allergies  Allergen Reactions  . Penicillins Itching    Family History  Problem Relation Age of Onset  . Emphysema Brother   . Heart disease Sister   . Cancer Brother     lung  . Diabetes Brother   . Cancer Brother     throat  . Diabetes Brother   . Rheum arthritis Mother   . Arthritis Mother   . Rheum arthritis Father   . Colon cancer Neg Hx   . Diabetes    .  Stroke Mother   . Heart attack    . Heart attack Sister       Prior to Admission medications   Medication Sig Start Date End Date Taking? Authorizing Provider  allopurinol (ZYLOPRIM) 300 MG tablet Take 300 mg by mouth every morning.    Yes Historical Provider, MD  clopidogrel (PLAVIX) 75 MG tablet Take 75 mg by mouth every morning.    Yes Historical Provider, MD  donepezil (ARICEPT) 10 MG tablet Take 10 mg by mouth at bedtime.    Yes Historical Provider, MD  famotidine (PEPCID) 20 MG tablet TAKE ONE TABLET BY MOUTH TWICE DAILY.   Yes James Stain, MD  furosemide (LASIX) 40 MG tablet Take 20 mg by mouth daily.  08/26/13  Yes James Joylene Draft, PA-C  isosorbide mononitrate (IMDUR) 30 MG 24 hr tablet Take 0.5 tablets  (15 mg total) by mouth daily. 08/26/13  Yes James T Weaver, PA-C  Linaclotide (LINZESS) 145 MCG CAPS capsule Take 1 capsule (145 mcg total) by mouth daily. 08/03/13  Yes James Penny, NP  metoprolol tartrate (LOPRESSOR) 25 MG tablet Take 0.5 tablets (12.5 mg total) by mouth 2 (two) times daily. 09/24/13  Yes James Lenis, MD  nitroGLYCERIN (NITROSTAT) 0.4 MG SL tablet Place 1 tablet (0.4 mg total) under the tongue every 5 (five) minutes as needed for chest pain. 1 tablet under tongue at onset of chest pain;you may repeat every 5 minutes for up to 3 doses. 10/03/12  Yes James Commons, MD  Oxcarbazepine (TRILEPTAL) 300 MG tablet Take 300 mg by mouth 2 (two) times daily.    Yes Historical Provider, MD  oxybutynin (DITROPAN) 5 MG tablet Take 5 mg by mouth 2 (two) times daily.    Yes Historical Provider, MD  pravastatin (PRAVACHOL) 40 MG tablet Take 40 mg by mouth daily. Daily at bedtime   Yes Historical Provider, MD  Tamsulosin HCl (FLOMAX) 0.4 MG CAPS Take 0.4 mg by mouth at bedtime.    Yes Historical Provider, MD  tiotropium (SPIRIVA HANDIHALER) 18 MCG inhalation capsule Place 1 capsule (18 mcg total) into inhaler and inhale daily. 03/06/13  Yes James Stain, MD  VITAMIN B1-B12 IM Inject into the muscle every 30 (thirty) days. Once a month   Yes Historical Provider, MD    Physical Exam: BP 115/65  Pulse 69  Temp(Src) 97.6 F (36.4 C) (Oral)  Resp 14  Ht 5' 11.5" (1.816 m)  Wt 80.287 kg (177 lb)  BMI 24.35 kg/m2  SpO2 94%  General: Elderly Caucasian male. Awake and alert and oriented x3. No acute cardiopulmonary distress.  Eyes: Pupils equal, round, reactive to light. Extraocular muscles are intact. Sclerae anicteric and noninjected.  ENT: External auditory canals are patent and tympanic membranes reflect a good cone of light. Moist mucosal membranes. No mucosal lesions. Teeth in moderate repair  Neck: Neck supple without lymphadenopathy. No carotid bruits. No masses palpated.    Cardiovascular: Regular rate with normal S1-S2 sounds. No murmurs, rubs, gallops auscultated. No JVD.  Respiratory: Good respiratory effort with no wheezes, rales, rhonchi. Lungs clear to auscultation bilaterally.  Abdomen: Soft, nontender, nondistended. Active bowel sounds. No masses or hepatosplenomegaly  Skin: Dry, warm to touch. 2+ dorsalis pedis and radial pulses. Musculoskeletal: No calf or leg pain. All major joints not erythematous nontender.  Psychiatric: Intact judgment and insight.  Neurologic: No focal neurological deficits. Cranial nerves II through XII are grossly intact. Coordination intact  Labs on Admission:  Basic Metabolic Panel:  Recent Labs Lab 10/18/13 1404  NA 141  K 4.2  CL 101  CO2 27  GLUCOSE 122*  BUN 54*  CREATININE 5.29*  CALCIUM 9.2   Liver Function Tests:  Recent Labs Lab 10/18/13 1404  AST 18  ALT 15  ALKPHOS 118*  BILITOT 0.4  PROT 6.7  ALBUMIN 3.7   No results found for this basename: LIPASE, AMYLASE,  in the last 168 hours No results found for this basename: AMMONIA,  in the last 168 hours CBC:  Recent Labs Lab 10/18/13 1404  WBC 5.7  NEUTROABS 3.7  HGB 8.3*  HCT 24.5*  MCV 100.4*  PLT 91*   Cardiac Enzymes:  Recent Labs Lab 10/18/13 1404  TROPONINI <0.30    BNP (last 3 results)  Recent Labs  04/17/13 2008  PROBNP 3970.0*   CBG: No results found for this basename: GLUCAP,  in the last 168 hours  Radiological Exams on Admission: Dg Chest 2 View  10/18/2013   CLINICAL DATA:  Right-sided arm pain weakness and difficulty speaking today, in patient with history of numerous strokes  EXAM: CHEST  2 VIEW  COMPARISON:  09/17/2013  FINDINGS: Moderate cardiac enlargement. Cardiac pacer in unchanged position. Vascular pattern is normal.  Mild diffuse interstitial prominence is similar to prior study with no focal consolidation. Tiny bilateral pleural effusions suspected.  IMPRESSION: Cardiac enlargement with  tiny pleural effusions. Mild interstitial change bilaterally, similar but slightly more prominent from prior study, could reflect minimal interstitial edema.   Electronically Signed   By: Skipper Cliche M.D.   On: 10/18/2013 15:13   Ct Head Wo Contrast  10/18/2013   CLINICAL DATA:  Acute dysphagia this afternoon, history of 3 strokes previously with residual left facial droop  EXAM: CT HEAD WITHOUT CONTRAST  TECHNIQUE: Contiguous axial images were obtained from the base of the skull through the vertex without intravenous contrast.  COMPARISON:  03/13/2013 CT and MRI  FINDINGS: Numerous chronic bilateral lacunar infarcts in the basal ganglia region, stable. Severe atrophy stable. Periventricular white matter low attenuations stable.  No evidence of hemorrhage or extra-axial fluid. No acute vascular territory infarct. Calvarium is intact.  IMPRESSION: Severe atrophy with numerous chronic lacunar infarcts, findings stable from prior studies.   Electronically Signed   By: Skipper Cliche M.D.   On: 10/18/2013 15:25    EKG: Independently reviewed. Paced rhythm  Assessment/Plan Present on Admission:  . Acute kidney injury . Anemia . TIA (transient ischemic attack) . Hemoptysis  #1 TIA The patient's neurological symptoms are consistent with a TIA. Patient currently has complete resolution of neurological symptoms. Swelling screen was done in the emergency department. Admit to Dr. Willey Blade.  As the patient has a heart pacemaker, not able to do MR. We'll get carotid ultrasound in the morning. Continue Plavix  #2 acute kidney failure Patient is on diuretics - Lasix and has relatively poor by mouth intake. Likely dehydration related. We'll stop Lasix and give IV fluids. No recent echocardiogram on the chart, however nuclear stress test in 2007 shows an ejection fraction of 47% with hypokinesis. We'll be cautious with fluid hydration as to not place them in overt failure. Will repeat creatinine in the  morning.  #3 anemia No evidence of GI bleed as Hemoccult negative in the ER. Possible etiology is the hematochezia. We'll consult pulmonolgy.  #4 hemoptysis Consult pulmonology  #5 hypertension Continue home medications new  #6 GERD Continue PPI  #7 thrombocythemia  Chronic   DVT prophylaxis: Pancytopenic and having hematochezia. SCDs  Consultants: Pulmonology  Code Status: DO NOT RESUSCITATE  Family Communication: Son is in the room   Disposition Plan: Home following treatment  Time spent: 70 minutes  [place], [place] [facility] Pager [phone]  **Disclaimer: This note may have been dictated with voice recognition software. Similar sounding words can inadvertently be transcribed and this note may contain transcription errors which may not have been corrected upon publication of note.**

## 2013-10-18 NOTE — ED Notes (Signed)
Patient gone to x-ray at this time. 

## 2013-10-19 ENCOUNTER — Inpatient Hospital Stay (HOSPITAL_COMMUNITY): Payer: Medicare Other

## 2013-10-19 ENCOUNTER — Ambulatory Visit: Payer: Medicare Other | Admitting: Critical Care Medicine

## 2013-10-19 LAB — CBC
HEMATOCRIT: 22.4 % — AB (ref 39.0–52.0)
Hemoglobin: 7.9 g/dL — ABNORMAL LOW (ref 13.0–17.0)
MCH: 35.3 pg — ABNORMAL HIGH (ref 26.0–34.0)
MCHC: 35.3 g/dL (ref 30.0–36.0)
MCV: 100 fL (ref 78.0–100.0)
PLATELETS: 97 10*3/uL — AB (ref 150–400)
RBC: 2.24 MIL/uL — ABNORMAL LOW (ref 4.22–5.81)
RDW: 15.8 % — AB (ref 11.5–15.5)
WBC: 5.5 10*3/uL (ref 4.0–10.5)

## 2013-10-19 LAB — BASIC METABOLIC PANEL
ANION GAP: 15 (ref 5–15)
BUN: 54 mg/dL — ABNORMAL HIGH (ref 6–23)
CALCIUM: 8.7 mg/dL (ref 8.4–10.5)
CO2: 24 meq/L (ref 19–32)
CREATININE: 4.93 mg/dL — AB (ref 0.50–1.35)
Chloride: 104 mEq/L (ref 96–112)
GFR, EST AFRICAN AMERICAN: 11 mL/min — AB (ref >90)
GFR, EST NON AFRICAN AMERICAN: 9 mL/min — AB (ref >90)
Glucose, Bld: 102 mg/dL — ABNORMAL HIGH (ref 70–99)
Potassium: 4.4 mEq/L (ref 3.7–5.3)
SODIUM: 143 meq/L (ref 137–147)

## 2013-10-19 MED ORDER — STROKE: EARLY STAGES OF RECOVERY BOOK
Freq: Once | Status: AC
  Administered 2013-10-19: 09:00:00
  Filled 2013-10-19: qty 1

## 2013-10-19 MED ORDER — ASPIRIN 81 MG PO CHEW
81.0000 mg | CHEWABLE_TABLET | Freq: Every day | ORAL | Status: DC
Start: 2013-10-19 — End: 2013-10-28
  Administered 2013-10-19 – 2013-10-28 (×10): 81 mg via ORAL
  Filled 2013-10-19 (×11): qty 1

## 2013-10-19 NOTE — Care Management Utilization Note (Signed)
UR completed 

## 2013-10-19 NOTE — Consult Note (Signed)
Consult requested by: [facility] hospitalist for Dr. Willey Blade Consult requested for hemoptysis:  HPI: This is a 80 year old who has multiple medical problems and who came to the emergency department because of slurred speech and difficulty using his right arm. His symptoms spontaneously resolved. Consultation is requested because he had hemoptysis for about a month. He says he coughs up about a teaspoon at a time several times a day. It is mixed with sputum. He denies any chest pain. He has no other new complaints.  Past Medical History  Diagnosis Date  . Gout   . Stroke 1993, 1995    chronic balance issues  . Hyperlipidemia   . Emphysema   . Hypertension   . CAD (coronary artery disease)     stent 2006  . Seizure disorder   . GERD (gastroesophageal reflux disease)   . Cancer   . Asthma   . Syncope Jan. 1, 2014  . AAA (abdominal aortic aneurysm)   . Sinus node dysfunction     s/p PPM implant April 2015 ([facility] Jude)     Family History  Problem Relation Age of Onset  . Emphysema Brother   . Heart disease Sister   . Cancer Brother     lung  . Diabetes Brother   . Cancer Brother     throat  . Diabetes Brother   . Rheum arthritis Mother   . Arthritis Mother   . Rheum arthritis Father   . Colon cancer Neg Hx   . Diabetes    . Stroke Mother   . Heart attack    . Heart attack Sister      History   Social History  . Marital Status: Widowed    Spouse Name: N/A    Number of Children: 2  . Years of Education: N/A   Occupational History  . Retired Multimedia programmer   Social History Main Topics  . Smoking status: Former Smoker -- 0.30 packs/day for 50 years    Types: Cigarettes    Quit date: 01/15/1990  . Smokeless tobacco: Never Used  . Alcohol Use: No  . Drug Use: No  . Sexual Activity: None   Other Topics Concern  . None   Social History Narrative  . None     ROS: He's not had chest pain fever chills nausea vomiting but has had the problems neurologically as  mentioned. Otherwise review of systems is per the patients history and physical    Objective: Vital signs in last 24 hours: Temp:  [97.6 F (36.4 C)-98.8 F (37.1 C)] 98.8 F (37.1 C) (10/05 0532) Pulse Rate:  [66-81] 70 (10/05 0532) Resp:  [13-20] 18 (10/05 0532) BP: (107-131)/(59-75) 118/64 mmHg (10/05 0532) SpO2:  [93 %-100 %] 94 % (10/05 0815) Weight:  [80.287 kg (177 lb)] 80.287 kg (177 lb) (10/04 1900) Weight change:  Last BM Date: 10/16/13  Intake/Output from previous day:    PHYSICAL EXAM He is awake and alert and in no acute distress. Neurological exam is grossly intact. His nose and throat are clear. Pupils are reactive his neck is supple without masses. His chest is clear. His heart is regular. Abdomen soft without masses extremities showed no edema  Lab Results: Basic Metabolic Panel:  Recent Labs  10/18/13 1404 10/19/13 0559  NA 141 143  K 4.2 4.4  CL 101 104  CO2 27 24  GLUCOSE 122* 102*  BUN 54* 54*  CREATININE 5.29* 4.93*  CALCIUM 9.2 8.7  Liver Function Tests:  Recent Labs  10/18/13 1404  AST 18  ALT 15  ALKPHOS 118*  BILITOT 0.4  PROT 6.7  ALBUMIN 3.7   No results found for this basename: LIPASE, AMYLASE,  in the last 72 hours No results found for this basename: AMMONIA,  in the last 72 hours CBC:  Recent Labs  10/18/13 1404 10/19/13 0559  WBC 5.7 5.5  NEUTROABS 3.7  --   HGB 8.3* 7.9*  HCT 24.5* 22.4*  MCV 100.4* 100.0  PLT 91* 97*   Cardiac Enzymes:  Recent Labs  10/18/13 1404  TROPONINI <0.30   BNP: No results found for this basename: PROBNP,  in the last 72 hours D-Dimer: No results found for this basename: DDIMER,  in the last 72 hours CBG: No results found for this basename: GLUCAP,  in the last 72 hours Hemoglobin A1C: No results found for this basename: HGBA1C,  in the last 72 hours Fasting Lipid Panel: No results found for this basename: CHOL, HDL, LDLCALC, TRIG, CHOLHDL, LDLDIRECT,  in the last 72  hours Thyroid Function Tests: No results found for this basename: TSH, T4TOTAL, FREET4, T3FREE, THYROIDAB,  in the last 72 hours Anemia Panel: No results found for this basename: VITAMINB12, FOLATE, FERRITIN, TIBC, IRON, RETICCTPCT,  in the last 72 hours Coagulation: No results found for this basename: LABPROT, INR,  in the last 72 hours Urine Drug Screen: Drugs of Abuse  No results found for this basename: labopia, cocainscrnur, labbenz, amphetmu, thcu, labbarb    Alcohol Level: No results found for this basename: ETH,  in the last 72 hours Urinalysis:  Recent Labs  10/18/13 1437  COLORURINE YELLOW  LABSPEC 1.015  PHURINE 6.0  GLUCOSEU NEGATIVE  HGBUR TRACE*  [place] 0.2  [place]. Labs:   ABGS: No results found for this basename: PHART, PCO2, PO2ART, TCO2, HCO3,  in the last 72 hours   MICROBIOLOGY: No results found for this or any previous visit (from the past 240 hour(s)).  Studies/Results: Dg Chest 2 View  10/18/2013   CLINICAL DATA:  Right-sided arm pain weakness and difficulty speaking today, in patient with history of numerous strokes  EXAM: CHEST  2 VIEW  COMPARISON:  09/17/2013  FINDINGS: Moderate cardiac enlargement. Cardiac pacer in unchanged position. Vascular pattern is normal.  Mild diffuse interstitial prominence is similar to prior study with no focal consolidation. Tiny bilateral pleural effusions suspected.  IMPRESSION: Cardiac enlargement with tiny pleural effusions. Mild interstitial change bilaterally, similar but slightly more prominent from prior study, could reflect minimal interstitial edema.   Electronically Signed   By: Skipper Cliche M.D.   On: 10/18/2013 15:13   Ct Head Wo Contrast  10/18/2013   CLINICAL DATA:  Acute dysphagia this afternoon, history of 3 strokes previously with residual left facial droop  EXAM: CT HEAD WITHOUT CONTRAST   TECHNIQUE: Contiguous axial images were obtained from the base of the skull through the vertex without intravenous contrast.  COMPARISON:  03/13/2013 CT and MRI  FINDINGS: Numerous chronic bilateral lacunar infarcts in the basal ganglia region, stable. Severe atrophy stable. Periventricular white matter low attenuations stable.  No evidence of hemorrhage or extra-axial fluid. No acute vascular territory infarct. Calvarium is intact.  IMPRESSION: Severe atrophy with numerous chronic lacunar infarcts, findings stable from prior studies.   Electronically Signed   By: Skipper Cliche M.D.   On: 10/18/2013 15:25  Medications:  Prior to Admission:  Prescriptions prior to admission  Medication Sig Dispense Refill  . allopurinol (ZYLOPRIM) 300 MG tablet Take 300 mg by mouth every morning.       . clopidogrel (PLAVIX) 75 MG tablet Take 75 mg by mouth every morning.       . donepezil (ARICEPT) 10 MG tablet Take 10 mg by mouth at bedtime.       . famotidine (PEPCID) 20 MG tablet TAKE ONE TABLET BY MOUTH TWICE DAILY.  60 tablet  5  . furosemide (LASIX) 40 MG tablet Take 20 mg by mouth daily.       . isosorbide mononitrate (IMDUR) 30 MG 24 hr tablet Take 0.5 tablets (15 mg total) by mouth daily.  30 tablet  11  . Linaclotide (LINZESS) 145 MCG CAPS capsule Take 1 capsule (145 mcg total) by mouth daily.  30 capsule  5  . metoprolol tartrate (LOPRESSOR) 25 MG tablet Take 0.5 tablets (12.5 mg total) by mouth 2 (two) times daily.  60 tablet  3  . nitroGLYCERIN (NITROSTAT) 0.4 MG SL tablet Place 1 tablet (0.4 mg total) under the tongue every 5 (five) minutes as needed for chest pain. 1 tablet under tongue at onset of chest pain;you may repeat every 5 minutes for up to 3 doses.  25 tablet  3  . Oxcarbazepine (TRILEPTAL) 300 MG tablet Take 300 mg by mouth 2 (two) times daily.       Marland Kitchen oxybutynin (DITROPAN) 5 MG tablet Take 5 mg by mouth 2 (two) times daily.       . pravastatin (PRAVACHOL) 40 MG tablet Take 40 mg by  mouth daily. Daily at bedtime      . Tamsulosin HCl (FLOMAX) 0.4 MG CAPS Take 0.4 mg by mouth at bedtime.       Marland Kitchen tiotropium (SPIRIVA HANDIHALER) 18 MCG inhalation capsule Place 1 capsule (18 mcg total) into inhaler and inhale daily.  30 capsule  6  . VITAMIN B1-B12 IM Inject into the muscle every 30 (thirty) days. Once a month       Scheduled: .  stroke: mapping our early stages of recovery book   Does not apply Once  . allopurinol  300 mg Oral q morning - 10a  . clopidogrel  75 mg Oral q morning - 10a  . donepezil  10 mg Oral QHS  . famotidine  20 mg Oral Daily  . isosorbide mononitrate  15 mg Oral Daily  . Linaclotide  145 mcg Oral Daily  . metoprolol tartrate  12.5 mg Oral BID  . Oxcarbazepine  300 mg Oral BID  . oxybutynin  5 mg Oral BID  . simvastatin  20 mg Oral q1800  . sodium chloride  3 mL Intravenous Q12H  . tamsulosin  0.4 mg Oral QHS  . tiotropium  18 mcg Inhalation Daily   Continuous: . sodium chloride 100 mL/hr at 10/18/13 1832   KDX:[id number], acetaminophen, alum & mag hydroxide-simeth, docusate sodium, nitroGLYCERIN, ondansetron (ZOFRAN) IV, ondansetron  Assesment: He is admitted with TIA. He's had poor oral intake for the last few days and has acute kidney injury and probably dehydration. He has hemoptysis and will need a CT of the chest if we can get his renal function good enough to do that with contrast. I don't think he needs anything like bronchoscopy at this point. Principal Problem:   TIA (transient ischemic attack) Active Problems:   Acute kidney injury   Anemia   Hemoptysis  Plan: Continue current treatments. I will assume primary care for him while his primary care physician Dr. Willey Blade is out of town. I have requested nephrology and neurology consultation    LOS: 1 day   Darick Fetters L 10/19/2013, 8:51 AM

## 2013-10-19 NOTE — Consult Note (Signed)
[place] A. Merlene Laughter, MD     [email or web address]          [name] is an 80 y.o. male.   ASSESSMENT/PLAN:  1. Likely another lacunar infarct.   2.  The patient seemed amnestic to the current event which is concerning.  3.  Cognitive impairment at baseline.  4. Peripheral neuropathy.   Aspirin was combination is suggestive for 3 months. Afterwards Plavix only. EEG  The patient a 80 year old man who was noted by his son to be dysarthric and having right-sided weakness. The event appears to be transient. The patient seemed to have cognitive impairment at baseline and therefore the history is difficult. He actually has no recollection as to why he is in the hospital. He told that he has had hemoptysis over last few months and this is why he is in the hospital. The patient does not report having any episodes of dysarthria or focal weakness. Again, the history is very limited and essentially unreliable because of his cognitive impairment and be amnestic [place] otherwise in the hospital. He does not report having chest pain. He does have dyspnea is ongoing he uses oxygen at home per the patient. No GI or GU symptoms are reported. The patient seen today at baseline.  GENERAL: He is in no acute distress.  HEENT: Supple. Atraumatic normocephalic.   ABDOMEN: soft  EXTREMITIES: No edema   BACK: Normal.  SKIN: Normal by inspection.    MENTAL STATUS: He is sleeping but easily arousable to verbal commands. He follows commands well. He noted that his 84. He thinks his September. He knows that he is in the hospital at [facility].  CRANIAL NERVES: Pupils are equal, round and reactive to light and accommodation; extra ocular movements are full, there is no significant nystagmus; visual fields are full; upper and lower facial muscles are normal in strength and symmetric, there is no flattening of the nasolabial folds; tongue is midline; uvula is midline; shoulder elevation  is normal.  MOTOR: Normal tone, bulk and strength- moderate weakness/atrophy of the FDI bilaterally; there is a left upper extremity pronator drift.  COORDINATION: Left finger to nose is normal, right finger to nose is normal, No rest tremor; no intention tremor; no postural tremor; no bradykinesia.  REFLEXES: Deep tendon reflexes are symmetrical and normal. Babinski reflexes are flexor bilaterally.   SENSATION: Normal to light touch.  Head CT scan is reviewed in person there is a large lacunar infarcts involving the basal ganglia on the right side. It is also less basic infarct in the right tiny thalamic lacunar infarct. There is moderate global atrophy.       Blood pressure 118/64, pulse 70, temperature 98.8 F (37.1 C), temperature source Oral, resp. rate 18, height 5' 11.5" (1.816 m), weight 80.287 kg (177 lb), SpO2 94.00%.  Past Medical History  Diagnosis Date  . Gout   . Stroke 1993, 1995    chronic balance issues  . Hyperlipidemia   . Emphysema   . Hypertension   . CAD (coronary artery disease)     stent 2006  . Seizure disorder   . GERD (gastroesophageal reflux disease)   . Cancer   . Asthma   . Syncope Jan. 1, 2014  . AAA (abdominal aortic aneurysm)   . Sinus node dysfunction     s/p PPM implant April 2015 ([facility] Jude)    Past Surgical History  Procedure Laterality Date  . Cardiac catheterization  08/23/2004    bare  metal stenting of the proximal circumflex  . Colonoscopy      X 2  . Colonoscopy  11/01/2011    Procedure: COLONOSCOPY;  Surgeon: Rogene Houston, MD;  Location: [facility];  Service: Endoscopy;  Laterality: N/A;  100  . Eye surgery    . Nose surgery    . Pacemaker insertion  04-29-13    STJ Assurity dual chamber pacemaker implanted by Dr Rayann Heman for syncope and sinus node dysfunction    Family History  Problem Relation Age of Onset  . Emphysema Brother   . Heart disease Sister   . Cancer Brother     lung  . Diabetes Brother   . Cancer  Brother     throat  . Diabetes Brother   . Rheum arthritis Mother   . Arthritis Mother   . Rheum arthritis Father   . Colon cancer Neg Hx   . Diabetes    . Stroke Mother   . Heart attack    . Heart attack Sister     Social History:  reports that he quit smoking about 23 years ago. His smoking use included Cigarettes. He has a 15 pack-year smoking history. He has never used smokeless tobacco. He reports that he does not drink alcohol or use illicit drugs.  Allergies:  Allergies  Allergen Reactions  . Penicillins Itching    Medications: Prior to Admission medications   Medication Sig Start Date End Date Taking? Authorizing Provider  allopurinol (ZYLOPRIM) 300 MG tablet Take 300 mg by mouth every morning.    Yes Historical Provider, MD  clopidogrel (PLAVIX) 75 MG tablet Take 75 mg by mouth every morning.    Yes Historical Provider, MD  donepezil (ARICEPT) 10 MG tablet Take 10 mg by mouth at bedtime.    Yes Historical Provider, MD  famotidine (PEPCID) 20 MG tablet TAKE ONE TABLET BY MOUTH TWICE DAILY.   Yes Elsie Stain, MD  furosemide (LASIX) 40 MG tablet Take 20 mg by mouth daily.  08/26/13  Yes Scott Joylene Draft, PA-C  isosorbide mononitrate (IMDUR) 30 MG 24 hr tablet Take 0.5 tablets (15 mg total) by mouth daily. 08/26/13  Yes Scott T Weaver, PA-C  Linaclotide (LINZESS) 145 MCG CAPS capsule Take 1 capsule (145 mcg total) by mouth daily. 08/03/13  Yes Butch Penny, NP  metoprolol tartrate (LOPRESSOR) 25 MG tablet Take 0.5 tablets (12.5 mg total) by mouth 2 (two) times daily. 09/24/13  Yes Arnoldo Lenis, MD  nitroGLYCERIN (NITROSTAT) 0.4 MG SL tablet Place 1 tablet (0.4 mg total) under the tongue every 5 (five) minutes as needed for chest pain. 1 tablet under tongue at onset of chest pain;you may repeat every 5 minutes for up to 3 doses. 10/03/12  Yes Herminio Commons, MD  Oxcarbazepine (TRILEPTAL) 300 MG tablet Take 300 mg by mouth 2 (two) times daily.    Yes Historical Provider,  MD  oxybutynin (DITROPAN) 5 MG tablet Take 5 mg by mouth 2 (two) times daily.    Yes Historical Provider, MD  pravastatin (PRAVACHOL) 40 MG tablet Take 40 mg by mouth daily. Daily at bedtime   Yes Historical Provider, MD  Tamsulosin HCl (FLOMAX) 0.4 MG CAPS Take 0.4 mg by mouth at bedtime.    Yes Historical Provider, MD  tiotropium (SPIRIVA HANDIHALER) 18 MCG inhalation capsule Place 1 capsule (18 mcg total) into inhaler and inhale daily. 03/06/13  Yes Elsie Stain, MD  VITAMIN B1-B12 IM Inject into the muscle every  30 (thirty) days. Once a month   Yes Historical Provider, MD    Scheduled Meds: . allopurinol  300 mg Oral q morning - 10a  . clopidogrel  75 mg Oral q morning - 10a  . donepezil  10 mg Oral QHS  . famotidine  20 mg Oral Daily  . isosorbide mononitrate  15 mg Oral Daily  . Linaclotide  145 mcg Oral Daily  . metoprolol tartrate  12.5 mg Oral BID  . Oxcarbazepine  300 mg Oral BID  . oxybutynin  5 mg Oral BID  . simvastatin  20 mg Oral q1800  . sodium chloride  3 mL Intravenous Q12H  . tamsulosin  0.4 mg Oral QHS  . tiotropium  18 mcg Inhalation Daily   Continuous Infusions: . sodium chloride 125 mL/hr at 10/19/13 1431   PRN Meds:.acetaminophen, acetaminophen, alum & mag hydroxide-simeth, docusate sodium, nitroGLYCERIN, ondansetron (ZOFRAN) IV, ondansetron     Results for orders placed during the hospital encounter of 10/18/13 (from the past 48 hour(s))  CBC WITH DIFFERENTIAL     Status: Abnormal   Collection Time    10/18/13  2:04 PM      Result Value Ref Range   WBC 5.7  4.0 - 10.5 K/uL   RBC 2.44 (*) 4.22 - 5.81 MIL/uL   Hemoglobin 8.3 (*) 13.0 - 17.0 g/dL   HCT 24.5 (*) 39.0 - 52.0 %   MCV 100.4 (*) 78.0 - 100.0 fL   MCH 34.0  26.0 - 34.0 pg   MCHC 33.9  30.0 - 36.0 g/dL   RDW 15.7 (*) 11.5 - 15.5 %   Platelets 91 (*) 150 - 400 K/uL   Comment: PLATELET COUNT CONFIRMED BY SMEAR     SPECIMEN CHECKED FOR CLOTS   Neutrophils Relative % 64  43 - 77 %    Neutro Abs 3.7  1.7 - 7.7 K/uL   Lymphocytes Relative 23  12 - 46 %   Lymphs Abs 1.3  0.7 - 4.0 K/uL   Monocytes Relative 9  3 - 12 %   Monocytes Absolute 0.5  0.1 - 1.0 K/uL   Eosinophils Relative 4  0 - 5 %   Eosinophils Absolute 0.2  0.0 - 0.7 K/uL   Basophils Relative 0  0 - 1 %   Basophils Absolute 0.0  0.0 - 0.1 K/uL  COMPREHENSIVE METABOLIC PANEL     Status: Abnormal   Collection Time    10/18/13  2:04 PM      Result Value Ref Range   Sodium 141  137 - 147 mEq/L   Potassium 4.2  3.7 - 5.3 mEq/L   Chloride 101  96 - 112 mEq/L   CO2 27  19 - 32 mEq/L   Glucose, Bld 122 (*) 70 - 99 mg/dL   BUN 54 (*) 6 - 23 mg/dL   Creatinine, Ser 5.29 (*) 0.50 - 1.35 mg/dL   Calcium 9.2  8.4 - 10.5 mg/dL   Total Protein 6.7  6.0 - 8.3 g/dL   Albumin 3.7  3.5 - 5.2 g/dL   AST 18  0 - 37 U/L   ALT 15  0 - 53 U/L   Alkaline Phosphatase 118 (*) 39 - 117 U/L   Total Bilirubin 0.4  0.3 - 1.2 mg/dL   GFR calc non Af Amer 9 (*) >90 mL/min   GFR calc Af Amer 10 (*) >90 mL/min   Comment: (NOTE)     The eGFR has been calculated  using the CKD EPI equation.     This calculation has not been validated in all clinical situations.     eGFR's persistently <90 mL/min signify possible Chronic Kidney     Disease.   Anion gap 13  5 - 15  TROPONIN I     Status: None   Collection Time    10/18/13  2:04 PM      Result Value Ref Range   Troponin I <0.30  <0.30 ng/mL   Comment:            Due to the release kinetics of cTnI,     a negative result within the first hours     of the onset of symptoms does not rule out     myocardial infarction with certainty.     If myocardial infarction is still suspected,     repeat the test at appropriate intervals.  URINALYSIS, ROUTINE W REFLEX MICROSCOPIC     Status: Abnormal   Collection Time    10/18/13  2:37 PM      Result Value Ref Range   Color, Urine YELLOW  YELLOW   APPearance CLEAR  CLEAR   Specific Gravity, Urine 1.015  1.005 - 1.030   pH 6.0  5.0 - 8.0    Glucose, UA NEGATIVE  NEGATIVE mg/dL   Hgb urine dipstick TRACE (*) NEGATIVE   Bilirubin Urine NEGATIVE  NEGATIVE   Ketones, ur NEGATIVE  NEGATIVE mg/dL   Protein, ur NEGATIVE  NEGATIVE mg/dL   Urobilinogen, UA 0.2  0.0 - 1.0 mg/dL   Nitrite NEGATIVE  NEGATIVE   Leukocytes, UA NEGATIVE  NEGATIVE  URINE MICROSCOPIC-ADD ON     Status: Abnormal   Collection Time    10/18/13  2:37 PM      Result Value Ref Range   WBC, UA 7-10  <3 WBC/hpf   RBC / HPF 0-2  <3 RBC/hpf   Bacteria, UA FEW (*) RARE  BASIC METABOLIC PANEL     Status: Abnormal   Collection Time    10/19/13  5:59 AM      Result Value Ref Range   Sodium 143  137 - 147 mEq/L   Potassium 4.4  3.7 - 5.3 mEq/L   Chloride 104  96 - 112 mEq/L   CO2 24  19 - 32 mEq/L   Glucose, Bld 102 (*) 70 - 99 mg/dL   BUN 54 (*) 6 - 23 mg/dL   Creatinine, Ser 4.93 (*) 0.50 - 1.35 mg/dL   Calcium 8.7  8.4 - 10.5 mg/dL   GFR calc non Af Amer 9 (*) >90 mL/min   GFR calc Af Amer 11 (*) >90 mL/min   Comment: (NOTE)     The eGFR has been calculated using the CKD EPI equation.     This calculation has not been validated in all clinical situations.     eGFR's persistently <90 mL/min signify possible Chronic Kidney     Disease.   Anion gap 15  5 - 15  CBC     Status: Abnormal   Collection Time    10/19/13  5:59 AM      Result Value Ref Range   WBC 5.5  4.0 - 10.5 K/uL   RBC 2.24 (*) 4.22 - 5.81 MIL/uL   Hemoglobin 7.9 (*) 13.0 - 17.0 g/dL   HCT 22.4 (*) 39.0 - 52.0 %   MCV 100.0  78.0 - 100.0 fL   MCH 35.3 (*) 26.0 - 34.0 pg  MCHC 35.3  30.0 - 36.0 g/dL   RDW 15.8 (*) 11.5 - 15.5 %   Platelets 97 (*) 150 - 400 K/uL   Comment: SPECIMEN CHECKED FOR CLOTS     CONSISTENT WITH PREVIOUS RESULT    Studies/Results:  HEAD CT  Severe atrophy with numerous chronic lacunar infarcts, findings  stable from prior studies.  ECHO 04-2013 - Left ventricle: The cavity size was normal. Wall thickness was increased in a pattern of severe LVH. There was  mild focal basal hypertrophy of the septum. Systolic function was normal. The estimated ejection fraction was in the range of 55% to 60%. Wall motion was normal; there were no regional wall motion abnormalities. Doppler parameters are consistent with restrictive physiology, indicative of decreased left ventricular diastolic compliance and/or increased left atrial pressure. - Aortic valve: Trivial regurgitation. - Mitral valve: Mild regurgitation. - Left atrium: The atrium was mildly dilated. - Pericardium, extracardiac: A small pericardial effusion was identified.  BRAIN MRI 02-2013 for syncope 1. No acute intracranial abnormality.  2. Remote hemorrhagic infarcts of the right basal ganglia have  evolved since the prior exam.  3. Otherwise stable atrophy and diffuse white matter disease.     Chantille Navarrete A. Merlene Laughter, M.D.  Diplomate, Tax adviser of Psychiatry and Neurology ( Neurology). 10/19/2013, 8:07 PM

## 2013-10-19 NOTE — Care Management Note (Signed)
    Page 1 of 1   10/28/2013     11:40:37 AM CARE MANAGEMENT NOTE 10/28/2013  Patient:  [name], [name]   Account Number:  [id number]  Date Initiated:  10/19/2013  Documentation initiated by:  Vladimir Creeks  Subjective/Objective Assessment:   Admitted with AKI and ?TIA. Pt is form home alone. His daughter lives next door, and he has very good family support. He plans to return home at D/C     Action/Plan:   May benefit from [facility] to follow after D/C- will follow for needs   Anticipated DC Date:  10/28/2013   Anticipated DC Plan:  [place]  In-house referral  Clinical Social Worker      DC Planning Services  CM consult      Choice offered to / List presented to:             Status of service:  Completed, signed off Medicare Important Message given?  YES (If response is "NO", the following Medicare IM given date fields will be blank) Date Medicare IM given:  10/23/2013 Medicare IM given by:  Vladimir Creeks Date Additional Medicare IM given:  10/28/2013 Additional Medicare IM given by:  Vladimir Creeks  Discharge Disposition:  [place]  Per UR Regulation:  Reviewed for med. necessity/level of care/duration of stay  If discussed at [place] of Stay Meetings, dates discussed:    Comments:  10/28/13 [place] RN/CM Pt and family decided that he would need rehab prior to going home. Pt lives alone and would like to continue to live at home, with family assistance for as long as possible, and hopes he can get strong enough to do so for a little while longer. He needs dialysis but is refusing this, and understands the consequenses of this. 10/19/13 [place] Roey Coopman RN/CM

## 2013-10-19 NOTE — Consult Note (Signed)
Reason for Consult: Acute kidney injury superimposed on chronic Referring Physician: Dr. Augusto Gamble [name] is an 80 y.o. male.  HPI: He is a patient who has history of her COPD, coronary disease status post stent placement, history of bradycardia status post pacemaker placement, history of chronic renal failure stage IV presently came with complaints of slurred speech, pain and difficulty moving his left arm. Presently patient is a that he's feeling much better but he complains of cough and some hemoptysis. According to patient was found to have pneumonia and treated with antibiotics for some time. Even though is feeling better as far as cough is concerned but she continued to have hemoptysis. Patient denies any difficulty breathing, fever chills or sweating. Presently patient developed acute kidney injury superimposed on chronic his consult is called.  Past Medical History  Diagnosis Date  . Gout   . Stroke 1993, 1995    chronic balance issues  . Hyperlipidemia   . Emphysema   . Hypertension   . CAD (coronary artery disease)     stent 2006  . Seizure disorder   . GERD (gastroesophageal reflux disease)   . Cancer   . Asthma   . Syncope Jan. 1, 2014  . AAA (abdominal aortic aneurysm)   . Sinus node dysfunction     s/p PPM implant April 2015 ([facility])    Past Surgical History  Procedure Laterality Date  . Cardiac catheterization  08/23/2004    bare metal stenting of the proximal circumflex  . Colonoscopy      X 2  . Colonoscopy  11/01/2011    Procedure: COLONOSCOPY;  Surgeon: Rogene Houston, MD;  Location: [facility];  Service: Endoscopy;  Laterality: N/A;  100  . Eye surgery    . Nose surgery    . Pacemaker insertion  04-29-13    STJ Assurity dual chamber pacemaker implanted by Dr Rayann Heman for syncope and sinus node dysfunction    Family History  Problem Relation Age of Onset  . Emphysema Brother   . Heart disease Sister   . Cancer Brother     lung  . Diabetes Brother    . Cancer Brother     throat  . Diabetes Brother   . Rheum arthritis Mother   . Arthritis Mother   . Rheum arthritis Father   . Colon cancer Neg Hx   . Diabetes    . Stroke Mother   . Heart attack    . Heart attack Sister     Social History:  reports that he quit smoking about 23 years ago. His smoking use included Cigarettes. He has a 15 pack-year smoking history. He has never used smokeless tobacco. He reports that he does not drink alcohol or use illicit drugs.  Allergies:  Allergies  Allergen Reactions  . Penicillins Itching    Medications: I have reviewed the patient's current medications.  Results for orders placed during the hospital encounter of 10/18/13 (from the past 48 hour(s))  CBC WITH DIFFERENTIAL     Status: Abnormal   Collection Time    10/18/13  2:04 PM      Result Value Ref Range   WBC 5.7  4.0 - 10.5 K/uL   RBC 2.44 (*) 4.22 - 5.81 MIL/uL   Hemoglobin 8.3 (*) 13.0 - 17.0 g/dL   HCT 24.5 (*) 39.0 - 52.0 %   MCV 100.4 (*) 78.0 - 100.0 fL   MCH 34.0  26.0 - 34.0 pg  MCHC 33.9  30.0 - 36.0 g/dL   RDW 15.7 (*) 11.5 - 15.5 %   Platelets 91 (*) 150 - 400 K/uL   Comment: PLATELET COUNT CONFIRMED BY SMEAR     SPECIMEN CHECKED FOR CLOTS   Neutrophils Relative % 64  43 - 77 %   Neutro Abs 3.7  1.7 - 7.7 K/uL   Lymphocytes Relative 23  12 - 46 %   Lymphs Abs 1.3  0.7 - 4.0 K/uL   Monocytes Relative 9  3 - 12 %   Monocytes Absolute 0.5  0.1 - 1.0 K/uL   Eosinophils Relative 4  0 - 5 %   Eosinophils Absolute 0.2  0.0 - 0.7 K/uL   Basophils Relative 0  0 - 1 %   Basophils Absolute 0.0  0.0 - 0.1 K/uL  COMPREHENSIVE METABOLIC PANEL     Status: Abnormal   Collection Time    10/18/13  2:04 PM      Result Value Ref Range   Sodium 141  137 - 147 mEq/L   Potassium 4.2  3.7 - 5.3 mEq/L   Chloride 101  96 - 112 mEq/L   CO2 27  19 - 32 mEq/L   Glucose, Bld 122 (*) 70 - 99 mg/dL   BUN 54 (*) 6 - 23 mg/dL   Creatinine, Ser 5.29 (*) 0.50 - 1.35 mg/dL   Calcium  9.2  8.4 - 10.5 mg/dL   Total Protein 6.7  6.0 - 8.3 g/dL   Albumin 3.7  3.5 - 5.2 g/dL   AST 18  0 - 37 U/L   ALT 15  0 - 53 U/L   Alkaline Phosphatase 118 (*) 39 - 117 U/L   Total Bilirubin 0.4  0.3 - 1.2 mg/dL   GFR calc non Af Amer 9 (*) >90 mL/min   GFR calc Af Amer 10 (*) >90 mL/min   Comment: (NOTE)     The eGFR has been calculated using the CKD EPI equation.     This calculation has not been validated in all clinical situations.     eGFR's persistently <90 mL/min signify possible Chronic Kidney     Disease.   Anion gap 13  5 - 15  TROPONIN I     Status: None   Collection Time    10/18/13  2:04 PM      Result Value Ref Range   Troponin I <0.30  <0.30 ng/mL   Comment:            Due to the release kinetics of cTnI,     a negative result within the first hours     of the onset of symptoms does not rule out     myocardial infarction with certainty.     If myocardial infarction is still suspected,     repeat the test at appropriate intervals.  URINALYSIS, ROUTINE W REFLEX MICROSCOPIC     Status: Abnormal   Collection Time    10/18/13  2:37 PM      Result Value Ref Range   Color, Urine YELLOW  YELLOW   APPearance CLEAR  CLEAR   Specific Gravity, Urine 1.015  1.005 - 1.030   pH 6.0  5.0 - 8.0   Glucose, UA NEGATIVE  NEGATIVE mg/dL   Hgb urine dipstick TRACE (*) NEGATIVE   Bilirubin Urine NEGATIVE  NEGATIVE   Ketones, ur NEGATIVE  NEGATIVE mg/dL   Protein, ur NEGATIVE  NEGATIVE mg/dL   Urobilinogen, UA 0.2  0.0 - 1.0 mg/dL   Nitrite NEGATIVE  NEGATIVE   Leukocytes, UA NEGATIVE  NEGATIVE  URINE MICROSCOPIC-ADD ON     Status: Abnormal   Collection Time    10/18/13  2:37 PM      Result Value Ref Range   WBC, UA 7-10  <3 WBC/hpf   RBC / HPF 0-2  <3 RBC/hpf   Bacteria, UA FEW (*) RARE  BASIC METABOLIC PANEL     Status: Abnormal   Collection Time    10/19/13  5:59 AM      Result Value Ref Range   Sodium 143  137 - 147 mEq/L   Potassium 4.4  3.7 - 5.3 mEq/L   Chloride  104  96 - 112 mEq/L   CO2 24  19 - 32 mEq/L   Glucose, Bld 102 (*) 70 - 99 mg/dL   BUN 54 (*) 6 - 23 mg/dL   Creatinine, Ser 4.93 (*) 0.50 - 1.35 mg/dL   Calcium 8.7  8.4 - 10.5 mg/dL   GFR calc non Af Amer 9 (*) >90 mL/min   GFR calc Af Amer 11 (*) >90 mL/min   Comment: (NOTE)     The eGFR has been calculated using the CKD EPI equation.     This calculation has not been validated in all clinical situations.     eGFR's persistently <90 mL/min signify possible Chronic Kidney     Disease.   Anion gap 15  5 - 15  CBC     Status: Abnormal   Collection Time    10/19/13  5:59 AM      Result Value Ref Range   WBC 5.5  4.0 - 10.5 K/uL   RBC 2.24 (*) 4.22 - 5.81 MIL/uL   Hemoglobin 7.9 (*) 13.0 - 17.0 g/dL   HCT 22.4 (*) 39.0 - 52.0 %   MCV 100.0  78.0 - 100.0 fL   MCH 35.3 (*) 26.0 - 34.0 pg   MCHC 35.3  30.0 - 36.0 g/dL   RDW 15.8 (*) 11.5 - 15.5 %   Platelets 97 (*) 150 - 400 K/uL   Comment: SPECIMEN CHECKED FOR CLOTS     CONSISTENT WITH PREVIOUS RESULT    Dg Chest 2 View  10/18/2013   CLINICAL DATA:  Right-sided arm pain weakness and difficulty speaking today, in patient with history of numerous strokes  EXAM: CHEST  2 VIEW  COMPARISON:  09/17/2013  FINDINGS: Moderate cardiac enlargement. Cardiac pacer in unchanged position. Vascular pattern is normal.  Mild diffuse interstitial prominence is similar to prior study with no focal consolidation. Tiny bilateral pleural effusions suspected.  IMPRESSION: Cardiac enlargement with tiny pleural effusions. Mild interstitial change bilaterally, similar but slightly more prominent from prior study, could reflect minimal interstitial edema.   Electronically Signed   By: Skipper Cliche M.D.   On: 10/18/2013 15:13   Ct Head Wo Contrast  10/18/2013   CLINICAL DATA:  Acute dysphagia this afternoon, history of 3 strokes previously with residual left facial droop  EXAM: CT HEAD WITHOUT CONTRAST  TECHNIQUE: Contiguous axial images were obtained from the  base of the skull through the vertex without intravenous contrast.  COMPARISON:  03/13/2013 CT and MRI  FINDINGS: Numerous chronic bilateral lacunar infarcts in the basal ganglia region, stable. Severe atrophy stable. Periventricular white matter low attenuations stable.  No evidence of hemorrhage or extra-axial fluid. No acute vascular territory infarct. Calvarium is intact.  IMPRESSION: Severe atrophy with numerous chronic lacunar infarcts, findings  stable from prior studies.   Electronically Signed   By: Skipper Cliche M.D.   On: 10/18/2013 15:25   US Carotid Bilateral  10/19/2013   CLINICAL DATA:  80 year old male with slurred speech, and difficulty using his right arm.  EXAM: BILATERAL CAROTID DUPLEX ULTRASOUND  TECHNIQUE: Pearline Cables scale imaging, color Doppler and duplex ultrasound were performed of bilateral carotid and vertebral arteries in the neck.  COMPARISON:  Head CT 10/18/2013 ; prior carotid ultrasound 07/17/2012  FINDINGS: Criteria: Quantification of carotid stenosis is based on velocity parameters that correlate the residual internal carotid diameter with NASCET-based stenosis levels, using the diameter of the distal internal carotid lumen as the denominator for stenosis measurement.  The following velocity measurements were obtained:  RIGHT  ICA:  114/25 cm/sec  CCA:  63/14 cm/sec  SYSTOLIC ICA/CCA RATIO:  1.6  DIASTOLIC ICA/CCA RATIO:  1.9  ECA:  91 cm/sec  LEFT  ICA:  139/38 cm/sec  CCA:  97/02 cm/sec  SYSTOLIC ICA/CCA RATIO:  1.8  DIASTOLIC ICA/CCA RATIO:  2.1  ECA:  104 cm/sec  RIGHT CAROTID ARTERY: Heterogeneous, irregular and partially calcified plaque in the distal common carotid artery extending into the carotid bulb and proximal internal and external carotid arteries. By peak systolic velocity criteria, the estimated diameter stenosis remains less than 50%. A portion of the most proximal internal carotid artery is obscured by shadow artifact related to the calcified plaque. The spectral  waveforms distal to this location do not suggest high-grade stenosis.  RIGHT VERTEBRAL ARTERY:  Patent with normal antegrade flow.  LEFT CAROTID ARTERY: Heterogeneous and irregular atherosclerotic plaque in the left common carotid artery extending into the proximal internal and external carotid arteries. By peak systolic velocity criteria, the estimated stenosis is 50-69%.  LEFT VERTEBRAL ARTERY:  Patent with normal antegrade flow.  IMPRESSION: 1. Heterogeneous and irregular atherosclerotic plaque results in an estimated 50-69% diameter left internal carotid artery stenosis. This represents interval progression from less than 50% stenosis seen on 07/17/2012. 2. Similar less than 50% diameter stenosis of the proximal right internal carotid artery. 3. Vertebral arteries remain patent with normal antegrade flow. Signed,  Criselda Peaches, MD  [facility]  [facility]   Electronically Signed   By: Jacqulynn Cadet M.D.   On: 10/19/2013 12:15    Review of Systems  Constitutional: Negative for fever and chills.  Respiratory: Positive for cough, hemoptysis and sputum production. Negative for shortness of breath.   Cardiovascular: Negative for chest pain and orthopnea.  Gastrointestinal: Negative for nausea and vomiting.  Musculoskeletal:       Weakness of his left hand has improved  Neurological: Positive for speech change.   Blood pressure 118/64, pulse 70, temperature 98.8 F (37.1 C), temperature source Oral, resp. rate 18, height 5' 11.5" (1.816 m), weight 80.287 kg (177 lb), SpO2 94.00%. Physical Exam  Constitutional: He is oriented to person, place, and time. No distress.  Eyes: No scleral icterus.  Neck: No JVD present.  Cardiovascular: Normal rate and regular rhythm.   No murmur heard. Respiratory: No respiratory distress. He has no wheezes. He has no rales.  GI: He exhibits no distension. There is no tenderness.  Musculoskeletal: He exhibits  no edema.  Neurological: He is alert and oriented to person, place, and time.    Assessment/Plan: Problem #1 acute kidney injury: Etiology as this moment is not clear. Probably prerenal/ATN/interstitial nephritis from antibiotics. Presently his renal function seems to be somewhat better and patient doesn't  have any uremic sign and symptoms. Problem #2 chronic renal failure: His creatinine in 2009 was 1.6, last creatinine 04/2013 was 2.77 with EGFR was 19 cc per minute. He is stage IV. Etiology could be diabetes/hypertension/ischemic. Problem #3 history of coronary artery disease status post stent placement her chest pain Problem #4 history of bradycardia status post pacemaker placement Problem #5 history of TIA improving Problem #6 COPD Problem #7 anemia: His hemoglobin and hematocrit is declining. Etiology not clear whether this is iron deficiency anemia or anemia of chronic renal failure. Problem #8 hemoptysis : Patient with history of pneumonia and hemoptysis for more than 1 months. Recent chest x-ray bilateral diffuse interstitial infiltrate. CT scan of the chest from last month's shows similar multiple other findings including multiple tiny ill-defined nodules at the inferior aspect of the right upper lobe. 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 Plan: Agree with hydration We'll check ANA, complement, ANCA, Increase his ns to 125 cc/hr Check his basic metabolic panel, phosphorus and iron studies in am   [facility] S 10/19/2013, 2:07 PM

## 2013-10-20 ENCOUNTER — Inpatient Hospital Stay (HOSPITAL_COMMUNITY)
Admission: EM | Admit: 2013-10-20 | Discharge: 2013-10-20 | Disposition: A | Discharge status: Home / Self Care | Payer: Medicare Other | Source: Home / Self Care | Attending: Neurology | Admitting: Neurology

## 2013-10-20 ENCOUNTER — Inpatient Hospital Stay (HOSPITAL_COMMUNITY): Payer: Medicare Other

## 2013-10-20 LAB — BASIC METABOLIC PANEL
Anion gap: 13 (ref 5–15)
BUN: 51 mg/dL — AB (ref 6–23)
CALCIUM: 8.8 mg/dL (ref 8.4–10.5)
CO2: 24 mEq/L (ref 19–32)
Chloride: 104 mEq/L (ref 96–112)
Creatinine, Ser: 4.57 mg/dL — ABNORMAL HIGH (ref 0.50–1.35)
GFR calc Af Amer: 12 mL/min — ABNORMAL LOW (ref >90)
GFR, EST NON AFRICAN AMERICAN: 10 mL/min — AB (ref >90)
GLUCOSE: 111 mg/dL — AB (ref 70–99)
Potassium: 4.2 mEq/L (ref 3.7–5.3)
Sodium: 141 mEq/L (ref 137–147)

## 2013-10-20 LAB — IRON AND TIBC
IRON: 53 ug/dL (ref 42–135)
Saturation Ratios: 26 % (ref 20–55)
TIBC: 206 ug/dL — AB (ref 215–435)
UIBC: 153 ug/dL (ref 125–400)

## 2013-10-20 LAB — GLOMERULAR BASEMENT MEMBRANE ANTIBODIES: GBM Ab: <1

## 2013-10-20 LAB — ANA: Anti Nuclear Antibody(ANA): NEGATIVE

## 2013-10-20 LAB — FERRITIN: FERRITIN: 248 ng/mL (ref 22–322)

## 2013-10-20 LAB — MPO/PR-3 (ANCA) ANTIBODIES: Myeloperoxidase Abs: <1

## 2013-10-20 LAB — C3 COMPLEMENT: C3 Complement: 104 mg/dL (ref 90–180)

## 2013-10-20 LAB — C4 COMPLEMENT: Complement C4, Body Fluid: 48 mg/dL — ABNORMAL HIGH (ref 10–40)

## 2013-10-20 LAB — PHOSPHORUS: PHOSPHORUS: 4.4 mg/dL (ref 2.3–4.6)

## 2013-10-20 MED ORDER — FUROSEMIDE 10 MG/ML IJ SOLN
40.0000 mg | Freq: Two times a day (BID) | INTRAMUSCULAR | Status: DC
  Administered 2013-10-20 – 2013-10-22 (×5): 40 mg via INTRAVENOUS
  Filled 2013-10-20 (×5): qty 4

## 2013-10-20 MED ORDER — LEVOFLOXACIN IN D5W 500 MG/100ML IV SOLN
500.0000 mg | INTRAVENOUS | Status: DC
  Administered 2013-10-20 – 2013-10-24 (×3): 500 mg via INTRAVENOUS
  Filled 2013-10-20 (×4): qty 100

## 2013-10-20 MED ORDER — ALBUTEROL SULFATE (2.5 MG/3ML) 0.083% IN NEBU
2.5000 mg | INHALATION_SOLUTION | Freq: Four times a day (QID) | RESPIRATORY_TRACT | Status: DC
  Administered 2013-10-20 – 2013-10-27 (×27): 2.5 mg via RESPIRATORY_TRACT
  Filled 2013-10-20 (×27): qty 3

## 2013-10-20 NOTE — Progress Notes (Signed)
Subjective: He says he doesn't feel well. He's very weak and more short of breath. He is still coughing up blood. He's been coughing up blood now for about 3 months.  Objective: Vital signs in last 24 hours: Temp:  [97.5 F (36.4 C)-97.7 F (36.5 C)] 97.5 F (36.4 C) (10/06 0558) Pulse Rate:  [72-94] 85 (10/06 0558) Resp:  [18-20] 20 (10/06 0558) BP: (110-119)/(59-63) 110/59 mmHg (10/06 0558) SpO2:  [95 %-98 %] 95 % (10/06 0702) Weight change:  Last BM Date: 10/16/13  Intake/Output from previous day: 10/05 0701 - 10/06 0700 In: 803 [I.V.:803] Out: -   PHYSICAL EXAM General appearance: alert, cooperative and mild distress Resp: rhonchi bilaterally Cardio: regular rate and rhythm, S1, S2 normal, no murmur, click, rub or gallop GI: soft, non-tender; bowel sounds normal; no masses,  no organomegaly Extremities: extremities normal, atraumatic, no cyanosis or edema  Lab Results:  Results for orders placed during the hospital encounter of 10/18/13 (from the past 48 hour(s))  CBC WITH DIFFERENTIAL     Status: Abnormal   Collection Time    10/18/13  2:04 PM      Result Value Ref Range   WBC 5.7  4.0 - 10.5 K/uL   RBC 2.44 (*) 4.22 - 5.81 MIL/uL   Hemoglobin 8.3 (*) 13.0 - 17.0 g/dL   HCT 24.5 (*) 39.0 - 52.0 %   MCV 100.4 (*) 78.0 - 100.0 fL   MCH 34.0  26.0 - 34.0 pg   MCHC 33.9  30.0 - 36.0 g/dL   RDW 15.7 (*) 11.5 - 15.5 %   Platelets 91 (*) 150 - 400 K/uL   Comment: PLATELET COUNT CONFIRMED BY SMEAR     SPECIMEN CHECKED FOR CLOTS   Neutrophils Relative % 64  43 - 77 %   Neutro Abs 3.7  1.7 - 7.7 K/uL   Lymphocytes Relative 23  12 - 46 %   Lymphs Abs 1.3  0.7 - 4.0 K/uL   Monocytes Relative 9  3 - 12 %   Monocytes Absolute 0.5  0.1 - 1.0 K/uL   Eosinophils Relative 4  0 - 5 %   Eosinophils Absolute 0.2  0.0 - 0.7 K/uL   Basophils Relative 0  0 - 1 %   Basophils Absolute 0.0  0.0 - 0.1 K/uL  COMPREHENSIVE METABOLIC PANEL     Status: Abnormal   Collection Time     10/18/13  2:04 PM      Result Value Ref Range   Sodium 141  137 - 147 mEq/L   Potassium 4.2  3.7 - 5.3 mEq/L   Chloride 101  96 - 112 mEq/L   CO2 27  19 - 32 mEq/L   Glucose, Bld 122 (*) 70 - 99 mg/dL   BUN 54 (*) 6 - 23 mg/dL   Creatinine, Ser 5.29 (*) 0.50 - 1.35 mg/dL   Calcium 9.2  8.4 - 10.5 mg/dL   Total Protein 6.7  6.0 - 8.3 g/dL   Albumin 3.7  3.5 - 5.2 g/dL   AST 18  0 - 37 U/L   ALT 15  0 - 53 U/L   Alkaline Phosphatase 118 (*) 39 - 117 U/L   Total Bilirubin 0.4  0.3 - 1.2 mg/dL   GFR calc non Af Amer 9 (*) >90 mL/min   GFR calc Af Amer 10 (*) >90 mL/min   Comment: (NOTE)     The eGFR has been calculated using the CKD EPI equation.  This calculation has not been validated in all clinical situations.     eGFR's persistently <90 mL/min signify possible Chronic Kidney     Disease.   Anion gap 13  5 - 15  TROPONIN I     Status: None   Collection Time    10/18/13  2:04 PM      Result Value Ref Range   Troponin I <0.30  <0.30 ng/mL   Comment:            Due to the release kinetics of cTnI,     a negative result within the first hours     of the onset of symptoms does not rule out     myocardial infarction with certainty.     If myocardial infarction is still suspected,     repeat the test at appropriate intervals.  URINALYSIS, ROUTINE W REFLEX MICROSCOPIC     Status: Abnormal   Collection Time    10/18/13  2:37 PM      Result Value Ref Range   Color, Urine YELLOW  YELLOW   APPearance CLEAR  CLEAR   Specific Gravity, Urine 1.015  1.005 - 1.030   pH 6.0  5.0 - 8.0   Glucose, UA NEGATIVE  NEGATIVE mg/dL   Hgb urine dipstick TRACE (*) NEGATIVE   Bilirubin Urine NEGATIVE  NEGATIVE   Ketones, ur NEGATIVE  NEGATIVE mg/dL   Protein, ur NEGATIVE  NEGATIVE mg/dL   Urobilinogen, UA 0.2  0.0 - 1.0 mg/dL   Nitrite NEGATIVE  NEGATIVE   Leukocytes, UA NEGATIVE  NEGATIVE  URINE MICROSCOPIC-ADD ON     Status: Abnormal   Collection Time    10/18/13  2:37 PM      Result  Value Ref Range   WBC, UA 7-10  <3 WBC/hpf   RBC / HPF 0-2  <3 RBC/hpf   Bacteria, UA FEW (*) RARE  BASIC METABOLIC PANEL     Status: Abnormal   Collection Time    10/19/13  5:59 AM      Result Value Ref Range   Sodium 143  137 - 147 mEq/L   Potassium 4.4  3.7 - 5.3 mEq/L   Chloride 104  96 - 112 mEq/L   CO2 24  19 - 32 mEq/L   Glucose, Bld 102 (*) 70 - 99 mg/dL   BUN 54 (*) 6 - 23 mg/dL   Creatinine, Ser 4.93 (*) 0.50 - 1.35 mg/dL   Calcium 8.7  8.4 - 10.5 mg/dL   GFR calc non Af Amer 9 (*) >90 mL/min   GFR calc Af Amer 11 (*) >90 mL/min   Comment: (NOTE)     The eGFR has been calculated using the CKD EPI equation.     This calculation has not been validated in all clinical situations.     eGFR's persistently <90 mL/min signify possible Chronic Kidney     Disease.   Anion gap 15  5 - 15  CBC     Status: Abnormal   Collection Time    10/19/13  5:59 AM      Result Value Ref Range   WBC 5.5  4.0 - 10.5 K/uL   RBC 2.24 (*) 4.22 - 5.81 MIL/uL   Hemoglobin 7.9 (*) 13.0 - 17.0 g/dL   HCT 22.4 (*) 39.0 - 52.0 %   MCV 100.0  78.0 - 100.0 fL   MCH 35.3 (*) 26.0 - 34.0 pg   MCHC 35.3  30.0 - 36.0 g/dL  RDW 15.8 (*) 11.5 - 15.5 %   Platelets 97 (*) 150 - 400 K/uL   Comment: SPECIMEN CHECKED FOR CLOTS     CONSISTENT WITH PREVIOUS RESULT  C4 COMPLEMENT     Status: Abnormal   Collection Time    10/19/13  3:18 PM      Result Value Ref Range   Complement C4, Body Fluid 48 (*) 10 - 40 mg/dL   Comment: Performed at [facility]     Status: None   Collection Time    10/19/13  3:18 PM      Result Value Ref Range   C3 Complement 104  90 - 180 mg/dL   Comment: Performed at [place]     Status: None   Collection Time    10/20/13  5:50 AM      Result Value Ref Range   Phosphorus 4.4  2.3 - 4.6 mg/dL  BASIC METABOLIC PANEL     Status: Abnormal   Collection Time    10/20/13  5:50 AM      Result Value Ref Range   Sodium 141  137 - 147  mEq/L   Potassium 4.2  3.7 - 5.3 mEq/L   Chloride 104  96 - 112 mEq/L   CO2 24  19 - 32 mEq/L   Glucose, Bld 111 (*) 70 - 99 mg/dL   BUN 51 (*) 6 - 23 mg/dL   Creatinine, Ser 4.57 (*) 0.50 - 1.35 mg/dL   Calcium 8.8  8.4 - 10.5 mg/dL   GFR calc non Af Amer 10 (*) >90 mL/min   GFR calc Af Amer 12 (*) >90 mL/min   Comment: (NOTE)     The eGFR has been calculated using the CKD EPI equation.     This calculation has not been validated in all clinical situations.     eGFR's persistently <90 mL/min signify possible Chronic Kidney     Disease.   Anion gap 13  5 - 15    ABGS No results found for this basename: PHART, PCO2, PO2ART, TCO2, HCO3,  in the last 72 hours CULTURES No results found for this or any previous visit (from the past 240 hour(s)). Studies/Results: Dg Chest 2 View  10/18/2013   CLINICAL DATA:  Right-sided arm pain weakness and difficulty speaking today, in patient with history of numerous strokes  EXAM: CHEST  2 VIEW  COMPARISON:  09/17/2013  FINDINGS: Moderate cardiac enlargement. Cardiac pacer in unchanged position. Vascular pattern is normal.  Mild diffuse interstitial prominence is similar to prior study with no focal consolidation. Tiny bilateral pleural effusions suspected.  IMPRESSION: Cardiac enlargement with tiny pleural effusions. Mild interstitial change bilaterally, similar but slightly more prominent from prior study, could reflect minimal interstitial edema.   Electronically Signed   By: Skipper Cliche M.D.   On: 10/18/2013 15:13   Ct Head Wo Contrast  10/18/2013   CLINICAL DATA:  Acute dysphagia this afternoon, history of 3 strokes previously with residual left facial droop  EXAM: CT HEAD WITHOUT CONTRAST  TECHNIQUE: Contiguous axial images were obtained from the base of the skull through the vertex without intravenous contrast.  COMPARISON:  03/13/2013 CT and MRI  FINDINGS: Numerous chronic bilateral lacunar infarcts in the basal ganglia region, stable. Severe  atrophy stable. Periventricular white matter low attenuations stable.  No evidence of hemorrhage or extra-axial fluid. No acute vascular territory infarct. Calvarium is intact.  IMPRESSION: Severe atrophy with numerous chronic  lacunar infarcts, findings stable from prior studies.   Electronically Signed   By: Skipper Cliche M.D.   On: 10/18/2013 15:25   US Carotid Bilateral  10/19/2013   CLINICAL DATA:  80 year old male with slurred speech, and difficulty using his right arm.  EXAM: BILATERAL CAROTID DUPLEX ULTRASOUND  TECHNIQUE: Pearline Cables scale imaging, color Doppler and duplex ultrasound were performed of bilateral carotid and vertebral arteries in the neck.  COMPARISON:  Head CT 10/18/2013 ; prior carotid ultrasound 07/17/2012  FINDINGS: Criteria: Quantification of carotid stenosis is based on velocity parameters that correlate the residual internal carotid diameter with NASCET-based stenosis levels, using the diameter of the distal internal carotid lumen as the denominator for stenosis measurement.  The following velocity measurements were obtained:  RIGHT  ICA:  114/25 cm/sec  CCA:  37/34 cm/sec  SYSTOLIC ICA/CCA RATIO:  1.6  DIASTOLIC ICA/CCA RATIO:  1.9  ECA:  91 cm/sec  LEFT  ICA:  139/38 cm/sec  CCA:  28/76 cm/sec  SYSTOLIC ICA/CCA RATIO:  1.8  DIASTOLIC ICA/CCA RATIO:  2.1  ECA:  104 cm/sec  RIGHT CAROTID ARTERY: Heterogeneous, irregular and partially calcified plaque in the distal common carotid artery extending into the carotid bulb and proximal internal and external carotid arteries. By peak systolic velocity criteria, the estimated diameter stenosis remains less than 50%. A portion of the most proximal internal carotid artery is obscured by shadow artifact related to the calcified plaque. The spectral waveforms distal to this location do not suggest high-grade stenosis.  RIGHT VERTEBRAL ARTERY:  Patent with normal antegrade flow.  LEFT CAROTID ARTERY: Heterogeneous and irregular atherosclerotic plaque  in the left common carotid artery extending into the proximal internal and external carotid arteries. By peak systolic velocity criteria, the estimated stenosis is 50-69%.  LEFT VERTEBRAL ARTERY:  Patent with normal antegrade flow.  IMPRESSION: 1. Heterogeneous and irregular atherosclerotic plaque results in an estimated 50-69% diameter left internal carotid artery stenosis. This represents interval progression from less than 50% stenosis seen on 07/17/2012. 2. Similar less than 50% diameter stenosis of the proximal right internal carotid artery. 3. Vertebral arteries remain patent with normal antegrade flow. Signed,  Criselda Peaches, MD  [facility]  [facility]   Electronically Signed   By: Jacqulynn Cadet M.D.   On: 10/19/2013 12:15    Medications:  Prior to Admission:  Prescriptions prior to admission  Medication Sig Dispense Refill  . allopurinol (ZYLOPRIM) 300 MG tablet Take 300 mg by mouth every morning.       . clopidogrel (PLAVIX) 75 MG tablet Take 75 mg by mouth every morning.       . donepezil (ARICEPT) 10 MG tablet Take 10 mg by mouth at bedtime.       . famotidine (PEPCID) 20 MG tablet TAKE ONE TABLET BY MOUTH TWICE DAILY.  60 tablet  5  . furosemide (LASIX) 40 MG tablet Take 20 mg by mouth daily.       . isosorbide mononitrate (IMDUR) 30 MG 24 hr tablet Take 0.5 tablets (15 mg total) by mouth daily.  30 tablet  11  . Linaclotide (LINZESS) 145 MCG CAPS capsule Take 1 capsule (145 mcg total) by mouth daily.  30 capsule  5  . metoprolol tartrate (LOPRESSOR) 25 MG tablet Take 0.5 tablets (12.5 mg total) by mouth 2 (two) times daily.  60 tablet  3  . nitroGLYCERIN (NITROSTAT) 0.4 MG SL tablet Place 1 tablet (0.4 mg total) under the tongue every  5 (five) minutes as needed for chest pain. 1 tablet under tongue at onset of chest pain;you may repeat every 5 minutes for up to 3 doses.  25 tablet  3  . Oxcarbazepine (TRILEPTAL) 300 MG tablet  Take 300 mg by mouth 2 (two) times daily.       Marland Kitchen oxybutynin (DITROPAN) 5 MG tablet Take 5 mg by mouth 2 (two) times daily.       . pravastatin (PRAVACHOL) 40 MG tablet Take 40 mg by mouth daily. Daily at bedtime      . Tamsulosin HCl (FLOMAX) 0.4 MG CAPS Take 0.4 mg by mouth at bedtime.       Marland Kitchen tiotropium (SPIRIVA HANDIHALER) 18 MCG inhalation capsule Place 1 capsule (18 mcg total) into inhaler and inhale daily.  30 capsule  6  . VITAMIN B1-B12 IM Inject into the muscle every 30 (thirty) days. Once a month       Scheduled: . albuterol  2.5 mg Nebulization QID  . allopurinol  300 mg Oral q morning - 10a  . aspirin  81 mg Oral Daily  . clopidogrel  75 mg Oral q morning - 10a  . donepezil  10 mg Oral QHS  . famotidine  20 mg Oral Daily  . furosemide  40 mg Intravenous BID  . isosorbide mononitrate  15 mg Oral Daily  . levofloxacin (LEVAQUIN) IV  500 mg Intravenous Q48H  . Linaclotide  145 mcg Oral Daily  . metoprolol tartrate  12.5 mg Oral BID  . Oxcarbazepine  300 mg Oral BID  . oxybutynin  5 mg Oral BID  . simvastatin  20 mg Oral q1800  . sodium chloride  3 mL Intravenous Q12H  . tamsulosin  0.4 mg Oral QHS  . tiotropium  18 mcg Inhalation Daily   Continuous: . sodium chloride 125 mL/hr at 10/19/13 2226   OEH:[id number], acetaminophen, alum & mag hydroxide-simeth, docusate sodium, nitroGLYCERIN, ondansetron (ZOFRAN) IV, ondansetron  Assesment: He was admitted with TIA. He was also found to have acute on chronic renal failure. He has hypertension, previous episodes of seizure disorder, mild dementia, and has had hemoptysis for a period of about 3 months. Principal Problem:   TIA (transient ischemic attack) Active Problems:   Acute kidney injury   Anemia   Hemoptysis    Plan: He will have CT without contrast. On trying to see if we see something that would explain his hemoptysis. I will add Levaquin. Add nebulizer treatments.    LOS: 2 days   ,  L 10/20/2013, 8:45 AM

## 2013-10-20 NOTE — Procedures (Signed)
Belmond A. Merlene Laughter, MD     [email or web address]           HISTORY: The patient is an 80 year old who has a baseline history of seizures. He presents with altered mental status and confusion worrisome for seizure activity.  MEDICATIONS: Scheduled Meds: . albuterol  2.5 mg Nebulization QID  . allopurinol  300 mg Oral q morning - 10a  . aspirin  81 mg Oral Daily  . clopidogrel  75 mg Oral q morning - 10a  . donepezil  10 mg Oral QHS  . famotidine  20 mg Oral Daily  . furosemide  40 mg Intravenous BID  . isosorbide mononitrate  15 mg Oral Daily  . levofloxacin (LEVAQUIN) IV  500 mg Intravenous Q48H  . Linaclotide  145 mcg Oral Daily  . metoprolol tartrate  12.5 mg Oral BID  . Oxcarbazepine  300 mg Oral BID  . oxybutynin  5 mg Oral BID  . simvastatin  20 mg Oral q1800  . sodium chloride  3 mL Intravenous Q12H  . tamsulosin  0.4 mg Oral QHS  . tiotropium  18 mcg Inhalation Daily   Continuous Infusions: . sodium chloride 135 mL/hr at 10/20/13 1428   PRN Meds:.acetaminophen, acetaminophen, alum & mag hydroxide-simeth, docusate sodium, nitroGLYCERIN, ondansetron (ZOFRAN) IV, ondansetron  Prior to Admission medications   Medication Sig Start Date End Date Taking? Authorizing Provider  allopurinol (ZYLOPRIM) 300 MG tablet Take 300 mg by mouth every morning.    Yes Historical Provider, MD  clopidogrel (PLAVIX) 75 MG tablet Take 75 mg by mouth every morning.    Yes Historical Provider, MD  donepezil (ARICEPT) 10 MG tablet Take 10 mg by mouth at bedtime.    Yes Historical Provider, MD  famotidine (PEPCID) 20 MG tablet TAKE ONE TABLET BY MOUTH TWICE DAILY.   Yes Elsie Stain, MD  furosemide (LASIX) 40 MG tablet Take 20 mg by mouth daily.  08/26/13  Yes Scott Joylene Draft, PA-C  isosorbide mononitrate (IMDUR) 30 MG 24 hr tablet Take 0.5 tablets (15 mg total) by mouth daily. 08/26/13  Yes Scott T Weaver, PA-C  Linaclotide (LINZESS) 145 MCG CAPS capsule Take 1 capsule  (145 mcg total) by mouth daily. 08/03/13  Yes Butch Penny, NP  metoprolol tartrate (LOPRESSOR) 25 MG tablet Take 0.5 tablets (12.5 mg total) by mouth 2 (two) times daily. 09/24/13  Yes Arnoldo Lenis, MD  nitroGLYCERIN (NITROSTAT) 0.4 MG SL tablet Place 1 tablet (0.4 mg total) under the tongue every 5 (five) minutes as needed for chest pain. 1 tablet under tongue at onset of chest pain;you may repeat every 5 minutes for up to 3 doses. 10/03/12  Yes Herminio Commons, MD  Oxcarbazepine (TRILEPTAL) 300 MG tablet Take 300 mg by mouth 2 (two) times daily.    Yes Historical Provider, MD  oxybutynin (DITROPAN) 5 MG tablet Take 5 mg by mouth 2 (two) times daily.    Yes Historical Provider, MD  pravastatin (PRAVACHOL) 40 MG tablet Take 40 mg by mouth daily. Daily at bedtime   Yes Historical Provider, MD  Tamsulosin HCl (FLOMAX) 0.4 MG CAPS Take 0.4 mg by mouth at bedtime.    Yes Historical Provider, MD  tiotropium (SPIRIVA HANDIHALER) 18 MCG inhalation capsule Place 1 capsule (18 mcg total) into inhaler and inhale daily. 03/06/13  Yes Elsie Stain, MD  VITAMIN B1-B12 IM Inject into the muscle every 30 (thirty) days. Once a month   Yes Historical Provider, MD  ANALYSIS: A 16 channel recording using standard 10 20 measurements is conducted for 20 minutes. The background activity gases high as 6-1/2-7 Hz. Awake and sleep activities are observed. There is significant amount of K complexes and spindling observed. Photic stimulation and hyperventilation were not carried out. There is a single sharp wave activity that phase reverses at T5 and associated only with a mild spread. No electrographic seizures are observed.   IMPRESSION: This recording is mildly abnormal for the following reasons: 1. Single epileptiform discharge involving the left temporal region. However, there is no evidence of ongoing electrographic seizures. This is most likely commensurate with his underlying history of seizures. 2.  Mild generalized slowing indicating a mild generalized encephalopathy.      Jarius Dieudonne A. Merlene Laughter, M.D.  Diplomate, Tax adviser of Psychiatry and Neurology ( Neurology).

## 2013-10-20 NOTE — Clinical Documentation Improvement (Signed)
  Please clarify Primary diagnosis. H&P/PN state "TIA". Neurology Note 10/5 states "Likely another lacunar infarct" with CT showing "a large lacunar infarct involving the basal ganglia on the right side" and "right tiny thalamic lacunar infarct".  Thank you, Barrie Dunker RN [facility] [phone] HIM department

## 2013-10-20 NOTE — Progress Notes (Signed)
EEG Completed; Results Pending  

## 2013-10-20 NOTE — Progress Notes (Signed)
Patient ID: [name], male   DOB: [birth year], 80 y.o.   MRN: [id number]  James A. Merlene Laughter, MD     [email or web address]          [name] is an 80 y.o. male.   Assessment/Plan: 1. Likely another lacunar infarct and less likely TIA.  2. The patient seemed amnestic to the current event which is concerning.  3. Cognitive impairment at baseline.  4. Peripheral neuropathy.  5. Seizure disorder. Presumably this is being treated with a Trileptal/oxcarbazepine.   He still complains of hemoptysis. Dr. Luan Pulling Has ordered a CT of the chest.  Aspirin was combination is suggestive for 3 months. Afterwards Plavix only.  EEG ---- Mild slowing in single left temporal sharp wave activity. Observation for now. No new changes to his medications.  GENERAL: He is in no acute distress.  HEENT: Supple. Atraumatic normocephalic.  ABDOMEN: soft  EXTREMITIES: No edema  BACK: Normal.  SKIN: Normal by inspection.  MENTAL STATUS: He is sleeping but easily arousable to verbal commands. He follows commands well. He noted that his 62. He thinks his September. He knows that he is in the hospital at [facility].  CRANIAL NERVES: Pupils are equal, round and reactive to light and accommodation; extra ocular movements are full, there is no significant nystagmus; visual fields are full; upper and lower facial muscles are normal in strength and symmetric, there is no flattening of the nasolabial folds; tongue is midline; uvula is midline; shoulder elevation is normal.  MOTOR: Normal tone, bulk and strength- moderate weakness/atrophy of the FDI bilaterally; there is a left upper extremity pronator drift.  COORDINATION: Left finger to nose is normal, right finger to nose is normal, No rest tremor; no intention tremor; no postural tremor; no bradykinesia.  REFLEXES: Deep tendon reflexes are symmetrical and normal. Babinski reflexes are flexor bilaterally.  SENSATION: Normal to light touch.    Head CT scan is reviewed in person there is a large lacunar infarcts involving the basal ganglia on the right side. It is also less basic infarct in the right tiny thalamic lacunar infarct. There is moderate global atrophy.        Objective: Vital signs in last 24 hours: Temp:  [97.5 F (36.4 C)-97.6 F (36.4 C)] 97.5 F (36.4 C) (10/06 1545) Pulse Rate:  [73-94] 81 (10/06 1545) Resp:  [20] 20 (10/06 1545) BP: (106-119)/(41-63) 108/49 mmHg (10/06 1545) SpO2:  [91 %-97 %] 92 % (10/06 1547)  Intake/Output from previous day: 10/05 0701 - 10/06 0700 In: 803 [I.V.:803] Out: -  Intake/Output this shift:   Nutritional status: Cardiac   Lab Results: Results for orders placed during the hospital encounter of 10/18/13 (from the past 48 hour(s))  BASIC METABOLIC PANEL     Status: Abnormal   Collection Time    10/19/13  5:59 AM      Result Value Ref Range   Sodium 143  137 - 147 mEq/L   Potassium 4.4  3.7 - 5.3 mEq/L   Chloride 104  96 - 112 mEq/L   CO2 24  19 - 32 mEq/L   Glucose, Bld 102 (*) 70 - 99 mg/dL   BUN 54 (*) 6 - 23 mg/dL   Creatinine, Ser 4.93 (*) 0.50 - 1.35 mg/dL   Calcium 8.7  8.4 - 10.5 mg/dL   GFR calc non Af Amer 9 (*) >90 mL/min   GFR calc Af Amer 11 (*) >90 mL/min   Comment: (NOTE)  The eGFR has been calculated using the CKD EPI equation.     This calculation has not been validated in all clinical situations.     eGFR's persistently <90 mL/min signify possible Chronic Kidney     Disease.   Anion gap 15  5 - 15  CBC     Status: Abnormal   Collection Time    10/19/13  5:59 AM      Result Value Ref Range   WBC 5.5  4.0 - 10.5 K/uL   RBC 2.24 (*) 4.22 - 5.81 MIL/uL   Hemoglobin 7.9 (*) 13.0 - 17.0 g/dL   HCT 22.4 (*) 39.0 - 52.0 %   MCV 100.0  78.0 - 100.0 fL   MCH 35.3 (*) 26.0 - 34.0 pg   MCHC 35.3  30.0 - 36.0 g/dL   RDW 15.8 (*) 11.5 - 15.5 %   Platelets 97 (*) 150 - 400 K/uL   Comment: SPECIMEN CHECKED FOR CLOTS     CONSISTENT WITH PREVIOUS  RESULT  ANA     Status: None   Collection Time    10/19/13  3:18 PM      Result Value Ref Range   ANA NEGATIVE  NEGATIVE   Comment: Performed at [facility]     Status: Abnormal   Collection Time    10/19/13  3:18 PM      Result Value Ref Range   Complement C4, Body Fluid 48 (*) 10 - 40 mg/dL   Comment: Performed at [facility]     Status: None   Collection Time    10/19/13  3:18 PM      Result Value Ref Range   C3 Complement 104  90 - 180 mg/dL   Comment: Performed at [facility]  MPO/PR-3 (ANCA) ANTIBODIES     Status: None   Collection Time    10/19/13  3:18 PM      Result Value Ref Range   Myeloperoxidase Abs <1.0  <1.0 AI   Comment: (NOTE)                                  Value   Interpretation                                 <1.0 AI: No Antibody Detected                              >or=1.0 AI: Antibody Detected     Autoantibodies to myeloperoxidase (MPO) are commonly     associated with the following small-vessel     vasculitides: microscopic polyangiitis,     polyarteritis nodosa, Churg-Strauss syndrome,     necrotizing and crescentic glomerulonephritis and     occasionally Wegener's granulomatosis. The perinuclear     IFA pattern, (p-ANCA), is based largely on autoantibody     to myeloperoxidase which serves as the primary antigen     These autoantibodies are present in active disease     state.   Serine Protease 3 <1.0  <1.0 AI   Comment: (NOTE)  Value   Interpretation                                 <1.0 AI: No Antibody Detected                              >or=1.0 AI: Antibody Detected     Autoantibodies to proteinase-3 (PR-3) are accepted as     characteristic for granulomatosis with polyangiitis     (Wegener's), and are detectable in 95% of the     histologically proven cases. The cytoplasmic IFA     pattern, (c-ANCA), is based largely on autoantibody to     PR-3 which  serves as the primary antigen.     These autoantibodies are present in active disease     state.     Performed at [facility]     Status: None   Collection Time    10/19/13  3:18 PM      Result Value Ref Range   GBM Ab <1.0  <1.0 AI   Comment: (NOTE)                                  Value   Interpretation                                 <1.0 AI: No Antibody Detected                              >or=1.0 AI: Antibody Detected     Performed at [facility]  PHOSPHORUS     Status: None   Collection Time    10/20/13  5:50 AM      Result Value Ref Range   Phosphorus 4.4  2.3 - 4.6 mg/dL  FERRITIN     Status: None   Collection Time    10/20/13  5:50 AM      Result Value Ref Range   Ferritin 248  22 - 322 ng/mL   Comment: Performed at [facility]     Status: Abnormal   Collection Time    10/20/13  5:50 AM      Result Value Ref Range   Iron 53  42 - 135 ug/dL   TIBC 206 (*) 215 - 435 ug/dL   Saturation Ratios 26  20 - 55 %   UIBC 153  125 - 400 ug/dL   Comment: Performed at [place]     Status: Abnormal   Collection Time    10/20/13  5:50 AM      Result Value Ref Range   Sodium 141  137 - 147 mEq/L   Potassium 4.2  3.7 - 5.3 mEq/L   Chloride 104  96 - 112 mEq/L   CO2 24  19 - 32 mEq/L   Glucose, Bld 111 (*) 70 - 99 mg/dL   BUN 51 (*) 6 - 23 mg/dL   Creatinine, Ser 4.57 (*) 0.50 - 1.35 mg/dL   Calcium 8.8  8.4 - 10.5 mg/dL   GFR calc non Af Amer 10 (*) >90 mL/min   GFR calc  Af Amer 12 (*) >90 mL/min   Comment: (NOTE)     The eGFR has been calculated using the CKD EPI equation.     This calculation has not been validated in all clinical situations.     eGFR's persistently <90 mL/min signify possible Chronic Kidney     Disease.   Anion gap 13  5 - 15    Lipid Panel No results found for this basename: CHOL, TRIG, HDL, CHOLHDL, VLDL, LDLCALC,  in the last 72  hours  Studies/Results: EEG 1. Single epileptiform discharge involving the left temporal region. However, there is no evidence of ongoing electrographic seizures. This is most likely commensurate with his underlying history of seizures. 2. Mild generalized slowing indicating a mild generalized encephalopathy.  Medications:  Scheduled Meds: . albuterol  2.5 mg Nebulization QID  . allopurinol  300 mg Oral q morning - 10a  . aspirin  81 mg Oral Daily  . clopidogrel  75 mg Oral q morning - 10a  . donepezil  10 mg Oral QHS  . famotidine  20 mg Oral Daily  . furosemide  40 mg Intravenous BID  . isosorbide mononitrate  15 mg Oral Daily  . levofloxacin (LEVAQUIN) IV  500 mg Intravenous Q48H  . Linaclotide  145 mcg Oral Daily  . metoprolol tartrate  12.5 mg Oral BID  . Oxcarbazepine  300 mg Oral BID  . oxybutynin  5 mg Oral BID  . simvastatin  20 mg Oral q1800  . sodium chloride  3 mL Intravenous Q12H  . tamsulosin  0.4 mg Oral QHS  . tiotropium  18 mcg Inhalation Daily   Continuous Infusions: . sodium chloride 135 mL/hr at 10/20/13 1428   PRN Meds:.acetaminophen, acetaminophen, alum & mag hydroxide-simeth, docusate sodium, nitroGLYCERIN, ondansetron (ZOFRAN) IV, ondansetron     LOS: 2 days   Jaunice Mirza A. Merlene Strickland, M.D.  Diplomate, Tax adviser of Psychiatry and Neurology ( Neurology).

## 2013-10-20 NOTE — Progress Notes (Signed)
Subjective: Interval History: has complaints cough with some blood tinged sputum. Unable to sleep last night. Denies any nausea or vomting.  Objective: Vital signs in last 24 hours: Temp:  [97.5 F (36.4 C)-97.7 F (36.5 C)] 97.5 F (36.4 C) (10/06 0558) Pulse Rate:  [72-94] 85 (10/06 0558) Resp:  [18-20] 20 (10/06 0558) BP: (110-119)/(59-63) 110/59 mmHg (10/06 0558) SpO2:  [95 %-98 %] 95 % (10/06 0702) Weight change:   Intake/Output from previous day: 10/05 0701 - 10/06 0700 In: 803 [I.V.:803] Out: -  Intake/Output this shift:    General appearance: alert, cooperative and no distress Resp: clear to auscultation bilaterally Cardio: regular rate and rhythm, S1, S2 normal, no murmur, click, rub or gallop GI: soft, non-tender; bowel sounds normal; no masses,  no organomegaly Extremities: extremities normal, atraumatic, no cyanosis or edema  Lab Results:  Recent Labs  10/18/13 1404 10/19/13 0559  WBC 5.7 5.5  HGB 8.3* 7.9*  HCT 24.5* 22.4*  PLT 91* 97*   BMET:  Recent Labs  10/19/13 0559 10/20/13 0550  NA 143 141  K 4.4 4.2  CL 104 104  CO2 24 24  GLUCOSE 102* 111*  BUN 54* 51*  CREATININE 4.93* 4.57*  CALCIUM 8.7 8.8   No results found for this basename: PTH,  in the last 72 hours Iron Studies: No results found for this basename: IRON, TIBC, TRANSFERRIN, FERRITIN,  in the last 72 hours  Studies/Results: Dg Chest 2 View  10/18/2013   CLINICAL DATA:  Right-sided arm pain weakness and difficulty speaking today, in patient with history of numerous strokes  EXAM: CHEST  2 VIEW  COMPARISON:  09/17/2013  FINDINGS: Moderate cardiac enlargement. Cardiac pacer in unchanged position. Vascular pattern is normal.  Mild diffuse interstitial prominence is similar to prior study with no focal consolidation. Tiny bilateral pleural effusions suspected.  IMPRESSION: Cardiac enlargement with tiny pleural effusions. Mild interstitial change bilaterally, similar but slightly more  prominent from prior study, could reflect minimal interstitial edema.   Electronically Signed   By: Skipper Cliche M.D.   On: 10/18/2013 15:13   Ct Head Wo Contrast  10/18/2013   CLINICAL DATA:  Acute dysphagia this afternoon, history of 3 strokes previously with residual left facial droop  EXAM: CT HEAD WITHOUT CONTRAST  TECHNIQUE: Contiguous axial images were obtained from the base of the skull through the vertex without intravenous contrast.  COMPARISON:  03/13/2013 CT and MRI  FINDINGS: Numerous chronic bilateral lacunar infarcts in the basal ganglia region, stable. Severe atrophy stable. Periventricular white matter low attenuations stable.  No evidence of hemorrhage or extra-axial fluid. No acute vascular territory infarct. Calvarium is intact.  IMPRESSION: Severe atrophy with numerous chronic lacunar infarcts, findings stable from prior studies.   Electronically Signed   By: Skipper Cliche M.D.   On: 10/18/2013 15:25   US Carotid Bilateral  10/19/2013   CLINICAL DATA:  80 year old male with slurred speech, and difficulty using his right arm.  EXAM: BILATERAL CAROTID DUPLEX ULTRASOUND  TECHNIQUE: Pearline Cables scale imaging, color Doppler and duplex ultrasound were performed of bilateral carotid and vertebral arteries in the neck.  COMPARISON:  Head CT 10/18/2013 ; prior carotid ultrasound 07/17/2012  FINDINGS: Criteria: Quantification of carotid stenosis is based on velocity parameters that correlate the residual internal carotid diameter with NASCET-based stenosis levels, using the diameter of the distal internal carotid lumen as the denominator for stenosis measurement.  The following velocity measurements were obtained:  RIGHT  ICA:  114/25 cm/sec  CCA:  47/42 cm/sec  SYSTOLIC ICA/CCA RATIO:  1.6  DIASTOLIC ICA/CCA RATIO:  1.9  ECA:  91 cm/sec  LEFT  ICA:  139/38 cm/sec  CCA:  59/56 cm/sec  SYSTOLIC ICA/CCA RATIO:  1.8  DIASTOLIC ICA/CCA RATIO:  2.1  ECA:  104 cm/sec  RIGHT CAROTID ARTERY: Heterogeneous,  irregular and partially calcified plaque in the distal common carotid artery extending into the carotid bulb and proximal internal and external carotid arteries. By peak systolic velocity criteria, the estimated diameter stenosis remains less than 50%. A portion of the most proximal internal carotid artery is obscured by shadow artifact related to the calcified plaque. The spectral waveforms distal to this location do not suggest high-grade stenosis.  RIGHT VERTEBRAL ARTERY:  Patent with normal antegrade flow.  LEFT CAROTID ARTERY: Heterogeneous and irregular atherosclerotic plaque in the left common carotid artery extending into the proximal internal and external carotid arteries. By peak systolic velocity criteria, the estimated stenosis is 50-69%.  LEFT VERTEBRAL ARTERY:  Patent with normal antegrade flow.  IMPRESSION: 1. Heterogeneous and irregular atherosclerotic plaque results in an estimated 50-69% diameter left internal carotid artery stenosis. This represents interval progression from less than 50% stenosis seen on 07/17/2012. 2. Similar less than 50% diameter stenosis of the proximal right internal carotid artery. 3. Vertebral arteries remain patent with normal antegrade flow. Signed,  Criselda Peaches, MD  [facility]  [facility]   Electronically Signed   By: Jacqulynn Cadet M.D.   On: 10/19/2013 12:15    I have reviewed the patient's current medications.  Assessment/Plan: Problem #1 acute kidney injury: To be secondary to prerenal versus ATN. Presently his BUN and creatinine is improving. Patient is asymptomatic.  Problem #2 hypertension: His blood pressure is reasonably controlled Problem #3 history of cough with some hemoptysis. Patient states that getting much better. Etiology as this moment is not clear. Patient with previous history of pneumonia and was treated with antibiotics. Presently it to rule out pulmonary renal syndrome. Complement  is normal other studies are pending. Problem #4 coronary artery disease Problem #5 anemia: His hemoglobin hematocrit is low and declining. Etiology as this moment not clear. Patient also with thrombocytopenia Problem #6 TIA: Presently doesn't have any weakness and getting better. Problem #7 metabolic bone disease: His calcium and phosphorus isn't range. Problem #8 chronic renal failure: Stage IV. Etiology was thought to be secondary to diabetes/hypertension/ischemic. Plan: We'll continue his hydration We'll check iron studies and basic metabolic panel in the morning.  LOS: 2 days   Jenay Morici S 10/20/2013,8:21 AM

## 2013-10-21 LAB — COMPLEMENT, TOTAL: Compl, Total (CH50): 57 U/mL (ref 31–60)

## 2013-10-21 LAB — CBC
HCT: 21.7 % — ABNORMAL LOW (ref 39.0–52.0)
Hemoglobin: 7.5 g/dL — ABNORMAL LOW (ref 13.0–17.0)
MCH: 34.7 pg — AB (ref 26.0–34.0)
MCHC: 34.6 g/dL (ref 30.0–36.0)
MCV: 100.5 fL — AB (ref 78.0–100.0)
Platelets: 99 10*3/uL — ABNORMAL LOW (ref 150–400)
RBC: 2.16 MIL/uL — AB (ref 4.22–5.81)
RDW: 15.8 % — AB (ref 11.5–15.5)
WBC: 7.4 10*3/uL (ref 4.0–10.5)

## 2013-10-21 LAB — BASIC METABOLIC PANEL
Anion gap: 17 — ABNORMAL HIGH (ref 5–15)
BUN: 48 mg/dL — ABNORMAL HIGH (ref 6–23)
CALCIUM: 8.7 mg/dL (ref 8.4–10.5)
CO2: 20 mEq/L (ref 19–32)
Chloride: 105 mEq/L (ref 96–112)
Creatinine, Ser: 4.29 mg/dL — ABNORMAL HIGH (ref 0.50–1.35)
GFR calc Af Amer: 13 mL/min — ABNORMAL LOW (ref >90)
GFR, EST NON AFRICAN AMERICAN: 11 mL/min — AB (ref >90)
GLUCOSE: 130 mg/dL — AB (ref 70–99)
Potassium: 4.3 mEq/L (ref 3.7–5.3)
Sodium: 142 mEq/L (ref 137–147)

## 2013-10-21 MED ORDER — ALBUTEROL SULFATE (2.5 MG/3ML) 0.083% IN NEBU
2.5000 mg | INHALATION_SOLUTION | RESPIRATORY_TRACT | Status: DC | PRN
  Administered 2013-10-21 – 2013-10-24 (×3): 2.5 mg via RESPIRATORY_TRACT
  Filled 2013-10-21: qty 3

## 2013-10-21 MED ORDER — METHYLPREDNISOLONE SODIUM SUCC 40 MG IJ SOLR
40.0000 mg | Freq: Four times a day (QID) | INTRAMUSCULAR | Status: DC
  Administered 2013-10-21 – 2013-10-25 (×16): 40 mg via INTRAVENOUS
  Filled 2013-10-21 (×16): qty 1

## 2013-10-21 MED ORDER — ALBUTEROL SULFATE (2.5 MG/3ML) 0.083% IN NEBU
INHALATION_SOLUTION | RESPIRATORY_TRACT | Status: AC
  Administered 2013-10-21: 2.5 mg via RESPIRATORY_TRACT
  Filled 2013-10-21: qty 3

## 2013-10-21 NOTE — Progress Notes (Signed)
Subjective: He says he doesn't feel well. He has no new complaints. He is very weak. He still coughing up blood. He is short of breath.  Objective: Vital signs in last 24 hours: Temp:  [97.1 F (36.2 C)-97.5 F (36.4 C)] 97.2 F (36.2 C) (10/07 0531) Pulse Rate:  [42-82] 82 (10/07 0531) Resp:  [20] 20 (10/07 0531) BP: (106-114)/(41-51) 114/42 mmHg (10/07 0531) SpO2:  [88 %-96 %] 88 % (10/07 0700) Weight change:  Last BM Date: 10/16/13  Intake/Output from previous day: 10/06 0701 - 10/07 0700 In: 5334.5 [I.V.:5334.5] Out: -   PHYSICAL EXAM General appearance: alert and moderate distress Resp: Rhonchi bilaterally Cardio: regular rate and rhythm, S1, S2 normal, no murmur, click, rub or gallop GI: soft, non-tender; bowel sounds normal; no masses,  no organomegaly Extremities: extremities normal, atraumatic, no cyanosis or edema  Lab Results:  Results for orders placed during the hospital encounter of 10/18/13 (from the past 48 hour(s))  ANA     Status: None   Collection Time    10/19/13  3:18 PM      Result Value Ref Range   ANA NEGATIVE  NEGATIVE   Comment: Performed at [facility]     Status: Abnormal   Collection Time    10/19/13  3:18 PM      Result Value Ref Range   Complement C4, Body Fluid 48 (*) 10 - 40 mg/dL   Comment: Performed at [facility]     Status: None   Collection Time    10/19/13  3:18 PM      Result Value Ref Range   C3 Complement 104  90 - 180 mg/dL   Comment: Performed at [facility]  MPO/PR-3 (ANCA) ANTIBODIES     Status: None   Collection Time    10/19/13  3:18 PM      Result Value Ref Range   Myeloperoxidase Abs <1.0  <1.0 AI   Comment: (NOTE)                                  Value   Interpretation                                 <1.0 AI: No Antibody Detected                              >or=1.0 AI: Antibody Detected     Autoantibodies to myeloperoxidase (MPO) are commonly      associated with the following small-vessel     vasculitides: microscopic polyangiitis,     polyarteritis nodosa, Churg-Strauss syndrome,     necrotizing and crescentic glomerulonephritis and     occasionally Wegener's granulomatosis. The perinuclear     IFA pattern, (p-ANCA), is based largely on autoantibody     to myeloperoxidase which serves as the primary antigen     These autoantibodies are present in active disease     state.   Serine Protease 3 <1.0  <1.0 AI   Comment: (NOTE)                                  Value   Interpretation                                 <  1.0 AI: No Antibody Detected                              >or=1.0 AI: Antibody Detected     Autoantibodies to proteinase-3 (PR-3) are accepted as     characteristic for granulomatosis with polyangiitis     (Wegener's), and are detectable in 95% of the     histologically proven cases. The cytoplasmic IFA     pattern, (c-ANCA), is based largely on autoantibody to     PR-3 which serves as the primary antigen.     These autoantibodies are present in active disease     state.     Performed at [facility]  GLOMERULAR BASEMENT MEMBRANE ANTIBODIES     Status: None   Collection Time    10/19/13  3:18 PM      Result Value Ref Range   GBM Ab <1.0  <1.0 AI   Comment: (NOTE)                                  Value   Interpretation                                 <1.0 AI: No Antibody Detected                              >or=1.0 AI: Antibody Detected     Performed at [facility]  PHOSPHORUS     Status: None   Collection Time    10/20/13  5:50 AM      Result Value Ref Range   Phosphorus 4.4  2.3 - 4.6 mg/dL  FERRITIN     Status: None   Collection Time    10/20/13  5:50 AM      Result Value Ref Range   Ferritin 248  22 - 322 ng/mL   Comment: Performed at [facility]  IRON AND TIBC     Status: Abnormal   Collection Time    10/20/13  5:50 AM      Result Value Ref Range   Iron 53  42 - 135 ug/dL    TIBC 206 (*) 215 - 435 ug/dL   Saturation Ratios 26  20 - 55 %   UIBC 153  125 - 400 ug/dL   Comment: Performed at [facility]  BASIC METABOLIC PANEL     Status: Abnormal   Collection Time    10/20/13  5:50 AM      Result Value Ref Range   Sodium 141  137 - 147 mEq/L   Potassium 4.2  3.7 - 5.3 mEq/L   Chloride 104  96 - 112 mEq/L   CO2 24  19 - 32 mEq/L   Glucose, Bld 111 (*) 70 - 99 mg/dL   BUN 51 (*) 6 - 23 mg/dL   Creatinine, Ser 4.57 (*) 0.50 - 1.35 mg/dL   Calcium 8.8  8.4 - 10.5 mg/dL   GFR calc non Af Amer 10 (*) >90 mL/min   GFR calc Af Amer 12 (*) >90 mL/min   Comment: (NOTE)     The eGFR has been calculated using the CKD EPI equation.     This calculation has not been validated in all   clinical situations.     eGFR's persistently <90 mL/min signify possible Chronic Kidney     Disease.   Anion gap 13  5 - 15  BASIC METABOLIC PANEL     Status: Abnormal   Collection Time    10/21/13  6:10 AM      Result Value Ref Range   Sodium 142  137 - 147 mEq/L   Potassium 4.3  3.7 - 5.3 mEq/L   Chloride 105  96 - 112 mEq/L   CO2 20  19 - 32 mEq/L   Glucose, Bld 130 (*) 70 - 99 mg/dL   BUN 48 (*) 6 - 23 mg/dL   Creatinine, Ser 4.29 (*) 0.50 - 1.35 mg/dL   Calcium 8.7  8.4 - 10.5 mg/dL   GFR calc non Af Amer 11 (*) >90 mL/min   GFR calc Af Amer 13 (*) >90 mL/min   Comment: (NOTE)     The eGFR has been calculated using the CKD EPI equation.     This calculation has not been validated in all clinical situations.     eGFR's persistently <90 mL/min signify possible Chronic Kidney     Disease.   Anion gap 17 (*) 5 - 15  CBC     Status: Abnormal   Collection Time    10/21/13  6:10 AM      Result Value Ref Range   WBC 7.4  4.0 - 10.5 K/uL   RBC 2.16 (*) 4.22 - 5.81 MIL/uL   Hemoglobin 7.5 (*) 13.0 - 17.0 g/dL   HCT 21.7 (*) 39.0 - 52.0 %   MCV 100.5 (*) 78.0 - 100.0 fL   MCH 34.7 (*) 26.0 - 34.0 pg   MCHC 34.6  30.0 - 36.0 g/dL   RDW 15.8 (*) 11.5 - 15.5 %    Platelets 99 (*) 150 - 400 K/uL   Comment: SPECIMEN CHECKED FOR CLOTS     PLATELET COUNT CONFIRMED BY SMEAR    ABGS No results found for this basename: PHART, PCO2, PO2ART, TCO2, HCO3,  in the last 72 hours CULTURES No results found for this or any previous visit (from the past 240 hour(s)). Studies/Results: Ct Chest Wo Contrast  10/20/2013   CLINICAL DATA:  Hemoptysis for 1 month, shortness of breath, history emphysema, stroke, hypertension, coronary artery disease, asthma, former smoker  EXAM: CT CHEST WITHOUT CONTRAST  TECHNIQUE: Multidetector CT imaging of the chest was performed following the standard protocol without IV contrast. Sagittal and coronal MPR images reconstructed from axial data set.  COMPARISON:  09/23/2013  FINDINGS: Extensive atherosclerotic calcifications aorta and coronary arteries.  Pacemaker leads in RIGHT atrium and RIGHT ventricle.  Moderate-sized low-attenuation pericardial effusion, little changed.  Scattered normal size mediastinal lymph nodes, hila poorly assessed for presence of nodes due to lack of IV contrast.  Slight expansion of the abdominal aorta on the last image, 3.6 x 3.3 cm, patient with known abdominal aortic aneurysm note of current size is uncertain.  Small BILATERAL pleural effusions larger on RIGHT.  Calcified granulomata RIGHT lung and spleen.  Emphysematous and bronchitic changes consistent with COPD.  Patchy airspace infiltrates versus alveolar hemorrhage in upper lobes RIGHT greater than LEFT.  Compressive atelectasis of the lower lobes.  No discrete pulmonary mass/nodule or evidence of pneumothorax.  No acute osseous findings.  IMPRESSION: BILATERAL pulmonary infiltrates greater in upper lobes especially on RIGHT, could represent pneumonia or alveolar hemorrhage.  BILATERAL pleural effusions RIGHT greater than LEFT with adjacent compressive atelectasis.    Emphysematous, bronchitic and old granulomatous disease changes.  Moderate pericardial effusion,  little changed.  Extensive atherosclerotic disease with aneurysmal dilatation of the abdominal aorta on the last image in this patient with a history of known abdominal aortic aneurysm, though current size of aortic aneurysm is unknown.   Electronically Signed   By: Mark  Boles M.D.   On: 10/20/2013 08:58   Us Carotid Bilateral  10/19/2013   CLINICAL DATA:  80-year-old male with slurred speech, and difficulty using his right arm.  EXAM: BILATERAL CAROTID DUPLEX ULTRASOUND  TECHNIQUE: Gray scale imaging, color Doppler and duplex ultrasound were performed of bilateral carotid and vertebral arteries in the neck.  COMPARISON:  Head CT 10/18/2013 ; prior carotid ultrasound 07/17/2012  FINDINGS: Criteria: Quantification of carotid stenosis is based on velocity parameters that correlate the residual internal carotid diameter with NASCET-based stenosis levels, using the diameter of the distal internal carotid lumen as the denominator for stenosis measurement.  The following velocity measurements were obtained:  RIGHT  ICA:  114/25 cm/sec  CCA:  72/14 cm/sec  SYSTOLIC ICA/CCA RATIO:  1.6  DIASTOLIC ICA/CCA RATIO:  1.9  ECA:  91 cm/sec  LEFT  ICA:  139/38 cm/sec  CCA:  97/15 cm/sec  SYSTOLIC ICA/CCA RATIO:  1.8  DIASTOLIC ICA/CCA RATIO:  2.1  ECA:  104 cm/sec  RIGHT CAROTID ARTERY: Heterogeneous, irregular and partially calcified plaque in the distal common carotid artery extending into the carotid bulb and proximal internal and external carotid arteries. By peak systolic velocity criteria, the estimated diameter stenosis remains less than 50%. A portion of the most proximal internal carotid artery is obscured by shadow artifact related to the calcified plaque. The spectral waveforms distal to this location do not suggest high-grade stenosis.  RIGHT VERTEBRAL ARTERY:  Patent with normal antegrade flow.  LEFT CAROTID ARTERY: Heterogeneous and irregular atherosclerotic plaque in the left common carotid artery extending into  the proximal internal and external carotid arteries. By peak systolic velocity criteria, the estimated stenosis is 50-69%.  LEFT VERTEBRAL ARTERY:  Patent with normal antegrade flow.  IMPRESSION: 1. Heterogeneous and irregular atherosclerotic plaque results in an estimated 50-69% diameter left internal carotid artery stenosis. This represents interval progression from less than 50% stenosis seen on 07/17/2012. 2. Similar less than 50% diameter stenosis of the proximal right internal carotid artery. 3. Vertebral arteries remain patent with normal antegrade flow. Signed,  Heath K. McCullough, MD  [facility]  [facility]   Electronically Signed   By: Heath  McCullough M.D.   On: 10/19/2013 12:15    Medications:  Prior to Admission:  Prescriptions prior to admission  Medication Sig Dispense Refill  . allopurinol (ZYLOPRIM) 300 MG tablet Take 300 mg by mouth every morning.       . clopidogrel (PLAVIX) 75 MG tablet Take 75 mg by mouth every morning.       . donepezil (ARICEPT) 10 MG tablet Take 10 mg by mouth at bedtime.       . famotidine (PEPCID) 20 MG tablet TAKE ONE TABLET BY MOUTH TWICE DAILY.  60 tablet  5  . furosemide (LASIX) 40 MG tablet Take 20 mg by mouth daily.       . isosorbide mononitrate (IMDUR) 30 MG 24 hr tablet Take 0.5 tablets (15 mg total) by mouth daily.  30 tablet  11  . Linaclotide (LINZESS) 145 MCG CAPS capsule Take 1 capsule (145 mcg total) by mouth daily.  30 capsule  5  .   metoprolol tartrate (LOPRESSOR) 25 MG tablet Take 0.5 tablets (12.5 mg total) by mouth 2 (two) times daily.  60 tablet  3  . nitroGLYCERIN (NITROSTAT) 0.4 MG SL tablet Place 1 tablet (0.4 mg total) under the tongue every 5 (five) minutes as needed for chest pain. 1 tablet under tongue at onset of chest pain;you may repeat every 5 minutes for up to 3 doses.  25 tablet  3  . Oxcarbazepine (TRILEPTAL) 300 MG tablet Take 300 mg by mouth 2 (two) times daily.       Marland Kitchen  oxybutynin (DITROPAN) 5 MG tablet Take 5 mg by mouth 2 (two) times daily.       . pravastatin (PRAVACHOL) 40 MG tablet Take 40 mg by mouth daily. Daily at bedtime      . Tamsulosin HCl (FLOMAX) 0.4 MG CAPS Take 0.4 mg by mouth at bedtime.       Marland Kitchen tiotropium (SPIRIVA HANDIHALER) 18 MCG inhalation capsule Place 1 capsule (18 mcg total) into inhaler and inhale daily.  30 capsule  6  . VITAMIN B1-B12 IM Inject into the muscle every 30 (thirty) days. Once a month       Scheduled: . albuterol  2.5 mg Nebulization QID  . allopurinol  300 mg Oral q morning - 10a  . aspirin  81 mg Oral Daily  . clopidogrel  75 mg Oral q morning - 10a  . donepezil  10 mg Oral QHS  . famotidine  20 mg Oral Daily  . furosemide  40 mg Intravenous BID  . isosorbide mononitrate  15 mg Oral Daily  . levofloxacin (LEVAQUIN) IV  500 mg Intravenous Q48H  . Linaclotide  145 mcg Oral Daily  . methylPREDNISolone (SOLU-MEDROL) injection  40 mg Intravenous Q6H  . metoprolol tartrate  12.5 mg Oral BID  . Oxcarbazepine  300 mg Oral BID  . oxybutynin  5 mg Oral BID  . simvastatin  20 mg Oral q1800  . sodium chloride  3 mL Intravenous Q12H  . tamsulosin  0.4 mg Oral QHS  . tiotropium  18 mcg Inhalation Daily   Continuous: . sodium chloride 135 mL/hr at 10/20/13 2156   WIO:[id number], acetaminophen, albuterol, alum & mag hydroxide-simeth, docusate sodium, nitroGLYCERIN, ondansetron (ZOFRAN) IV, ondansetron  Assesment: He was admitted with an acute lacunar infarction. He is on Plavix and aspirin for that. I think he has pulmonary hemorrhage based on his CT and of course that is made worse by the Plavix and aspirin. He has not had any sort of massive hemoptysis. He has COPD at baseline. He complains of being weak. His renal function is better but is very slow to respond. Principal Problem:   Acute lacunar infarction Active Problems:   Acute kidney injury   Anemia   Hemoptysis    Plan: I discussed his situation with his  son and daughter-in-law at bedtime. His daughter-in-law is an Therapist, sports. I showed her imaging studies. This looks like he has some sort of pulmonary hemorrhage versus pneumonia. He is being treated with Levaquin and I'm going to add steroids. No other new treatments. I am concerned that he may not survive this episode and I explained that to his family    LOS: 3 days   [name] L 10/21/2013, 8:59 AM

## 2013-10-21 NOTE — Progress Notes (Addendum)
[name]  MRN: [id number]  DOB/AGE: 1921/11/03 80 y.o.  Primary Care Physician:FAGAN,ROY, MD  Admit date: 10/18/2013  Chief Complaint:  Chief Complaint  Patient presents with  . Aphasia    S-Pt presented on  10/18/2013 with  Chief Complaint  Patient presents with  . Aphasia  .    Pt today feels better  Meds . albuterol  2.5 mg Nebulization QID  . allopurinol  300 mg Oral q morning - 10a  . aspirin  81 mg Oral Daily  . clopidogrel  75 mg Oral q morning - 10a  . donepezil  10 mg Oral QHS  . famotidine  20 mg Oral Daily  . furosemide  40 mg Intravenous BID  . isosorbide mononitrate  15 mg Oral Daily  . levofloxacin (LEVAQUIN) IV  500 mg Intravenous Q48H  . Linaclotide  145 mcg Oral Daily  . methylPREDNISolone (SOLU-MEDROL) injection  40 mg Intravenous Q6H  . metoprolol tartrate  12.5 mg Oral BID  . Oxcarbazepine  300 mg Oral BID  . oxybutynin  5 mg Oral BID  . simvastatin  20 mg Oral q1800  . sodium chloride  3 mL Intravenous Q12H  . tamsulosin  0.4 mg Oral QHS  . tiotropium  18 mcg Inhalation Daily     Physical Exam: Vital signs in last 24 hours: Temp:  [97.1 F (36.2 C)-97.5 F (36.4 C)] 97.2 F (36.2 C) (10/07 0531) Pulse Rate:  [42-82] 82 (10/07 0531) Resp:  [20] 20 (10/07 0531) BP: (106-114)/(41-51) 114/42 mmHg (10/07 0531) SpO2:  [88 %-96 %] 88 % (10/07 0700) Weight change:  Last BM Date: 10/16/13  Intake/Output from previous day: 10/06 0701 - 10/07 0700 In: 5334.5 [I.V.:5334.5] Out: -      Physical Exam: General- pt is awake,alert, oriented to time place and person Resp- No acute REsp distress, CTA B/L NO Rhonchi CVS- S1S2 regular in rate and rhythm GIT- BS+, soft, NT, ND EXT- NO LE Edema, Cyanosis   Lab Results: CBC  Recent Labs  10/19/13 0559 10/21/13 0610  WBC 5.5 7.4  HGB 7.9* 7.5*  HCT 22.4* 21.7*  PLT 97* 99*    BMET  Recent Labs  10/20/13 0550 10/21/13 0610  NA 141 142  K 4.2 4.3  CL 104 105  CO2 24 20   GLUCOSE 111* 130*  BUN 51* 48*  CREATININE 4.57* 4.29*  CALCIUM 8.8 8.7   Trend Creat 2015  5.29=> 4.57=>4.29           2.1--2.8 ( Baseline) 2014  1.4--1.7 2013  1.3--1.4 2012   1.3 2011   1.6--1.7 2010    1.6--1.9 2009   1,6    Lab Results  Component Value Date   CALCIUM 8.7 10/21/2013   PHOS 4.4 10/20/2013          Impression: 1)Renal  AKI secondary to Prerenal/ATN/Interstitial                 AKI improving slowly               CKD stage 4 .               CKD since 2009               CKD secondary to HTN/Age associated decline                Progression of CKD now marked with AKI  Proteinura Absent .                Autoimmune work up Negative                 ANA/ANCA -negative                  Complements not low  2)HTN Medication- On Diuretics- On Beta blockers    3)Anemia HGb not  at goal (9--11) 11.1 ( April 2015)=> 7.5 ( today) MCV high Pul hemorrhage??  4)CKD Mineral-Bone Disorder Phosphorus at goal. Calcium at goal.  5)CNS-Admitted with TIA Neurology and Primary MD following  6)Electrolytes Normokalemic NOrmonatremic   7)Acid base Co2 at goal     Plan:  Will d/w primary team regarding work up for anemia Will continue current care Will reduce IVF    Scammon S 10/21/2013, 9:16 AM

## 2013-10-22 LAB — CBC WITH DIFFERENTIAL/PLATELET
BASOS ABS: 0 10*3/uL (ref 0.0–0.1)
Basophils Relative: 0 % (ref 0–1)
EOS ABS: 0 10*3/uL (ref 0.0–0.7)
EOS PCT: 0 % (ref 0–5)
HEMATOCRIT: 22 % — AB (ref 39.0–52.0)
HEMOGLOBIN: 7.6 g/dL — AB (ref 13.0–17.0)
Lymphocytes Relative: 5 % — ABNORMAL LOW (ref 12–46)
Lymphs Abs: 0.4 10*3/uL — ABNORMAL LOW (ref 0.7–4.0)
MCH: 34.2 pg — AB (ref 26.0–34.0)
MCHC: 34.5 g/dL (ref 30.0–36.0)
MCV: 99.1 fL (ref 78.0–100.0)
MONO ABS: 0.3 10*3/uL (ref 0.1–1.0)
MONOS PCT: 4 % (ref 3–12)
Neutro Abs: 6.2 10*3/uL (ref 1.7–7.7)
Neutrophils Relative %: 91 % — ABNORMAL HIGH (ref 43–77)
Platelets: 94 10*3/uL — ABNORMAL LOW (ref 150–400)
RBC: 2.22 MIL/uL — ABNORMAL LOW (ref 4.22–5.81)
RDW: 15.9 % — ABNORMAL HIGH (ref 11.5–15.5)
WBC: 6.9 10*3/uL (ref 4.0–10.5)

## 2013-10-22 LAB — BASIC METABOLIC PANEL
Anion gap: 18 — ABNORMAL HIGH (ref 5–15)
BUN: 55 mg/dL — AB (ref 6–23)
CHLORIDE: 102 meq/L (ref 96–112)
CO2: 18 meq/L — AB (ref 19–32)
CREATININE: 4.05 mg/dL — AB (ref 0.50–1.35)
Calcium: 9.2 mg/dL (ref 8.4–10.5)
GFR calc Af Amer: 14 mL/min — ABNORMAL LOW (ref >90)
GFR calc non Af Amer: 12 mL/min — ABNORMAL LOW (ref >90)
Glucose, Bld: 192 mg/dL — ABNORMAL HIGH (ref 70–99)
Potassium: 4.5 mEq/L (ref 3.7–5.3)
Sodium: 138 mEq/L (ref 137–147)

## 2013-10-22 MED ORDER — DARBEPOETIN ALFA-POLYSORBATE 60 MCG/0.3ML IJ SOLN
60.0000 ug | INTRAMUSCULAR | Status: DC
  Administered 2013-10-22: 60 ug via SUBCUTANEOUS
  Filled 2013-10-22 (×2): qty 0.3

## 2013-10-22 MED ORDER — FUROSEMIDE 10 MG/ML IJ SOLN
100.0000 mg | Freq: Two times a day (BID) | INTRAVENOUS | Status: DC
  Administered 2013-10-22 – 2013-10-23 (×2): 100 mg via INTRAVENOUS
  Filled 2013-10-22 (×2): qty 10

## 2013-10-22 NOTE — Progress Notes (Signed)
Subjective: He says he feels a little bit better. He still coughing up blood. He has some third spacing of fluid.  Objective: Vital signs in last 24 hours: Temp:  [97.5 F (36.4 C)-97.8 F (36.6 C)] 97.8 F (36.6 C) (10/08 0553) Pulse Rate:  [71-83] 83 (10/08 0553) Resp:  [16-24] 16 (10/08 0553) BP: (116-131)/(59-63) 121/63 mmHg (10/08 0553) SpO2:  [90 %-95 %] 91 % (10/08 0723) Weight change:  Last BM Date: 10/19/13  Intake/Output from previous day: 10/07 0701 - 10/08 0700 In: 4268 [P.O.:520; I.V.:900] Out: 300 [Urine:300]  PHYSICAL EXAM General appearance: alert, cooperative and moderate distress Resp: rhonchi bilaterally Cardio: regular rate and rhythm, S1, S2 normal, no murmur, click, rub or gallop GI: soft, non-tender; bowel sounds normal; no masses,  no organomegaly Extremities: extremities normal, atraumatic, no cyanosis or edema  Lab Results:  Results for orders placed during the hospital encounter of 10/18/13 (from the past 48 hour(s))  BASIC METABOLIC PANEL     Status: Abnormal   Collection Time    10/21/13  6:10 AM      Result Value Ref Range   Sodium 142  137 - 147 mEq/L   Potassium 4.3  3.7 - 5.3 mEq/L   Chloride 105  96 - 112 mEq/L   CO2 20  19 - 32 mEq/L   Glucose, Bld 130 (*) 70 - 99 mg/dL   BUN 48 (*) 6 - 23 mg/dL   Creatinine, Ser 4.29 (*) 0.50 - 1.35 mg/dL   Calcium 8.7  8.4 - 10.5 mg/dL   GFR calc non Af Amer 11 (*) >90 mL/min   GFR calc Af Amer 13 (*) >90 mL/min   Comment: (NOTE)     The eGFR has been calculated using the CKD EPI equation.     This calculation has not been validated in all clinical situations.     eGFR's persistently <90 mL/min signify possible Chronic Kidney     Disease.   Anion gap 17 (*) 5 - 15  CBC     Status: Abnormal   Collection Time    10/21/13  6:10 AM      Result Value Ref Range   WBC 7.4  4.0 - 10.5 K/uL   RBC 2.16 (*) 4.22 - 5.81 MIL/uL   Hemoglobin 7.5 (*) 13.0 - 17.0 g/dL   HCT 21.7 (*) 39.0 - 52.0 %   MCV  100.5 (*) 78.0 - 100.0 fL   MCH 34.7 (*) 26.0 - 34.0 pg   MCHC 34.6  30.0 - 36.0 g/dL   RDW 15.8 (*) 11.5 - 15.5 %   Platelets 99 (*) 150 - 400 K/uL   Comment: SPECIMEN CHECKED FOR CLOTS     PLATELET COUNT CONFIRMED BY SMEAR    ABGS No results found for this basename: PHART, PCO2, PO2ART, TCO2, HCO3,  in the last 72 hours CULTURES No results found for this or any previous visit (from the past 240 hour(s)). Studies/Results: No results found.  Medications:  Prior to Admission:  Prescriptions prior to admission  Medication Sig Dispense Refill  . allopurinol (ZYLOPRIM) 300 MG tablet Take 300 mg by mouth every morning.       . clopidogrel (PLAVIX) 75 MG tablet Take 75 mg by mouth every morning.       . donepezil (ARICEPT) 10 MG tablet Take 10 mg by mouth at bedtime.       . famotidine (PEPCID) 20 MG tablet TAKE ONE TABLET BY MOUTH TWICE DAILY.  60 tablet  5  . furosemide (LASIX) 40 MG tablet Take 20 mg by mouth daily.       . isosorbide mononitrate (IMDUR) 30 MG 24 hr tablet Take 0.5 tablets (15 mg total) by mouth daily.  30 tablet  11  . Linaclotide (LINZESS) 145 MCG CAPS capsule Take 1 capsule (145 mcg total) by mouth daily.  30 capsule  5  . metoprolol tartrate (LOPRESSOR) 25 MG tablet Take 0.5 tablets (12.5 mg total) by mouth 2 (two) times daily.  60 tablet  3  . nitroGLYCERIN (NITROSTAT) 0.4 MG SL tablet Place 1 tablet (0.4 mg total) under the tongue every 5 (five) minutes as needed for chest pain. 1 tablet under tongue at onset of chest pain;you may repeat every 5 minutes for up to 3 doses.  25 tablet  3  . Oxcarbazepine (TRILEPTAL) 300 MG tablet Take 300 mg by mouth 2 (two) times daily.       Marland Kitchen oxybutynin (DITROPAN) 5 MG tablet Take 5 mg by mouth 2 (two) times daily.       . pravastatin (PRAVACHOL) 40 MG tablet Take 40 mg by mouth daily. Daily at bedtime      . Tamsulosin HCl (FLOMAX) 0.4 MG CAPS Take 0.4 mg by mouth at bedtime.       Marland Kitchen tiotropium (SPIRIVA HANDIHALER) 18 MCG  inhalation capsule Place 1 capsule (18 mcg total) into inhaler and inhale daily.  30 capsule  6  . VITAMIN B1-B12 IM Inject into the muscle every 30 (thirty) days. Once a month       Scheduled: . albuterol  2.5 mg Nebulization QID  . allopurinol  300 mg Oral q morning - 10a  . aspirin  81 mg Oral Daily  . clopidogrel  75 mg Oral q morning - 10a  . donepezil  10 mg Oral QHS  . famotidine  20 mg Oral Daily  . furosemide  40 mg Intravenous BID  . isosorbide mononitrate  15 mg Oral Daily  . levofloxacin (LEVAQUIN) IV  500 mg Intravenous Q48H  . Linaclotide  145 mcg Oral Daily  . methylPREDNISolone (SOLU-MEDROL) injection  40 mg Intravenous Q6H  . metoprolol tartrate  12.5 mg Oral BID  . Oxcarbazepine  300 mg Oral BID  . oxybutynin  5 mg Oral BID  . simvastatin  20 mg Oral q1800  . sodium chloride  3 mL Intravenous Q12H  . tamsulosin  0.4 mg Oral QHS  . tiotropium  18 mcg Inhalation Daily   Continuous: . sodium chloride 75 mL/hr at 10/22/13 3570   VXB:[id number], acetaminophen, albuterol, alum & mag hydroxide-simeth, docusate sodium, nitroGLYCERIN, ondansetron (ZOFRAN) IV, ondansetron  Assesment: He was admitted with an acute stroke. He has acute kidney injury which is slowly improving. He has been having hemoptysis for about 2 months I think he probably having some sort of intrapulmonary hemorrhage. I discussed all this with his son and daughter-in-law yesterday and with her daughter today. We discussed the possibility of bronchoscopy and other treatment but we all feel that these probably not strong enough to do that effectively now. He may require blood. Principal Problem:   Acute lacunar infarction Active Problems:   Acute kidney injury   Anemia   Hemoptysis    Plan: Continue current treatments he does seem to be slowly improving    LOS: 4 days   Bertrum Helmstetter L 10/22/2013, 8:54 AM

## 2013-10-22 NOTE — Progress Notes (Signed)
Subjective: Interval History: Patient feels better. Presently with less cough and hemoptysis. Denies any nausea or vomitng  Objective: Vital signs in last 24 hours: Temp:  [97.5 F (36.4 C)-97.8 F (36.6 C)] 97.8 F (36.6 C) (10/08 0553) Pulse Rate:  [71-83] 83 (10/08 0553) Resp:  [16-24] 16 (10/08 0553) BP: (116-131)/(59-63) 121/63 mmHg (10/08 0553) SpO2:  [90 %-95 %] 91 % (10/08 0723) Weight change:   Intake/Output from previous day: 10/07 0701 - 10/08 0700 In: 6295 [P.O.:520; I.V.:900] Out: 300 [Urine:300] Intake/Output this shift:    Generally he is alert and in no apparent distress Chest decrease breath sound and clear heart RRR no murmur Abdomen none tender and positive bowl sound Extremities trace to 1+edema  Lab Results:  Recent Labs  10/21/13 0610 10/22/13 0902  WBC 7.4 6.9  HGB 7.5* 7.6*  HCT 21.7* 22.0*  PLT 99* 94*   BMET:   Recent Labs  10/21/13 0610 10/22/13 0902  NA 142 138  K 4.3 4.5  CL 105 102  CO2 20 18*  GLUCOSE 130* 192*  BUN 48* 55*  CREATININE 4.29* 4.05*  CALCIUM 8.7 9.2   No results found for this basename: PTH,  in the last 72 hours Iron Studies:   Recent Labs  10/20/13 0550  IRON 53  TIBC 206*  FERRITIN 248    Studies/Results: No results found.  I have reviewed the patient's current medications.  Assessment/Plan: Problem #1 acute kidney injury: To be secondary to prerenal versus ATN. Presently his BUN and creatinine is improving.   Problem #2 hypertension: His blood pressure is reasonably controlled Problem #3 history of cough with some hemoptysis. Improving Problem #4 coronary artery disease Problem #5 anemia: His hemoglobin hematocrit is low and declining. His iron saturation and ferritin is normal hence anemia of chronic disease including chronic renal failure Problem #6 TIA: Presently doesn't have any weakness and getting better. Problem #7 metabolic bone disease: His calcium and phosphorus is range. Problem  #8 chronic renal failure: Stage IV. Etiology was thought to be secondary to diabetes/hypertension/ischemic. Problem#9Low CO2 possibly metabolic Plan: We'll continue his hydration Increase his lasix to 100 mg iv bid ABG Agree with blood transfusion Start on ARANESP 60 miq sq once a week We'll check his basic metabolic panel in the morning.  LOS: 4 days   Bekim Werntz S 10/22/2013,9:58 AM

## 2013-10-23 LAB — CBC WITH DIFFERENTIAL/PLATELET
Basophils Absolute: 0 10*3/uL (ref 0.0–0.1)
Basophils Relative: 0 % (ref 0–1)
EOS ABS: 0 10*3/uL (ref 0.0–0.7)
Eosinophils Relative: 0 % (ref 0–5)
HCT: 20.4 % — ABNORMAL LOW (ref 39.0–52.0)
Hemoglobin: 7.3 g/dL — ABNORMAL LOW (ref 13.0–17.0)
LYMPHS ABS: 0.4 10*3/uL — AB (ref 0.7–4.0)
Lymphocytes Relative: 3 % — ABNORMAL LOW (ref 12–46)
MCH: 35.3 pg — AB (ref 26.0–34.0)
MCHC: 35.8 g/dL (ref 30.0–36.0)
MCV: 98.6 fL (ref 78.0–100.0)
Monocytes Absolute: 0.5 10*3/uL (ref 0.1–1.0)
Monocytes Relative: 5 % (ref 3–12)
Neutro Abs: 9.7 10*3/uL — ABNORMAL HIGH (ref 1.7–7.7)
Neutrophils Relative %: 92 % — ABNORMAL HIGH (ref 43–77)
PLATELETS: 99 10*3/uL — AB (ref 150–400)
RBC: 2.07 MIL/uL — AB (ref 4.22–5.81)
RDW: 15.8 % — ABNORMAL HIGH (ref 11.5–15.5)
WBC: 10.6 10*3/uL — ABNORMAL HIGH (ref 4.0–10.5)

## 2013-10-23 LAB — BASIC METABOLIC PANEL
Anion gap: 15 (ref 5–15)
BUN: 67 mg/dL — AB (ref 6–23)
CALCIUM: 8.8 mg/dL (ref 8.4–10.5)
CO2: 20 mEq/L (ref 19–32)
CREATININE: 4.23 mg/dL — AB (ref 0.50–1.35)
Chloride: 102 mEq/L (ref 96–112)
GFR calc Af Amer: 13 mL/min — ABNORMAL LOW (ref >90)
GFR, EST NON AFRICAN AMERICAN: 11 mL/min — AB (ref >90)
GLUCOSE: 182 mg/dL — AB (ref 70–99)
POTASSIUM: 4.6 meq/L (ref 3.7–5.3)
Sodium: 137 mEq/L (ref 137–147)

## 2013-10-23 LAB — SAMPLE TO BLOOD BANK

## 2013-10-23 LAB — PREPARE RBC (CROSSMATCH)

## 2013-10-23 LAB — HEMOGLOBIN AND HEMATOCRIT, BLOOD
HEMATOCRIT: 25.3 % — AB (ref 39.0–52.0)
Hemoglobin: 8.9 g/dL — ABNORMAL LOW (ref 13.0–17.0)

## 2013-10-23 LAB — ABO/RH: ABO/RH(D): A POS

## 2013-10-23 MED ORDER — FUROSEMIDE 10 MG/ML IJ SOLN
20.0000 mg | Freq: Once | INTRAMUSCULAR | Status: DC
  Filled 2013-10-23: qty 2

## 2013-10-23 MED ORDER — SODIUM CHLORIDE 0.9 % IV SOLN: Freq: Once | INTRAVENOUS | Status: DC

## 2013-10-23 MED ORDER — DIPHENHYDRAMINE HCL 25 MG PO CAPS
25.0000 mg | ORAL_CAPSULE | Freq: Once | ORAL | Status: AC
  Administered 2013-10-23: 25 mg via ORAL
  Filled 2013-10-23: qty 1

## 2013-10-23 MED ORDER — POLYETHYLENE GLYCOL 3350 17 G PO PACK
17.0000 g | PACK | Freq: Every day | ORAL | Status: DC
  Administered 2013-10-23 – 2013-10-28 (×6): 17 g via ORAL
  Filled 2013-10-23 (×6): qty 1

## 2013-10-23 MED ORDER — FUROSEMIDE 10 MG/ML IJ SOLN
160.0000 mg | Freq: Two times a day (BID) | INTRAVENOUS | Status: DC
  Administered 2013-10-23 – 2013-10-24 (×2): 160 mg via INTRAVENOUS
  Filled 2013-10-23 (×6): qty 16

## 2013-10-23 MED ORDER — ACETAMINOPHEN 325 MG PO TABS
650.0000 mg | ORAL_TABLET | Freq: Once | ORAL | Status: AC
  Administered 2013-10-23: 650 mg via ORAL
  Filled 2013-10-23: qty 2

## 2013-10-23 NOTE — Progress Notes (Signed)
Subjective: He doesn't feel as well today. He is coughing up blood still. No other new complaints.  Objective: Vital signs in last 24 hours: Temp:  [97.3 F (36.3 C)-98 F (36.7 C)] 97.5 F (36.4 C) (10/09 0604) Pulse Rate:  [84-89] 87 (10/09 0604) Resp:  [12-18] 12 (10/09 0604) BP: (113-119)/(51-62) 119/62 mmHg (10/09 0604) SpO2:  [76 %-97 %] 94 % (10/09 0756) Weight change:  Last BM Date: 10/19/13  Intake/Output from previous day: 10/08 0701 - 10/09 0700 In: 560 [P.O.:560] Out: 701 [Urine:701]  PHYSICAL EXAM General appearance: alert, cooperative and moderate distress Resp: rhonchi bilaterally Cardio: regular rate and rhythm, S1, S2 normal, no murmur, click, rub or gallop GI: soft, non-tender; bowel sounds normal; no masses,  no organomegaly Extremities: extremities normal, atraumatic, no cyanosis or edema  Lab Results:  Results for orders placed during the hospital encounter of 10/18/13 (from the past 48 hour(s))  BASIC METABOLIC PANEL     Status: Abnormal   Collection Time    10/22/13  9:02 AM      Result Value Ref Range   Sodium 138  137 - 147 mEq/L   Potassium 4.5  3.7 - 5.3 mEq/L   Chloride 102  96 - 112 mEq/L   CO2 18 (*) 19 - 32 mEq/L   Glucose, Bld 192 (*) 70 - 99 mg/dL   BUN 55 (*) 6 - 23 mg/dL   Creatinine, Ser 4.05 (*) 0.50 - 1.35 mg/dL   Calcium 9.2  8.4 - 10.5 mg/dL   GFR calc non Af Amer 12 (*) >90 mL/min   GFR calc Af Amer 14 (*) >90 mL/min   Comment: (NOTE)     The eGFR has been calculated using the CKD EPI equation.     This calculation has not been validated in all clinical situations.     eGFR's persistently <90 mL/min signify possible Chronic Kidney     Disease.   Anion gap 18 (*) 5 - 15  CBC WITH DIFFERENTIAL     Status: Abnormal   Collection Time    10/22/13  9:02 AM      Result Value Ref Range   WBC 6.9  4.0 - 10.5 K/uL   RBC 2.22 (*) 4.22 - 5.81 MIL/uL   Hemoglobin 7.6 (*) 13.0 - 17.0 g/dL   HCT 22.0 (*) 39.0 - 52.0 %   MCV 99.1   78.0 - 100.0 fL   MCH 34.2 (*) 26.0 - 34.0 pg   MCHC 34.5  30.0 - 36.0 g/dL   RDW 15.9 (*) 11.5 - 15.5 %   Platelets 94 (*) 150 - 400 K/uL   Neutrophils Relative % 91 (*) 43 - 77 %   Neutro Abs 6.2  1.7 - 7.7 K/uL   Lymphocytes Relative 5 (*) 12 - 46 %   Lymphs Abs 0.4 (*) 0.7 - 4.0 K/uL   Monocytes Relative 4  3 - 12 %   Monocytes Absolute 0.3  0.1 - 1.0 K/uL   Eosinophils Relative 0  0 - 5 %   Eosinophils Absolute 0.0  0.0 - 0.7 K/uL   Basophils Relative 0  0 - 1 %   Basophils Absolute 0.0  0.0 - 0.1 K/uL   Smear Review SPECIMEN CHECKED FOR CLOTS     Comment: PLATELETS APPEAR DECREASED     PLATELET COUNT CONFIRMED BY SMEAR  BASIC METABOLIC PANEL     Status: Abnormal   Collection Time    10/23/13  6:25 AM  Result Value Ref Range   Sodium 137  137 - 147 mEq/L   Potassium 4.6  3.7 - 5.3 mEq/L   Chloride 102  96 - 112 mEq/L   CO2 20  19 - 32 mEq/L   Glucose, Bld 182 (*) 70 - 99 mg/dL   BUN 67 (*) 6 - 23 mg/dL   Creatinine, Ser 4.23 (*) 0.50 - 1.35 mg/dL   Calcium 8.8  8.4 - 10.5 mg/dL   GFR calc non Af Amer 11 (*) >90 mL/min   GFR calc Af Amer 13 (*) >90 mL/min   Comment: (NOTE)     The eGFR has been calculated using the CKD EPI equation.     This calculation has not been validated in all clinical situations.     eGFR's persistently <90 mL/min signify possible Chronic Kidney     Disease.   Anion gap 15  5 - 15  CBC WITH DIFFERENTIAL     Status: Abnormal   Collection Time    10/23/13  6:25 AM      Result Value Ref Range   WBC 10.6 (*) 4.0 - 10.5 K/uL   RBC 2.07 (*) 4.22 - 5.81 MIL/uL   Hemoglobin 7.3 (*) 13.0 - 17.0 g/dL   HCT 20.4 (*) 39.0 - 52.0 %   MCV 98.6  78.0 - 100.0 fL   MCH 35.3 (*) 26.0 - 34.0 pg   MCHC 35.8  30.0 - 36.0 g/dL   RDW 15.8 (*) 11.5 - 15.5 %   Platelets 99 (*) 150 - 400 K/uL   Comment: SPECIMEN CHECKED FOR CLOTS     CONSISTENT WITH PREVIOUS RESULT   Neutrophils Relative % 92 (*) 43 - 77 %   Neutro Abs 9.7 (*) 1.7 - 7.7 K/uL   Lymphocytes  Relative 3 (*) 12 - 46 %   Lymphs Abs 0.4 (*) 0.7 - 4.0 K/uL   Monocytes Relative 5  3 - 12 %   Monocytes Absolute 0.5  0.1 - 1.0 K/uL   Eosinophils Relative 0  0 - 5 %   Eosinophils Absolute 0.0  0.0 - 0.7 K/uL   Basophils Relative 0  0 - 1 %   Basophils Absolute 0.0  0.0 - 0.1 K/uL    ABGS No results found for this basename: PHART, PCO2, PO2ART, TCO2, HCO3,  in the last 72 hours CULTURES No results found for this or any previous visit (from the past 240 hour(s)). Studies/Results: No results found.  Medications:  Prior to Admission:  Prescriptions prior to admission  Medication Sig Dispense Refill  . allopurinol (ZYLOPRIM) 300 MG tablet Take 300 mg by mouth every morning.       . clopidogrel (PLAVIX) 75 MG tablet Take 75 mg by mouth every morning.       . donepezil (ARICEPT) 10 MG tablet Take 10 mg by mouth at bedtime.       . famotidine (PEPCID) 20 MG tablet TAKE ONE TABLET BY MOUTH TWICE DAILY.  60 tablet  5  . furosemide (LASIX) 40 MG tablet Take 20 mg by mouth daily.       . isosorbide mononitrate (IMDUR) 30 MG 24 hr tablet Take 0.5 tablets (15 mg total) by mouth daily.  30 tablet  11  . Linaclotide (LINZESS) 145 MCG CAPS capsule Take 1 capsule (145 mcg total) by mouth daily.  30 capsule  5  . metoprolol tartrate (LOPRESSOR) 25 MG tablet Take 0.5 tablets (12.5 mg total) by mouth 2 (two) times  daily.  60 tablet  3  . nitroGLYCERIN (NITROSTAT) 0.4 MG SL tablet Place 1 tablet (0.4 mg total) under the tongue every 5 (five) minutes as needed for chest pain. 1 tablet under tongue at onset of chest pain;you may repeat every 5 minutes for up to 3 doses.  25 tablet  3  . Oxcarbazepine (TRILEPTAL) 300 MG tablet Take 300 mg by mouth 2 (two) times daily.       Marland Kitchen oxybutynin (DITROPAN) 5 MG tablet Take 5 mg by mouth 2 (two) times daily.       . pravastatin (PRAVACHOL) 40 MG tablet Take 40 mg by mouth daily. Daily at bedtime      . Tamsulosin HCl (FLOMAX) 0.4 MG CAPS Take 0.4 mg by mouth at  bedtime.       Marland Kitchen tiotropium (SPIRIVA HANDIHALER) 18 MCG inhalation capsule Place 1 capsule (18 mcg total) into inhaler and inhale daily.  30 capsule  6  . VITAMIN B1-B12 IM Inject into the muscle every 30 (thirty) days. Once a month       Scheduled: . sodium chloride   Intravenous Once  . acetaminophen  650 mg Oral Once  . albuterol  2.5 mg Nebulization QID  . allopurinol  300 mg Oral q morning - 10a  . aspirin  81 mg Oral Daily  . clopidogrel  75 mg Oral q morning - 10a  . darbepoetin  60 mcg Subcutaneous Q7 days  . diphenhydrAMINE  25 mg Oral Once  . donepezil  10 mg Oral QHS  . famotidine  20 mg Oral Daily  . furosemide  100 mg Intravenous BID  . furosemide  20 mg Intravenous Once  . isosorbide mononitrate  15 mg Oral Daily  . levofloxacin (LEVAQUIN) IV  500 mg Intravenous Q48H  . Linaclotide  145 mcg Oral Daily  . methylPREDNISolone (SOLU-MEDROL) injection  40 mg Intravenous Q6H  . metoprolol tartrate  12.5 mg Oral BID  . Oxcarbazepine  300 mg Oral BID  . oxybutynin  5 mg Oral BID  . simvastatin  20 mg Oral q1800  . sodium chloride  3 mL Intravenous Q12H  . tamsulosin  0.4 mg Oral QHS  . tiotropium  18 mcg Inhalation Daily   Continuous: . sodium chloride 75 mL/hr at 10/22/13 3546   FKC:[id number], acetaminophen, albuterol, alum & mag hydroxide-simeth, docusate sodium, nitroGLYCERIN, ondansetron (ZOFRAN) IV, ondansetron  Assesment: He was admitted with an acute stroke. He also had an acute kidney injury. 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 He is anemic now which is a combination of anemia of chronic disease and hemoptysis. Principal Problem:   Acute lacunar infarction Active Problems:   Acute kidney injury   Anemia   Hemoptysis    Plan: I am going to plan for blood transfusion today. Continue other treatments. He is on steroids and antibiotics I discussed the fact that I think he's probably going to have to do a rehabilitation stay  off 4 he can go directly home    LOS: 5 days   Yechiel Erny L 10/23/2013, 9:15 AM

## 2013-10-23 NOTE — Progress Notes (Signed)
Second unit PRBC's started at this time.

## 2013-10-23 NOTE — Progress Notes (Signed)
Subjective: Interval History: Patient still with cough and blood in his sputum. His appetite is poor but no nuasea or vomitng  Objective: Vital signs in last 24 hours: Temp:  [97.3 F (36.3 C)-98 F (36.7 C)] 97.5 F (36.4 C) (10/09 0604) Pulse Rate:  [84-89] 87 (10/09 0604) Resp:  [12-18] 12 (10/09 0604) BP: (113-119)/(51-62) 119/62 mmHg (10/09 0604) SpO2:  [76 %-97 %] 94 % (10/09 0756) Weight change:   Intake/Output from previous day: 10/08 0701 - 10/09 0700 In: 560 [P.O.:560] Out: 701 [Urine:701] Intake/Output this shift:    Generally he is alert and in no apparent distress Chest decrease breath sound and clear heart RRR no murmur Abdomen none tender and positive bowl sound Extremities  1+edema  Lab Results:  Recent Labs  10/22/13 0902 10/23/13 0625  WBC 6.9 10.6*  HGB 7.6* 7.3*  HCT 22.0* 20.4*  PLT 94* 99*   BMET:   Recent Labs  10/22/13 0902 10/23/13 0625  NA 138 137  K 4.5 4.6  CL 102 102  CO2 18* 20  GLUCOSE 192* 182*  BUN 55* 67*  CREATININE 4.05* 4.23*  CALCIUM 9.2 8.8   No results found for this basename: PTH,  in the last 72 hours Iron Studies:  No results found for this basename: IRON, TIBC, TRANSFERRIN, FERRITIN,  in the last 72 hours  Studies/Results: No results found.  I have reviewed the patient's current medications.  Assessment/Plan: Problem #1 acute kidney injury super imposed on chronic his BUN and creatinine seems increasing after showing some improvement. Possible ATN. He remains none oliguric but his urine out put has declined . Problem #2 hypertension: His blood pressure is reasonably controlled Problem #3 history of cough with some hemoptysis. No much improvement today . Patient on solu-medrol Problem #4 coronary artery disease Problem #5 anemia: His hemoglobin hematocrit is low and declining. His iron saturation and ferritin is normal hence anemia of chronic disease including chronic renal failure. Patient started on  ARANESP. Problem #6 TIA: Presently doesn't have any weakness and getting better. Problem #7 metabolic bone disease: His calcium and phosphorus is range. Problem #8 chronic renal failure: Stage IV. Etiology was thought to be secondary to diabetes/hypertension/ischemic. Problem#9Low CO2 possibly metabolic.unable to get blood gas yesterday. Seems better Plan: We Increase his lasix to 160 mg iv bid We will d/c ivf Patient does not need dialysis for now but I have discussed with him about the possiblty of dialysis if his renal function continue to decline. Patient does not want to dialysis if it comes to that. We'll check his basic metabolic panel in the morning.  LOS: 5 days   [name] S 10/23/2013,10:44 AM

## 2013-10-23 NOTE — Clinical Social Work Psychosocial (Signed)
Clinical Social Work Department BRIEF PSYCHOSOCIAL ASSESSMENT 10/23/2013  Patient:  [name], [name]     Account Number:  [id number]     Admit date:  10/18/2013  Clinical Social Worker:  Wyatt Haste  Date/Time:  10/23/2013 02:36 PM  Referred by:  CSW  Date Referred:  10/23/2013 Referred for  SNF Placement   Other Referral:   Interview type:  Patient Other interview type:   Curt Bears- daughter-in-law    PSYCHOSOCIAL DATA Living Status:  FACILITY Admitted from facility:   Level of care:   Primary support name:  Geraldine/Frank/Kathryn Primary support relationship to patient:  CHILD, ADULT Degree of support available:   supportive    CURRENT CONCERNS Current Concerns  Post-Acute Placement   Other Concerns:    SOCIAL WORK ASSESSMENT / PLAN CSW met with pt at bedside. Pt's sister and niece were also present with his permission. Pt alert and oriented and reports he lives alone. He shared that he was excited about the birth of his great grandson today.  Pt has lived alone for many years. His wife suffered a stroke and spent 17 years at [facility] prior to her death in 28. His daughter, Mikle Bosworth lives next door and checks on pt daily. Pt also has very supportive son and daughter-in-law, Pilar Plate and Curt Bears. Family provide transportation and meals for pt. He indicates he has been having a more difficult time at home and states it is all he can do to get to the bathroom and back. Pt ambulates with a walker. PT evaluated pt and recommendation is for SNF. CSW discussed placement process. Family had already shared need for rehab with pt. He admits that he is feeling very weak and needs to be stronger prior to return home. Pt requesting [place] only at this point as he was pleased with care his wife received there. CSW updated Curt Bears on conversation. Will initiate bed search. Pt is aware of Medicare coverage/criteria for SNF.   Assessment/plan status:  Psychosocial Support/Ongoing Assessment of  Needs Other assessment/ plan:   Information/referral to community resources:   SNF list    PATIENT'S/FAMILY'S RESPONSE TO PLAN OF CARE: Pt and family aware and agreeable to SNF at d/c. CSW will follow up.       Benay Pike, [place]

## 2013-10-23 NOTE — Clinical Social Work Placement (Signed)
Clinical Social Work Department CLINICAL SOCIAL WORK PLACEMENT NOTE 10/23/2013  Patient:  DETAVIOUS, RINN  Account Number:  [id number] Admit date:  10/18/2013  Clinical Social Worker:  Benay Pike, LCSW  Date/time:  10/23/2013 02:31 PM  Clinical Social Work is seeking post-discharge placement for this patient at the following level of care:   SKILLED NURSING   (*CSW will update this form in Epic as items are completed)   10/23/2013  Patient/family provided with [facility] Department of Clinical Social Work's list of facilities offering this level of care within the geographic area requested by the patient (or if unable, by the patient's family).  10/23/2013  Patient/family informed of their freedom to choose among providers that offer the needed level of care, that participate in Medicare, Medicaid or managed care program needed by the patient, have an available bed and are willing to accept the patient.  10/23/2013  Patient/family informed of [facility]' ownership interest in [facility], as well as of the fact that they are under no obligation to receive care at this facility.  PASARR submitted to EDS on 10/23/2013 PASARR number received on 10/23/2013  FL2 transmitted to all facilities in geographic area requested by pt/family on  10/23/2013 FL2 transmitted to all facilities within larger geographic area on   Patient informed that his/her managed care company has contracts with or will negotiate with  certain facilities, including the following:     Patient/family informed of bed offers received:   Patient chooses bed at  Physician recommends and patient chooses bed at    Patient to be transferred to  on   Patient to be transferred to facility by  Patient and family notified of transfer on  Name of family member notified:    The following physician request were entered in Epic:   Additional Comments:  Benay Pike, [place]

## 2013-10-23 NOTE — Progress Notes (Signed)
IV lasix given at this time between 2 units PRBC's

## 2013-10-23 NOTE — Evaluation (Signed)
Physical Therapy Evaluation Patient Details Name: [name] MRN: [id number] DOB: 29-Jun-1921 Today's Date: 10/23/2013   History of Present Illness  With a history of 3 previous strokes, CT DX stage III, COPD and called stage A., coronary artery disease, hypertension, hyperlipidemia, emphysema, stable abdominal aneurysm, GERD. He presents to the hospital after having right arm pain with difficulty moving his right arm that was accompanied by slurred speech. These symptoms lasted about an hour. He went to bed last night around 9 or 10 PM and slept until 9 this morning. His son called him and spoke to him and, though he sounded groggy, his speech was normal. When his son called him back at noon, and he had slurred speech and difficulty word finding. Additionally, he dropped the phone several times. EMS was called and by the time he arrived to the hospital his symptoms had resolved.  Pt also has hemoptysis.  Clinical Impression   Pt was seen for evaluation.  Per family report, he has been living alone with family next door.  He has been using a cane for gait.  Family reports that pt has been in a decline over the past several months and is very inactive at home and has an unsteady gait.  He is found to have generalized weakness with significant deconditioning.   It takes about 5 minutes to recover his breath after walking about 8' while on 3 L O2.  We initiated very gentle bed exercise and assisted him to a chair at the end of the visit.  He definitely needs SNF at d/c.    Follow Up Recommendations SNF    Equipment Recommendations  None recommended by PT    Recommendations for Other Services   OT    Precautions / Restrictions Precautions Precautions: Fall Restrictions Weight Bearing Restrictions: No      Mobility  Bed Mobility Overal bed mobility: Modified Independent                Transfers Overall transfer level: Modified independent Equipment used: Rolling walker (2  wheeled)                Ambulation/Gait Ambulation/Gait assistance: Min guard Ambulation Distance (Feet): 10 Feet Assistive device: Rolling walker (2 wheeled) Gait Pattern/deviations: Trunk flexed   Gait velocity interpretation: Below normal speed for age/gender General Gait Details: pt 's exertional level is poor and he becomes quite dyspneic with minimal exertion...Marland Kitchenhe is on 3 L O2  Stairs            Wheelchair Mobility    Modified Rankin (Stroke Patients Only) Modified Rankin (Stroke Patients Only) Pre-Morbid Rankin Score: Moderate disability Modified Rankin: Moderately severe disability     Balance Overall balance assessment: Needs assistance Sitting-balance support: No upper extremity supported;Feet supported Sitting balance-Leahy Scale: Good     Standing balance support: Single extremity supported Standing balance-Leahy Scale: Fair                               Pertinent Vitals/Pain Pain Assessment: No/denies pain    Home Living Family/patient expects to be discharged to:: Skilled nursing facility                      Prior Function Level of Independence: Independent with assistive device(s)         Comments: pt usually ambulated with a cane     Hand Dominance   Dominant Hand: Right  Extremity/Trunk Assessment   Upper Extremity Assessment: Generalized weakness           Lower Extremity Assessment: Generalized weakness      Cervical / Trunk Assessment: Kyphotic  Communication   Communication: No difficulties  Cognition Arousal/Alertness: Awake/alert Behavior During Therapy: WFL for tasks assessed/performed Overall Cognitive Status: Within Functional Limits for tasks assessed                      General Comments      Exercises General Exercises - Lower Extremity Ankle Circles/Pumps: AROM;Both;10 reps;Supine Quad Sets: AROM;Both;10 reps;Supine Gluteal Sets: AROM;Both;10 reps;Supine Short  Arc Quad: AROM;Both;10 reps;Supine Heel Slides: AROM;Both;5 reps;Supine      Assessment/Plan    PT Assessment Patient needs continued PT services  PT Diagnosis Difficulty walking;Generalized weakness   PT Problem List Decreased strength;Decreased activity tolerance;Decreased balance;Decreased mobility;Cardiopulmonary status limiting activity  PT Treatment Interventions Gait training;Functional mobility training;Therapeutic exercise   PT Goals (Current goals can be found in the Care Plan section) Acute Rehab PT Goals Patient Stated Goal: return home PT Goal Formulation: With patient/family Time For Goal Achievement: 11/06/13 Potential to Achieve Goals: Fair    Frequency Min 3X/week   Barriers to discharge Decreased caregiver support      Co-evaluation               End of Session Equipment Utilized During Treatment: Gait belt Activity Tolerance: Patient limited by fatigue Patient left: in chair;with call bell/phone within reach;with family/visitor present (family will not leave pt alone) Nurse Communication: Mobility status         Time: 0923-1000 PT Time Calculation (min): 37 min   Charges:   PT Evaluation $Initial PT Evaluation Tier I: 1 Procedure PT Treatments $Therapeutic Exercise: 8-22 mins   PT G Codes:          Demetrios Isaacs L 10/23/2013, 10:04 AM

## 2013-10-24 LAB — TYPE AND SCREEN
ABO/RH(D): A POS
ANTIBODY SCREEN: NEGATIVE
UNIT DIVISION: 0
Unit division: 0

## 2013-10-24 LAB — CBC WITH DIFFERENTIAL/PLATELET
Basophils Absolute: 0 10*3/uL (ref 0.0–0.1)
Basophils Relative: 0 % (ref 0–1)
EOS PCT: 0 % (ref 0–5)
Eosinophils Absolute: 0 10*3/uL (ref 0.0–0.7)
HEMATOCRIT: 25.5 % — AB (ref 39.0–52.0)
Hemoglobin: 8.8 g/dL — ABNORMAL LOW (ref 13.0–17.0)
LYMPHS ABS: 0.5 10*3/uL — AB (ref 0.7–4.0)
LYMPHS PCT: 5 % — AB (ref 12–46)
MCH: 33.3 pg (ref 26.0–34.0)
MCHC: 34.5 g/dL (ref 30.0–36.0)
MCV: 96.6 fL (ref 78.0–100.0)
MONO ABS: 0.5 10*3/uL (ref 0.1–1.0)
MONOS PCT: 5 % (ref 3–12)
Neutro Abs: 9 10*3/uL — ABNORMAL HIGH (ref 1.7–7.7)
Neutrophils Relative %: 90 % — ABNORMAL HIGH (ref 43–77)
PLATELETS: 100 10*3/uL — AB (ref 150–400)
RBC: 2.64 MIL/uL — AB (ref 4.22–5.81)
RDW: 15.6 % — ABNORMAL HIGH (ref 11.5–15.5)
WBC: 10.1 10*3/uL (ref 4.0–10.5)

## 2013-10-24 LAB — BASIC METABOLIC PANEL
Anion gap: 17 — ABNORMAL HIGH (ref 5–15)
BUN: 76 mg/dL — ABNORMAL HIGH (ref 6–23)
CO2: 20 meq/L (ref 19–32)
Calcium: 9 mg/dL (ref 8.4–10.5)
Chloride: 100 mEq/L (ref 96–112)
Creatinine, Ser: 4.43 mg/dL — ABNORMAL HIGH (ref 0.50–1.35)
GFR calc Af Amer: 12 mL/min — ABNORMAL LOW (ref >90)
GFR calc non Af Amer: 11 mL/min — ABNORMAL LOW (ref >90)
GLUCOSE: 188 mg/dL — AB (ref 70–99)
POTASSIUM: 4.6 meq/L (ref 3.7–5.3)
SODIUM: 137 meq/L (ref 137–147)

## 2013-10-24 MED ORDER — LEVOFLOXACIN 500 MG PO TABS
500.0000 mg | ORAL_TABLET | ORAL | Status: DC
  Administered 2013-10-26 – 2013-10-28 (×2): 500 mg via ORAL
  Filled 2013-10-24 (×2): qty 1

## 2013-10-24 MED ORDER — TORSEMIDE 20 MG PO TABS
40.0000 mg | ORAL_TABLET | Freq: Every day | ORAL | Status: DC
  Administered 2013-10-24 – 2013-10-27 (×4): 40 mg via ORAL
  Filled 2013-10-24 (×4): qty 2

## 2013-10-24 NOTE — Progress Notes (Signed)
Subjective: Interval History: Patient still with cough and blood in his sputum. Not feeling good. His appetite is not good but no nausea or vomitng  Objective: Vital signs in last 24 hours: Temp:  [97.3 F (36.3 C)-98.2 F (36.8 C)] 97.3 F (36.3 C) (10/10 0655) Pulse Rate:  [81-91] 88 (10/10 0655) Resp:  [18-22] 20 (10/10 0655) BP: (114-135)/(53-71) 114/66 mmHg (10/10 0655) SpO2:  [91 %-97 %] 92 % (10/10 0758) Weight change:   Intake/Output from previous day: 10/09 0701 - 10/10 0700 In: 893 [Blood:707; IV Piggyback:186] Out: 1100 [Urine:1100] Intake/Output this shift:    Generally he is alert and in no apparent distress Chest decrease breath sound and clear heart RRR no murmur Abdomen none tender and positive bowl sound Extremities  1+edema  Lab Results:  Recent Labs  10/23/13 0625 10/23/13 2317 10/24/13 0552  WBC 10.6*  --  10.1  HGB 7.3* 8.9* 8.8*  HCT 20.4* 25.3* 25.5*  PLT 99*  --  100*   BMET:   Recent Labs  10/23/13 0625 10/24/13 0552  NA 137 137  K 4.6 4.6  CL 102 100  CO2 20 20  GLUCOSE 182* 188*  BUN 67* 76*  CREATININE 4.23* 4.43*  CALCIUM 8.8 9.0   No results found for this basename: PTH,  in the last 72 hours Iron Studies:  No results found for this basename: IRON, TIBC, TRANSFERRIN, FERRITIN,  in the last 72 hours  Studies/Results: No results found.  I have reviewed the patient'Strickland current medications.  Assessment/Plan: Problem #1 acute kidney injury super imposed on chronic his BUN and creatinine seems increasing . Presently on lasix and none oliguric. . Problem #2 hypertension: His blood pressure is reasonably controlled Problem #3 history of cough with some hemoptysis. Patient continue to complaint no much improvement Problem #4 coronary artery disease Problem #5 anemia: His hemoglobin hematocrit is low and declining. His iron saturation and ferritin is normal hence anemia of chronic disease including chronic renal failure. Patient  started on ARANESP. Problem #6 TIA: Presently doesn't have any weakness and getting better. Problem #7 metabolic bone disease: His calcium and phosphorus is range. Problem #8 chronic renal failure: Stage IV. Etiology was thought to be secondary to diabetes/hypertension/ischemic.Patient with poor appetite but no nausea or vomitng Problem#9 Metabolic acidosis: It is  stable on sodium bicarbonate Plan: We d/c iv lasix Demadex 40 mg on po once day Patient does not want dialysis We'll check his basic metabolic panel in the morning.  LOS: 6 days   [name] 10/24/2013,9:38 AM

## 2013-10-24 NOTE — Progress Notes (Signed)
Subjective: He says he feels better. He was able to sit up yesterday. No other new complaints. He is still coughing up blood.  Objective: Vital signs in last 24 hours: Temp:  [97.3 F (36.3 C)-98.2 F (36.8 C)] 97.3 F (36.3 C) (10/10 0655) Pulse Rate:  [81-91] 88 (10/10 0655) Resp:  [18-22] 20 (10/10 0655) BP: (114-135)/(53-71) 114/66 mmHg (10/10 0655) SpO2:  [91 %-97 %] 92 % (10/10 0758) Weight change:  Last BM Date: 10/19/13  Intake/Output from previous day: 10/09 0701 - 10/10 0700 In: 893 [Blood:707; IV Piggyback:186] Out: 1100 [Urine:1100]  PHYSICAL EXAM General appearance: alert and no distress  Lab Results:  Results for orders placed during the hospital encounter of 10/18/13 (from the past 48 hour(s))  BASIC METABOLIC PANEL     Status: Abnormal   Collection Time    10/23/13  6:25 AM      Result Value Ref Range   Sodium 137  137 - 147 mEq/L   Potassium 4.6  3.7 - 5.3 mEq/L   Chloride 102  96 - 112 mEq/L   CO2 20  19 - 32 mEq/L   Glucose, Bld 182 (*) 70 - 99 mg/dL   BUN 67 (*) 6 - 23 mg/dL   Creatinine, Ser 4.23 (*) 0.50 - 1.35 mg/dL   Calcium 8.8  8.4 - 10.5 mg/dL   GFR calc non Af Amer 11 (*) >90 mL/min   GFR calc Af Amer 13 (*) >90 mL/min   Comment: (NOTE)     The eGFR has been calculated using the CKD EPI equation.     This calculation has not been validated in all clinical situations.     eGFR's persistently <90 mL/min signify possible Chronic Kidney     Disease.   Anion gap 15  5 - 15  CBC WITH DIFFERENTIAL     Status: Abnormal   Collection Time    10/23/13  6:25 AM      Result Value Ref Range   WBC 10.6 (*) 4.0 - 10.5 K/uL   RBC 2.07 (*) 4.22 - 5.81 MIL/uL   Hemoglobin 7.3 (*) 13.0 - 17.0 g/dL   HCT 20.4 (*) 39.0 - 52.0 %   MCV 98.6  78.0 - 100.0 fL   MCH 35.3 (*) 26.0 - 34.0 pg   MCHC 35.8  30.0 - 36.0 g/dL   RDW 15.8 (*) 11.5 - 15.5 %   Platelets 99 (*) 150 - 400 K/uL   Comment: SPECIMEN CHECKED FOR CLOTS     CONSISTENT WITH PREVIOUS RESULT    Neutrophils Relative % 92 (*) 43 - 77 %   Neutro Abs 9.7 (*) 1.7 - 7.7 K/uL   Lymphocytes Relative 3 (*) 12 - 46 %   Lymphs Abs 0.4 (*) 0.7 - 4.0 K/uL   Monocytes Relative 5  3 - 12 %   Monocytes Absolute 0.5  0.1 - 1.0 K/uL   Eosinophils Relative 0  0 - 5 %   Eosinophils Absolute 0.0  0.0 - 0.7 K/uL   Basophils Relative 0  0 - 1 %   Basophils Absolute 0.0  0.0 - 0.1 K/uL  SAMPLE TO BLOOD BANK     Status: None   Collection Time    10/23/13  9:41 AM      Result Value Ref Range   Blood Bank Specimen SAMPLE AVAILABLE FOR TESTING     Sample Expiration 10/26/2013    TYPE AND SCREEN     Status: None   Collection  Time    10/23/13  9:43 AM      Result Value Ref Range   ABO/RH(D) A POS     Antibody Screen NEG     Sample Expiration 10/26/2013     Unit Number [id number]     Blood Component Type RED CELLS,LR     Unit division 00     Status of Unit ISSUED,FINAL     Transfusion Status OK TO TRANSFUSE     Crossmatch Result Compatible     Unit Number [id number]     Blood Component Type RED CELLS,LR     Unit division 00     Status of Unit ISSUED,FINAL     Transfusion Status OK TO TRANSFUSE     Crossmatch Result Compatible    ABO/RH     Status: None   Collection Time    10/23/13  9:43 AM      Result Value Ref Range   ABO/RH(D) A POS    PREPARE RBC (CROSSMATCH)     Status: None   Collection Time    10/23/13  9:43 AM      Result Value Ref Range   Order Confirmation ORDER PROCESSED BY BLOOD BANK    HEMOGLOBIN AND HEMATOCRIT, BLOOD     Status: Abnormal   Collection Time    10/23/13 11:17 PM      Result Value Ref Range   Hemoglobin 8.9 (*) 13.0 - 17.0 g/dL   Comment: POST TRANSFUSION SPECIMEN   HCT 25.3 (*) 39.0 - 38.1 %  BASIC METABOLIC PANEL     Status: Abnormal   Collection Time    10/24/13  5:52 AM      Result Value Ref Range   Sodium 137  137 - 147 mEq/L   Potassium 4.6  3.7 - 5.3 mEq/L   Chloride 100  96 - 112 mEq/L   CO2 20  19 - 32 mEq/L   Glucose, Bld 188 (*) 70  - 99 mg/dL   BUN 76 (*) 6 - 23 mg/dL   Creatinine, Ser 4.43 (*) 0.50 - 1.35 mg/dL   Calcium 9.0  8.4 - 10.5 mg/dL   GFR calc non Af Amer 11 (*) >90 mL/min   GFR calc Af Amer 12 (*) >90 mL/min   Comment: (NOTE)     The eGFR has been calculated using the CKD EPI equation.     This calculation has not been validated in all clinical situations.     eGFR's persistently <90 mL/min signify possible Chronic Kidney     Disease.   Anion gap 17 (*) 5 - 15  CBC WITH DIFFERENTIAL     Status: Abnormal   Collection Time    10/24/13  5:52 AM      Result Value Ref Range   WBC 10.1  4.0 - 10.5 K/uL   Comment: WHITE COUNT CONFIRMED ON SMEAR   RBC 2.64 (*) 4.22 - 5.81 MIL/uL   Hemoglobin 8.8 (*) 13.0 - 17.0 g/dL   HCT 25.5 (*) 39.0 - 52.0 %   MCV 96.6  78.0 - 100.0 fL   MCH 33.3  26.0 - 34.0 pg   MCHC 34.5  30.0 - 36.0 g/dL   RDW 15.6 (*) 11.5 - 15.5 %   Platelets 100 (*) 150 - 400 K/uL   Neutrophils Relative % 90 (*) 43 - 77 %   Neutro Abs 9.0 (*) 1.7 - 7.7 K/uL   Lymphocytes Relative 5 (*) 12 - 46 %   Lymphs Abs  0.5 (*) 0.7 - 4.0 K/uL   Monocytes Relative 5  3 - 12 %   Monocytes Absolute 0.5  0.1 - 1.0 K/uL   Eosinophils Relative 0  0 - 5 %   Eosinophils Absolute 0.0  0.0 - 0.7 K/uL   Basophils Relative 0  0 - 1 %   Basophils Absolute 0.0  0.0 - 0.1 K/uL   WBC Morphology SPECIMEN CHECKED FOR CLOTS     Comment: PLATELET COUNT CONFIRMED BY SMEAR   RBC Morphology POLYCHROMASIA PRESENT     Comment: BASOPHILIC STIPPLING    ABGS No results found for this basename: PHART, PCO2, PO2ART, TCO2, HCO3,  in the last 72 hours CULTURES No results found for this or any previous visit (from the past 240 hour(s)). Studies/Results: No results found.  Medications:  Prior to Admission:  Prescriptions prior to admission  Medication Sig Dispense Refill  . allopurinol (ZYLOPRIM) 300 MG tablet Take 300 mg by mouth every morning.       . clopidogrel (PLAVIX) 75 MG tablet Take 75 mg by mouth every morning.        . donepezil (ARICEPT) 10 MG tablet Take 10 mg by mouth at bedtime.       . famotidine (PEPCID) 20 MG tablet TAKE ONE TABLET BY MOUTH TWICE DAILY.  60 tablet  5  . furosemide (LASIX) 40 MG tablet Take 20 mg by mouth daily.       . isosorbide mononitrate (IMDUR) 30 MG 24 hr tablet Take 0.5 tablets (15 mg total) by mouth daily.  30 tablet  11  . Linaclotide (LINZESS) 145 MCG CAPS capsule Take 1 capsule (145 mcg total) by mouth daily.  30 capsule  5  . metoprolol tartrate (LOPRESSOR) 25 MG tablet Take 0.5 tablets (12.5 mg total) by mouth 2 (two) times daily.  60 tablet  3  . nitroGLYCERIN (NITROSTAT) 0.4 MG SL tablet Place 1 tablet (0.4 mg total) under the tongue every 5 (five) minutes as needed for chest pain. 1 tablet under tongue at onset of chest pain;you may repeat every 5 minutes for up to 3 doses.  25 tablet  3  . Oxcarbazepine (TRILEPTAL) 300 MG tablet Take 300 mg by mouth 2 (two) times daily.       Marland Kitchen oxybutynin (DITROPAN) 5 MG tablet Take 5 mg by mouth 2 (two) times daily.       . pravastatin (PRAVACHOL) 40 MG tablet Take 40 mg by mouth daily. Daily at bedtime      . Tamsulosin HCl (FLOMAX) 0.4 MG CAPS Take 0.4 mg by mouth at bedtime.       Marland Kitchen tiotropium (SPIRIVA HANDIHALER) 18 MCG inhalation capsule Place 1 capsule (18 mcg total) into inhaler and inhale daily.  30 capsule  6  . VITAMIN B1-B12 IM Inject into the muscle every 30 (thirty) days. Once a month       Scheduled: . sodium chloride   Intravenous Once  . albuterol  2.5 mg Nebulization QID  . allopurinol  300 mg Oral q morning - 10a  . aspirin  81 mg Oral Daily  . clopidogrel  75 mg Oral q morning - 10a  . darbepoetin  60 mcg Subcutaneous Q7 days  . donepezil  10 mg Oral QHS  . famotidine  20 mg Oral Daily  . isosorbide mononitrate  15 mg Oral Daily  . levofloxacin (LEVAQUIN) IV  500 mg Intravenous Q48H  . Linaclotide  145 mcg Oral Daily  . methylPREDNISolone (SOLU-MEDROL)  injection  40 mg Intravenous Q6H  . metoprolol  tartrate  12.5 mg Oral BID  . Oxcarbazepine  300 mg Oral BID  . oxybutynin  5 mg Oral BID  . polyethylene glycol  17 g Oral Daily  . simvastatin  20 mg Oral q1800  . sodium chloride  3 mL Intravenous Q12H  . tamsulosin  0.4 mg Oral QHS  . tiotropium  18 mcg Inhalation Daily  . torsemide  40 mg Oral Daily   Continuous:  [id number], acetaminophen, albuterol, alum & mag hydroxide-simeth, docusate sodium, nitroGLYCERIN, ondansetron (ZOFRAN) IV, ondansetron  Assesment: He was admitted with a stroke. He has acute on chronic renal failure. He has hemoptysis likely from pulmonary hemorrhage. I don't think is a good candidate for bronchoscopy he did require a blood transfusion. His renal function has not improved very much. Principal Problem:   Acute lacunar infarction Active Problems:   Acute kidney injury   Anemia   Hemoptysis    Plan: Continue current treatments. He is on antibiotics steroids inhaled bronchodilators Lasix and IV fluids    LOS: 6 days   Holly Iannaccone L 10/24/2013, 10:46 AM

## 2013-10-24 NOTE — Progress Notes (Signed)
PHARMACIST - PHYSICIAN COMMUNICATION DR:   Willey Blade CONCERNING: Antibiotic IV to Oral Route Change Policy  RECOMMENDATION: This patient is receiving Levaquin by the intravenous route.  Based on criteria approved by the Pharmacy and Therapeutics Committee, the antibiotic(s) is/are being converted to the equivalent oral dose form(s).   DESCRIPTION: These criteria include:  Patient being treated for a respiratory tract infection, urinary tract infection, cellulitis or clostridium difficile associated diarrhea if on metronidazole  The patient is not neutropenic and does not exhibit a GI malabsorption state  The patient is eating (either orally or via tube) and/or has been taking other orally administered medications for a least 24 hours  The patient is improving clinically and has a Tmax < 100.5  If you have questions about this conversion, please contact the Pharmacy Department  [x]   [phone] )  [facility] []   [phone] )  [facility]  []   [phone] )  [facility] []   [phone] )  [place], PharmD, BCPS 10/24/2013@10 :48 AM

## 2013-10-25 LAB — BASIC METABOLIC PANEL
ANION GAP: 19 — AB (ref 5–15)
BUN: 81 mg/dL — ABNORMAL HIGH (ref 6–23)
CALCIUM: 8.8 mg/dL (ref 8.4–10.5)
CO2: 19 meq/L (ref 19–32)
Chloride: 96 mEq/L (ref 96–112)
Creatinine, Ser: 4.31 mg/dL — ABNORMAL HIGH (ref 0.50–1.35)
GFR calc Af Amer: 13 mL/min — ABNORMAL LOW (ref >90)
GFR calc non Af Amer: 11 mL/min — ABNORMAL LOW (ref >90)
Glucose, Bld: 225 mg/dL — ABNORMAL HIGH (ref 70–99)
Potassium: 4.6 mEq/L (ref 3.7–5.3)
Sodium: 134 mEq/L — ABNORMAL LOW (ref 137–147)

## 2013-10-25 LAB — CBC WITH DIFFERENTIAL/PLATELET
BASOS ABS: 0 10*3/uL (ref 0.0–0.1)
Basophils Relative: 0 % (ref 0–1)
Eosinophils Absolute: 0 10*3/uL (ref 0.0–0.7)
Eosinophils Relative: 0 % (ref 0–5)
HEMATOCRIT: 26 % — AB (ref 39.0–52.0)
Hemoglobin: 9.3 g/dL — ABNORMAL LOW (ref 13.0–17.0)
Lymphocytes Relative: 4 % — ABNORMAL LOW (ref 12–46)
Lymphs Abs: 0.4 10*3/uL — ABNORMAL LOW (ref 0.7–4.0)
MCH: 33.8 pg (ref 26.0–34.0)
MCHC: 35.8 g/dL (ref 30.0–36.0)
MCV: 94.5 fL (ref 78.0–100.0)
Monocytes Absolute: 0.7 10*3/uL (ref 0.1–1.0)
Monocytes Relative: 7 % (ref 3–12)
NEUTROS ABS: 9.3 10*3/uL — AB (ref 1.7–7.7)
Neutrophils Relative %: 90 % — ABNORMAL HIGH (ref 43–77)
Platelets: 102 10*3/uL — ABNORMAL LOW (ref 150–400)
RBC: 2.75 MIL/uL — ABNORMAL LOW (ref 4.22–5.81)
RDW: 17.1 % — AB (ref 11.5–15.5)
WBC: 10.4 10*3/uL (ref 4.0–10.5)

## 2013-10-25 LAB — GLUCOSE, CAPILLARY
GLUCOSE-CAPILLARY: 226 mg/dL — AB (ref 70–99)
Glucose-Capillary: 259 mg/dL — ABNORMAL HIGH (ref 70–99)
Glucose-Capillary: 150 mg/dL — ABNORMAL HIGH (ref 70–99)
Glucose-Capillary: 242 mg/dL — ABNORMAL HIGH (ref 70–99)

## 2013-10-25 LAB — HEMOGLOBIN A1C
HEMOGLOBIN A1C: 6.3 % — AB (ref <5.7)
MEAN PLASMA GLUCOSE: 134 mg/dL — AB (ref <117)

## 2013-10-25 MED ORDER — INSULIN ASPART 100 UNIT/ML ~~LOC~~ SOLN
0.0000 [IU] | Freq: Three times a day (TID) | SUBCUTANEOUS | Status: DC
Start: 2013-10-25 — End: 2013-10-28
  Administered 2013-10-25: 5 [IU] via SUBCUTANEOUS
  Administered 2013-10-26: 2 [IU] via SUBCUTANEOUS
  Administered 2013-10-26: 5 [IU] via SUBCUTANEOUS
  Administered 2013-10-26: 3 [IU] via SUBCUTANEOUS
  Administered 2013-10-27: 2 [IU] via SUBCUTANEOUS
  Administered 2013-10-27: 5 [IU] via SUBCUTANEOUS

## 2013-10-25 MED ORDER — INSULIN ASPART 100 UNIT/ML ~~LOC~~ SOLN
0.0000 [IU] | Freq: Every day | SUBCUTANEOUS | Status: DC
  Administered 2013-10-27: 3 [IU] via SUBCUTANEOUS

## 2013-10-25 MED ORDER — METHYLPREDNISOLONE SODIUM SUCC 40 MG IJ SOLR
40.0000 mg | Freq: Two times a day (BID) | INTRAMUSCULAR | Status: DC
  Administered 2013-10-25 – 2013-10-27 (×4): 40 mg via INTRAVENOUS
  Filled 2013-10-25 (×4): qty 1

## 2013-10-25 NOTE — Progress Notes (Signed)
[name]  MRN: [id number]  DOB/AGE: 78/22/1923 78 y.o.  Primary Care Physician:FAGAN,ROY, MD  Admit date: 10/18/2013  Chief Complaint:  Chief Complaint  Patient presents with  . Aphasia    S-Pt presented on  10/18/2013 with  Chief Complaint  Patient presents with  . Aphasia  .    Pt today feels better  Meds . sodium chloride   Intravenous Once  . albuterol  2.5 mg Nebulization QID  . allopurinol  300 mg Oral q morning - 10a  . aspirin  81 mg Oral Daily  . clopidogrel  75 mg Oral q morning - 10a  . darbepoetin  60 mcg Subcutaneous Q7 days  . donepezil  10 mg Oral QHS  . famotidine  20 mg Oral Daily  . insulin aspart  0-5 Units Subcutaneous QHS  . insulin aspart  0-9 Units Subcutaneous TID WC  . isosorbide mononitrate  15 mg Oral Daily  . [START ON 10/26/2013] levofloxacin  500 mg Oral Q48H  . Linaclotide  145 mcg Oral Daily  . methylPREDNISolone (SOLU-MEDROL) injection  40 mg Intravenous Q12H  . metoprolol tartrate  12.5 mg Oral BID  . Oxcarbazepine  300 mg Oral BID  . oxybutynin  5 mg Oral BID  . polyethylene glycol  17 g Oral Daily  . simvastatin  20 mg Oral q1800  . sodium chloride  3 mL Intravenous Q12H  . tamsulosin  0.4 mg Oral QHS  . tiotropium  18 mcg Inhalation Daily  . torsemide  40 mg Oral Daily     Physical Exam: Vital signs in last 24 hours: Temp:  [97.2 F (36.2 C)-98 F (36.7 C)] 97.2 F (36.2 C) (10/11 0523) Pulse Rate:  [85-91] 85 (10/11 0523) Resp:  [20] 20 (10/11 0523) BP: (110-129)/(54-70) 129/65 mmHg (10/11 0523) SpO2:  [91 %-100 %] 94 % (10/11 0753) Weight change:  Last BM Date: 10/24/13  Intake/Output from previous day: 10/10 0701 - 10/11 0700 In: 366 [IV Piggyback:366] Out: [place] [Urine:1075]     Physical Exam: General- pt is awake,alert, oriented to time place and person Resp- No acute REsp distress, CTA B/L NO Rhonchi CVS- S1S2 regular in rate and rhythm GIT- BS+, soft, NT, ND EXT- NO LE Edema, Cyanosis   Lab  Results: CBC  Recent Labs  10/24/13 0552 10/25/13 0630  WBC 10.1 10.4  HGB 8.8* 9.3*  HCT 25.5* 26.0*  PLT 100* 102*    BMET  Recent Labs  10/24/13 0552 10/25/13 0630  NA 137 134*  K 4.6 4.6  CL 100 96  CO2 20 19  GLUCOSE 188* 225*  BUN 76* 81*  CREATININE 4.43* 4.31*  CALCIUM 9.0 8.8   Trend Creat 2015  5.29=> 4.57=>4.31           2.1--2.8 ( Baseline) 2014  1.4--1.7 2013  1.3--1.4 2012   1.3 2011   1.6--1.7 2010    1.6--1.9 2009   1,6    Lab Results  Component Value Date   CALCIUM 8.8 10/25/2013   PHOS 4.4 10/20/2013          Impression: 1)Renal  AKI secondary to Prerenal/ATN/Interstitial                 AKI improving slowly               CKD stage 4 .               CKD since 2009  CKD secondary to HTN/Age associated decline                Progression of CKD now marked with AKI                Proteinura Absent .                Autoimmune work up Negative                 ANA/ANCA -negative                  Complements not low  2)HTN Medication- On Diuretics- On Beta blockers    3)Anemia HGb  at goal (9--11) Received [facility] Primary team folowing   4)CKD Mineral-Bone Disorder Phosphorus at goal. Calcium at goal.  5)CNS-Admitted with TIA Neurology and Primary MD following  6)Electrolytes Normokalemic Hyponatremic   7)Acid base Co2 at goal     Plan:   Will continue current care     [place] S 10/25/2013, 8:25 AM

## 2013-10-25 NOTE — Progress Notes (Signed)
Subjective: He says he feels better. He has no new complaints. He is having less hemoptysis and his breathing has improved  Objective: Vital signs in last 24 hours: Temp:  [97.2 F (36.2 C)-98 F (36.7 C)] 97.2 F (36.2 C) (10/11 0523) Pulse Rate:  [85-91] 85 (10/11 0523) Resp:  [20] 20 (10/11 0523) BP: (110-129)/(54-70) 129/65 mmHg (10/11 0523) SpO2:  [91 %-100 %] 94 % (10/11 0753) Weight change:  Last BM Date: 10/24/13  Intake/Output from previous day: 10/10 0701 - 10/11 0700 In: 366 [IV Piggyback:366] Out: 1075 [Urine:1075]  PHYSICAL EXAM General appearance: alert, cooperative and mild distress Resp: Rhonchi bilaterally Cardio: regular rate and rhythm, S1, S2 normal, no murmur, click, rub or gallop GI: soft, non-tender; bowel sounds normal; no masses,  no organomegaly Extremities: extremities normal, atraumatic, no cyanosis or edema  Lab Results:  Results for orders placed during the hospital encounter of 10/18/13 (from the past 48 hour(s))  SAMPLE TO BLOOD BANK     Status: None   Collection Time    10/23/13  9:41 AM      Result Value Ref Range   Blood Bank Specimen SAMPLE AVAILABLE FOR TESTING     Sample Expiration 10/26/2013    TYPE AND SCREEN     Status: None   Collection Time    10/23/13  9:43 AM      Result Value Ref Range   ABO/RH(D) A POS     Antibody Screen NEG     Sample Expiration 10/26/2013     Unit Number [id number]     Blood Component Type RED CELLS,LR     Unit division 00     Status of Unit ISSUED,FINAL     Transfusion Status OK TO TRANSFUSE     Crossmatch Result Compatible     Unit Number [id number]     Blood Component Type RED CELLS,LR     Unit division 00     Status of Unit ISSUED,FINAL     Transfusion Status OK TO TRANSFUSE     Crossmatch Result Compatible    ABO/RH     Status: None   Collection Time    10/23/13  9:43 AM      Result Value Ref Range   ABO/RH(D) A POS    PREPARE RBC (CROSSMATCH)     Status: None   Collection Time     10/23/13  9:43 AM      Result Value Ref Range   Order Confirmation ORDER PROCESSED BY BLOOD BANK    HEMOGLOBIN AND HEMATOCRIT, BLOOD     Status: Abnormal   Collection Time    10/23/13 11:17 PM      Result Value Ref Range   Hemoglobin 8.9 (*) 13.0 - 17.0 g/dL   Comment: POST TRANSFUSION SPECIMEN   HCT 25.3 (*) 39.0 - 39.7 %  BASIC METABOLIC PANEL     Status: Abnormal   Collection Time    10/24/13  5:52 AM      Result Value Ref Range   Sodium 137  137 - 147 mEq/L   Potassium 4.6  3.7 - 5.3 mEq/L   Chloride 100  96 - 112 mEq/L   CO2 20  19 - 32 mEq/L   Glucose, Bld 188 (*) 70 - 99 mg/dL   BUN 76 (*) 6 - 23 mg/dL   Creatinine, Ser 4.43 (*) 0.50 - 1.35 mg/dL   Calcium 9.0  8.4 - 10.5 mg/dL   GFR calc non Af Amer 11 (*) >90  mL/min   GFR calc Af Amer 12 (*) >90 mL/min   Comment: (NOTE)     The eGFR has been calculated using the CKD EPI equation.     This calculation has not been validated in all clinical situations.     eGFR's persistently <90 mL/min signify possible Chronic Kidney     Disease.   Anion gap 17 (*) 5 - 15  CBC WITH DIFFERENTIAL     Status: Abnormal   Collection Time    10/24/13  5:52 AM      Result Value Ref Range   WBC 10.1  4.0 - 10.5 K/uL   Comment: WHITE COUNT CONFIRMED ON SMEAR   RBC 2.64 (*) 4.22 - 5.81 MIL/uL   Hemoglobin 8.8 (*) 13.0 - 17.0 g/dL   HCT 25.5 (*) 39.0 - 52.0 %   MCV 96.6  78.0 - 100.0 fL   MCH 33.3  26.0 - 34.0 pg   MCHC 34.5  30.0 - 36.0 g/dL   RDW 15.6 (*) 11.5 - 15.5 %   Platelets 100 (*) 150 - 400 K/uL   Neutrophils Relative % 90 (*) 43 - 77 %   Neutro Abs 9.0 (*) 1.7 - 7.7 K/uL   Lymphocytes Relative 5 (*) 12 - 46 %   Lymphs Abs 0.5 (*) 0.7 - 4.0 K/uL   Monocytes Relative 5  3 - 12 %   Monocytes Absolute 0.5  0.1 - 1.0 K/uL   Eosinophils Relative 0  0 - 5 %   Eosinophils Absolute 0.0  0.0 - 0.7 K/uL   Basophils Relative 0  0 - 1 %   Basophils Absolute 0.0  0.0 - 0.1 K/uL   WBC Morphology SPECIMEN CHECKED FOR CLOTS      Comment: PLATELET COUNT CONFIRMED BY SMEAR   RBC Morphology POLYCHROMASIA PRESENT     Comment: BASOPHILIC STIPPLING  BASIC METABOLIC PANEL     Status: Abnormal   Collection Time    10/25/13  6:30 AM      Result Value Ref Range   Sodium 134 (*) 137 - 147 mEq/L   Potassium 4.6  3.7 - 5.3 mEq/L   Chloride 96  96 - 112 mEq/L   CO2 19  19 - 32 mEq/L   Glucose, Bld 225 (*) 70 - 99 mg/dL   BUN 81 (*) 6 - 23 mg/dL   Creatinine, Ser 4.31 (*) 0.50 - 1.35 mg/dL   Calcium 8.8  8.4 - 10.5 mg/dL   GFR calc non Af Amer 11 (*) >90 mL/min   GFR calc Af Amer 13 (*) >90 mL/min   Comment: (NOTE)     The eGFR has been calculated using the CKD EPI equation.     This calculation has not been validated in all clinical situations.     eGFR's persistently <90 mL/min signify possible Chronic Kidney     Disease.   Anion gap 19 (*) 5 - 15  CBC WITH DIFFERENTIAL     Status: Abnormal   Collection Time    10/25/13  6:30 AM      Result Value Ref Range   WBC 10.4  4.0 - 10.5 K/uL   RBC 2.75 (*) 4.22 - 5.81 MIL/uL   Hemoglobin 9.3 (*) 13.0 - 17.0 g/dL   HCT 26.0 (*) 39.0 - 52.0 %   MCV 94.5  78.0 - 100.0 fL   MCH 33.8  26.0 - 34.0 pg   MCHC 35.8  30.0 - 36.0 g/dL   RDW  17.1 (*) 11.5 - 15.5 %   Platelets 102 (*) 150 - 400 K/uL   Comment: SPECIMEN CHECKED FOR CLOTS     CONSISTENT WITH PREVIOUS RESULT   Neutrophils Relative % 90 (*) 43 - 77 %   Neutro Abs 9.3 (*) 1.7 - 7.7 K/uL   Lymphocytes Relative 4 (*) 12 - 46 %   Lymphs Abs 0.4 (*) 0.7 - 4.0 K/uL   Monocytes Relative 7  3 - 12 %   Monocytes Absolute 0.7  0.1 - 1.0 K/uL   Eosinophils Relative 0  0 - 5 %   Eosinophils Absolute 0.0  0.0 - 0.7 K/uL   Basophils Relative 0  0 - 1 %   Basophils Absolute 0.0  0.0 - 0.1 K/uL    ABGS No results found for this basename: PHART, PCO2, PO2ART, TCO2, HCO3,  in the last 72 hours CULTURES No results found for this or any previous visit (from the past 240 hour(s)). Studies/Results: No results  found.  Medications:  Prior to Admission:  Prescriptions prior to admission  Medication Sig Dispense Refill  . allopurinol (ZYLOPRIM) 300 MG tablet Take 300 mg by mouth every morning.       . clopidogrel (PLAVIX) 75 MG tablet Take 75 mg by mouth every morning.       . donepezil (ARICEPT) 10 MG tablet Take 10 mg by mouth at bedtime.       . famotidine (PEPCID) 20 MG tablet TAKE ONE TABLET BY MOUTH TWICE DAILY.  60 tablet  5  . furosemide (LASIX) 40 MG tablet Take 20 mg by mouth daily.       . isosorbide mononitrate (IMDUR) 30 MG 24 hr tablet Take 0.5 tablets (15 mg total) by mouth daily.  30 tablet  11  . Linaclotide (LINZESS) 145 MCG CAPS capsule Take 1 capsule (145 mcg total) by mouth daily.  30 capsule  5  . metoprolol tartrate (LOPRESSOR) 25 MG tablet Take 0.5 tablets (12.5 mg total) by mouth 2 (two) times daily.  60 tablet  3  . nitroGLYCERIN (NITROSTAT) 0.4 MG SL tablet Place 1 tablet (0.4 mg total) under the tongue every 5 (five) minutes as needed for chest pain. 1 tablet under tongue at onset of chest pain;you may repeat every 5 minutes for up to 3 doses.  25 tablet  3  . Oxcarbazepine (TRILEPTAL) 300 MG tablet Take 300 mg by mouth 2 (two) times daily.       Marland Kitchen oxybutynin (DITROPAN) 5 MG tablet Take 5 mg by mouth 2 (two) times daily.       . pravastatin (PRAVACHOL) 40 MG tablet Take 40 mg by mouth daily. Daily at bedtime      . Tamsulosin HCl (FLOMAX) 0.4 MG CAPS Take 0.4 mg by mouth at bedtime.       Marland Kitchen tiotropium (SPIRIVA HANDIHALER) 18 MCG inhalation capsule Place 1 capsule (18 mcg total) into inhaler and inhale daily.  30 capsule  6  . VITAMIN B1-B12 IM Inject into the muscle every 30 (thirty) days. Once a month       Scheduled: . sodium chloride   Intravenous Once  . albuterol  2.5 mg Nebulization QID  . allopurinol  300 mg Oral q morning - 10a  . aspirin  81 mg Oral Daily  . clopidogrel  75 mg Oral q morning - 10a  . darbepoetin  60 mcg Subcutaneous Q7 days  . donepezil  10 mg  Oral QHS  . famotidine  20 mg Oral Daily  . insulin aspart  0-5 Units Subcutaneous QHS  . insulin aspart  0-9 Units Subcutaneous TID WC  . isosorbide mononitrate  15 mg Oral Daily  . [START ON 10/26/2013] levofloxacin  500 mg Oral Q48H  . Linaclotide  145 mcg Oral Daily  . methylPREDNISolone (SOLU-MEDROL) injection  40 mg Intravenous Q12H  . metoprolol tartrate  12.5 mg Oral BID  . Oxcarbazepine  300 mg Oral BID  . oxybutynin  5 mg Oral BID  . polyethylene glycol  17 g Oral Daily  . simvastatin  20 mg Oral q1800  . sodium chloride  3 mL Intravenous Q12H  . tamsulosin  0.4 mg Oral QHS  . tiotropium  18 mcg Inhalation Daily  . torsemide  40 mg Oral Daily   Continuous:  CHE:NIDPOEUMPNTIR, acetaminophen, albuterol, alum & mag hydroxide-simeth, docusate sodium, nitroGLYCERIN, ondansetron (ZOFRAN) IV, ondansetron  Assesment: He was admitted with an acute stroke. He seems to be improving as far as that's concerned. He has no neurological symptoms except for some mild dizziness. He has had acute on chronic renal failure and his renal function is a little bit better today. He has chronic hemoptysis and that has improved. He was anemic which was a combination of anemia of chronic disease and his hemoptysis and received 2 units of packed red blood cells which seems to have improved his situation Principal Problem:   Acute lacunar infarction Active Problems:   Acute kidney injury   Anemia   Hemoptysis    Plan: No change in treatment. His blood sugar was up some so I did add sliding scale insulin and will reduce his steroids    LOS: 7 days   Enora Trillo L 10/25/2013, 8:28 AM

## 2013-10-26 LAB — GLUCOSE, CAPILLARY
GLUCOSE-CAPILLARY: 327 mg/dL — AB (ref 70–99)
GLUCOSE-CAPILLARY: 163 mg/dL — AB (ref 70–99)
Glucose-Capillary: 195 mg/dL — ABNORMAL HIGH (ref 70–99)
Glucose-Capillary: 257 mg/dL — ABNORMAL HIGH (ref 70–99)

## 2013-10-26 NOTE — Progress Notes (Signed)
Subjective: [name] was originally admitted with probable TIA. CT revealed old stroke changes but no acute abnormality. He cannot have an MRI because of his pacemaker. He has had continued hemoptysis and has an abnormal chest x-ray with bilateral infiltrates. He has been seen by pulmonology and has been treated with levofloxacin. He remains on supplemental oxygen. He states his cough is better and that he feels better in general. He has had acute on chronic renal failure. His creatinine has increased from approximately 1.5 earlier this year to approximately 4.5. Much of this increase has occurred after diuretic therapy was required for congestive heart failure. He has been seen in consultation by nephrology. Is not using NSAIDs.  Objective: Vital signs in last 24 hours: Filed Vitals:   10/25/13 1619 10/25/13 2018 10/25/13 2207 10/26/13 0605  BP:   115/54 128/55  Pulse:   95 83  Temp:   97.5 F (36.4 C) 98.6 F (37 C)  TempSrc:   Oral Oral  Resp:   20 20  Height:      Weight:      SpO2: 92% 91% 96% 97%   Weight change:   Intake/Output Summary (Last 24 hours) at 10/26/13 0710 Last data filed at 10/26/13 0605  Gross per 24 hour  Intake    600 ml  Output   1050 ml  Net   -450 ml    Physical Exam: Alert and in no distress. Speech intact. Lungs reveal bilateral crackles. Heart regular. Abdomen soft and nontender. Extremities reveal no edema. No focal weakness in the extremities noted.  Lab Results:    Results for orders placed during the hospital encounter of 10/18/13 (from the past 24 hour(s))  GLUCOSE, CAPILLARY     Status: Abnormal   Collection Time    10/25/13  8:27 AM      Result Value Ref Range   Glucose-Capillary 226 (*) 70 - 99 mg/dL   Comment 1 Notify RN    HEMOGLOBIN A1C     Status: Abnormal   Collection Time    10/25/13  8:34 AM      Result Value Ref Range   Hemoglobin A1C 6.3 (*) <5.7 %   Mean Plasma Glucose 134 (*) <117 mg/dL  GLUCOSE, CAPILLARY     Status:  Abnormal   Collection Time    10/25/13 12:05 PM      Result Value Ref Range   Glucose-Capillary 259 (*) 70 - 99 mg/dL   Comment 1 Notify RN    GLUCOSE, CAPILLARY     Status: Abnormal   Collection Time    10/25/13  5:30 PM      Result Value Ref Range   Glucose-Capillary 150 (*) 70 - 99 mg/dL   Comment 1 Notify RN    GLUCOSE, CAPILLARY     Status: Abnormal   Collection Time    10/25/13 10:11 PM      Result Value Ref Range   Glucose-Capillary 242 (*) 70 - 99 mg/dL   Comment 1 Documented in Chart     Comment 2 Notify RN       ABGS No results found for this basename: PHART, PCO2, PO2ART, TCO2, HCO3,  in the last 72 hours CULTURES No results found for this or any previous visit (from the past 240 hour(s)). Studies/Results: No results found. Micro Results: No results found for this or any previous visit (from the past 240 hour(s)). Studies/Results: No results found. Medications:  I have reviewed the patient's current medications Scheduled  Meds: . sodium chloride   Intravenous Once  . albuterol  2.5 mg Nebulization QID  . allopurinol  300 mg Oral q morning - 10a  . aspirin  81 mg Oral Daily  . clopidogrel  75 mg Oral q morning - 10a  . darbepoetin  60 mcg Subcutaneous Q7 days  . donepezil  10 mg Oral QHS  . famotidine  20 mg Oral Daily  . insulin aspart  0-5 Units Subcutaneous QHS  . insulin aspart  0-9 Units Subcutaneous TID WC  . isosorbide mononitrate  15 mg Oral Daily  . levofloxacin  500 mg Oral Q48H  . Linaclotide  145 mcg Oral Daily  . methylPREDNISolone (SOLU-MEDROL) injection  40 mg Intravenous Q12H  . metoprolol tartrate  12.5 mg Oral BID  . Oxcarbazepine  300 mg Oral BID  . oxybutynin  5 mg Oral BID  . polyethylene glycol  17 g Oral Daily  . simvastatin  20 mg Oral q1800  . sodium chloride  3 mL Intravenous Q12H  . tamsulosin  0.4 mg Oral QHS  . tiotropium  18 mcg Inhalation Daily  . torsemide  40 mg Oral Daily   Continuous Infusions:  PRN  Meds:.acetaminophen, acetaminophen, albuterol, alum & mag hydroxide-simeth, docusate sodium, nitroGLYCERIN, ondansetron (ZOFRAN) IV, ondansetron   Assessment/Plan: #1. Hemoptysis. Improved. Will discuss further with pulmonology. #2. TIA versus recurrent stroke. Continue antiplatelet therapy. #3. Acute on chronic renal failure. Appreciate nephrology input. #4. Diabetes. Stable. Hemoglobin A1c is 6.3. #5. Status post pacemaker placement. #6. Anemia. Status post 2 unit transfusion this hospitalization. Principal Problem:   Acute lacunar infarction Active Problems:   Acute kidney injury   Anemia   Hemoptysis     LOS: 8 days   [name] 10/26/2013, 7:10 AM

## 2013-10-26 NOTE — Clinical Social Work Note (Signed)
Per [facility], no beds available. Discussed with pt and pt's daughter-in-law Curt Bears. Both agree to look at [facility] or [facility]. CSW will send referral to facilities and follow up.  Benay Pike, [place]

## 2013-10-26 NOTE — Progress Notes (Signed)
Physical Therapy Treatment Patient Details Name: [name] MRN: [id number] DOB: 11/02/1921 Today's Date: 10/26/2013    History of Present Illness With a history of 3 previous strokes, CT DX stage III, COPD and called stage A., coronary artery disease, hypertension, hyperlipidemia, emphysema, stable abdominal aneurysm, GERD. He presents to the hospital after having right arm pain with difficulty moving his right arm that was accompanied by slurred speech. These symptoms lasted about an hour. He went to bed last night around 9 or 10 PM and slept until 9 this morning. His son called him and spoke to him and, though he sounded groggy, his speech was normal. When his son called him back at noon, and he had slurred speech and difficulty word finding. Additionally, he dropped the phone several times. EMS was called and by the time he arrived to the hospital his symptoms had resolved.    PT Comments    Pt is alert and oriented, very cooperative.  He is currently on 5 L O2/min. With no c/o.  He was able to tolerate therapeutic bed exercise as outlined. 10 repetitions each.  He transfers well out of bed and was able to ambulate 24' with a walker and close guarding.  His O2 sat after gait =87% but quickly rebounded to 96%.  He felt well and is highly motivated to get home.  Follow Up Recommendations  SNF     Equipment Recommendations  None recommended by PT    Recommendations for Other Services  none     Precautions / Restrictions Precautions Precautions: Fall Restrictions Weight Bearing Restrictions: No    Mobility  Bed Mobility Overal bed mobility: Modified Independent             General bed mobility comments: HOB is elevated  Transfers Overall transfer level: Needs assistance Equipment used: Rolling walker (2 wheeled) Transfers: Sit to/from Stand Sit to Stand: Supervision;Min guard         General transfer comment: close guarding. and cues for proper hand  placement  Ambulation/Gait Ambulation/Gait assistance: Min guard Ambulation Distance (Feet): 24 Feet Assistive device: Rolling walker (2 wheeled) Gait Pattern/deviations: WFL(Within Functional Limits) Gait velocity: velocity is appropriate for pt's medical status       Stairs            Wheelchair Mobility    Modified Rankin (Stroke Patients Only)       Balance     Sitting balance-Leahy Scale: Good     Standing balance support: Bilateral upper extremity supported Standing balance-Leahy Scale: Fair                      Cognition Arousal/Alertness: Awake/alert Behavior During Therapy: WFL for tasks assessed/performed Overall Cognitive Status: Within Functional Limits for tasks assessed                      Exercises General Exercises - Lower Extremity Ankle Circles/Pumps: AROM;Both;10 reps;Supine Quad Sets: AROM;Both;10 reps;Supine Gluteal Sets: AROM;Both;10 reps;Supine Short Arc Quad: AROM;Both;10 reps;Supine Heel Slides: AROM;Both;10 reps;Supine    General Comments        Pertinent Vitals/Pain Pain Assessment: No/denies pain    Home Living                      Prior Function            PT Goals (current goals can now be found in the care plan section) Progress towards PT goals: Progressing toward  goals    Frequency       PT Plan Current plan remains appropriate    Co-evaluation             End of Session Equipment Utilized During Treatment: Gait belt Activity Tolerance: Patient tolerated treatment well Patient left: in chair;with call bell/phone within reach;with chair alarm set;with nursing/sitter in room     Time: 1313-1349 PT Time Calculation (min): 36 min  Charges:  $Gait Training: 8-22 mins $Therapeutic Exercise: 8-22 mins                    G Codes:      Sable Feil 10/27/2013, 1:54 PM

## 2013-10-26 NOTE — Progress Notes (Signed)
Subjective: He says he feels better. He has been feeling better for the last 2 or 3 days. His hemoptysis is somewhat improved.  Objective: Vital signs in last 24 hours: Temp:  [97.5 F (36.4 C)-98.6 F (37 C)] 98.6 F (37 C) (10/12 0605) Pulse Rate:  [80-95] 83 (10/12 0605) Resp:  [20] 20 (10/12 0605) BP: (105-128)/(54-55) 128/55 mmHg (10/12 0605) SpO2:  [91 %-98 %] 92 % (10/12 0713) Weight change:  Last BM Date: 10/24/13  Intake/Output from previous day: 10/11 0701 - 10/12 0700 In: 600 [P.O.:600] Out: 1050 [Urine:1050]  PHYSICAL EXAM General appearance: alert, cooperative and mild distress Resp: rhonchi bilaterally Cardio: regular rate and rhythm, S1, S2 normal, no murmur, click, rub or gallop GI: soft, non-tender; bowel sounds normal; no masses,  no organomegaly Extremities: extremities normal, atraumatic, no cyanosis or edema  Lab Results:  Results for orders placed during the hospital encounter of 10/18/13 (from the past 48 hour(s))  BASIC METABOLIC PANEL     Status: Abnormal   Collection Time    10/25/13  6:30 AM      Result Value Ref Range   Sodium 134 (*) 137 - 147 mEq/L   Potassium 4.6  3.7 - 5.3 mEq/L   Chloride 96  96 - 112 mEq/L   CO2 19  19 - 32 mEq/L   Glucose, Bld 225 (*) 70 - 99 mg/dL   BUN 81 (*) 6 - 23 mg/dL   Creatinine, Ser 4.31 (*) 0.50 - 1.35 mg/dL   Calcium 8.8  8.4 - 10.5 mg/dL   GFR calc non Af Amer 11 (*) >90 mL/min   GFR calc Af Amer 13 (*) >90 mL/min   Comment: (NOTE)     The eGFR has been calculated using the CKD EPI equation.     This calculation has not been validated in all clinical situations.     eGFR's persistently <90 mL/min signify possible Chronic Kidney     Disease.   Anion gap 19 (*) 5 - 15  CBC WITH DIFFERENTIAL     Status: Abnormal   Collection Time    10/25/13  6:30 AM      Result Value Ref Range   WBC 10.4  4.0 - 10.5 K/uL   RBC 2.75 (*) 4.22 - 5.81 MIL/uL   Hemoglobin 9.3 (*) 13.0 - 17.0 g/dL   HCT 26.0 (*) 39.0 -  52.0 %   MCV 94.5  78.0 - 100.0 fL   MCH 33.8  26.0 - 34.0 pg   MCHC 35.8  30.0 - 36.0 g/dL   RDW 17.1 (*) 11.5 - 15.5 %   Platelets 102 (*) 150 - 400 K/uL   Comment: SPECIMEN CHECKED FOR CLOTS     CONSISTENT WITH PREVIOUS RESULT   Neutrophils Relative % 90 (*) 43 - 77 %   Neutro Abs 9.3 (*) 1.7 - 7.7 K/uL   Lymphocytes Relative 4 (*) 12 - 46 %   Lymphs Abs 0.4 (*) 0.7 - 4.0 K/uL   Monocytes Relative 7  3 - 12 %   Monocytes Absolute 0.7  0.1 - 1.0 K/uL   Eosinophils Relative 0  0 - 5 %   Eosinophils Absolute 0.0  0.0 - 0.7 K/uL   Basophils Relative 0  0 - 1 %   Basophils Absolute 0.0  0.0 - 0.1 K/uL  GLUCOSE, CAPILLARY     Status: Abnormal   Collection Time    10/25/13  8:27 AM      Result Value  Ref Range   Glucose-Capillary 226 (*) 70 - 99 mg/dL   Comment 1 Notify RN    HEMOGLOBIN A1C     Status: Abnormal   Collection Time    10/25/13  8:34 AM      Result Value Ref Range   Hemoglobin A1C 6.3 (*) <5.7 %   Comment: (NOTE)                                                                               According to the ADA Clinical Practice Recommendations for 2011, when     HbA1c is used as a screening test:      >=6.5%   Diagnostic of Diabetes Mellitus               (if abnormal result is confirmed)     5.7-6.4%   Increased risk of developing Diabetes Mellitus     References:Diagnosis and Classification of Diabetes Mellitus,Diabetes     QZRA,0762,26(JFHLK 1):S62-S69 and Standards of Medical Care in             Diabetes - 2011,Diabetes Care,2011,34 (Suppl 1):S11-S61.   Mean Plasma Glucose 134 (*) <117 mg/dL   Comment: Performed at [facility], CAPILLARY     Status: Abnormal   Collection Time    10/25/13 12:05 PM      Result Value Ref Range   Glucose-Capillary 259 (*) 70 - 99 mg/dL   Comment 1 Notify RN    GLUCOSE, CAPILLARY     Status: Abnormal   Collection Time    10/25/13  5:30 PM      Result Value Ref Range   Glucose-Capillary 150 (*) 70 - 99 mg/dL    Comment 1 Notify RN    GLUCOSE, CAPILLARY     Status: Abnormal   Collection Time    10/25/13 10:11 PM      Result Value Ref Range   Glucose-Capillary 242 (*) 70 - 99 mg/dL   Comment 1 Documented in Chart     Comment 2 Notify RN      ABGS No results found for this basename: PHART, PCO2, PO2ART, TCO2, HCO3,  in the last 72 hours CULTURES No results found for this or any previous visit (from the past 240 hour(s)). Studies/Results: No results found.  Medications:  Prior to Admission:  Prescriptions prior to admission  Medication Sig Dispense Refill  . allopurinol (ZYLOPRIM) 300 MG tablet Take 300 mg by mouth every morning.       . clopidogrel (PLAVIX) 75 MG tablet Take 75 mg by mouth every morning.       . donepezil (ARICEPT) 10 MG tablet Take 10 mg by mouth at bedtime.       . famotidine (PEPCID) 20 MG tablet TAKE ONE TABLET BY MOUTH TWICE DAILY.  60 tablet  5  . furosemide (LASIX) 40 MG tablet Take 20 mg by mouth daily.       . isosorbide mononitrate (IMDUR) 30 MG 24 hr tablet Take 0.5 tablets (15 mg total) by mouth daily.  30 tablet  11  . Linaclotide (LINZESS) 145 MCG CAPS capsule Take 1 capsule (145 mcg total) by mouth daily.  30 capsule  5  . metoprolol tartrate (LOPRESSOR) 25 MG tablet Take 0.5 tablets (12.5 mg total) by mouth 2 (two) times daily.  60 tablet  3  . nitroGLYCERIN (NITROSTAT) 0.4 MG SL tablet Place 1 tablet (0.4 mg total) under the tongue every 5 (five) minutes as needed for chest pain. 1 tablet under tongue at onset of chest pain;you may repeat every 5 minutes for up to 3 doses.  25 tablet  3  . Oxcarbazepine (TRILEPTAL) 300 MG tablet Take 300 mg by mouth 2 (two) times daily.       Marland Kitchen oxybutynin (DITROPAN) 5 MG tablet Take 5 mg by mouth 2 (two) times daily.       . pravastatin (PRAVACHOL) 40 MG tablet Take 40 mg by mouth daily. Daily at bedtime      . Tamsulosin HCl (FLOMAX) 0.4 MG CAPS Take 0.4 mg by mouth at bedtime.       Marland Kitchen tiotropium (SPIRIVA HANDIHALER) 18  MCG inhalation capsule Place 1 capsule (18 mcg total) into inhaler and inhale daily.  30 capsule  6  . VITAMIN B1-B12 IM Inject into the muscle every 30 (thirty) days. Once a month       Scheduled: . sodium chloride   Intravenous Once  . albuterol  2.5 mg Nebulization QID  . allopurinol  300 mg Oral q morning - 10a  . aspirin  81 mg Oral Daily  . clopidogrel  75 mg Oral q morning - 10a  . darbepoetin  60 mcg Subcutaneous Q7 days  . donepezil  10 mg Oral QHS  . famotidine  20 mg Oral Daily  . insulin aspart  0-5 Units Subcutaneous QHS  . insulin aspart  0-9 Units Subcutaneous TID WC  . isosorbide mononitrate  15 mg Oral Daily  . levofloxacin  500 mg Oral Q48H  . Linaclotide  145 mcg Oral Daily  . methylPREDNISolone (SOLU-MEDROL) injection  40 mg Intravenous Q12H  . metoprolol tartrate  12.5 mg Oral BID  . Oxcarbazepine  300 mg Oral BID  . oxybutynin  5 mg Oral BID  . polyethylene glycol  17 g Oral Daily  . simvastatin  20 mg Oral q1800  . sodium chloride  3 mL Intravenous Q12H  . tamsulosin  0.4 mg Oral QHS  . tiotropium  18 mcg Inhalation Daily  . torsemide  40 mg Oral Daily   Continuous:  WOE:HOZYYQMGNOIBB, acetaminophen, albuterol, alum & mag hydroxide-simeth, docusate sodium, nitroGLYCERIN, ondansetron (ZOFRAN) IV, ondansetron  Assesment: He was admitted with stroke. He has acute kidney injury. He was anemic and required 2 units of blood. Renal function testing for this morning is pending. He has improved as far as his hemoptysis is concerned. He is scheduled for rehabilitation stay before he goes home Principal Problem:   Acute lacunar infarction Active Problems:   Acute kidney injury   Anemia   Hemoptysis    Plan: Continue current treatments    LOS: 8 days   Roshaunda Starkey L 10/26/2013, 8:23 AM

## 2013-10-26 NOTE — Progress Notes (Signed)
Subjective: Interval History: Patient feels better presently with less cough and sputum production  Objective: Vital signs in last 24 hours: Temp:  [97.5 F (36.4 C)-98.6 F (37 C)] 98.6 F (37 C) (10/12 0605) Pulse Rate:  [80-95] 83 (10/12 0605) Resp:  [20] 20 (10/12 0605) BP: (105-128)/(54-55) 128/55 mmHg (10/12 0605) SpO2:  [91 %-98 %] 92 % (10/12 0713) Weight change:   Intake/Output from previous day: 10/11 0701 - 10/12 0700 In: 600 [P.O.:600] Out: 1050 [Urine:1050] Intake/Output this shift: Total I/O In: -  Out: 350 [Urine:350]  Generally he is alert and in no apparent distress Chest decrease breath sound and clear heart RRR no murmur Abdomen none tender and positive bowl sound Extremities  Trac edema  Lab Results:  Recent Labs  10/24/13 0552 10/25/13 0630  WBC 10.1 10.4  HGB 8.8* 9.3*  HCT 25.5* 26.0*  PLT 100* 102*   BMET:   Recent Labs  10/24/13 0552 10/25/13 0630  NA 137 134*  K 4.6 4.6  CL 100 96  CO2 20 19  GLUCOSE 188* 225*  BUN 76* 81*  CREATININE 4.43* 4.31*  CALCIUM 9.0 8.8   No results found for this basename: PTH,  in the last 72 hours Iron Studies:  No results found for this basename: IRON, TIBC, TRANSFERRIN, FERRITIN,  in the last 72 hours  Studies/Results: No results found.  I have reviewed the patient's current medications.  Assessment/Plan: Problem #1 acute kidney injury super imposed on chronic his BUN and creatinine is stable Presently on lasix and none oliguric. Denies any nassea or vomitng . Problem #2 hypertension: His blood pressure is reasonably controlled Problem #3 history of cough with some hemoptysis. Has improved Problem #4 coronary artery disease Problem #5 anemia: His hemoglobin hematocrit is low .  Patient started on ARANESP. His hemoglobin is better Problem #6 TIA: Presently doesn't have any weakness and getting better. Problem #7 metabolic bone disease: His calcium and phosphorus is range. Problem #8  chronic renal failure: Stage IV. Etiology was thought to be secondary to diabetes/hypertension/ischemic.Patient with poor appetite but no nausea or vomitng Problem#9 Metabolic acidosis: still low on sodium bicarbonate Plan: Continue on Demadex Patient does not want dialysis We'll check his basic metabolic panel in the morning. We will follow patient in 2 weeks if he is discharged   LOS: 8 days   Jenesis Suchy S 10/26/2013,9:29 AM

## 2013-10-26 NOTE — Plan of Care (Signed)
Problem: Consults Goal: Nutrition Consult-if indicated Pt is taking food and tolerating well without n/v noted at this time.

## 2013-10-27 LAB — GLUCOSE, CAPILLARY
Glucose-Capillary: 278 mg/dL — ABNORMAL HIGH (ref 70–99)
Glucose-Capillary: 149 mg/dL — ABNORMAL HIGH (ref 70–99)
Glucose-Capillary: 259 mg/dL — ABNORMAL HIGH (ref 70–99)

## 2013-10-27 LAB — BASIC METABOLIC PANEL
ANION GAP: 17 — AB (ref 5–15)
BUN: 103 mg/dL — ABNORMAL HIGH (ref 6–23)
CO2: 22 meq/L (ref 19–32)
Calcium: 8.8 mg/dL (ref 8.4–10.5)
Chloride: 96 mEq/L (ref 96–112)
Creatinine, Ser: 4.47 mg/dL — ABNORMAL HIGH (ref 0.50–1.35)
GFR calc Af Amer: 12 mL/min — ABNORMAL LOW (ref >90)
GFR calc non Af Amer: 10 mL/min — ABNORMAL LOW (ref >90)
Glucose, Bld: 193 mg/dL — ABNORMAL HIGH (ref 70–99)
POTASSIUM: 4.8 meq/L (ref 3.7–5.3)
SODIUM: 135 meq/L — AB (ref 137–147)

## 2013-10-27 MED ORDER — PREDNISONE 20 MG PO TABS
40.0000 mg | ORAL_TABLET | Freq: Every day | ORAL | Status: DC
  Administered 2013-10-27 – 2013-10-28 (×2): 40 mg via ORAL
  Filled 2013-10-27 (×2): qty 2

## 2013-10-27 NOTE — Progress Notes (Signed)
Physical Therapy Treatment Patient Details Name: [name] MRN: [id number] DOB: 13-Mar-1921 Today's Date: 10/27/2013    History of Present Illness With a history of 3 previous strokes, CT DX stage III, COPD and called stage A., coronary artery disease, hypertension, hyperlipidemia, emphysema, stable abdominal aneurysm, GERD. He presents to the hospital after having right arm pain with difficulty moving his right arm that was accompanied by slurred speech. These symptoms lasted about an hour. He went to bed last night around 9 or 10 PM and slept until 9 this morning. His son called him and spoke to him and, though he sounded groggy, his speech was normal. When his son called him back at noon, and he had slurred speech and difficulty word finding. Additionally, he dropped the phone several times. EMS was called and by the time he arrived to the hospital his symptoms had resolved.    PT Comments    Pt is progressing well.  He reports feeling more like himself and is wanting to increase his activity level.  His transfer ability is improving and he was able to ambulate 52' with a walker, standby assist.  His supplemental O2 is still at 5 L/min, however when I arrived he had the O2 cannula off for shaving.  His O2 sat was checked and found to be 89% on room air.  He had no dyspnea.  He was up in chair for lunch.  Follow Up Recommendations  SNF     Equipment Recommendations  None recommended by PT    Recommendations for Other Services  none     Precautions / Restrictions Precautions Precautions: Fall Restrictions Weight Bearing Restrictions: No    Mobility  Bed Mobility Overal bed mobility: Modified Independent             General bed mobility comments: HOB relatively flat  Transfers Overall transfer level: Needs assistance Equipment used: Rolling walker (2 wheeled) Transfers: Sit to/from Stand Sit to Stand: Supervision            Ambulation/Gait Ambulation/Gait  assistance: Supervision Ambulation Distance (Feet): 45 Feet Assistive device: Rolling walker (2 wheeled) Gait Pattern/deviations: WFL(Within Functional Limits)   Gait velocity interpretation: Below normal speed for age/gender General Gait Details: pt has minimal dyspnea during gait, on 5 L O2...he was instructed in correct pacing of gait and in increasing thoracic extension   Stairs            Wheelchair Mobility    Modified Rankin (Stroke Patients Only)       Balance Overall balance assessment: Needs assistance Sitting-balance support: No upper extremity supported;Feet supported Sitting balance-Leahy Scale: Good     Standing balance support: Bilateral upper extremity supported Standing balance-Leahy Scale: Good                      Cognition Arousal/Alertness: Awake/alert Behavior During Therapy: WFL for tasks assessed/performed Overall Cognitive Status: Within Functional Limits for tasks assessed                      Exercises General Exercises - Lower Extremity Ankle Circles/Pumps: AROM;Both;10 reps;Supine Quad Sets: AROM;Both;10 reps;Supine Gluteal Sets: AROM;Both;10 reps;Supine Short Arc Quad: AROM;Both;10 reps;Supine Heel Slides: AROM;Both;10 reps;Supine Other Exercises Other Exercises: resisted clam shell x 10    General Comments        Pertinent Vitals/Pain Pain Assessment: No/denies pain    Home Living  Prior Function            PT Goals (current goals can now be found in the care plan section) Progress towards PT goals: Progressing toward goals    Frequency  Min 3X/week    PT Plan Current plan remains appropriate    Co-evaluation             End of Session Equipment Utilized During Treatment: Gait belt Activity Tolerance: Patient tolerated treatment well Patient left: in chair;with call bell/phone within reach;with chair alarm set;with family/visitor present     Time:  4496-7591 PT Time Calculation (min): 31 min  Charges:  $Gait Training: 8-22 mins $Therapeutic Exercise: 8-22 mins                    G Codes:      Sable Feil 11-09-13, 11:46 AM

## 2013-10-27 NOTE — Progress Notes (Signed)
[name]  MRN: [id number]  DOB/AGE: [birth year] 80 y.o.  Primary Care Physician:FAGAN,ROY, MD  Admit date: 10/18/2013  Chief Complaint:  Chief Complaint  Patient presents with  . Aphasia    Strickland-Pt presented on  10/18/2013 with  Chief Complaint  Patient presents with  . Aphasia  .    Pt today feels better  Meds . sodium chloride   Intravenous Once  . allopurinol  300 mg Oral q morning - 10a  . aspirin  81 mg Oral Daily  . clopidogrel  75 mg Oral q morning - 10a  . darbepoetin  60 mcg Subcutaneous Q7 days  . donepezil  10 mg Oral QHS  . famotidine  20 mg Oral Daily  . insulin aspart  0-5 Units Subcutaneous QHS  . insulin aspart  0-9 Units Subcutaneous TID WC  . isosorbide mononitrate  15 mg Oral Daily  . levofloxacin  500 mg Oral Q48H  . Linaclotide  145 mcg Oral Daily  . metoprolol tartrate  12.5 mg Oral BID  . Oxcarbazepine  300 mg Oral BID  . oxybutynin  5 mg Oral BID  . polyethylene glycol  17 g Oral Daily  . predniSONE  40 mg Oral Q breakfast  . simvastatin  20 mg Oral q1800  . sodium chloride  3 mL Intravenous Q12H  . tamsulosin  0.4 mg Oral QHS  . tiotropium  18 mcg Inhalation Daily  . torsemide  40 mg Oral Daily     Physical Exam: Vital signs in last 24 hours: Temp:  [97.7 F (36.5 C)-98 F (36.7 C)] 97.7 F (36.5 C) (10/13 0513) Pulse Rate:  [87-93] 91 (10/13 0909) Resp:  [19-20] 19 (10/13 0909) BP: (114-126)/(44-59) 126/44 mmHg (10/13 0513) SpO2:  [91 %-97 %] 92 % (10/13 0909) Weight change:  Last BM Date: 10/24/13  Intake/Output from previous day: 10/12 0701 - 10/13 0700 In: 480 [P.O.:480] Out: 2625 [Urine:2625]     Physical Exam: General- pt is awake,alert, oriented to time place and person Resp- No acute REsp distress, CTA B/L NO Rhonchi CVS- S1S2 regular in rate and rhythm GIT- BS+, soft, NT, ND EXT- NO LE Edema, Cyanosis   Lab Results: CBC  Recent Labs  10/25/13 0630  WBC 10.4  HGB 9.3*  HCT 26.0*  PLT 102*     BMET  Recent Labs  10/25/13 0630 10/27/13 0607  NA 134* 135*  K 4.6 4.8  CL 96 96  CO2 19 22  GLUCOSE 225* 193*  BUN 81* 103*  CREATININE 4.31* 4.47*  CALCIUM 8.8 8.8   Trend Creat 2015  5.29=> 4.57=>4.31=>4.47           2.1--2.8 ( Baseline) 2014  1.4--1.7 2013  1.3--1.4 2012   1.3 2011   1.6--1.7 2010    1.6--1.9 2009   1,6    Lab Results  Component Value Date   CALCIUM 8.8 10/27/2013   PHOS 4.4 10/20/2013          Impression: 1)Renal  AKI secondary to Prerenal/ATN/Interstitial                 AKI little worse                High BUN/Creat ratio               CKD stage 4 .               CKD since 2009  CKD secondary to HTN/Age associated decline                Progression of CKD now marked with AKI                Proteinura Absent .                Autoimmune work up Negative                 ANA/ANCA -negative                  Complements not low  2)HTN Medication- On Diuretics- On Beta blockers    3)Anemia HGb  at goal (9--11) Received [facility] Primary team folowing   4)CKD Mineral-Bone Disorder Phosphorus at goal. Calcium at goal.  5)CNS-Admitted with TIA Neurology and Primary MD following  6)Electrolytes Normokalemic Hyponatremic   7)Acid base Co2 at goal     Plan:   Will hold diuretics for a day Will restart after 24-48 hrs . Will follow Bmet.    [name] 10/27/2013, 9:14 AM

## 2013-10-27 NOTE — Progress Notes (Signed)
Subjective: [name] continues to note improvement. He is coughing less. He has less hemoptysis. He has no fever.  Objective: Vital signs in last 24 hours: Filed Vitals:   10/26/13 2002 10/26/13 2030 10/27/13 0513 10/27/13 0732  BP:  114/53 126/44   Pulse:  87 93   Temp:  97.8 F (36.6 C) 97.7 F (36.5 C)   TempSrc:  Oral Oral   Resp:  20 20   Height:      Weight:      SpO2: 97% 95% 92% 91%   Weight change:   Intake/Output Summary (Last 24 hours) at 10/27/13 0746 Last data filed at 10/27/13 0452  Gross per 24 hour  Intake    480 ml  Output   2625 ml  Net  -2145 ml    Physical Exam: Alert and in no distress. Lungs reveal bilateral rales. Heart regular with no murmurs. Abdomen soft and nontender. Lower extremities reveal 1+ edema. Neuro at baseline.  Lab Results:    Results for orders placed during the hospital encounter of 10/18/13 (from the past 24 hour(s))  GLUCOSE, CAPILLARY     Status: Abnormal   Collection Time    10/26/13  8:14 AM      Result Value Ref Range   Glucose-Capillary 195 (*) 70 - 99 mg/dL   Comment 1 Notify RN    GLUCOSE, CAPILLARY     Status: Abnormal   Collection Time    10/26/13 11:51 AM      Result Value Ref Range   Glucose-Capillary 327 (*) 70 - 99 mg/dL   Comment 1 Notify RN    GLUCOSE, CAPILLARY     Status: Abnormal   Collection Time    10/26/13  5:19 PM      Result Value Ref Range   Glucose-Capillary 257 (*) 70 - 99 mg/dL   Comment 1 Notify RN    GLUCOSE, CAPILLARY     Status: Abnormal   Collection Time    10/26/13  8:29 PM      Result Value Ref Range   Glucose-Capillary 163 (*) 70 - 99 mg/dL   Comment 1 Documented in Chart     Comment 2 Notify RN    BASIC METABOLIC PANEL     Status: Abnormal   Collection Time    10/27/13  6:07 AM      Result Value Ref Range   Sodium 135 (*) 137 - 147 mEq/L   Potassium 4.8  3.7 - 5.3 mEq/L   Chloride 96  96 - 112 mEq/L   CO2 22  19 - 32 mEq/L   Glucose, Bld 193 (*) 70 - 99 mg/dL   BUN 103 (*) 6  - 23 mg/dL   Creatinine, Ser 4.47 (*) 0.50 - 1.35 mg/dL   Calcium 8.8  8.4 - 10.5 mg/dL   GFR calc non Af Amer 10 (*) >90 mL/min   GFR calc Af Amer 12 (*) >90 mL/min   Anion gap 17 (*) 5 - 15     ABGS No results found for this basename: PHART, PCO2, PO2ART, TCO2, HCO3,  in the last 72 hours CULTURES No results found for this or any previous visit (from the past 240 hour(s)). Studies/Results: No results found. Micro Results: No results found for this or any previous visit (from the past 240 hour(s)). Studies/Results: No results found. Medications:  I have reviewed the patient's current medications Scheduled Meds: . sodium chloride   Intravenous Once  . albuterol  2.5 mg Nebulization  QID  . allopurinol  300 mg Oral q morning - 10a  . aspirin  81 mg Oral Daily  . clopidogrel  75 mg Oral q morning - 10a  . darbepoetin  60 mcg Subcutaneous Q7 days  . donepezil  10 mg Oral QHS  . famotidine  20 mg Oral Daily  . insulin aspart  0-5 Units Subcutaneous QHS  . insulin aspart  0-9 Units Subcutaneous TID WC  . isosorbide mononitrate  15 mg Oral Daily  . levofloxacin  500 mg Oral Q48H  . Linaclotide  145 mcg Oral Daily  . metoprolol tartrate  12.5 mg Oral BID  . Oxcarbazepine  300 mg Oral BID  . oxybutynin  5 mg Oral BID  . polyethylene glycol  17 g Oral Daily  . predniSONE  40 mg Oral Q breakfast  . simvastatin  20 mg Oral q1800  . sodium chloride  3 mL Intravenous Q12H  . tamsulosin  0.4 mg Oral QHS  . tiotropium  18 mcg Inhalation Daily  . torsemide  40 mg Oral Daily   Continuous Infusions:  PRN Meds:.acetaminophen, acetaminophen, albuterol, alum & mag hydroxide-simeth, docusate sodium, nitroGLYCERIN, ondansetron (ZOFRAN) IV, ondansetron   Assessment/Plan: #1. Lacunar infarct. Continue current antiplatelet therapy. #2. Pneumonia. Hemoptysis. Switch Solu-Medrol to prednisone 40 mg daily. Discussed with pulmonology. Continue Levaquin. #3. COPD. Continue Spiriva and continue  supplemental oxygen. #4. Diabetes. Continue sliding scale NovoLog. #5. Acute on chronic renal failure. BUN is increased to 103. Would hold Demadex today if okay with nephrology. Creatinine stable. #6. Chronic diastolic heart failure. Stable.  Continue physical therapy. He'll need a rehabilitation stay after hospitalization. These arrangements are being made. Principal Problem:   Acute lacunar infarction Active Problems:   Acute kidney injury   Anemia   Hemoptysis     LOS: 9 days   [name] 10/27/2013, 7:46 AM

## 2013-10-27 NOTE — Progress Notes (Signed)
Subjective: He says he feels better. He has no new complaints. I discussed his situation with Dr. Willey Blade and we agree that he is approaching maximum hospital benefit.  Objective: Vital signs in last 24 hours: Temp:  [97.7 F (36.5 C)-98 F (36.7 C)] 97.7 F (36.5 C) (10/13 0513) Pulse Rate:  [87-93] 93 (10/13 0513) Resp:  [20] 20 (10/13 0513) BP: (114-126)/(44-59) 126/44 mmHg (10/13 0513) SpO2:  [91 %-97 %] 91 % (10/13 0732) Weight change:  Last BM Date: 10/24/13  Intake/Output from previous day: 10/12 0701 - 10/13 0700 In: 480 [P.O.:480] Out: 2625 [Urine:2625]  PHYSICAL EXAM General appearance: alert, cooperative and mild distress Resp: Rhonchi bilaterally Cardio: regular rate and rhythm, S1, S2 normal, no murmur, click, rub or gallop GI: soft, non-tender; bowel sounds normal; no masses,  no organomegaly Extremities: 1+ edema  Lab Results:  Results for orders placed during the hospital encounter of 10/18/13 (from the past 48 hour(s))  GLUCOSE, CAPILLARY     Status: Abnormal   Collection Time    10/25/13 12:05 PM      Result Value Ref Range   Glucose-Capillary 259 (*) 70 - 99 mg/dL   Comment 1 Notify RN    GLUCOSE, CAPILLARY     Status: Abnormal   Collection Time    10/25/13  5:30 PM      Result Value Ref Range   Glucose-Capillary 150 (*) 70 - 99 mg/dL   Comment 1 Notify RN    GLUCOSE, CAPILLARY     Status: Abnormal   Collection Time    10/25/13 10:11 PM      Result Value Ref Range   Glucose-Capillary 242 (*) 70 - 99 mg/dL   Comment 1 Documented in Chart     Comment 2 Notify RN    GLUCOSE, CAPILLARY     Status: Abnormal   Collection Time    10/26/13  8:14 AM      Result Value Ref Range   Glucose-Capillary 195 (*) 70 - 99 mg/dL   Comment 1 Notify RN    GLUCOSE, CAPILLARY     Status: Abnormal   Collection Time    10/26/13 11:51 AM      Result Value Ref Range   Glucose-Capillary 327 (*) 70 - 99 mg/dL   Comment 1 Notify RN    GLUCOSE, CAPILLARY     Status:  Abnormal   Collection Time    10/26/13  5:19 PM      Result Value Ref Range   Glucose-Capillary 257 (*) 70 - 99 mg/dL   Comment 1 Notify RN    GLUCOSE, CAPILLARY     Status: Abnormal   Collection Time    10/26/13  8:29 PM      Result Value Ref Range   Glucose-Capillary 163 (*) 70 - 99 mg/dL   Comment 1 Documented in Chart     Comment 2 Notify RN    BASIC METABOLIC PANEL     Status: Abnormal   Collection Time    10/27/13  6:07 AM      Result Value Ref Range   Sodium 135 (*) 137 - 147 mEq/L   Potassium 4.8  3.7 - 5.3 mEq/L   Chloride 96  96 - 112 mEq/L   CO2 22  19 - 32 mEq/L   Glucose, Bld 193 (*) 70 - 99 mg/dL   BUN 103 (*) 6 - 23 mg/dL   Creatinine, Ser 4.47 (*) 0.50 - 1.35 mg/dL   Calcium 8.8  8.4 - 10.5 mg/dL   GFR calc non Af Amer 10 (*) >90 mL/min   GFR calc Af Amer 12 (*) >90 mL/min   Comment: (NOTE)     The eGFR has been calculated using the CKD EPI equation.     This calculation has not been validated in all clinical situations.     eGFR's persistently <90 mL/min signify possible Chronic Kidney     Disease.   Anion gap 17 (*) 5 - 15    ABGS No results found for this basename: PHART, PCO2, PO2ART, TCO2, HCO3,  in the last 72 hours CULTURES No results found for this or any previous visit (from the past 240 hour(s)). Studies/Results: No results found.  Medications:  Prior to Admission:  Prescriptions prior to admission  Medication Sig Dispense Refill  . allopurinol (ZYLOPRIM) 300 MG tablet Take 300 mg by mouth every morning.       . clopidogrel (PLAVIX) 75 MG tablet Take 75 mg by mouth every morning.       . donepezil (ARICEPT) 10 MG tablet Take 10 mg by mouth at bedtime.       . famotidine (PEPCID) 20 MG tablet TAKE ONE TABLET BY MOUTH TWICE DAILY.  60 tablet  5  . furosemide (LASIX) 40 MG tablet Take 20 mg by mouth daily.       . isosorbide mononitrate (IMDUR) 30 MG 24 hr tablet Take 0.5 tablets (15 mg total) by mouth daily.  30 tablet  11  . Linaclotide  (LINZESS) 145 MCG CAPS capsule Take 1 capsule (145 mcg total) by mouth daily.  30 capsule  5  . metoprolol tartrate (LOPRESSOR) 25 MG tablet Take 0.5 tablets (12.5 mg total) by mouth 2 (two) times daily.  60 tablet  3  . nitroGLYCERIN (NITROSTAT) 0.4 MG SL tablet Place 1 tablet (0.4 mg total) under the tongue every 5 (five) minutes as needed for chest pain. 1 tablet under tongue at onset of chest pain;you may repeat every 5 minutes for up to 3 doses.  25 tablet  3  . Oxcarbazepine (TRILEPTAL) 300 MG tablet Take 300 mg by mouth 2 (two) times daily.       Marland Kitchen oxybutynin (DITROPAN) 5 MG tablet Take 5 mg by mouth 2 (two) times daily.       . pravastatin (PRAVACHOL) 40 MG tablet Take 40 mg by mouth daily. Daily at bedtime      . Tamsulosin HCl (FLOMAX) 0.4 MG CAPS Take 0.4 mg by mouth at bedtime.       Marland Kitchen tiotropium (SPIRIVA HANDIHALER) 18 MCG inhalation capsule Place 1 capsule (18 mcg total) into inhaler and inhale daily.  30 capsule  6  . VITAMIN B1-B12 IM Inject into the muscle every 30 (thirty) days. Once a month       Scheduled: . sodium chloride   Intravenous Once  . albuterol  2.5 mg Nebulization QID  . allopurinol  300 mg Oral q morning - 10a  . aspirin  81 mg Oral Daily  . clopidogrel  75 mg Oral q morning - 10a  . darbepoetin  60 mcg Subcutaneous Q7 days  . donepezil  10 mg Oral QHS  . famotidine  20 mg Oral Daily  . insulin aspart  0-5 Units Subcutaneous QHS  . insulin aspart  0-9 Units Subcutaneous TID WC  . isosorbide mononitrate  15 mg Oral Daily  . levofloxacin  500 mg Oral Q48H  . Linaclotide  145 mcg Oral Daily  .  metoprolol tartrate  12.5 mg Oral BID  . Oxcarbazepine  300 mg Oral BID  . oxybutynin  5 mg Oral BID  . polyethylene glycol  17 g Oral Daily  . predniSONE  40 mg Oral Q breakfast  . simvastatin  20 mg Oral q1800  . sodium chloride  3 mL Intravenous Q12H  . tamsulosin  0.4 mg Oral QHS  . tiotropium  18 mcg Inhalation Daily  . torsemide  40 mg Oral Daily    Continuous:  VGC:[id number], acetaminophen, albuterol, alum & mag hydroxide-simeth, docusate sodium, nitroGLYCERIN, ondansetron (ZOFRAN) IV, ondansetron  Assesment: He was admitted with acute neurological episode. He has acute on chronic renal failure. He has a pulmonary hemorrhage and that is improving. I think it is okay to switch him to oral prednisone and oral antibiotics now he probably needs another 2 weeks of each. Principal Problem:   Acute lacunar infarction Active Problems:   Acute kidney injury   Anemia   Hemoptysis    Plan: Continue treatments change his medications as above. I will follow more "socially"    LOS: 9 days   Harvy Riera L 10/27/2013, 8:54 AM

## 2013-10-27 NOTE — Clinical Social Work Placement (Signed)
Clinical Social Work Department CLINICAL SOCIAL WORK PLACEMENT NOTE 10/27/2013  Patient:  [name], [name]  Account Number:  [id number] Admit date:  10/18/2013  Clinical Social Worker:  Benay Pike, LCSW  Date/time:  10/23/2013 02:31 PM  Clinical Social Work is seeking post-discharge placement for this patient at the following level of care:   SKILLED NURSING   (*CSW will update this form in Epic as items are completed)   10/23/2013  Patient/family provided with [place] Department of Clinical Social Work's list of facilities offering this level of care within the geographic area requested by the patient (or if unable, by the patient's family).  10/23/2013  Patient/family informed of their freedom to choose among providers that offer the needed level of care, that participate in Medicare, Medicaid or managed care program needed by the patient, have an available bed and are willing to accept the patient.  10/23/2013  Patient/family informed of [facility]' ownership interest in [facility], as well as of the fact that they are under no obligation to receive care at this facility.  PASARR submitted to EDS on 10/23/2013 PASARR number received on 10/23/2013  FL2 transmitted to all facilities in geographic area requested by pt/family on  10/23/2013 FL2 transmitted to all facilities within larger geographic area on   Patient informed that his/her managed care company has contracts with or will negotiate with  certain facilities, including the following:     Patient/family informed of bed offers received:  10/27/2013 Patient chooses bed at [place], [facility] Physician recommends and patient chooses bed at  [place], [facility]  Patient to be transferred to  on   Patient to be transferred to facility by  Patient and family notified of transfer on  Name of family member notified:    The following physician request were entered in Epic:   Additional  Comments:  Benay Pike, [place]

## 2013-10-27 NOTE — Clinical Social Work Note (Signed)
CSW presented available bed offers to pt and pt's son and daughter-in-law. They accept [facility]. Facility and MD notified. Awaiting stability for d/c.  Benay Pike, [place]

## 2013-10-28 LAB — GLUCOSE, CAPILLARY
GLUCOSE-CAPILLARY: 130 mg/dL — AB (ref 70–99)
GLUCOSE-CAPILLARY: 173 mg/dL — AB (ref 70–99)

## 2013-10-28 MED ORDER — TORSEMIDE 20 MG PO TABS: 20.0000 mg | ORAL_TABLET | Freq: Every day | ORAL | Status: AC

## 2013-10-28 MED ORDER — ALLOPURINOL 100 MG PO TABS
100.0000 mg | ORAL_TABLET | Freq: Every morning | ORAL | Status: DC
  Administered 2013-10-28: 100 mg via ORAL
  Filled 2013-10-28: qty 1

## 2013-10-28 MED ORDER — ASPIRIN 81 MG PO CHEW: 81.0000 mg | CHEWABLE_TABLET | Freq: Every day | ORAL | Status: DC

## 2013-10-28 MED ORDER — PREDNISONE 20 MG PO TABS: 40.0000 mg | ORAL_TABLET | Freq: Every day | ORAL | Status: DC

## 2013-10-28 MED ORDER — LEVOFLOXACIN 500 MG PO TABS: 500.0000 mg | ORAL_TABLET | ORAL | Status: DC

## 2013-10-28 MED ORDER — ALLOPURINOL 100 MG PO TABS
100.0000 mg | ORAL_TABLET | Freq: Every morning | ORAL | Status: AC
Start: 2013-10-28 — End: ?

## 2013-10-28 NOTE — Progress Notes (Signed)
[name]  MRN: [id number]  DOB/AGE: [birth year] 80 y.o.  Primary Care Physician:FAGAN,ROY, MD  Admit date: 10/18/2013  Chief Complaint:  Chief Complaint  Patient presents with  . Aphasia    S-Pt presented on  10/18/2013 with  Chief Complaint  Patient presents with  . Aphasia  .    Pt today feels better  Meds . sodium chloride   Intravenous Once  . allopurinol  100 mg Oral q morning - 10a  . aspirin  81 mg Oral Daily  . clopidogrel  75 mg Oral q morning - 10a  . darbepoetin  60 mcg Subcutaneous Q7 days  . donepezil  10 mg Oral QHS  . famotidine  20 mg Oral Daily  . insulin aspart  0-5 Units Subcutaneous QHS  . insulin aspart  0-9 Units Subcutaneous TID WC  . isosorbide mononitrate  15 mg Oral Daily  . levofloxacin  500 mg Oral Q48H  . Linaclotide  145 mcg Oral Daily  . metoprolol tartrate  12.5 mg Oral BID  . Oxcarbazepine  300 mg Oral BID  . oxybutynin  5 mg Oral BID  . polyethylene glycol  17 g Oral Daily  . predniSONE  40 mg Oral Q breakfast  . simvastatin  20 mg Oral q1800  . sodium chloride  3 mL Intravenous Q12H  . tamsulosin  0.4 mg Oral QHS  . tiotropium  18 mcg Inhalation Daily     Physical Exam: Vital signs in last 24 hours: Temp:  [97.5 F (36.4 C)-98.1 F (36.7 C)] 97.7 F (36.5 C) (10/14 0552) Pulse Rate:  [81-93] 81 (10/14 0552) Resp:  [18-20] 19 (10/14 0552) BP: (114-134)/(55-71) 114/55 mmHg (10/14 0552) SpO2:  [93 %-97 %] 93 % (10/14 0715) Weight change:  Last BM Date: 10/24/13  Intake/Output from previous day: 10/13 0701 - 10/14 0700 In: 720 [P.O.:720] Out: 2150 [Urine:2150]     Physical Exam: General- pt is awake,alert, oriented to time place and person Resp- No acute REsp distress, CTA B/L NO Rhonchi CVS- S1S2 regular in rate and rhythm GIT- BS+, soft, NT, ND EXT- NO LE Edema, Cyanosis   Lab Results: CBC No results found for this basename: WBC, HGB, HCT, PLT,  in the last 72 hours  BMET  Recent Labs  10/27/13 0607   NA 135*  K 4.8  CL 96  CO2 22  GLUCOSE 193*  BUN 103*  CREATININE 4.47*  CALCIUM 8.8   Trend Creat 2015  5.29=> 4.57=>4.31=>4.47           2.1--2.8 ( Baseline) 2014  1.4--1.7 2013  1.3--1.4 2012   1.3 2011   1.6--1.7 2010    1.6--1.9 2009   1,6    Lab Results  Component Value Date   CALCIUM 8.8 10/27/2013   PHOS 4.4 10/20/2013          Impression: 1)Renal  AKI secondary to Prerenal/ATN/Interstitial                 AKI little worse                High BUN/Creat ratio               CKD stage 4 .               CKD since 2009               CKD secondary to HTN/Age associated decline  Progression of CKD now marked with AKI                Proteinura Absent .                Autoimmune work up Negative                 ANA/ANCA -negative                  Complements not low  2)HTN Medication- On Diuretics- On Beta blockers    3)Anemia HGb  at goal (9--11) Received [facility] Primary team folowing   4)CKD Mineral-Bone Disorder Phosphorus at goal. Calcium at goal.  5)CNS-Admitted with TIA Neurology and Primary MD following  6)Electrolytes Normokalemic Hyponatremic   7)Acid base Co2 at goal     Plan:  Pt will benefit from outpt Nephrology follow up     [place] 10/28/2013, 9:41 AM

## 2013-10-28 NOTE — Progress Notes (Signed)
Report given to [place] at [facility].  Patient aware of discharge and family will be here at 1:00pm for transport of patient.  Will continue to monitor, educate, and prepare for discharge.

## 2013-10-28 NOTE — Discharge Summary (Signed)
Physician Discharge Summary  [name] GLO:[id number] DOB: 05-14-1921 DOA: 10/18/2013   Admit date: 10/18/2013 Discharge date: 10/28/2013  Discharge Diagnoses: #1. Lacunar stroke. #2. Acute on chronic renal failure. Stage IV. #3. Chronic diastolic congestive heart failure. #4. Type 2 diabetes. #5. Anemia. #6. Hypertension. #7. Coronary artery disease. #8. Status post pacemaker placement. #9. Seizure disorder. #10. Pneumonia. #11. Hemoptysis. #12. Gout. #13. Hyperlipidemia. Principal Problem:   Acute lacunar infarction Active Problems:   Acute kidney injury   Anemia   Hemoptysis    Wt Readings from Last 3 Encounters:  10/18/13 177 lb (80.287 kg)  09/23/13 179 lb (81.194 kg)  08/26/13 178 lb (80.74 kg)     Hospital Course:  This patient is a 80 year old male who presented initially with difficulties with speech as well as continued cough with hemoptysis. His speech changes resolved. He was seen in consultation by neurology. The CT scan revealed old stroke changes but no sign of acute stroke. He cannot have an MRI because of his pacemaker. He was felt to have had an acute lacunar infarct. He has been continued on antiplatelet therapy with Plavix and aspirin.He has underlying coronary artery disease which has been asymptomatic.  He complained of persistent cough with hemoptysis despite recent outpatient antibiotic therapy with levofloxacin. A CT scan of the chest revealed bilateral infiltrates. He was seen in pulmonary consultation by Dr. Luan Pulling. He has been treated with levofloxacin, Solu-Medrol and supplemental oxygen. His respiratory status has significantly improved. He is coughing much less. His hemoptysis has nearly resolved. He will continue levofloxacin for an additional 7 doses and will continue a prednisone taper orally. He has COPD and is chronically on Spiriva and nighttime oxygen. He is now on oxygen at 3 L by nasal cannula and maintaining oxygen saturations in the  90s.  He has had acute on chronic renal failure. His creatinine over the past year has increased from approximately 1.5 now to 4.5. He had an initial creatinine of 5.2 in the hospital. He has been seen by nephrology. Has been treated with modifications in his fluids and his diuretic therapy. He has underlying chronic diastolic heart failure and has required maintenance diuretic usage. His diuretic dose has been modified now to torsemide 20 mg daily. He is not interested in pursuing dialysis in the future.  He required a transfusion of 2 units of packed red cells early in his hospitalization for drop in his hemoglobin to 7.3. His hemoglobin has remained stable now in the 9 range. Is not experiencing any significant bleeding at this point.  Arrang[facility] He will have followup there and will have followup here when he returns home. He will followup with nephrology in the near future. He may require Aranesp on a regular basis.  His condition is much improved on October 14. He is stable for discharge.  Discharge Instructions     Medication List    STOP taking these medications       furosemide 40 MG tablet  Commonly known as:  LASIX      TAKE these medications       allopurinol 100 MG tablet  Commonly known as:  ZYLOPRIM  Take 1 tablet (100 mg total) by mouth every morning.     aspirin 81 MG chewable tablet  Chew 1 tablet (81 mg total) by mouth daily.     clopidogrel 75 MG tablet  Commonly known as:  PLAVIX  Take 75 mg by mouth  every morning.     donepezil 10 MG tablet  Commonly known as:  ARICEPT  Take 10 mg by mouth at bedtime.     famotidine 20 MG tablet  Commonly known as:  PEPCID  TAKE ONE TABLET BY MOUTH TWICE DAILY.     isosorbide mononitrate 30 MG 24 hr tablet  Commonly known as:  IMDUR  Take 0.5 tablets (15 mg total) by mouth daily.     levofloxacin 500 MG tablet  Commonly known as:  LEVAQUIN  Take 1 tablet  (500 mg total) by mouth every other day.     Linaclotide 145 MCG Caps capsule  Commonly known as:  LINZESS  Take 1 capsule (145 mcg total) by mouth daily.     metoprolol tartrate 25 MG tablet  Commonly known as:  LOPRESSOR  Take 0.5 tablets (12.5 mg total) by mouth 2 (two) times daily.     nitroGLYCERIN 0.4 MG SL tablet  Commonly known as:  NITROSTAT  Place 1 tablet (0.4 mg total) under the tongue every 5 (five) minutes as needed for chest pain. 1 tablet under tongue at onset of chest pain;you may repeat every 5 minutes for up to 3 doses.     Oxcarbazepine 300 MG tablet  Commonly known as:  TRILEPTAL  Take 300 mg by mouth 2 (two) times daily.     oxybutynin 5 MG tablet  Commonly known as:  DITROPAN  Take 5 mg by mouth 2 (two) times daily.     pravastatin 40 MG tablet  Commonly known as:  PRAVACHOL  Take 40 mg by mouth daily. Daily at bedtime     predniSONE 20 MG tablet  Commonly known as:  DELTASONE  Take 2 tablets (40 mg total) by mouth daily with breakfast.     tamsulosin 0.4 MG Caps capsule  Commonly known as:  FLOMAX  Take 0.4 mg by mouth at bedtime.     tiotropium 18 MCG inhalation capsule  Commonly known as:  SPIRIVA HANDIHALER  Place 1 capsule (18 mcg total) into inhaler and inhale daily.     torsemide 20 MG tablet  Commonly known as:  DEMADEX  Take 1 tablet (20 mg total) by mouth daily.     VITAMIN B1-B12 IM  Inject into the muscle every 30 (thirty) days. Once a month         [name] 10/28/2013

## 2013-10-28 NOTE — Clinical Social Work Placement (Signed)
Clinical Social Work Department CLINICAL SOCIAL WORK PLACEMENT NOTE 10/28/2013  Patient:  [name], [name]  Account Number:  [id number] Admit date:  10/18/2013  Clinical Social Worker:  Benay Pike, LCSW  Date/time:  10/23/2013 02:31 PM  Clinical Social Work is seeking post-discharge placement for this patient at the following level of care:   SKILLED NURSING   (*CSW will update this form in Epic as items are completed)   10/23/2013  Patient/family provided with [place] Department of Clinical Social Work's list of facilities offering this level of care within the geographic area requested by the patient (or if unable, by the patient's family).  10/23/2013  Patient/family informed of their freedom to choose among providers that offer the needed level of care, that participate in Medicare, Medicaid or managed care program needed by the patient, have an available bed and are willing to accept the patient.  10/23/2013  Patient/family informed of [facility]' ownership interest in [facility], as well as of the fact that they are under no obligation to receive care at this facility.  PASARR submitted to EDS on 10/23/2013 PASARR number received on 10/23/2013  FL2 transmitted to all facilities in geographic area requested by pt/family on  10/23/2013 FL2 transmitted to all facilities within larger geographic area on   Patient informed that his/her managed care company has contracts with or will negotiate with  certain facilities, including the following:     Patient/family informed of bed offers received:  10/27/2013 Patient chooses bed at [place], [facility] Physician recommends and patient chooses bed at  [place], [facility]  Patient to be transferred to [place], [facility] on  10/28/2013 Patient to be transferred to facility by family Patient and family notified of transfer on 10/28/2013 Name of family member notified:  Curt Bears-  daughter-in-law  The following physician request were entered in Epic:   Additional Comments:  Benay Pike, [place]

## 2013-10-28 NOTE — Clinical Social Work Note (Signed)
Pt d/c today to [facility]. Pt, pt's daughter-in-law Curt Bears, and facility aware and agreeable. D/C summary faxed. Pt to transfer with family.  Benay Pike, [place]

## 2013-11-04 ENCOUNTER — Encounter: Payer: Self-pay | Admitting: Internal Medicine

## 2013-11-04 ENCOUNTER — Ambulatory Visit (INDEPENDENT_AMBULATORY_CARE_PROVIDER_SITE_OTHER): Payer: Medicare Other | Admitting: *Deleted

## 2013-11-04 DIAGNOSIS — R001 Bradycardia, unspecified: Secondary | ICD-10-CM

## 2013-11-04 NOTE — Progress Notes (Signed)
Remote pacemaker transmission.   

## 2013-11-06 LAB — MDC_IDC_ENUM_SESS_TYPE_REMOTE
Battery Remaining Percentage: 95.5 %
Battery Voltage: 3.02 V
Brady Statistic AS VS Percent: 96 %
Brady Statistic RV Percent Paced: 2.9 %
Date Time Interrogation Session: 20151021060014
Implantable Pulse Generator Model: 2240
Implantable Pulse Generator Serial Number: 3013737
Lead Channel Impedance Value: 410 Ohm
Lead Channel Pacing Threshold Amplitude: 0.75 V
Lead Channel Pacing Threshold Pulse Width: 0.5 ms
Lead Channel Sensing Intrinsic Amplitude: 5 mV
Lead Channel Setting Pacing Amplitude: 2.5 V
Lead Channel Setting Pacing Pulse Width: 0.5 ms
MDC IDC MSMT BATTERY REMAINING LONGEVITY: 128 mo
MDC IDC MSMT LEADCHNL RA PACING THRESHOLD PULSEWIDTH: 0.5 ms
MDC IDC MSMT LEADCHNL RV IMPEDANCE VALUE: 560 Ohm
MDC IDC MSMT LEADCHNL RV PACING THRESHOLD AMPLITUDE: 0.75 V
MDC IDC MSMT LEADCHNL RV SENSING INTR AMPL: 6.9 mV
MDC IDC SET LEADCHNL RA PACING AMPLITUDE: 2 V
MDC IDC SET LEADCHNL RV SENSING SENSITIVITY: 2 mV
MDC IDC STAT BRADY AP VP PERCENT: 1.5 %
MDC IDC STAT BRADY AP VS PERCENT: 1 %
MDC IDC STAT BRADY AS VP PERCENT: 1.4 %
MDC IDC STAT BRADY RA PERCENT PACED: 1.6 %

## 2013-11-18 ENCOUNTER — Encounter: Payer: Self-pay | Admitting: Cardiology

## 2013-12-03 ENCOUNTER — Encounter (INDEPENDENT_AMBULATORY_CARE_PROVIDER_SITE_OTHER): Payer: Self-pay | Admitting: *Deleted

## 2013-12-04 ENCOUNTER — Encounter: Payer: Self-pay | Admitting: Cardiology

## 2013-12-13 ENCOUNTER — Emergency Department (HOSPITAL_COMMUNITY): Payer: Medicare Other

## 2013-12-13 ENCOUNTER — Encounter (HOSPITAL_COMMUNITY): Payer: Self-pay

## 2013-12-13 ENCOUNTER — Observation Stay (HOSPITAL_COMMUNITY)
Admission: EM | Admit: 2013-12-13 | Discharge: 2013-12-17 | Disposition: A | Discharge status: Skilled Nursing Facility | Payer: Medicare Other | Attending: Internal Medicine | Admitting: Internal Medicine

## 2013-12-13 DIAGNOSIS — E119 Type 2 diabetes mellitus without complications: Secondary | ICD-10-CM | POA: Diagnosis not present

## 2013-12-13 DIAGNOSIS — K219 Gastro-esophageal reflux disease without esophagitis: Secondary | ICD-10-CM | POA: Diagnosis not present

## 2013-12-13 DIAGNOSIS — N184 Chronic kidney disease, stage 4 (severe): Secondary | ICD-10-CM | POA: Diagnosis not present

## 2013-12-13 DIAGNOSIS — I129 Hypertensive chronic kidney disease with stage 1 through stage 4 chronic kidney disease, or unspecified chronic kidney disease: Secondary | ICD-10-CM | POA: Diagnosis not present

## 2013-12-13 DIAGNOSIS — J439 Emphysema, unspecified: Secondary | ICD-10-CM | POA: Diagnosis not present

## 2013-12-13 DIAGNOSIS — J45909 Unspecified asthma, uncomplicated: Secondary | ICD-10-CM | POA: Diagnosis not present

## 2013-12-13 DIAGNOSIS — N400 Enlarged prostate without lower urinary tract symptoms: Secondary | ICD-10-CM | POA: Diagnosis not present

## 2013-12-13 DIAGNOSIS — I252 Old myocardial infarction: Secondary | ICD-10-CM | POA: Diagnosis not present

## 2013-12-13 DIAGNOSIS — K449 Diaphragmatic hernia without obstruction or gangrene: Secondary | ICD-10-CM | POA: Diagnosis not present

## 2013-12-13 DIAGNOSIS — Q2739 Arteriovenous malformation, other site: Secondary | ICD-10-CM | POA: Diagnosis not present

## 2013-12-13 DIAGNOSIS — R042 Hemoptysis: Secondary | ICD-10-CM

## 2013-12-13 DIAGNOSIS — K589 Irritable bowel syndrome without diarrhea: Secondary | ICD-10-CM | POA: Diagnosis not present

## 2013-12-13 DIAGNOSIS — D649 Anemia, unspecified: Secondary | ICD-10-CM | POA: Diagnosis present

## 2013-12-13 DIAGNOSIS — Z950 Presence of cardiac pacemaker: Secondary | ICD-10-CM | POA: Diagnosis not present

## 2013-12-13 DIAGNOSIS — Z87891 Personal history of nicotine dependence: Secondary | ICD-10-CM | POA: Diagnosis not present

## 2013-12-13 DIAGNOSIS — Z7901 Long term (current) use of anticoagulants: Secondary | ICD-10-CM | POA: Diagnosis not present

## 2013-12-13 DIAGNOSIS — R195 Other fecal abnormalities: Secondary | ICD-10-CM | POA: Diagnosis present

## 2013-12-13 DIAGNOSIS — G40909 Epilepsy, unspecified, not intractable, without status epilepticus: Secondary | ICD-10-CM | POA: Diagnosis not present

## 2013-12-13 DIAGNOSIS — I509 Heart failure, unspecified: Secondary | ICD-10-CM | POA: Diagnosis not present

## 2013-12-13 DIAGNOSIS — Z79899 Other long term (current) drug therapy: Secondary | ICD-10-CM | POA: Diagnosis not present

## 2013-12-13 DIAGNOSIS — Z7982 Long term (current) use of aspirin: Secondary | ICD-10-CM | POA: Diagnosis not present

## 2013-12-13 DIAGNOSIS — M109 Gout, unspecified: Secondary | ICD-10-CM | POA: Diagnosis not present

## 2013-12-13 DIAGNOSIS — N289 Disorder of kidney and ureter, unspecified: Secondary | ICD-10-CM

## 2013-12-13 DIAGNOSIS — I2510 Atherosclerotic heart disease of native coronary artery without angina pectoris: Secondary | ICD-10-CM | POA: Diagnosis not present

## 2013-12-13 DIAGNOSIS — D638 Anemia in other chronic diseases classified elsewhere: Secondary | ICD-10-CM | POA: Diagnosis not present

## 2013-12-13 DIAGNOSIS — Z7952 Long term (current) use of systemic steroids: Secondary | ICD-10-CM | POA: Diagnosis not present

## 2013-12-13 DIAGNOSIS — E785 Hyperlipidemia, unspecified: Secondary | ICD-10-CM | POA: Diagnosis not present

## 2013-12-13 HISTORY — DX: Disorder of kidney and ureter, unspecified: N28.9

## 2013-12-13 HISTORY — DX: Unspecified convulsions: R56.9

## 2013-12-13 HISTORY — DX: Chronic kidney disease, stage 4 (severe): N18.4

## 2013-12-13 HISTORY — DX: Pneumonia, unspecified organism: J18.9

## 2013-12-13 HISTORY — DX: Acute myocardial infarction, unspecified: I21.9

## 2013-12-13 HISTORY — DX: Heart failure, unspecified: I50.9

## 2013-12-13 LAB — BASIC METABOLIC PANEL
Anion gap: 14 (ref 5–15)
BUN: 48 mg/dL — ABNORMAL HIGH (ref 6–23)
CHLORIDE: 96 meq/L (ref 96–112)
CO2: 25 meq/L (ref 19–32)
Calcium: 8.3 mg/dL — ABNORMAL LOW (ref 8.4–10.5)
Creatinine, Ser: 3.1 mg/dL — ABNORMAL HIGH (ref 0.50–1.35)
GFR calc Af Amer: 19 mL/min — ABNORMAL LOW (ref >90)
GFR calc non Af Amer: 16 mL/min — ABNORMAL LOW (ref >90)
GLUCOSE: 318 mg/dL — AB (ref 70–99)
POTASSIUM: 4.6 meq/L (ref 3.7–5.3)
SODIUM: 135 meq/L — AB (ref 137–147)

## 2013-12-13 LAB — CBC WITH DIFFERENTIAL/PLATELET
BASOS ABS: 0 10*3/uL (ref 0.0–0.1)
Basophils Relative: 0 % (ref 0–1)
Eosinophils Absolute: 0.1 10*3/uL (ref 0.0–0.7)
Eosinophils Relative: 1 % (ref 0–5)
HCT: 21.5 % — ABNORMAL LOW (ref 39.0–52.0)
Hemoglobin: 7 g/dL — ABNORMAL LOW (ref 13.0–17.0)
LYMPHS ABS: 0.8 10*3/uL (ref 0.7–4.0)
LYMPHS PCT: 11 % — AB (ref 12–46)
MCH: 32.4 pg (ref 26.0–34.0)
MCHC: 32.6 g/dL (ref 30.0–36.0)
MCV: 99.5 fL (ref 78.0–100.0)
Monocytes Absolute: 0.3 10*3/uL (ref 0.1–1.0)
Monocytes Relative: 4 % (ref 3–12)
NEUTROS ABS: 5.9 10*3/uL (ref 1.7–7.7)
NEUTROS PCT: 84 % — AB (ref 43–77)
PLATELETS: 183 10*3/uL (ref 150–400)
RBC: 2.16 MIL/uL — AB (ref 4.22–5.81)
RDW: 17.7 % — AB (ref 11.5–15.5)
WBC: 7 10*3/uL (ref 4.0–10.5)

## 2013-12-13 LAB — URINALYSIS, ROUTINE W REFLEX MICROSCOPIC
Bilirubin Urine: NEGATIVE
GLUCOSE, UA: 250 mg/dL — AB
Ketones, ur: NEGATIVE mg/dL
LEUKOCYTES UA: NEGATIVE
Nitrite: NEGATIVE
PH: 6.5 (ref 5.0–8.0)
Protein, ur: NEGATIVE mg/dL
Specific Gravity, Urine: 1.01 (ref 1.005–1.030)
Urobilinogen, UA: 0.2 mg/dL (ref 0.0–1.0)

## 2013-12-13 LAB — GLUCOSE, CAPILLARY
GLUCOSE-CAPILLARY: 290 mg/dL — AB (ref 70–99)
GLUCOSE-CAPILLARY: 270 mg/dL — AB (ref 70–99)

## 2013-12-13 LAB — PREPARE RBC (CROSSMATCH)

## 2013-12-13 LAB — URINE MICROSCOPIC-ADD ON

## 2013-12-13 LAB — LACTIC ACID, PLASMA: Lactic Acid, Venous: 2.4 mmol/L — ABNORMAL HIGH (ref 0.5–2.2)

## 2013-12-13 LAB — POC OCCULT BLOOD, ED: Fecal Occult Bld: POSITIVE — AB

## 2013-12-13 LAB — TROPONIN I

## 2013-12-13 MED ORDER — DONEPEZIL HCL 5 MG PO TABS
10.0000 mg | ORAL_TABLET | Freq: Every day | ORAL | Status: DC
  Administered 2013-12-13 – 2013-12-16 (×4): 10 mg via ORAL
  Filled 2013-12-13 (×4): qty 2

## 2013-12-13 MED ORDER — ACETAMINOPHEN 325 MG PO TABS
650.0000 mg | ORAL_TABLET | Freq: Four times a day (QID) | ORAL | Status: DC | PRN
  Administered 2013-12-17: 650 mg via ORAL
  Filled 2013-12-13: qty 2

## 2013-12-13 MED ORDER — TAMSULOSIN HCL 0.4 MG PO CAPS
0.4000 mg | ORAL_CAPSULE | Freq: Every day | ORAL | Status: DC
Start: 2013-12-13 — End: 2013-12-17
  Administered 2013-12-13 – 2013-12-16 (×4): 0.4 mg via ORAL
  Filled 2013-12-13 (×4): qty 1

## 2013-12-13 MED ORDER — PREDNISONE 10 MG PO TABS
10.0000 mg | ORAL_TABLET | Freq: Every day | ORAL | Status: AC
  Administered 2013-12-14 – 2013-12-15 (×2): 10 mg via ORAL
  Filled 2013-12-13 (×2): qty 1

## 2013-12-13 MED ORDER — PRAVASTATIN SODIUM 40 MG PO TABS
40.0000 mg | ORAL_TABLET | Freq: Every evening | ORAL | Status: DC
  Administered 2013-12-13 – 2013-12-16 (×4): 40 mg via ORAL
  Filled 2013-12-13 (×4): qty 1

## 2013-12-13 MED ORDER — ALLOPURINOL 100 MG PO TABS
100.0000 mg | ORAL_TABLET | Freq: Every morning | ORAL | Status: DC
  Administered 2013-12-14 – 2013-12-17 (×4): 100 mg via ORAL
  Filled 2013-12-13 (×4): qty 1

## 2013-12-13 MED ORDER — ACETAMINOPHEN 650 MG RE SUPP: 650.0000 mg | Freq: Four times a day (QID) | RECTAL | Status: DC | PRN

## 2013-12-13 MED ORDER — INSULIN ASPART 100 UNIT/ML ~~LOC~~ SOLN
0.0000 [IU] | Freq: Three times a day (TID) | SUBCUTANEOUS | Status: DC
  Administered 2013-12-14: 3 [IU] via SUBCUTANEOUS
  Administered 2013-12-14: 2 [IU] via SUBCUTANEOUS
  Administered 2013-12-15: 3 [IU] via SUBCUTANEOUS
  Administered 2013-12-15: 5 [IU] via SUBCUTANEOUS
  Administered 2013-12-16: 3 [IU] via SUBCUTANEOUS

## 2013-12-13 MED ORDER — POLYETHYLENE GLYCOL 3350 17 GM/SCOOP PO POWD: 17.0000 g | Freq: Every day | ORAL | Status: DC

## 2013-12-13 MED ORDER — NITROGLYCERIN 0.4 MG SL SUBL: 0.4000 mg | SUBLINGUAL_TABLET | SUBLINGUAL | Status: DC | PRN

## 2013-12-13 MED ORDER — SODIUM CHLORIDE 0.9 % IV SOLN: 10.0000 mL/h | Freq: Once | INTRAVENOUS | Status: AC

## 2013-12-13 MED ORDER — NYSTATIN 100000 UNIT/ML MT SUSP: 5.0000 mL | Freq: Four times a day (QID) | OROMUCOSAL | Status: DC | PRN

## 2013-12-13 MED ORDER — FAMOTIDINE 20 MG PO TABS
20.0000 mg | ORAL_TABLET | Freq: Two times a day (BID) | ORAL | Status: DC
  Administered 2013-12-13 – 2013-12-17 (×8): 20 mg via ORAL
  Filled 2013-12-13 (×8): qty 1

## 2013-12-13 MED ORDER — POLYETHYLENE GLYCOL 3350 17 G PO PACK
17.0000 g | PACK | Freq: Every day | ORAL | Status: DC
  Administered 2013-12-14 – 2013-12-17 (×4): 17 g via ORAL
  Filled 2013-12-13 (×5): qty 1

## 2013-12-13 MED ORDER — OXCARBAZEPINE 300 MG PO TABS
ORAL_TABLET | ORAL | Status: AC
  Filled 2013-12-13: qty 1

## 2013-12-13 MED ORDER — DOCUSATE SODIUM 100 MG PO CAPS: 100.0000 mg | ORAL_CAPSULE | Freq: Every day | ORAL | Status: DC | PRN

## 2013-12-13 MED ORDER — OXCARBAZEPINE 300 MG PO TABS
300.0000 mg | ORAL_TABLET | Freq: Two times a day (BID) | ORAL | Status: DC
Start: 2013-12-13 — End: 2013-12-17
  Administered 2013-12-13 – 2013-12-17 (×8): 300 mg via ORAL
  Filled 2013-12-13 (×10): qty 1

## 2013-12-13 MED ORDER — TIOTROPIUM BROMIDE MONOHYDRATE 18 MCG IN CAPS
18.0000 ug | ORAL_CAPSULE | Freq: Every day | RESPIRATORY_TRACT | Status: DC
  Filled 2013-12-13: qty 5

## 2013-12-13 MED ORDER — ALUM & MAG HYDROXIDE-SIMETH 200-200-20 MG/5ML PO SUSP: 30.0000 mL | Freq: Four times a day (QID) | ORAL | Status: DC | PRN

## 2013-12-13 MED ORDER — SODIUM CHLORIDE 0.9 % IV SOLN
INTRAVENOUS | Status: DC
  Administered 2013-12-13: 23:00:00 via INTRAVENOUS

## 2013-12-13 MED ORDER — MELATONIN 3 MG PO TABS: 3.0000 mg | ORAL_TABLET | Freq: Every day | ORAL | Status: DC

## 2013-12-13 MED ORDER — OXYBUTYNIN CHLORIDE 5 MG PO TABS
5.0000 mg | ORAL_TABLET | Freq: Two times a day (BID) | ORAL | Status: DC
  Administered 2013-12-13 – 2013-12-17 (×8): 5 mg via ORAL
  Filled 2013-12-13 (×8): qty 1

## 2013-12-13 MED ORDER — METOPROLOL TARTRATE 25 MG PO TABS
12.5000 mg | ORAL_TABLET | Freq: Two times a day (BID) | ORAL | Status: DC
  Administered 2013-12-13 – 2013-12-17 (×8): 12.5 mg via ORAL
  Filled 2013-12-13 (×8): qty 1

## 2013-12-13 MED ORDER — ISOSORBIDE MONONITRATE ER 30 MG PO TB24
15.0000 mg | ORAL_TABLET | Freq: Every day | ORAL | Status: DC
  Administered 2013-12-14 – 2013-12-17 (×4): 15 mg via ORAL
  Filled 2013-12-13 (×4): qty 1

## 2013-12-13 MED ORDER — CLOPIDOGREL BISULFATE 75 MG PO TABS
75.0000 mg | ORAL_TABLET | Freq: Every morning | ORAL | Status: DC
  Administered 2013-12-14 – 2013-12-16 (×3): 75 mg via ORAL
  Filled 2013-12-13 (×3): qty 1

## 2013-12-13 MED ORDER — ASPIRIN 81 MG PO CHEW
81.0000 mg | CHEWABLE_TABLET | Freq: Every day | ORAL | Status: DC
  Administered 2013-12-14 – 2013-12-17 (×4): 81 mg via ORAL
  Filled 2013-12-13 (×4): qty 1

## 2013-12-13 MED ORDER — LINACLOTIDE 145 MCG PO CAPS
145.0000 ug | ORAL_CAPSULE | Freq: Every day | ORAL | Status: DC
  Administered 2013-12-14 – 2013-12-17 (×4): 145 ug via ORAL
  Filled 2013-12-13 (×4): qty 1

## 2013-12-13 MED ORDER — TORSEMIDE 20 MG PO TABS
20.0000 mg | ORAL_TABLET | Freq: Every day | ORAL | Status: DC
  Administered 2013-12-14 – 2013-12-17 (×4): 20 mg via ORAL
  Filled 2013-12-13 (×4): qty 1

## 2013-12-13 NOTE — ED Provider Notes (Signed)
CSN: [id number]     Arrival date & time 12/13/13  1511 History   First MD Initiated Contact with Patient 12/13/13 1533     Chief Complaint  Patient presents with  . Weakness     HPI Pt was seen at 1540. Per pt and his family, c/o gradual onset and worsening of persistent generalized weakness for the past 2 months, worse over the past 2 weeks. Pt's family states pt's symptoms started after he was discharged from the NH s/p CVA. Pt's PMD checked his labs approximately 1-2 weeks ago and told him that "his RBC count was dropping and renal functions were higher." Pt has been unable to ambulate even short distances due to increasing generalized weakness, including being able to ambulate to the bathroom. Denies CP/palpitations, no SOB/cough, no abd pain, no N/V/D, no dysuria, no fevers, no falls, no syncope.    Past Medical History  Diagnosis Date  . Gout   . Stroke 1993, 1995    chronic balance issues  . Hyperlipidemia   . Emphysema   . Hypertension   . CAD (coronary artery disease)     stent 2006  . Seizure disorder   . GERD (gastroesophageal reflux disease)   . Cancer   . Asthma   . Syncope Jan. 1, 2014  . AAA (abdominal aortic aneurysm)   . Sinus node dysfunction     s/p PPM implant April 2015 (ST Jude)  . Pneumonia   . Myocardial infarction   . CHF (congestive heart failure)   . Seizures   . Renal disorder    Past Surgical History  Procedure Laterality Date  . Cardiac catheterization  08/23/2004    bare metal stenting of the proximal circumflex  . Colonoscopy      X 2  . Colonoscopy  11/01/2011    Procedure: COLONOSCOPY;  Surgeon: Rogene Houston, MD;  Location: [facility];  Service: Endoscopy;  Laterality: N/A;  100  . Eye surgery    . Nose surgery    . Pacemaker insertion  04-29-13    STJ Assurity dual chamber pacemaker implanted by Dr Rayann Heman for syncope and sinus node dysfunction   Family History  Problem Relation Age of Onset  . Emphysema Brother   . Heart  disease Sister   . Cancer Brother     lung  . Diabetes Brother   . Cancer Brother     throat  . Diabetes Brother   . Rheum arthritis Mother   . Arthritis Mother   . Rheum arthritis Father   . Colon cancer Neg Hx   . Diabetes    . Stroke Mother   . Heart attack    . Heart attack Sister    History  Substance Use Topics  . Smoking status: Former Smoker -- 0.30 packs/day for 50 years    Types: Cigarettes    Quit date: 01/15/1990  . Smokeless tobacco: Never Used  . Alcohol Use: No    Review of Systems ROS: Statement: All systems negative except as marked or noted in the HPI; Constitutional: Negative for fever and chills. +generalized weakness/fatigue.; ; Eyes: Negative for eye pain, redness and discharge. ; ; ENMT: Negative for ear pain, hoarseness, nasal congestion, sinus pressure and sore throat. ; ; Cardiovascular: Negative for chest pain, palpitations, diaphoresis, dyspnea and peripheral edema. ; ; Respiratory: Negative for cough, wheezing and stridor. ; ; Gastrointestinal: Negative for nausea, vomiting, diarrhea, abdominal pain, blood in stool, hematemesis, jaundice and rectal bleeding. . ; ;  Genitourinary: Negative for dysuria, flank pain and hematuria. ; ; Musculoskeletal: Negative for back pain and neck pain. Negative for swelling and trauma.; ; Skin: Negative for pruritus, rash, abrasions, blisters, bruising and skin lesion.; ; Neuro: Negative for headache, lightheadedness and neck stiffness. Negative for altered level of consciousness , altered mental status, extremity weakness, paresthesias, involuntary movement, seizure and syncope.      Allergies  Penicillins  Home Medications   Prior to Admission medications   Medication Sig Start Date End Date Taking? Authorizing Provider  allopurinol (ZYLOPRIM) 100 MG tablet Take 1 tablet (100 mg total) by mouth every morning. 10/28/13  Yes Asencion Noble, MD  aspirin 81 MG chewable tablet Chew 1 tablet (81 mg total) by mouth daily.  10/28/13  Yes Asencion Noble, MD  clopidogrel (PLAVIX) 75 MG tablet Take 75 mg by mouth every morning.    Yes Historical Provider, MD  donepezil (ARICEPT) 10 MG tablet Take 10 mg by mouth at bedtime.    Yes Historical Provider, MD  famotidine (PEPCID) 20 MG tablet TAKE ONE TABLET BY MOUTH TWICE DAILY.   Yes Elsie Stain, MD  isosorbide mononitrate (IMDUR) 30 MG 24 hr tablet Take 0.5 tablets (15 mg total) by mouth daily. 08/26/13  Yes Scott T Weaver, PA-C  Linaclotide (LINZESS) 145 MCG CAPS capsule Take 1 capsule (145 mcg total) by mouth daily. 08/03/13  Yes Butch Penny, NP  Melatonin ([employer]S MELATONIN) 3 MG TABS Take 3 mg by mouth at bedtime.   Yes Historical Provider, MD  metoprolol tartrate (LOPRESSOR) 25 MG tablet Take 0.5 tablets (12.5 mg total) by mouth 2 (two) times daily. 09/24/13  Yes Arnoldo Lenis, MD  nitroGLYCERIN (NITROSTAT) 0.4 MG SL tablet Place 1 tablet (0.4 mg total) under the tongue every 5 (five) minutes as needed for chest pain. 1 tablet under tongue at onset of chest pain;you may repeat every 5 minutes for up to 3 doses. 10/03/12  Yes Herminio Commons, MD  nystatin (MYCOSTATIN) 100000 UNIT/ML suspension Use as directed 5-10 mLs in the mouth or throat 4 (four) times daily as needed (as directed).  11/30/13  Yes Historical Provider, MD  Oxcarbazepine (TRILEPTAL) 300 MG tablet Take 300 mg by mouth 2 (two) times daily.    Yes Historical Provider, MD  oxybutynin (DITROPAN) 5 MG tablet Take 5 mg by mouth 2 (two) times daily.    Yes Historical Provider, MD  polyethylene glycol powder (GLYCOLAX/MIRALAX) powder Take 17 g by mouth daily. *Mixed in 4-8 ounces of liquid and drink daily   Yes Historical Provider, MD  pravastatin (PRAVACHOL) 40 MG tablet Take 40 mg by mouth every evening.    Yes Historical Provider, MD  predniSONE (DELTASONE) 10 MG tablet Take 10 mg by mouth daily. 11/27/13  Yes Historical Provider, MD  Tamsulosin HCl (FLOMAX) 0.4 MG CAPS Take 0.4 mg by mouth at bedtime.     Yes Historical Provider, MD  tiotropium (SPIRIVA HANDIHALER) 18 MCG inhalation capsule Place 1 capsule (18 mcg total) into inhaler and inhale daily. 03/06/13  Yes Elsie Stain, MD  torsemide (DEMADEX) 20 MG tablet Take 1 tablet (20 mg total) by mouth daily. 10/28/13  Yes Asencion Noble, MD  VITAMIN B1-B12 IM Inject into the muscle every 30 (thirty) days. Once a month   Yes Historical Provider, MD  levofloxacin (LEVAQUIN) 500 MG tablet Take 1 tablet (500 mg total) by mouth every other day. Patient not taking: Reported on 12/13/2013 10/28/13   Asencion Noble, MD  predniSONE (  DELTASONE) 20 MG tablet Take 2 tablets (40 mg total) by mouth daily with breakfast. Patient not taking: Reported on 12/13/2013 10/28/13   Asencion Noble, MD   BP 106/55 mmHg  Pulse 87  Temp(Src) 98.2 F (36.8 C) (Oral)  Resp 15  Ht 5\' 11"  (1.803 m)  Wt 170 lb (77.111 kg)  BMI 23.72 kg/m2  SpO2 97% Physical Exam  1545: Physical examination:  Nursing notes reviewed; Vital signs and O2 SAT reviewed;  Constitutional: Well developed, Well nourished, In no acute distress; Head:  Normocephalic, atraumatic; Eyes: EOMI, PERRL, No scleral icterus; ENMT: Mouth and pharynx normal, Mucous membranes dry; Neck: Supple, Full range of motion, No lymphadenopathy; Cardiovascular: Regular rate and rhythm, No gallop; Respiratory: Breath sounds clear & equal bilaterally, No wheezes.  Speaking full sentences with ease, Normal respiratory effort/excursion; Chest: Nontender, Movement normal; Abdomen: Soft, Nontender, Nondistended, Normal bowel sounds; Genitourinary: No CVA tenderness; Extremities: Pulses normal, No tenderness, No edema, No calf edema or asymmetry.; Neuro: AA&Ox3, Major CN grossly intact.  Speech clear. No gross focal motor or sensory deficits in extremities.; Skin: Color pale, Warm, Dry.   ED Course  Procedures     EKG Interpretation   Date/Time:  Sunday December 13 2013 15:20:52 EST Ventricular Rate:  88 PR Interval:  269 QRS  Duration: 122 QT Interval:  377 QTC Calculation: 456 R Axis:   -82 Text Interpretation:  Sinus rhythm Prolonged PR interval Nonspecific IVCD  with LAD Left ventricular hypertrophy Anterior infarct, old Nonspecific T  abnormalities, lateral leads When compared with ECG of 10/18/2013 No  significant change was found Confirmed by [facility]  MD, Nunzio Cory [phone])  on 12/13/2013 4:02:37 PM      MDM  MDM Reviewed: previous chart, nursing note and vitals Reviewed previous: labs and ECG Interpretation: labs, ECG and x-ray Total time providing critical care: 30-74 minutes. This excludes time spent performing separately reportable procedures and services. Consults: admitting MD   CRITICAL CARE Performed by: Alfonzo Feller Total critical care time: 35 Critical care time was exclusive of separately billable procedures and treating other patients. Critical care was necessary to treat or prevent imminent or life-threatening deterioration. Critical care was time spent personally by me on the following activities: development of treatment plan with patient and/or surrogate as well as nursing, discussions with consultants, evaluation of patient's response to treatment, examination of patient, obtaining history from patient or surrogate, ordering and performing treatments and interventions, ordering and review of laboratory studies, ordering and review of radiographic studies, pulse oximetry and re-evaluation of patient's condition.    Results for orders placed or performed during the hospital encounter of 12/13/13  CBC with Differential  Result Value Ref Range   WBC 7.0 4.0 - 10.5 K/uL   RBC 2.16 (L) 4.22 - 5.81 MIL/uL   Hemoglobin 7.0 (L) 13.0 - 17.0 g/dL   HCT 21.5 (L) 39.0 - 52.0 %   MCV 99.5 78.0 - 100.0 fL   MCH 32.4 26.0 - 34.0 pg   MCHC 32.6 30.0 - 36.0 g/dL   RDW 17.7 (H) 11.5 - 15.5 %   Platelets 183 150 - 400 K/uL   Neutrophils Relative % 84 (H) 43 - 77 %   Neutro Abs 5.9 1.7 -  7.7 K/uL   Lymphocytes Relative 11 (L) 12 - 46 %   Lymphs Abs 0.8 0.7 - 4.0 K/uL   Monocytes Relative 4 3 - 12 %   Monocytes Absolute 0.3 0.1 - 1.0 K/uL   Eosinophils Relative 1 0 -  5 %   Eosinophils Absolute 0.1 0.0 - 0.7 K/uL   Basophils Relative 0 0 - 1 %   Basophils Absolute 0.0 0.0 - 0.1 K/uL  Basic metabolic panel  Result Value Ref Range   Sodium 135 (L) 137 - 147 mEq/L   Potassium 4.6 3.7 - 5.3 mEq/L   Chloride 96 96 - 112 mEq/L   CO2 25 19 - 32 mEq/L   Glucose, Bld 318 (H) 70 - 99 mg/dL   BUN 48 (H) 6 - 23 mg/dL   Creatinine, Ser 3.10 (H) 0.50 - 1.35 mg/dL   Calcium 8.3 (L) 8.4 - 10.5 mg/dL   GFR calc non Af Amer 16 (L) >90 mL/min   GFR calc Af Amer 19 (L) >90 mL/min   Anion gap 14 5 - 15  Urinalysis, Routine w reflex microscopic  Result Value Ref Range   Color, Urine YELLOW YELLOW   APPearance CLEAR CLEAR   Specific Gravity, Urine 1.010 1.005 - 1.030   pH 6.5 5.0 - 8.0   Glucose, UA 250 (A) NEGATIVE mg/dL   Hgb urine dipstick TRACE (A) NEGATIVE   Bilirubin Urine NEGATIVE NEGATIVE   Ketones, ur NEGATIVE NEGATIVE mg/dL   Protein, ur NEGATIVE NEGATIVE mg/dL   Urobilinogen, UA 0.2 0.0 - 1.0 mg/dL   Nitrite NEGATIVE NEGATIVE   Leukocytes, UA NEGATIVE NEGATIVE  Troponin I  Result Value Ref Range   Troponin I <0.30 <0.30 ng/mL  Lactic acid, plasma  Result Value Ref Range   Lactic Acid, Venous 2.4 (H) 0.5 - 2.2 mmol/L  Urine microscopic-add on  Result Value Ref Range   RBC / HPF 0-2 <3 RBC/hpf   Dg Chest 2 View 12/13/2013   CLINICAL DATA:  Recent stroke with residual weakness.  EXAM: CHEST  2 VIEW  COMPARISON:  10/18/2013  FINDINGS: Stable position of the left cardiac dual chamber pacemaker. The lungs are clear. Heart size is mildly enlarged. The trachea is midline. Few densities in the left lower chest could represent atelectasis.  IMPRESSION: No acute chest findings.   Electronically Signed   By: Markus Daft M.D.   On: 12/13/2013 16:32    Results for ZACKARIE, CHASON  (MRN [id number]) as of 12/13/2013 17:56  Ref. Range 10/25/2013 06:30 10/27/2013 06:07 12/13/2013 15:29  BUN Latest Range: 6-23 mg/dL 81 (H) 103 (H) 48 (H)  Creatinine Latest Range: 0.50-1.35 mg/dL 4.31 (H) 4.47 (H) 3.10 (H)   Results for LINDSAY, SOULLIERE (MRN [id number]) as of 12/13/2013 17:56  Ref. Range 10/23/2013 06:25 10/23/2013 23:17 10/24/2013 05:52 10/25/2013 06:30 12/13/2013 15:29  Hemoglobin Latest Range: 13.0-17.0 g/dL 7.3 (L) 8.9 (L) 8.8 (L) 9.3 (L) 7.0 (L)  HCT Latest Range: 39.0-52.0 % 20.4 (L) 25.3 (L) 25.5 (L) 26.0 (L) 21.5 (L)    1730:  BUN/Cr per baseline. H/H lower than previous and pt is symptomatic when standing; will transfuse PRBC's. Dx and testing d/w pt and family.  Questions answered.  Verb understanding, agreeable to admit.  T/C to [facility] Dr. Nehemiah Settle, case discussed, including:  HPI, pertinent PM/SHx, VS/PE, dx testing, ED course and treatment:  Agreeable to admit, requests to write temporary orders, obtain observation medical bed to team [facility].   Francine Graven, DO 12/14/13 1710

## 2013-12-13 NOTE — Progress Notes (Signed)

## 2013-12-13 NOTE — ED Notes (Signed)
EMS reports that pt had a stroke 2 months ago and has residual over all weakness.  Reports was in [facility] for rehab and was doing well.  Reports for the past 2 weeks has had a noticeable steady decline.  Reports generalized weakness.  CBG 340 per ems and reports no history of diabetes.    Pt alert, oriented.  Pt denies any pain.  Saw Dr. Willey Blade Wednesday and had blood work recently.

## 2013-12-13 NOTE — H&P (Signed)
History and Physical  [name] DOB: 1921/06/09 DOA: 12/13/2013  Referring physician: Dr Thurnell Garbe, ED physician PCP: Asencion Noble, MD   Chief Complaint: Fatigue  HPI: [name] is a 80 y.o. male  With a history of diabetes, hypertension, coronary artery disease, COPD with emphysema, recent lacunar infarct on Plavix and aspirin, severe anemia of chronic disease, chronic renal disease. He was recently hospitalized at the beginning of October for lacunar infarcts. During that hospitalization, his hemoglobin was 7.3, necessitating a blood transfusion. His hemoglobin went to 9.3 and has been steadily declining since that time. He now is very fatigued and has difficulty ambulating even short distances. His fatigue is constant and worse with walking. Resting improves his fatigue briefly.   Review of Systems:   Pt denies any fevers, chills, nausea, vomiting, rectal bleeding, melena, abdominal pain, palpitations, shortness of breath, chest pain, abdominal pain, weight gain, weight loss, night sweats.  Review of systems are otherwise negative  Past Medical History  Diagnosis Date  . Gout   . Stroke 1993, 1995    chronic balance issues  . Hyperlipidemia   . Emphysema   . Hypertension   . CAD (coronary artery disease)     stent 2006  . Seizure disorder   . GERD (gastroesophageal reflux disease)   . Cancer   . Asthma   . Syncope Jan. 1, 2014  . AAA (abdominal aortic aneurysm)   . Sinus node dysfunction     s/p PPM implant April 2015 (ST Jude)  . Pneumonia   . Myocardial infarction   . CHF (congestive heart failure)   . Seizures   . Renal disorder    Past Surgical History  Procedure Laterality Date  . Cardiac catheterization  08/23/2004    bare metal stenting of the proximal circumflex  . Colonoscopy      X 2  . Colonoscopy  11/01/2011    Procedure: COLONOSCOPY;  Surgeon: Rogene Houston, MD;  Location: [facility];  Service: Endoscopy;  Laterality: N/A;  100  .  Eye surgery    . Nose surgery    . Pacemaker insertion  04-29-13    STJ Assurity dual chamber pacemaker implanted by Dr Rayann Heman for syncope and sinus node dysfunction   Social History:  reports that he quit smoking about 23 years ago. His smoking use included Cigarettes. He has a 15 pack-year smoking history. He has never used smokeless tobacco. He reports that he does not drink alcohol or use illicit drugs. Patient lives at home & is able to participate in activities of daily living with assistance  Allergies  Allergen Reactions  . Penicillins Itching    Family History  Problem Relation Age of Onset  . Emphysema Brother   . Heart disease Sister   . Cancer Brother     lung  . Diabetes Brother   . Cancer Brother     throat  . Diabetes Brother   . Rheum arthritis Mother   . Arthritis Mother   . Rheum arthritis Father   . Colon cancer Neg Hx   . Diabetes    . Stroke Mother   . Heart attack    . Heart attack Sister      Prior to Admission medications   Medication Sig Start Date End Date Taking? Authorizing Provider  allopurinol (ZYLOPRIM) 100 MG tablet Take 1 tablet (100 mg total) by mouth every morning. 10/28/13  Yes Asencion Noble, MD  aspirin 81 MG chewable tablet Chew  1 tablet (81 mg total) by mouth daily. 10/28/13  Yes Asencion Noble, MD  clopidogrel (PLAVIX) 75 MG tablet Take 75 mg by mouth every morning.    Yes Historical Provider, MD  donepezil (ARICEPT) 10 MG tablet Take 10 mg by mouth at bedtime.    Yes Historical Provider, MD  famotidine (PEPCID) 20 MG tablet TAKE ONE TABLET BY MOUTH TWICE DAILY.   Yes Elsie Stain, MD  isosorbide mononitrate (IMDUR) 30 MG 24 hr tablet Take 0.5 tablets (15 mg total) by mouth daily. 08/26/13  Yes Scott T Weaver, PA-C  Linaclotide (LINZESS) 145 MCG CAPS capsule Take 1 capsule (145 mcg total) by mouth daily. 08/03/13  Yes Butch Penny, NP  Melatonin ([employer]S MELATONIN) 3 MG TABS Take 3 mg by mouth at bedtime.   Yes Historical Provider, MD    metoprolol tartrate (LOPRESSOR) 25 MG tablet Take 0.5 tablets (12.5 mg total) by mouth 2 (two) times daily. 09/24/13  Yes Arnoldo Lenis, MD  nitroGLYCERIN (NITROSTAT) 0.4 MG SL tablet Place 1 tablet (0.4 mg total) under the tongue every 5 (five) minutes as needed for chest pain. 1 tablet under tongue at onset of chest pain;you may repeat every 5 minutes for up to 3 doses. 10/03/12  Yes Herminio Commons, MD  nystatin (MYCOSTATIN) 100000 UNIT/ML suspension Use as directed 5-10 mLs in the mouth or throat 4 (four) times daily as needed (as directed).  11/30/13  Yes Historical Provider, MD  Oxcarbazepine (TRILEPTAL) 300 MG tablet Take 300 mg by mouth 2 (two) times daily.    Yes Historical Provider, MD  oxybutynin (DITROPAN) 5 MG tablet Take 5 mg by mouth 2 (two) times daily.    Yes Historical Provider, MD  polyethylene glycol powder (GLYCOLAX/MIRALAX) powder Take 17 g by mouth daily. *Mixed in 4-8 ounces of liquid and drink daily   Yes Historical Provider, MD  pravastatin (PRAVACHOL) 40 MG tablet Take 40 mg by mouth every evening.    Yes Historical Provider, MD  predniSONE (DELTASONE) 10 MG tablet Take 10 mg by mouth daily. 11/27/13  Yes Historical Provider, MD  Tamsulosin HCl (FLOMAX) 0.4 MG CAPS Take 0.4 mg by mouth at bedtime.    Yes Historical Provider, MD  tiotropium (SPIRIVA HANDIHALER) 18 MCG inhalation capsule Place 1 capsule (18 mcg total) into inhaler and inhale daily. 03/06/13  Yes Elsie Stain, MD  torsemide (DEMADEX) 20 MG tablet Take 1 tablet (20 mg total) by mouth daily. 10/28/13  Yes Asencion Noble, MD  VITAMIN B1-B12 IM Inject into the muscle every 30 (thirty) days. Once a month   Yes Historical Provider, MD  levofloxacin (LEVAQUIN) 500 MG tablet Take 1 tablet (500 mg total) by mouth every other day. Patient not taking: Reported on 12/13/2013 10/28/13   Asencion Noble, MD  predniSONE (DELTASONE) 20 MG tablet Take 2 tablets (40 mg total) by mouth daily with breakfast. Patient not taking:  Reported on 12/13/2013 10/28/13   Asencion Noble, MD    Physical Exam: BP 106/55 mmHg  Pulse 87  Temp(Src) 98.2 F (36.8 C) (Oral)  Resp 15  Ht 5\' 11"  (1.803 m)  Wt 77.111 kg (170 lb)  BMI 23.72 kg/m2  SpO2 97%  General: Elderly Caucasian male. Awake and alert and oriented x3. No acute cardiopulmonary distress.  Eyes: Pupils equal, round, reactive to light. Extraocular muscles are intact. Sclerae anicteric and noninjected. Conjunctiva pale ENT: Moist mucosal membranes. No mucosal lesions.  Neck: Neck supple without lymphadenopathy. No carotid bruits. No masses  palpated.  Cardiovascular: Regular rate with normal S1-S2 sounds. No murmurs, rubs, gallops auscultated. No JVD.  Respiratory: Good respiratory effort with no wheezes, rales, rhonchi. Lungs clear to auscultation bilaterally.  Abdomen: Soft, nontender, nondistended. Active bowel sounds. No masses or hepatosplenomegaly  Rectal: Hemoccult-positive Skin: Dry, warm to touch. 2+ dorsalis pedis and radial pulses. Musculoskeletal: No calf or leg pain. All major joints not erythematous nontender.  Psychiatric: Intact judgment and insight.  Neurologic: No focal neurological deficits. Cranial nerves II through XII are grossly intact.           Labs on Admission:  Basic Metabolic Panel:  Recent Labs Lab 12/13/13 1529  NA 135*  K 4.6  CL 96  CO2 25  GLUCOSE 318*  BUN 48*  CREATININE 3.10*  CALCIUM 8.3*   Liver Function Tests: No results for input(s): AST, ALT, ALKPHOS, BILITOT, PROT, ALBUMIN in the last 168 hours. No results for input(s): LIPASE, AMYLASE in the last 168 hours. No results for input(s): AMMONIA in the last 168 hours. CBC:  Recent Labs Lab 12/13/13 1529  WBC 7.0  NEUTROABS 5.9  HGB 7.0*  HCT 21.5*  MCV 99.5  PLT 183   Cardiac Enzymes:  Recent Labs Lab 12/13/13 1529  TROPONINI <0.30    BNP (last 3 results)  Recent Labs  04/17/13 2008  PROBNP 3970.0*   CBG: No results for input(s): GLUCAP  in the last 168 hours.  Radiological Exams on Admission: Dg Chest 2 View  12/13/2013   CLINICAL DATA:  Recent stroke with residual weakness.  EXAM: CHEST  2 VIEW  COMPARISON:  10/18/2013  FINDINGS: Stable position of the left cardiac dual chamber pacemaker. The lungs are clear. Heart size is mildly enlarged. The trachea is midline. Few densities in the left lower chest could represent atelectasis.  IMPRESSION: No acute chest findings.   Electronically Signed   By: Markus Daft M.D.   On: 12/13/2013 16:32    EKG: Independently reviewed. Sinus rhythm with a normal rate. Q waves in V1 and V2 V3 consistent with old anterior infarct. Prolonged PR interval  Assessment/Plan Present on Admission:  . Symptomatic anemia . Occult GI bleeding  #1 symptomatic anemia Admit the patient for observation for blood transfusion. I had a long conversation with the patient regarding prognosis, particularly as his hemoglobin has dropped to 7 again. While his primary doctor has referred him to nephrology for question of erythropoietin administration, I discussed with the patient's that blood transfusions do not act the same as native blood, nor do the effects last as long. I had a conversation with patient regarding hospice and the possibility of palliative care. The patient does wish to be comfortable and at home with family. Therefore, I will consult the palliative care team to have a conversation with the patient regarding hospice to see whether this will meet his goals for care.  At the same time, patient did have a Hemoccult positive rectal exam. I will consult GI.  I will continue the patient on Plavix and aspirin for now, given the risk of having a subsequent stroke is fairly large.  #2 occult GI bleeding Consult GI  #3 type 2 diabetes Sliding scale insulin for the patient  #4 recent stroke Continue Plavix and aspirin  #5 chronic kidney disease Continue to observe   DVT prophylaxis: SCDs  Consultants:  GI, palliative care  Code Status: DO NOT RESUSCITATE  Family Communication: Daughter and son in the room   Disposition Plan: Pending palliative care  consult  Time spent: 70 minutes with greater than 50% of the time spent with conversation with the patient and the family in developing care plan.  Loma Boston, DO [facility] Pager [phone]

## 2013-12-13 NOTE — ED Notes (Signed)
Hospitalist at bedside 

## 2013-12-13 NOTE — ED Notes (Signed)
Pt is unable to stand at this time for orthostatics

## 2013-12-14 ENCOUNTER — Ambulatory Visit: Payer: Medicare Other | Admitting: Critical Care Medicine

## 2013-12-14 DIAGNOSIS — D649 Anemia, unspecified: Secondary | ICD-10-CM

## 2013-12-14 DIAGNOSIS — D638 Anemia in other chronic diseases classified elsewhere: Secondary | ICD-10-CM | POA: Diagnosis not present

## 2013-12-14 LAB — CBC
HCT: 26.8 % — ABNORMAL LOW (ref 39.0–52.0)
Hemoglobin: 9.1 g/dL — ABNORMAL LOW (ref 13.0–17.0)
MCH: 32.2 pg (ref 26.0–34.0)
MCHC: 34 g/dL (ref 30.0–36.0)
MCV: 94.7 fL (ref 78.0–100.0)
PLATELETS: 168 10*3/uL (ref 150–400)
RBC: 2.83 MIL/uL — AB (ref 4.22–5.81)
RDW: 18.9 % — AB (ref 11.5–15.5)
WBC: 9.1 10*3/uL (ref 4.0–10.5)

## 2013-12-14 LAB — BASIC METABOLIC PANEL
ANION GAP: 16 — AB (ref 5–15)
BUN: 48 mg/dL — ABNORMAL HIGH (ref 6–23)
CHLORIDE: 99 meq/L (ref 96–112)
CO2: 24 mEq/L (ref 19–32)
Calcium: 8.5 mg/dL (ref 8.4–10.5)
Creatinine, Ser: 2.94 mg/dL — ABNORMAL HIGH (ref 0.50–1.35)
GFR calc Af Amer: 20 mL/min — ABNORMAL LOW (ref >90)
GFR calc non Af Amer: 17 mL/min — ABNORMAL LOW (ref >90)
Glucose, Bld: 124 mg/dL — ABNORMAL HIGH (ref 70–99)
POTASSIUM: 4.2 meq/L (ref 3.7–5.3)
SODIUM: 139 meq/L (ref 137–147)

## 2013-12-14 LAB — GLUCOSE, CAPILLARY
GLUCOSE-CAPILLARY: 121 mg/dL — AB (ref 70–99)
Glucose-Capillary: 144 mg/dL — ABNORMAL HIGH (ref 70–99)
Glucose-Capillary: 194 mg/dL — ABNORMAL HIGH (ref 70–99)

## 2013-12-14 LAB — HEMOGLOBIN A1C
HEMOGLOBIN A1C: 7.4 % — AB (ref <5.7)
MEAN PLASMA GLUCOSE: 166 mg/dL — AB (ref <117)

## 2013-12-14 NOTE — Progress Notes (Signed)
Nutrition Brief Note  Chart reviewed. Pt now transitioning to comfort care.  No further nutrition interventions warranted at this time.  Please re-consult as needed.   Elanna Bert A. Zigmond Trela, RD, LDN Pager: [phone] After hours Pager: [phone]  

## 2013-12-14 NOTE — Consult Note (Signed)
Referring Provider:  Asencion Noble, MD Primary Care Physician:  Asencion Noble, MD Primary Gastroenterologist:  Dr. Laural Golden  Reason for Consultation:    Anemia and heme-positive stool.  HPI:   Patient is a 80 year old Caucasian male who has multiple medical problems who presented to emergency room yesterday with progressive weakness and was noted to have hemoglobin of 7 g and stool was guaiac positive. He was therefore admitted for transfusion and further evaluation. Patient was hospitalized from 10/18/2013 through 10/28/2013 for speech impairment secondary to acute lacunar infarct, pneumonia and he was noted to be anemic. His hemoglobin dropped to 7.3 g and he received 2 units of PRBCs. Predischarge hemoglobin was 9.3 g. Patient also noted to have creatinine of over 5 and improved prior to discharge. Patient was transferred to [facility] in [place] for rehabilitation and he was able to come home 2 weeks ago. He had lab studies by Dr. Willey Blade 5 days ago and his hemoglobin was around 8-1/2 g. In the last week patient has noted progressive weakness and just did not feel well. He does report a few episodes of black stools. Patient states heart tones well controlled with therapy. There is no history of rectal bleeding abdominal pain nausea vomiting or hematemesis and he also denies dysphagia. Patient has been on Plavix chronically because of history of CVA and low dose aspirin was added during recent hospitalization. Patient has lost about 10 pounds in the last 2 months but this weight loss is felt to be due to fluid loss. His appetite is fair to normal. Patient lives alone but his daughter Mikle Bosworth lives next door and he has help throat the day. He also has [employer]. Patient's last colonoscopy was in October 2013 for change in bowel habits and revealed diverticulosis but no polyps or AV malformation.   Past Medical History  Diagnosis Date  . Gout   . Stroke 1993, 1995    chronic  balance issues  . Hyperlipidemia   . Emphysema   . Hypertension   . CAD (coronary artery disease)     stent 2006  . Seizure disorder   . GERD (gastroesophageal reflux disease)   . Cancer   . Asthma   . Syncope Jan. 1, 2014  . AAA (abdominal aortic aneurysm)   . Sinus node dysfunction     s/p PPM implant April 2015 (ST Jude)  . Pneumonia   . Myocardial infarction   . CHF (congestive heart failure)   . Seizures   . Renal disorder     Past Surgical History  Procedure Laterality Date  . Cardiac catheterization  08/23/2004    bare metal stenting of the proximal circumflex  . Colonoscopy      X 2  . Colonoscopy  11/01/2011    Procedure: COLONOSCOPY;  Surgeon: Rogene Houston, MD;  Location: [facility];  Service: Endoscopy;  Laterality: N/A;  100  . Eye surgery    . Nose surgery    . Pacemaker insertion  04-29-13    STJ Assurity dual chamber pacemaker implanted by Dr Rayann Heman for syncope and sinus node dysfunction    Prior to Admission medications   Medication Sig Start Date End Date Taking? Authorizing Provider  allopurinol (ZYLOPRIM) 100 MG tablet Take 1 tablet (100 mg total) by mouth every morning. 10/28/13  Yes Asencion Noble, MD  aspirin 81 MG chewable tablet Chew 1 tablet (81 mg total) by mouth daily. 10/28/13  Yes Asencion Noble, MD  clopidogrel (PLAVIX) 75 MG tablet  Take 75 mg by mouth every morning.    Yes Historical Provider, MD  donepezil (ARICEPT) 10 MG tablet Take 10 mg by mouth at bedtime.    Yes Historical Provider, MD  famotidine (PEPCID) 20 MG tablet TAKE ONE TABLET BY MOUTH TWICE DAILY.   Yes Elsie Stain, MD  isosorbide mononitrate (IMDUR) 30 MG 24 hr tablet Take 0.5 tablets (15 mg total) by mouth daily. 08/26/13  Yes Scott T Weaver, PA-C  Linaclotide (LINZESS) 145 MCG CAPS capsule Take 1 capsule (145 mcg total) by mouth daily. 08/03/13  Yes Butch Penny, NP  Melatonin ([facility] MELATONIN) 3 MG TABS Take 3 mg by mouth at bedtime.   Yes Historical Provider, MD  metoprolol  tartrate (LOPRESSOR) 25 MG tablet Take 0.5 tablets (12.5 mg total) by mouth 2 (two) times daily. 09/24/13  Yes Arnoldo Lenis, MD  nitroGLYCERIN (NITROSTAT) 0.4 MG SL tablet Place 1 tablet (0.4 mg total) under the tongue every 5 (five) minutes as needed for chest pain. 1 tablet under tongue at onset of chest pain;you may repeat every 5 minutes for up to 3 doses. 10/03/12  Yes Herminio Commons, MD  nystatin (MYCOSTATIN) 100000 UNIT/ML suspension Use as directed 5-10 mLs in the mouth or throat 4 (four) times daily as needed (as directed).  11/30/13  Yes Historical Provider, MD  Oxcarbazepine (TRILEPTAL) 300 MG tablet Take 300 mg by mouth 2 (two) times daily.    Yes Historical Provider, MD  oxybutynin (DITROPAN) 5 MG tablet Take 5 mg by mouth 2 (two) times daily.    Yes Historical Provider, MD  polyethylene glycol powder (GLYCOLAX/MIRALAX) powder Take 17 g by mouth daily. *Mixed in 4-8 ounces of liquid and drink daily   Yes Historical Provider, MD  pravastatin (PRAVACHOL) 40 MG tablet Take 40 mg by mouth every evening.    Yes Historical Provider, MD  predniSONE (DELTASONE) 10 MG tablet Take 10 mg by mouth daily. 11/27/13  Yes Historical Provider, MD  Tamsulosin HCl (FLOMAX) 0.4 MG CAPS Take 0.4 mg by mouth at bedtime.    Yes Historical Provider, MD  tiotropium (SPIRIVA HANDIHALER) 18 MCG inhalation capsule Place 1 capsule (18 mcg total) into inhaler and inhale daily. 03/06/13  Yes Elsie Stain, MD  torsemide (DEMADEX) 20 MG tablet Take 1 tablet (20 mg total) by mouth daily. 10/28/13  Yes Asencion Noble, MD  VITAMIN B1-B12 IM Inject into the muscle every 30 (thirty) days. Once a month   Yes Historical Provider, MD  levofloxacin (LEVAQUIN) 500 MG tablet Take 1 tablet (500 mg total) by mouth every other day. Patient not taking: Reported on 12/13/2013 10/28/13   Asencion Noble, MD  predniSONE (DELTASONE) 20 MG tablet Take 2 tablets (40 mg total) by mouth daily with breakfast. Patient not taking: Reported on  12/13/2013 10/28/13   Asencion Noble, MD    Current Facility-Administered Medications  Medication Dose Route Frequency Provider Last Rate Last Dose  . acetaminophen (TYLENOL) tablet 650 mg  650 mg Oral Q6H PRN Truett Mainland, DO       Or  . acetaminophen (TYLENOL) suppository 650 mg  650 mg Rectal Q6H PRN Tanna Savoy Stinson, DO      . allopurinol (ZYLOPRIM) tablet 100 mg  100 mg Oral q morning - 10a Tanna Savoy Stinson, DO   100 mg at 12/14/13 1155  . alum & mag hydroxide-simeth (MAALOX/MYLANTA) 200-200-20 MG/5ML suspension 30 mL  30 mL Oral Q6H PRN Tanna Savoy Stinson, DO      .  aspirin chewable tablet 81 mg  81 mg Oral Daily Tanna Savoy Stinson, DO   81 mg at 12/14/13 1153  . clopidogrel (PLAVIX) tablet 75 mg  75 mg Oral q morning - 10a Tanna Savoy Stinson, DO   75 mg at 12/14/13 1154  . docusate sodium (COLACE) capsule 100 mg  100 mg Oral Daily PRN Tanna Savoy Stinson, DO      . donepezil (ARICEPT) tablet 10 mg  10 mg Oral QHS Tanna Savoy Stinson, DO   10 mg at 12/13/13 2255  . famotidine (PEPCID) tablet 20 mg  20 mg Oral BID Tanna Savoy Stinson, DO   20 mg at 12/14/13 1154  . insulin aspart (novoLOG) injection 0-15 Units  0-15 Units Subcutaneous TID WC Truett Mainland, DO   2 Units at 12/14/13 1203  . isosorbide mononitrate (IMDUR) 24 hr tablet 15 mg  15 mg Oral Daily Tanna Savoy Stinson, DO   15 mg at 12/14/13 1155  . Linaclotide (LINZESS) capsule 145 mcg  145 mcg Oral Daily Truett Mainland, DO   145 mcg at 12/14/13 1155  . metoprolol tartrate (LOPRESSOR) tablet 12.5 mg  12.5 mg Oral BID Tanna Savoy Stinson, DO   12.5 mg at 12/14/13 1154  . nitroGLYCERIN (NITROSTAT) SL tablet 0.4 mg  0.4 mg Sublingual Q5 min PRN Tanna Savoy Stinson, DO      . nystatin (MYCOSTATIN) 100000 UNIT/ML suspension 500,000-1,000,000 Units  5-10 mL Mouth/Throat QID PRN Truett Mainland, DO      . Oxcarbazepine (TRILEPTAL) tablet 300 mg  300 mg Oral BID Tanna Savoy Stinson, DO   300 mg at 12/14/13 1153  . oxybutynin (DITROPAN) tablet 5 mg  5 mg Oral BID Tanna Savoy  Stinson, DO   5 mg at 12/14/13 1154  . polyethylene glycol (MIRALAX / GLYCOLAX) packet 17 g  17 g Oral Daily Tanna Savoy Stinson, DO   17 g at 12/14/13 1154  . pravastatin (PRAVACHOL) tablet 40 mg  40 mg Oral QPM Tanna Savoy Stinson, DO   40 mg at 12/13/13 2255  . predniSONE (DELTASONE) tablet 10 mg  10 mg Oral Daily Tanna Savoy Stinson, DO   10 mg at 12/14/13 1155  . tamsulosin (FLOMAX) capsule 0.4 mg  0.4 mg Oral QHS Tanna Savoy Stinson, DO   0.4 mg at 12/13/13 2254  . tiotropium (SPIRIVA) inhalation capsule 18 mcg  18 mcg Inhalation Daily Tanna Savoy Stinson, DO   18 mcg at 12/13/13 1945  . torsemide (DEMADEX) tablet 20 mg  20 mg Oral Daily Tanna Savoy Stinson, DO   20 mg at 12/14/13 1154    Allergies as of 12/13/2013 - Review Complete 12/13/2013  Allergen Reaction Noted  . Penicillins Itching 07/15/2008    Family History  Problem Relation Age of Onset  . Emphysema Brother   . Heart disease Sister   . Cancer Brother     lung  . Diabetes Brother   . Cancer Brother     throat  . Diabetes Brother   . Rheum arthritis Mother   . Arthritis Mother   . Rheum arthritis Father   . Colon cancer Neg Hx   . Diabetes    . Stroke Mother   . Heart attack    . Heart attack Sister     History   Social History  . Marital Status: Widowed    Spouse Name: N/A    Number of Children: 2  . Years of Education: N/A   Occupational History  .  Retired Multimedia programmer   Social History Main Topics  . Smoking status: Former Smoker -- 0.30 packs/day for 50 years    Types: Cigarettes    Quit date: 01/15/1990  . Smokeless tobacco: Never Used  . Alcohol Use: No  . Drug Use: No  . Sexual Activity: Not on file   Other Topics Concern  . Not on file   Social History Narrative    Review of Systems: See HPI, otherwise normal ROS  Physical Exam: Temp:  [97.8 F (36.6 C)-98.9 F (37.2 C)] 98.9 F (37.2 C) (11/30 1450) Pulse Rate:  [78-87] 80 (11/30 1450) Resp:  [11-17] 16 (11/30 1450) BP: (100-121)/(50-61)  111/56 mmHg (11/30 1450) SpO2:  [92 %-98 %] 97 % (11/30 1450) Weight:  [170 lb (77.111 kg)] 170 lb (77.111 kg) (11/29 1853) Last BM Date: 12/12/13 Patient is well-developed well-nourished elderly Caucasian male in NAD. Conjunctiva was pale. Sclerae nonicteric. Oropharyngeal mucosa is normal. Dentition in satisfactory condition. No neck masses or thyromegaly noted. He has a pacemaker in left pectoral region. Cardiac exam with regular rhythm normal S1 and S2. No murmur or gallop noted. Lungs are clear to auscultation. Abdomen is symmetrical. Bowel sounds are normal. On palpation abdomen is soft and nontender without organomegaly or masses. No air-fluid edema or clubbing noted.  Lab Results:  Recent Labs  12/13/13 1529 12/14/13 0616  WBC 7.0 9.1  HGB 7.0* 9.1*  HCT 21.5* 26.8*  PLT 183 168   BMET  Recent Labs  12/13/13 1529 12/14/13 0616  NA 135* 139  K 4.6 4.2  CL 96 99  CO2 25 24  GLUCOSE 318* 124*  BUN 48* 48*  CREATININE 3.10* 2.94*  CALCIUM 8.3* 8.5    Studies/Results: Dg Chest 2 View  12/13/2013   CLINICAL DATA:  Recent stroke with residual weakness.  EXAM: CHEST  2 VIEW  COMPARISON:  10/18/2013  FINDINGS: Stable position of the left cardiac dual chamber pacemaker. The lungs are clear. Heart size is mildly enlarged. The trachea is midline. Few densities in the left lower chest could represent atelectasis.  IMPRESSION: No acute chest findings.   Electronically Signed   By: Markus Daft M.D.   On: 12/13/2013 16:32    Assessment; Patient is 80 year old Caucasian male who presents with progressive weakness and found to have hemoglobin of 7 g and heme heme-positive stool. Patient has received 2 units of PRBCs and hemoglobin is up to 9.1 g. He also received 2 units of PRBCs when he was hospitalized 7 weeks ago for acute lacunar infarct and anemia. Iron studies then were within normal limits. Suspect he has anemia of chronic disease and GI bleeding may be contributing.  Patient is at risk for peptic ulcer disease as well as reflux esophagitis and GI angiodysplasia.  Patient is high risk for any endoscopic intervention. If he has evidence of overt GI bleed or drop in H&H will offer esophagogastroduodenoscopy.  Recommendations; Repeat iron studies on admission blood prior to transfusion. CBC in a.m. If H&H drops significantly would offer diagnostic EGD.   LOS: 1 day   REHMAN,NAJEEB U  12/14/2013, 4:54 PM

## 2013-12-14 NOTE — Care Management Note (Addendum)
    Page 1 of 1   12/17/2013     10:58:13 AM CARE MANAGEMENT NOTE 12/17/2013  Patient:  [name], [name]   Account Number:  [id number]  Date Initiated:  12/14/2013  Documentation initiated by:  Jolene Provost  Subjective/Objective Assessment:   Pt is from home with 24 hour care either form family or hired aids. Pt is active with [facility] for RN and PT.     Action/Plan:   Per families request Hospice has been consulted and will contact Curt Bears (daughter) to arrange for meeting time. Will continue to follow for CM needs.   Anticipated DC Date:  12/16/2013   Anticipated DC Plan:    CM consult      Choice offered to / List presented to:             Status of service:  Completed, signed off Medicare Important Message given?   (If response is "NO", the following Medicare IM given date fields will be blank) Date Medicare IM given:   Medicare IM given by:   Date Additional Medicare IM given:   Additional Medicare IM given by:    Discharge Disposition:  [place]  Per UR Regulation:    If discussed at Long Length of Stay Meetings, dates discussed:    Comments:  12/17/2013 [place], RN, MSN, PCCN Pt plans to discharge to SNF today. Family is at bedside and aware of discharge arrangments. No CM needs at this time.  12/15/2013 [place], RN, MSN, PCCN Pt not hopsice canditate at this time. Plan is now to go to SNF. CSW aware of discharge plan and will make arrangments. No CM needs at this time. Plan for discharge tomorrow.  12/14/2013 [place], RN, MSN, [facility]

## 2013-12-14 NOTE — Progress Notes (Signed)
Received consult on [name].  Our Palliative Care Team does not have staffing at [facility].  We have touched base with CM/SW there to assist with options regarding hospice care.  If there are questions or concerns, we are available by phone at [phone]

## 2013-12-14 NOTE — Progress Notes (Signed)
Subjective: [name] has received 2 units of packed red cells with an improvement in his hemoglobin to 9.1 now.  Objective: Vital signs in last 24 hours: Filed Vitals:   12/14/13 0026 12/14/13 0143 12/14/13 0330 12/14/13 0652  BP: 117/55 102/55 110/55 115/60  Pulse: 81 87 78 79  Temp: 98.1 F (36.7 C) 98.2 F (36.8 C) 98.3 F (36.8 C) 98.3 F (36.8 C)  TempSrc: Oral Oral Oral Oral  Resp: 16 16 16 16   Height:      Weight:      SpO2: 94% 92% 92% 97%   Weight change:   Intake/Output Summary (Last 24 hours) at 12/14/13 0746 Last data filed at 12/14/13 0400  Gross per 24 hour  Intake      0 ml  Output    450 ml  Net   -450 ml    Physical Exam: See previous  Lab Results:    Results for orders placed or performed during the hospital encounter of 12/13/13 (from the past 24 hour(s))  CBC with Differential     Status: Abnormal   Collection Time: 12/13/13  3:29 PM  Result Value Ref Range   WBC 7.0 4.0 - 10.5 K/uL   RBC 2.16 (L) 4.22 - 5.81 MIL/uL   Hemoglobin 7.0 (L) 13.0 - 17.0 g/dL   HCT 21.5 (L) 39.0 - 52.0 %   MCV 99.5 78.0 - 100.0 fL   MCH 32.4 26.0 - 34.0 pg   MCHC 32.6 30.0 - 36.0 g/dL   RDW 17.7 (H) 11.5 - 15.5 %   Platelets 183 150 - 400 K/uL   Neutrophils Relative % 84 (H) 43 - 77 %   Neutro Abs 5.9 1.7 - 7.7 K/uL   Lymphocytes Relative 11 (L) 12 - 46 %   Lymphs Abs 0.8 0.7 - 4.0 K/uL   Monocytes Relative 4 3 - 12 %   Monocytes Absolute 0.3 0.1 - 1.0 K/uL   Eosinophils Relative 1 0 - 5 %   Eosinophils Absolute 0.1 0.0 - 0.7 K/uL   Basophils Relative 0 0 - 1 %   Basophils Absolute 0.0 0.0 - 0.1 K/uL  Basic metabolic panel     Status: Abnormal   Collection Time: 12/13/13  3:29 PM  Result Value Ref Range   Sodium 135 (L) 137 - 147 mEq/L   Potassium 4.6 3.7 - 5.3 mEq/L   Chloride 96 96 - 112 mEq/L   CO2 25 19 - 32 mEq/L   Glucose, Bld 318 (H) 70 - 99 mg/dL   BUN 48 (H) 6 - 23 mg/dL   Creatinine, Ser 3.10 (H) 0.50 - 1.35 mg/dL   Calcium 8.3 (L) 8.4 -  10.5 mg/dL   GFR calc non Af Amer 16 (L) >90 mL/min   GFR calc Af Amer 19 (L) >90 mL/min   Anion gap 14 5 - 15  Troponin I     Status: None   Collection Time: 12/13/13  3:29 PM  Result Value Ref Range   Troponin I <0.30 <0.30 ng/mL  Lactic acid, plasma     Status: Abnormal   Collection Time: 12/13/13  3:39 PM  Result Value Ref Range   Lactic Acid, Venous 2.4 (H) 0.5 - 2.2 mmol/L  Urinalysis, Routine w reflex microscopic     Status: Abnormal   Collection Time: 12/13/13  5:00 PM  Result Value Ref Range   Color, Urine YELLOW YELLOW   APPearance CLEAR CLEAR   Specific Gravity, Urine 1.010 1.005 -  1.030   pH 6.5 5.0 - 8.0   Glucose, UA 250 (A) NEGATIVE mg/dL   Hgb urine dipstick TRACE (A) NEGATIVE   Bilirubin Urine NEGATIVE NEGATIVE   Ketones, ur NEGATIVE NEGATIVE mg/dL   Protein, ur NEGATIVE NEGATIVE mg/dL   Urobilinogen, UA 0.2 0.0 - 1.0 mg/dL   Nitrite NEGATIVE NEGATIVE   Leukocytes, UA NEGATIVE NEGATIVE  Urine microscopic-add on     Status: None   Collection Time: 12/13/13  5:00 PM  Result Value Ref Range   RBC / HPF 0-2 <3 RBC/hpf  Type and screen     Status: None (Preliminary result)   Collection Time: 12/13/13  5:43 PM  Result Value Ref Range   ABO/RH(D) A POS    Antibody Screen NEG    Sample Expiration 12/16/2013    Unit Number [id number]    Blood Component Type RED CELLS,LR    Unit division 00    Status of Unit ISSUED,FINAL    Transfusion Status OK TO TRANSFUSE    Crossmatch Result Compatible    Unit Number [id number]    Blood Component Type RED CELLS,LR    Unit division 00    Status of Unit ISSUED    Transfusion Status OK TO TRANSFUSE    Crossmatch Result Compatible   Prepare RBC     Status: None   Collection Time: 12/13/13  5:43 PM  Result Value Ref Range   Order Confirmation ORDER PROCESSED BY BLOOD BANK   POC occult blood, ED     Status: Abnormal   Collection Time: 12/13/13  6:15 PM  Result Value Ref Range   Fecal Occult Bld POSITIVE (A) NEGATIVE   Glucose, capillary     Status: Abnormal   Collection Time: 12/13/13  7:01 PM  Result Value Ref Range   Glucose-Capillary 290 (H) 70 - 99 mg/dL   Comment 1 Notify RN   Glucose, capillary     Status: Abnormal   Collection Time: 12/13/13  8:45 PM  Result Value Ref Range   Glucose-Capillary 270 (H) 70 - 99 mg/dL   Comment 1 Notify RN   CBC     Status: Abnormal   Collection Time: 12/14/13  6:16 AM  Result Value Ref Range   WBC 9.1 4.0 - 10.5 K/uL   RBC 2.83 (L) 4.22 - 5.81 MIL/uL   Hemoglobin 9.1 (L) 13.0 - 17.0 g/dL   HCT 26.8 (L) 39.0 - 52.0 %   MCV 94.7 78.0 - 100.0 fL   MCH 32.2 26.0 - 34.0 pg   MCHC 34.0 30.0 - 36.0 g/dL   RDW 18.9 (H) 11.5 - 15.5 %   Platelets 168 150 - 400 K/uL  Basic metabolic panel     Status: Abnormal   Collection Time: 12/14/13  6:16 AM  Result Value Ref Range   Sodium 139 137 - 147 mEq/L   Potassium 4.2 3.7 - 5.3 mEq/L   Chloride 99 96 - 112 mEq/L   CO2 24 19 - 32 mEq/L   Glucose, Bld 124 (H) 70 - 99 mg/dL   BUN 48 (H) 6 - 23 mg/dL   Creatinine, Ser 2.94 (H) 0.50 - 1.35 mg/dL   Calcium 8.5 8.4 - 10.5 mg/dL   GFR calc non Af Amer 17 (L) >90 mL/min   GFR calc Af Amer 20 (L) >90 mL/min   Anion gap 16 (H) 5 - 15     ABGS No results for input(s): PHART, PO2ART, TCO2, HCO3 in the last 72 hours.  Invalid  input(s): PCO2 CULTURES No results found for this or any previous visit (from the past 240 hour(s)). Studies/Results: Dg Chest 2 View  12/13/2013   CLINICAL DATA:  Recent stroke with residual weakness.  EXAM: CHEST  2 VIEW  COMPARISON:  10/18/2013  FINDINGS: Stable position of the left cardiac dual chamber pacemaker. The lungs are clear. Heart size is mildly enlarged. The trachea is midline. Few densities in the left lower chest could represent atelectasis.  IMPRESSION: No acute chest findings.   Electronically Signed   By: Markus Daft M.D.   On: 12/13/2013 16:32   Micro Results: No results found for this or any previous visit (from the past 240  hour(s)). Studies/Results: Dg Chest 2 View  12/13/2013   CLINICAL DATA:  Recent stroke with residual weakness.  EXAM: CHEST  2 VIEW  COMPARISON:  10/18/2013  FINDINGS: Stable position of the left cardiac dual chamber pacemaker. The lungs are clear. Heart size is mildly enlarged. The trachea is midline. Few densities in the left lower chest could represent atelectasis.  IMPRESSION: No acute chest findings.   Electronically Signed   By: Markus Daft M.D.   On: 12/13/2013 16:32   Medications:  I have reviewed the patient's current medications Scheduled Meds: . allopurinol  100 mg Oral q morning - 10a  . aspirin  81 mg Oral Daily  . clopidogrel  75 mg Oral q morning - 10a  . donepezil  10 mg Oral QHS  . famotidine  20 mg Oral BID  . insulin aspart  0-15 Units Subcutaneous TID WC  . isosorbide mononitrate  15 mg Oral Daily  . Linaclotide  145 mcg Oral Daily  . metoprolol tartrate  12.5 mg Oral BID  . Oxcarbazepine  300 mg Oral BID  . oxybutynin  5 mg Oral BID  . polyethylene glycol  17 g Oral Daily  . pravastatin  40 mg Oral QPM  . predniSONE  10 mg Oral Daily  . tamsulosin  0.4 mg Oral QHS  . tiotropium  18 mcg Inhalation Daily  . torsemide  20 mg Oral Daily   Continuous Infusions:  PRN Meds:.acetaminophen **OR** acetaminophen, alum & mag hydroxide-simeth, docusate sodium, nitroGLYCERIN, nystatin   Assessment/Plan: See previous Active Problems:   Symptomatic anemia   Occult GI bleeding     LOS: 1 day   [name] 12/14/2013, 7:46 AM

## 2013-12-14 NOTE — Progress Notes (Signed)
Subjective: [name] was admitted yesterday after feeling generally weak. His hemoglobin was down to 7.0. His hemoglobin week ago had been 8.0. He denies melena or bright red rectal bleeding. He is on 3 month course of Plavix with aspirin following a recent lacunar stroke. He has another month to go with this combination. Objective: Vital signs in last 24 hours: Filed Vitals:   12/14/13 0026 12/14/13 0143 12/14/13 0330 12/14/13 0652  BP: 117/55 102/55 110/55 115/60  Pulse: 81 87 78 79  Temp: 98.1 F (36.7 C) 98.2 F (36.8 C) 98.3 F (36.8 C) 98.3 F (36.8 C)  TempSrc: Oral Oral Oral Oral  Resp: 16 16 16 16   Height:      Weight:      SpO2: 94% 92% 92% 97%   Weight change:   Intake/Output Summary (Last 24 hours) at 12/14/13 0738 Last data filed at 12/14/13 0400  Gross per 24 hour  Intake      0 ml  Output    450 ml  Net   -450 ml    Physical Exam: Alert. No distress. Lungs clear. Heart regular. Abdomen nontender with no organomegaly. Neuro at baseline.  Lab Results:    Results for orders placed or performed during the hospital encounter of 12/13/13 (from the past 24 hour(s))  CBC with Differential     Status: Abnormal   Collection Time: 12/13/13  3:29 PM  Result Value Ref Range   WBC 7.0 4.0 - 10.5 K/uL   RBC 2.16 (L) 4.22 - 5.81 MIL/uL   Hemoglobin 7.0 (L) 13.0 - 17.0 g/dL   HCT 21.5 (L) 39.0 - 52.0 %   MCV 99.5 78.0 - 100.0 fL   MCH 32.4 26.0 - 34.0 pg   MCHC 32.6 30.0 - 36.0 g/dL   RDW 17.7 (H) 11.5 - 15.5 %   Platelets 183 150 - 400 K/uL   Neutrophils Relative % 84 (H) 43 - 77 %   Neutro Abs 5.9 1.7 - 7.7 K/uL   Lymphocytes Relative 11 (L) 12 - 46 %   Lymphs Abs 0.8 0.7 - 4.0 K/uL   Monocytes Relative 4 3 - 12 %   Monocytes Absolute 0.3 0.1 - 1.0 K/uL   Eosinophils Relative 1 0 - 5 %   Eosinophils Absolute 0.1 0.0 - 0.7 K/uL   Basophils Relative 0 0 - 1 %   Basophils Absolute 0.0 0.0 - 0.1 K/uL  Basic metabolic panel     Status: Abnormal   Collection Time:  12/13/13  3:29 PM  Result Value Ref Range   Sodium 135 (L) 137 - 147 mEq/L   Potassium 4.6 3.7 - 5.3 mEq/L   Chloride 96 96 - 112 mEq/L   CO2 25 19 - 32 mEq/L   Glucose, Bld 318 (H) 70 - 99 mg/dL   BUN 48 (H) 6 - 23 mg/dL   Creatinine, Ser 3.10 (H) 0.50 - 1.35 mg/dL   Calcium 8.3 (L) 8.4 - 10.5 mg/dL   GFR calc non Af Amer 16 (L) >90 mL/min   GFR calc Af Amer 19 (L) >90 mL/min   Anion gap 14 5 - 15  Troponin I     Status: None   Collection Time: 12/13/13  3:29 PM  Result Value Ref Range   Troponin I <0.30 <0.30 ng/mL  Lactic acid, plasma     Status: Abnormal   Collection Time: 12/13/13  3:39 PM  Result Value Ref Range   Lactic Acid, Venous 2.4 (H) 0.5 -  2.2 mmol/L  Urinalysis, Routine w reflex microscopic     Status: Abnormal   Collection Time: 12/13/13  5:00 PM  Result Value Ref Range   Color, Urine YELLOW YELLOW   APPearance CLEAR CLEAR   Specific Gravity, Urine 1.010 1.005 - 1.030   pH 6.5 5.0 - 8.0   Glucose, UA 250 (A) NEGATIVE mg/dL   Hgb urine dipstick TRACE (A) NEGATIVE   Bilirubin Urine NEGATIVE NEGATIVE   Ketones, ur NEGATIVE NEGATIVE mg/dL   Protein, ur NEGATIVE NEGATIVE mg/dL   Urobilinogen, UA 0.2 0.0 - 1.0 mg/dL   Nitrite NEGATIVE NEGATIVE   Leukocytes, UA NEGATIVE NEGATIVE  Urine microscopic-add on     Status: None   Collection Time: 12/13/13  5:00 PM  Result Value Ref Range   RBC / HPF 0-2 <3 RBC/hpf  Type and screen     Status: None (Preliminary result)   Collection Time: 12/13/13  5:43 PM  Result Value Ref Range   ABO/RH(D) A POS    Antibody Screen NEG    Sample Expiration 12/16/2013    Unit Number [id number]    Blood Component Type RED CELLS,LR    Unit division 00    Status of Unit ISSUED,FINAL    Transfusion Status OK TO TRANSFUSE    Crossmatch Result Compatible    Unit Number [id number]    Blood Component Type RED CELLS,LR    Unit division 00    Status of Unit ISSUED    Transfusion Status OK TO TRANSFUSE    Crossmatch Result  Compatible   Prepare RBC     Status: None   Collection Time: 12/13/13  5:43 PM  Result Value Ref Range   Order Confirmation ORDER PROCESSED BY BLOOD BANK   POC occult blood, ED     Status: Abnormal   Collection Time: 12/13/13  6:15 PM  Result Value Ref Range   Fecal Occult Bld POSITIVE (A) NEGATIVE  Glucose, capillary     Status: Abnormal   Collection Time: 12/13/13  7:01 PM  Result Value Ref Range   Glucose-Capillary 290 (H) 70 - 99 mg/dL   Comment 1 Notify RN   Glucose, capillary     Status: Abnormal   Collection Time: 12/13/13  8:45 PM  Result Value Ref Range   Glucose-Capillary 270 (H) 70 - 99 mg/dL   Comment 1 Notify RN      ABGS No results for input(s): PHART, PO2ART, TCO2, HCO3 in the last 72 hours.  Invalid input(s): PCO2 CULTURES No results found for this or any previous visit (from the past 240 hour(s)). Studies/Results: Dg Chest 2 View  12/13/2013   CLINICAL DATA:  Recent stroke with residual weakness.  EXAM: CHEST  2 VIEW  COMPARISON:  10/18/2013  FINDINGS: Stable position of the left cardiac dual chamber pacemaker. The lungs are clear. Heart size is mildly enlarged. The trachea is midline. Few densities in the left lower chest could represent atelectasis.  IMPRESSION: No acute chest findings.   Electronically Signed   By: Markus Daft M.D.   On: 12/13/2013 16:32   Micro Results: No results found for this or any previous visit (from the past 240 hour(s)). Studies/Results: Dg Chest 2 View  12/13/2013   CLINICAL DATA:  Recent stroke with residual weakness.  EXAM: CHEST  2 VIEW  COMPARISON:  10/18/2013  FINDINGS: Stable position of the left cardiac dual chamber pacemaker. The lungs are clear. Heart size is mildly enlarged. The trachea is midline. Few densities in the  left lower chest could represent atelectasis.  IMPRESSION: No acute chest findings.   Electronically Signed   By: Markus Daft M.D.   On: 12/13/2013 16:32   Medications:  I have reviewed the patient's current  medications Scheduled Meds: . allopurinol  100 mg Oral q morning - 10a  . aspirin  81 mg Oral Daily  . clopidogrel  75 mg Oral q morning - 10a  . donepezil  10 mg Oral QHS  . famotidine  20 mg Oral BID  . insulin aspart  0-15 Units Subcutaneous TID WC  . isosorbide mononitrate  15 mg Oral Daily  . Linaclotide  145 mcg Oral Daily  . metoprolol tartrate  12.5 mg Oral BID  . Oxcarbazepine  300 mg Oral BID  . oxybutynin  5 mg Oral BID  . polyethylene glycol  17 g Oral Daily  . pravastatin  40 mg Oral QPM  . predniSONE  10 mg Oral Daily  . tamsulosin  0.4 mg Oral QHS  . tiotropium  18 mcg Inhalation Daily  . torsemide  20 mg Oral Daily   Continuous Infusions:  PRN Meds:.acetaminophen **OR** acetaminophen, alum & mag hydroxide-simeth, docusate sodium, nitroGLYCERIN, nystatin   Assessment/Plan: #1. Anemia of chronic renal disease. Reconsult hematology for treatment with Procrit. #2. Hemoccult-positive stool. Will review previous GI evaluation and will reconsult GI for additional guidance. #3. Diabetes. Continue sliding scale NovoLog. #4. Chronic kidney disease. Stable. #5. Congestive heart failure. Stable.  It appears there was discussion regarding hospice/palliative care. We will await the consultation. Active Problems:   Symptomatic anemia   Occult GI bleeding     LOS: 1 day   Kayde Warehime 12/14/2013, 7:38 AM

## 2013-12-14 NOTE — Progress Notes (Signed)
UR completed 

## 2013-12-15 ENCOUNTER — Encounter (HOSPITAL_COMMUNITY): Payer: Self-pay | Admitting: Oncology

## 2013-12-15 DIAGNOSIS — N184 Chronic kidney disease, stage 4 (severe): Secondary | ICD-10-CM

## 2013-12-15 DIAGNOSIS — R195 Other fecal abnormalities: Secondary | ICD-10-CM

## 2013-12-15 DIAGNOSIS — D649 Anemia, unspecified: Secondary | ICD-10-CM

## 2013-12-15 DIAGNOSIS — D638 Anemia in other chronic diseases classified elsewhere: Secondary | ICD-10-CM | POA: Diagnosis not present

## 2013-12-15 DIAGNOSIS — D696 Thrombocytopenia, unspecified: Secondary | ICD-10-CM | POA: Diagnosis not present

## 2013-12-15 HISTORY — PX: TONGUE BIOPSY: SHX1075

## 2013-12-15 LAB — CBC
HCT: 26.4 % — ABNORMAL LOW (ref 39.0–52.0)
HEMOGLOBIN: 9.1 g/dL — AB (ref 13.0–17.0)
MCH: 32.5 pg (ref 26.0–34.0)
MCHC: 34.5 g/dL (ref 30.0–36.0)
MCV: 94.3 fL (ref 78.0–100.0)
Platelets: 156 10*3/uL (ref 150–400)
RBC: 2.8 MIL/uL — AB (ref 4.22–5.81)
RDW: 18.9 % — ABNORMAL HIGH (ref 11.5–15.5)
WBC: 8.9 10*3/uL (ref 4.0–10.5)

## 2013-12-15 LAB — GLUCOSE, CAPILLARY
GLUCOSE-CAPILLARY: 233 mg/dL — AB (ref 70–99)
Glucose-Capillary: 158 mg/dL — ABNORMAL HIGH (ref 70–99)
Glucose-Capillary: 92 mg/dL (ref 70–99)
Glucose-Capillary: 187 mg/dL — ABNORMAL HIGH (ref 70–99)
Glucose-Capillary: 181 mg/dL — ABNORMAL HIGH (ref 70–99)

## 2013-12-15 LAB — TYPE AND SCREEN
ABO/RH(D): A POS
ANTIBODY SCREEN: NEGATIVE
UNIT DIVISION: 0
Unit division: 0

## 2013-12-15 LAB — FERRITIN: Ferritin: 351 ng/mL — ABNORMAL HIGH (ref 22–322)

## 2013-12-15 LAB — IRON AND TIBC
IRON: 22 ug/dL — AB (ref 42–135)
SATURATION RATIOS: 11 % — AB (ref 20–55)
TIBC: 204 ug/dL — ABNORMAL LOW (ref 215–435)
UIBC: 182 ug/dL (ref 125–400)

## 2013-12-15 LAB — URINE CULTURE
COLONY COUNT: NO GROWTH
Culture: NO GROWTH

## 2013-12-15 NOTE — Progress Notes (Signed)
Subjective: [name] denies any signs of bleeding. His hemoglobin was up to 9.1 yesterday and is 9.1 again today. He feels that his weakness is improved. He has remained hemodynamically stable.  Objective: Vital signs in last 24 hours: Filed Vitals:   12/14/13 0652 12/14/13 1450 12/14/13 2210 12/15/13 0528  BP: 115/60 111/56 121/59 110/55  Pulse: 79 80 82 81  Temp: 98.3 F (36.8 C) 98.9 F (37.2 C) 98.5 F (36.9 C) 98 F (36.7 C)  TempSrc: Oral Oral Oral Oral  Resp: 16 16 20 20   Height:      Weight:      SpO2: 97% 97% 90% 91%   Weight change:   Intake/Output Summary (Last 24 hours) at 12/15/13 0743 Last data filed at 12/15/13 0540  Gross per 24 hour  Intake   3785 ml  Output      0 ml  Net   3785 ml    Physical Exam: Alert. No distress. Lungs clear. Heart regular. Abdomen soft and nontender with no organomegaly.  Lab Results:    Results for orders placed or performed during the hospital encounter of 12/13/13 (from the past 24 hour(s))  Glucose, capillary     Status: Abnormal   Collection Time: 12/14/13 11:44 AM  Result Value Ref Range   Glucose-Capillary 144 (H) 70 - 99 mg/dL   Comment 1 Notify RN    Comment 2 Documented in Chart   Glucose, capillary     Status: Abnormal   Collection Time: 12/14/13  5:07 PM  Result Value Ref Range   Glucose-Capillary 194 (H) 70 - 99 mg/dL  Glucose, capillary     Status: Abnormal   Collection Time: 12/14/13 10:09 PM  Result Value Ref Range   Glucose-Capillary 158 (H) 70 - 99 mg/dL   Comment 1 Documented in Chart    Comment 2 Notify RN   CBC     Status: Abnormal   Collection Time: 12/15/13  5:20 AM  Result Value Ref Range   WBC 8.9 4.0 - 10.5 K/uL   RBC 2.80 (L) 4.22 - 5.81 MIL/uL   Hemoglobin 9.1 (L) 13.0 - 17.0 g/dL   HCT 26.4 (L) 39.0 - 52.0 %   MCV 94.3 78.0 - 100.0 fL   MCH 32.5 26.0 - 34.0 pg   MCHC 34.5 30.0 - 36.0 g/dL   RDW 18.9 (H) 11.5 - 15.5 %   Platelets 156 150 - 400 K/uL  Glucose, capillary     Status:  None   Collection Time: 12/15/13  7:14 AM  Result Value Ref Range   Glucose-Capillary 92 70 - 99 mg/dL   Comment 1 Notify RN    Comment 2 Documented in Chart      ABGS No results for input(s): PHART, PO2ART, TCO2, HCO3 in the last 72 hours.  Invalid input(s): PCO2 CULTURES No results found for this or any previous visit (from the past 240 hour(s)). Studies/Results: Dg Chest 2 View  12/13/2013   CLINICAL DATA:  Recent stroke with residual weakness.  EXAM: CHEST  2 VIEW  COMPARISON:  10/18/2013  FINDINGS: Stable position of the left cardiac dual chamber pacemaker. The lungs are clear. Heart size is mildly enlarged. The trachea is midline. Few densities in the left lower chest could represent atelectasis.  IMPRESSION: No acute chest findings.   Electronically Signed   By: Markus Daft M.D.   On: 12/13/2013 16:32   Micro Results: No results found for this or any previous visit (from the past  240 hour(s)). Studies/Results: Dg Chest 2 View  12/13/2013   CLINICAL DATA:  Recent stroke with residual weakness.  EXAM: CHEST  2 VIEW  COMPARISON:  10/18/2013  FINDINGS: Stable position of the left cardiac dual chamber pacemaker. The lungs are clear. Heart size is mildly enlarged. The trachea is midline. Few densities in the left lower chest could represent atelectasis.  IMPRESSION: No acute chest findings.   Electronically Signed   By: Markus Daft M.D.   On: 12/13/2013 16:32   Medications:  I have reviewed the patient's current medications Scheduled Meds: . allopurinol  100 mg Oral q morning - 10a  . aspirin  81 mg Oral Daily  . clopidogrel  75 mg Oral q morning - 10a  . donepezil  10 mg Oral QHS  . famotidine  20 mg Oral BID  . insulin aspart  0-15 Units Subcutaneous TID WC  . isosorbide mononitrate  15 mg Oral Daily  . Linaclotide  145 mcg Oral Daily  . metoprolol tartrate  12.5 mg Oral BID  . Oxcarbazepine  300 mg Oral BID  . oxybutynin  5 mg Oral BID  . polyethylene glycol  17 g Oral Daily   . pravastatin  40 mg Oral QPM  . predniSONE  10 mg Oral Daily  . tamsulosin  0.4 mg Oral QHS  . tiotropium  18 mcg Inhalation Daily  . torsemide  20 mg Oral Daily   Continuous Infusions:  PRN Meds:.acetaminophen **OR** acetaminophen, alum & mag hydroxide-simeth, docusate sodium, nitroGLYCERIN, nystatin   Assessment/Plan: #1. Anemia. Improved after 2 units. Hemoglobin stable. Discussed with gastroenterology. His last colonoscopy was in 2013.  Await hematology consult regarding the use of Procrit for his contribution from his anemia of chronic renal failure. #2. Diabetes. Hemoglobin A1c is 7.4. Fasting glucose is 92. #3. Chronic diastolic heart failure. Stable on torsemide. #4. Coronary artery disease. Stable. #5. Status post pacemaker.  Consult physical therapy. Consider placement. Active Problems:   Symptomatic anemia   Occult GI bleeding     LOS: 2 days   Torri Michalski 12/15/2013, 7:43 AM

## 2013-12-15 NOTE — Clinical Social Work Placement (Signed)
Clinical Social Work Department CLINICAL SOCIAL WORK PLACEMENT NOTE 12/15/2013  Patient:  WLLIAM, GROSSO  Account Number:  [id number] Admit date:  12/13/2013  Clinical Social Worker:  Benay Pike, LCSW  Date/time:  12/15/2013 11:39 AM  Clinical Social Work is seeking post-discharge placement for this patient at the following level of care:   [place]   (*CSW will update this form in Epic as items are completed)   12/15/2013  Patient/family provided with [place] Department of Clinical Social Work's list of facilities offering this level of care within the geographic area requested by the patient (or if unable, by the patient's family).  12/15/2013  Patient/family informed of their freedom to choose among providers that offer the needed level of care, that participate in Medicare, Medicaid or managed care program needed by the patient, have an available bed and are willing to accept the patient.  12/15/2013  Patient/family informed of [facility]' ownership interest in [facility], as well as of the fact that they are under no obligation to receive care at this facility.  PASARR submitted to EDS on  PASARR number received on   FL2 transmitted to all facilities in geographic area requested by pt/family on  12/15/2013 FL2 transmitted to all facilities within larger geographic area on   Patient informed that his/her managed care company has contracts with or will negotiate with  certain facilities, including the following:     Patient/family informed of bed offers received:   Patient chooses bed at  Physician recommends and patient chooses bed at    Patient to be transferred to  on   Patient to be transferred to facility by  Patient and family notified of transfer on  Name of family member notified:    The following physician request were entered in Epic:   Additional Comments: Pt has existing pasarr number.  Benay Pike, [place]

## 2013-12-15 NOTE — Progress Notes (Addendum)
  Subjective: Patient states he had a better day today. He denies nausea vomiting or abdominal pain. He had single bowel movement. He passed dark brown stool.     Objective: Blood pressure 113/57, pulse 83, temperature 98.9 F (37.2 C), temperature source Oral, resp. rate 20, height 5' 11.5" (1.816 m), weight 170 lb (77.111 kg), SpO2 96 %. Patient appears comfortable in bed. Abdomen is full but soft and nontender. No LE edema or clubbing noted.  Labs/studies Results:   Recent Labs  12/13/13 1529 12/14/13 0616 12/15/13 0520  WBC 7.0 9.1 8.9  HGB 7.0* 9.1* 9.1*  HCT 21.5* 26.8* 26.4*  PLT 183 168 156    BMET   Recent Labs  12/13/13 1529 12/14/13 0616  NA 135* 139  K 4.6 4.2  CL 96 99  CO2 25 24  GLUCOSE 318* 124*  BUN 48* 48*  CREATININE 3.10* 2.94*  CALCIUM 8.3* 8.5    Iron studies prior to transfusion Serum iron 22, TIBC 204 and saturation 11% Serum ferritin 351.  Assessment:  #1. Anemia felt to be primarily due to chronic disease. However stool heme positive on admission and history of melena given. Hemoglobin has remained stable following transfusion of 2 units of PRBCs. Iron studies prior to transfusion revealed low serum iron, TIBC and saturation and high normal ferritin. Serum iron has dropped significantly since it was last checked 7 weeks ago. Patient has been evaluated by Dr. Barnet Glasgow and  associates in multiple studies ordered including peripheral smear. Patient remains on low-dose aspirin and Plavix and therefore at risk for bleed. Therefore should examine upper GI tract to rule out peptic ulcer disease or vascular abnormalities which may help in management. #2. Chronic kidney disease. Renal function stable.  Recommendations;  Diagnostic EGD in a.m.

## 2013-12-15 NOTE — Consult Note (Signed)
[facility] Consultation Oncology  Name: EHAN FREAS      MRN: [id number]    Location: A306/A306-01  Date: 12/15/2013 Time:3:39 PM   REFERRING PHYSICIAN:  Asencion Noble, MD  REASON FOR CONSULT: Anemia   DIAGNOSIS:  Normochromic, normocytic anemia progressive since Jan 2014 in the setting of a positive stool card on 12/13/2013  HISTORY OF PRESENT ILLNESS:   [name] is a pleasant 80 year old white man who presented to the [facility] ED on 12/13/2013 for weakness.  He has a significant past medical history for Grade 3B chronic renal disease, progressive anemia since Jan 2014, thrombocytopenia since May 2012 that is stable, DM, HTN, ACD, COPD, AAA, CHF, LBBBm, among other chronic ailments.   During the patient evaluation in the ED and during his hospitalization, his Hgb has been noted to decline requiring 2 unit PRBC on 12/13/2013 for a hemoglobin of 7.0 g/dL.  Other noted abnormalities include a positive stool card for blood on 12/13/2013, an anemia of chronic disease-like picture on iron studies, and significant renal disease with GFR in the single digits for some time.   I personally reviewed and went over laboratory results with the patient.  The results are noted within this dictation.  I personally reviewed and went over radiographic studies with the patient.  The results are noted within this dictation.    Chart reviewed.  On review, the patient's anemia started in Jan 2014 and has been progressive since then.  This is a normochromic, normocytic anemia.  This correlates with his declining renal function that started with a Grade 3B renal disease in 2009 and has been progressing through the years to a Grade 4 renal disease status and nearly a Grade 5.  His thrombocytopenia began suddenly in May 2012, but has remained very stable since in the 100,000 range.  He denies any blood in stool, black tarry stool, gross hematuria, hemoptysis, fevers, chills, night sweats, unintentional weight  loss, severe abdominal pain, chest pain, gingival bleeding.  He does admit to occasional epistaxis which may be from his nasal cannula O2.  He live alone with his daughter across the street.  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  He has two children, one son and one daughter.  Both in good health to his knowledge.  He notes two of his brother passed from malignancy, one lung, and one primary brain tumor.   Hematologically, he denies any other complaints and ROS questioning is negative.   PAST MEDICAL HISTORY:   Past Medical History  Diagnosis Date  . Gout   . Stroke 1993, 1995    chronic balance issues  . Hyperlipidemia   . Emphysema   . Hypertension   . CAD (coronary artery disease)     stent 2006  . Seizure disorder   . GERD (gastroesophageal reflux disease)   . Cancer   . Asthma   . Syncope Jan. 1, 2014  . AAA (abdominal aortic aneurysm)   . Sinus node dysfunction     s/p PPM implant April 2015 (ST Jude)  . Pneumonia   . Myocardial infarction   . CHF (congestive heart failure)   . Seizures   . Renal disorder     ALLERGIES: Allergies  Allergen Reactions  . Penicillins Itching      MEDICATIONS: I have reviewed the patient's current medications.     PAST SURGICAL HISTORY Past Surgical History  Procedure Laterality Date  . Cardiac catheterization  08/23/2004  bare metal stenting of the proximal circumflex  . Colonoscopy      X 2  . Colonoscopy  11/01/2011    Procedure: COLONOSCOPY;  Surgeon: Rogene Houston, MD;  Location: [facility] ENDO SUITE;  Service: Endoscopy;  Laterality: N/A;  100  . Eye surgery    . Nose surgery    . Pacemaker insertion  04-29-13    STJ Assurity dual chamber pacemaker implanted by Dr Rayann Heman for syncope and sinus node dysfunction    FAMILY HISTORY: Family History  Problem Relation Age of Onset  . Emphysema Brother   . Heart disease Sister   . Cancer Brother     lung  . Diabetes Brother   . Cancer Brother      throat  . Diabetes Brother   . Rheum arthritis Mother   . Arthritis Mother   . Rheum arthritis Father   . Colon cancer Neg Hx   . Diabetes    . Stroke Mother   . Heart attack    . Heart attack Sister     SOCIAL HISTORY:  reports that he quit smoking about 23 years ago. His smoking use included Cigarettes. He has a 15 pack-year smoking history. He has never used smokeless tobacco. He reports that he does not drink alcohol or use illicit drugs.  PERFORMANCE STATUS: The patient's performance status is 2 - Symptomatic, <50% confined to bed  PHYSICAL EXAM: Most Recent Vital Signs: Blood pressure 110/55, pulse 81, temperature 98 F (36.7 C), temperature source Oral, resp. rate 20, height 5' 11.5" (1.816 m), weight 170 lb (77.111 kg), SpO2 91 %. General appearance: alert, cooperative, appears stated age and no distress Head: Normocephalic, without obvious abnormality, atraumatic Eyes: negative findings: lids and lashes normal, conjunctivae and sclerae normal and corneas clear Throat: normal findings: lips normal without lesions, buccal mucosa normal, tongue midline and normal and oropharynx pink & moist without lesions or evidence of thrush Neck: no adenopathy and supple, symmetrical, trachea midline Lungs: clear to auscultation bilaterally Heart: regular rate and rhythm Abdomen: soft, non-tender; bowel sounds normal; no masses,  no organomegaly Extremities: extremities normal, atraumatic, no cyanosis or edema Skin: Skin color, texture, turgor normal. No rashes or lesions Lymph nodes: Cervical, supraclavicular, and axillary nodes normal. Neurologic: Grossly normal  LABORATORY DATA:  Results for orders placed or performed during the hospital encounter of 12/13/13 (from the past 48 hour(s))  Urinalysis, Routine w reflex microscopic     Status: Abnormal   Collection Time: 12/13/13  5:00 PM  Result Value Ref Range   Color, Urine YELLOW YELLOW   APPearance CLEAR CLEAR   Specific Gravity,  Urine 1.010 1.005 - 1.030   pH 6.5 5.0 - 8.0   Glucose, UA 250 (A) NEGATIVE mg/dL   Hgb urine dipstick TRACE (A) NEGATIVE   Bilirubin Urine NEGATIVE NEGATIVE   Ketones, ur NEGATIVE NEGATIVE mg/dL   Protein, ur NEGATIVE NEGATIVE mg/dL   Urobilinogen, UA 0.2 0.0 - 1.0 mg/dL   Nitrite NEGATIVE NEGATIVE   Leukocytes, UA NEGATIVE NEGATIVE  Urine culture     Status: None   Collection Time: 12/13/13  5:00 PM  Result Value Ref Range   Specimen Description URINE, CLEAN CATCH    Special Requests NONE    Culture  Setup Time      12/14/2013 14:13 Performed at [place] Performed at [place] Performed at [facility]  Report Status 12/15/2013 FINAL   Urine microscopic-add on     Status: None   Collection Time: 12/13/13  5:00 PM  Result Value Ref Range   RBC / HPF 0-2 <3 RBC/hpf  Type and screen     Status: None   Collection Time: 12/13/13  5:43 PM  Result Value Ref Range   ABO/RH(D) A POS    Antibody Screen NEG    Sample Expiration 12/16/2013    Unit Number [id number]    Blood Component Type RED CELLS,LR    Unit division 00    Status of Unit ISSUED,FINAL    Transfusion Status OK TO TRANSFUSE    Crossmatch Result Compatible    Unit Number [id number]    Blood Component Type RED CELLS,LR    Unit division 00    Status of Unit ISSUED,FINAL    Transfusion Status OK TO TRANSFUSE    Crossmatch Result Compatible   Prepare RBC     Status: None   Collection Time: 12/13/13  5:43 PM  Result Value Ref Range   Order Confirmation ORDER PROCESSED BY BLOOD BANK   POC occult blood, ED     Status: Abnormal   Collection Time: 12/13/13  6:15 PM  Result Value Ref Range   Fecal Occult Bld POSITIVE (A) NEGATIVE  Glucose, capillary     Status: Abnormal   Collection Time: 12/13/13  7:01 PM  Result Value Ref Range   Glucose-Capillary 290 (H) 70 - 99 mg/dL   Comment 1 Notify RN   Glucose, capillary     Status:  Abnormal   Collection Time: 12/13/13  8:45 PM  Result Value Ref Range   Glucose-Capillary 270 (H) 70 - 99 mg/dL   Comment 1 Notify RN   CBC     Status: Abnormal   Collection Time: 12/14/13  6:16 AM  Result Value Ref Range   WBC 9.1 4.0 - 10.5 K/uL   RBC 2.83 (L) 4.22 - 5.81 MIL/uL   Hemoglobin 9.1 (L) 13.0 - 17.0 g/dL    Comment: DELTA CHECK NOTED   HCT 26.8 (L) 39.0 - 52.0 %   MCV 94.7 78.0 - 100.0 fL   MCH 32.2 26.0 - 34.0 pg   MCHC 34.0 30.0 - 36.0 g/dL   RDW 18.9 (H) 11.5 - 15.5 %   Platelets 168 150 - 400 K/uL  Basic metabolic panel     Status: Abnormal   Collection Time: 12/14/13  6:16 AM  Result Value Ref Range   Sodium 139 137 - 147 mEq/L   Potassium 4.2 3.7 - 5.3 mEq/L   Chloride 99 96 - 112 mEq/L   CO2 24 19 - 32 mEq/L   Glucose, Bld 124 (H) 70 - 99 mg/dL   BUN 48 (H) 6 - 23 mg/dL   Creatinine, Ser 2.94 (H) 0.50 - 1.35 mg/dL   Calcium 8.5 8.4 - 10.5 mg/dL   GFR calc non Af Amer 17 (L) >90 mL/min   GFR calc Af Amer 20 (L) >90 mL/min    Comment: (NOTE) The eGFR has been calculated using the CKD EPI equation. This calculation has not been validated in all clinical situations. eGFR's persistently <90 mL/min signify possible Chronic Kidney Disease.    Anion gap 16 (H) 5 - 15  Hemoglobin A1c     Status: Abnormal   Collection Time: 12/14/13  6:16 AM  Result Value Ref Range   Hgb A1c MFr Bld 7.4 (H) <5.7 %    Comment: (NOTE)  According to the ADA Clinical Practice Recommendations for 2011, when HbA1c is used as a screening test:  >=6.5%   Diagnostic of Diabetes Mellitus           (if abnormal result is confirmed) 5.7-6.4%   Increased risk of developing Diabetes Mellitus References:Diagnosis and Classification of Diabetes Mellitus,Diabetes VQQV,9563,87(FIEPP 1):S62-S69 and Standards of Medical Care in         Diabetes - 2011,Diabetes IRJJ,8841,66 (Suppl 1):S11-S61.    Mean Plasma Glucose 166 (H)  <117 mg/dL    Comment: Performed at [facility]  Glucose, capillary     Status: Abnormal   Collection Time: 12/14/13  7:41 AM  Result Value Ref Range   Glucose-Capillary 121 (H) 70 - 99 mg/dL   Comment 1 Notify RN    Comment 2 Documented in Chart   Glucose, capillary     Status: Abnormal   Collection Time: 12/14/13 11:44 AM  Result Value Ref Range   Glucose-Capillary 144 (H) 70 - 99 mg/dL   Comment 1 Notify RN    Comment 2 Documented in Chart   Glucose, capillary     Status: Abnormal   Collection Time: 12/14/13  5:07 PM  Result Value Ref Range   Glucose-Capillary 194 (H) 70 - 99 mg/dL  Glucose, capillary     Status: Abnormal   Collection Time: 12/14/13 10:09 PM  Result Value Ref Range   Glucose-Capillary 158 (H) 70 - 99 mg/dL   Comment 1 Documented in Chart    Comment 2 Notify RN   CBC     Status: Abnormal   Collection Time: 12/15/13  5:20 AM  Result Value Ref Range   WBC 8.9 4.0 - 10.5 K/uL   RBC 2.80 (L) 4.22 - 5.81 MIL/uL   Hemoglobin 9.1 (L) 13.0 - 17.0 g/dL   HCT 26.4 (L) 39.0 - 52.0 %   MCV 94.3 78.0 - 100.0 fL   MCH 32.5 26.0 - 34.0 pg   MCHC 34.5 30.0 - 36.0 g/dL   RDW 18.9 (H) 11.5 - 15.5 %   Platelets 156 150 - 400 K/uL  Glucose, capillary     Status: None   Collection Time: 12/15/13  7:14 AM  Result Value Ref Range   Glucose-Capillary 92 70 - 99 mg/dL   Comment 1 Notify RN    Comment 2 Documented in Chart   Glucose, capillary     Status: Abnormal   Collection Time: 12/15/13 11:57 AM  Result Value Ref Range   Glucose-Capillary 233 (H) 70 - 99 mg/dL   Comment 1 Notify RN    Comment 2 Documented in Chart       RADIOGRAPHY: Dg Chest 2 View  12/13/2013   CLINICAL DATA:  Recent stroke with residual weakness.  EXAM: CHEST  2 VIEW  COMPARISON:  10/18/2013  FINDINGS: Stable position of the left cardiac dual chamber pacemaker. The lungs are clear. Heart size is mildly enlarged. The trachea is midline. Few densities in the left lower chest could represent  atelectasis.  IMPRESSION: No acute chest findings.   Electronically Signed   By: Markus Daft M.D.   On: 12/13/2013 16:32       PATHOLOGY:  None  ASSESSMENT:  1. Normochromic, normocytic anemia with an anemia of chronic disease picture based upon iron studies.  Progressive since Jan 2014.  Positive stool card on 12/13/2013 2. Thrombocytopenia, stable since May 2012 3. Grade 4 Renal Disease, nearly Grade 5 with progressive renal disease dtaing back to at  least 2009 when he was noted to have Grade 3B Chronic renal disease.  Continue progression of disease to Grade 4 noted in Feb 2015.  4. DM 5. HTN 6. ACD 7. COPD 8. AAA 9. CHF 10. LBBB 11. DNR  Patient Active Problem List   Diagnosis Date Noted  . Symptomatic anemia 12/13/2013  . Occult GI bleeding 12/13/2013  . Acute kidney injury 10/18/2013  . Anemia 10/18/2013  . Hemoptysis 10/18/2013  . Atherosclerosis of native arteries of the extremities with intermittent claudication 08/25/2013  . Peripheral vascular disease, unspecified 08/25/2013  . Bradycardia with documented sinus pauses 04/28/2013  . LBBB (left bundle branch block) 04/28/2013  . Sinus arrest 04/28/2013  . CHF (congestive heart failure) 04/17/2013  . Pneumonitis 04/17/2013  . Thrombocytopenia 04/17/2013  . CKD (chronic kidney disease) stage 3, GFR 30-59 ml/min 04/17/2013  . Hx of TIA (transient ischemic attack) and stroke 04/17/2013  . Acute CHF 04/17/2013  . Syncope 04/14/2013  . Aftercare following surgery of the circulatory system, [place] 02/17/2013  . Chronic constipation 01/27/2013  . Acute lacunar infarction 07/16/2012  . Pedal edema 07/16/2012  . Lumbar herniated disc 01/30/2012  . DDD (degenerative disc disease), lumbosacral 01/01/2012  . Neuropathy 01/01/2012  . Leg pain 01/01/2012  . Constipation 09/26/2011  . Abdominal aneurysm without mention of rupture 08/14/2011  . AAA (abdominal aortic aneurysm) 02/13/2011  . Orthostatic hypotension 12/08/2010  .  Difficulty in walking 07/27/2010  . Abnormality of gait 07/27/2010  . CHRONIC RHINITIS 02/09/2009  . CORONARY ATHEROSCLEROSIS NATIVE CORONARY ARTERY 01/10/2009  . CHEST PAIN 01/10/2009  . HYPERLIPIDEMIA 01/06/2009  . GOUT 01/06/2009  . HYPERTENSION 01/06/2009  . STROKE 01/06/2009  . EMPHYSEMA 01/06/2009  . C O P D gold stage A 01/06/2009  . GERD 01/06/2009      PLAN:  1. I personally reviewed and went over laboratory results with the patient.  The results are noted within this dictation. 2. I personally reviewed and went over radiographic studies with the patient.  The results are noted within this dictation.   3. Chart reviewed 4. Labs today: Erythropoeitin level, B12, Folate, SPEP with IFE, B2M, CRP, soluble transferrin receptor 5. Peripheral smear for hematologic review.  Findings will be added in an addendum.  Smear requested from blood prior to blood transfusion. 6. Depending on lab results, patient may be a candidate for outpatient ESA therapy.  Will need to wait on EPO level results.  7. Recommend maintaining Hgb at 9 g/dL with PRBC transfusions due to his CAD 8. Will follow along from the periphery and the patient is to be seen at the [facility] in 2 weeks for outpatient follow-upon 12/15 at 10:30AM at which time the patient's labs can be reviewed and discussion regarding the role of ESA therapy can be reviewed.  All questions were answered. The patient knows to call the clinic with any problems, questions or concerns. We can certainly see the patient much sooner if necessary.  Patient and plan discussed with Dr. Farrel Gobble and he is in agreement with the aforementioned.   KEFALAS,THOMAS  12/15/2013    Addendum: Peripheral smear is reviewed.  No worrisome findings.  No blasts noted.  Occasional spherocyte, echinocyte, and acanthocyte appreciated.   KEFALAS,THOMAS

## 2013-12-15 NOTE — Progress Notes (Signed)
UR completed 

## 2013-12-15 NOTE — Clinical Social Work Psychosocial (Signed)
Clinical Social Work Department BRIEF PSYCHOSOCIAL ASSESSMENT 12/15/2013  Patient:  [name], [name]     Account Number:  [id number]     Admit date:  12/13/2013  Clinical Social Worker:  [name]  Date/Time:  12/15/2013 11:42 AM  Referred by:  CSW  Date Referred:  12/15/2013 Referred for  SNF Placement   Other Referral:   Interview type:  Patient Other interview type:   daughter and daughter-in-law, [name] and [name]    PSYCHOSOCIAL DATA Living Status:  ALONE Admitted from facility:   Level of care:   Primary support name:  [name] Primary support relationship to patient:  CHILD, ADULT Degree of support available:   very supportive    CURRENT CONCERNS Current Concerns  Post-Acute Placement   Other Concerns:    SOCIAL WORK ASSESSMENT / PLAN CSW met with pt, pt's daughter, [name], and daughter-in-law [name] at bedside. Pt well known to CSW from previous admissions. He went to [facility] in October and spent about 4 weeks there. Pt states he was doing great, but since he has returned home several weeks ago he has not been as active and has become weaker. Family is very involved and supportive. Pt has a private duty aid during the day and family come in every evening until bedtime. PT evaluated pt today and recommendation is for either SNF or home health. Pt and family requested that CSW call [facility] to see if private room is available. [facility] is agreeable to review referral and report that pt is still in 30 day benefit window so Medicare will continue to cover SNF. Pt and family very relieved to hear this and pt is now much more agreeable to consider SNF. CSW will send referral to [facility] only at pt's request.   Assessment/plan status:  Psychosocial Support/Ongoing Assessment of Needs Other assessment/ plan:   Information/referral to community resources:   SNF list    PATIENT'S/FAMILY'S RESPONSE TO PLAN OF CARE: Pt more reluctant  to consider SNF again, but did say things went well last time so will try it again. CSW will follow up.       [name], [place]

## 2013-12-15 NOTE — Evaluation (Signed)
Physical Therapy Evaluation Patient Details Name: [name] MRN: [id number] DOB: June 26, 1921 Today's Date: 12/15/2013   History of Present Illness  Pt was admitted from home with a diagnosis of anemia.  He had been at [facility] for 4 weeks following last hospitalization and returned home with CG and family assist.  He lives alone with family next door.  During his time at home, pt was able to ambulate short distances in the home with a walker.  He reports that he became progressively weaker at home and needed to be admitted to the hospital.  He has been undergoing tests to determine the etiology of his anemia.  Clinical Impression   Pt was seen for evaluation.  He was found to be alert, oriented and very cooperative.  He reports feeling weak and tired.  He is O2 dependent on 2 L O2.  On evaluation he is found to be moderately deconditioned, able to ambulate about 74' with a walker and close guarding.  Strength on manual muscle testing is 4/5 but functional strength is 3/5 due to poor endurance.  While he could go home at discharge with full time assist and HHPT, I would recommend that he initially go to SNF for a short time before going home.  This was discussed with pt and family.    Follow Up Recommendations SNF;Home health PT (based on pt and family preference)    Equipment Recommendations  None recommended by PT    Recommendations for Other Services   none    Precautions / Restrictions Precautions Precautions: Fall Restrictions Weight Bearing Restrictions: No      Mobility  Bed Mobility Overal bed mobility: Modified Independent                Transfers Overall transfer level: Needs assistance Equipment used: Rolling walker (2 wheeled) Transfers: Sit to/from Stand Sit to Stand: Supervision            Ambulation/Gait Ambulation/Gait assistance: Min guard Ambulation Distance (Feet): 75 Feet Assistive device: Rolling walker (2 wheeled) Gait Pattern/deviations: Trunk  flexed;Shuffle   Gait velocity interpretation: Below normal speed for age/gender    Stairs            Wheelchair Mobility    Modified Rankin (Stroke Patients Only)       Balance Overall balance assessment: No apparent balance deficits (not formally assessed) (any balance deficits are due to deconditioning)                                           Pertinent Vitals/Pain Pain Assessment: No/denies pain    Home Living Family/patient expects to be discharged to:: Skilled nursing facility                 Additional Comments: pt could go home with full time assist if this is what he and family prefer, but I am recommending that he go to SNF for a short term of rehab    Prior Function Level of Independence: Needs assistance   Gait / Transfers Assistance Needed: able to transfer and ambulate with a walker independently in the home with poor endurance  ADL's / Homemaking Assistance Needed: assist needed with bathing and dressing, full assist with homemaking        Hand Dominance   Dominant Hand: Right    Extremity/Trunk Assessment  Lower Extremity Assessment: Generalized weakness (strength on MMT is 4/5 but weakness is due to poor endurance)      Cervical / Trunk Assessment: Normal  Communication   Communication: No difficulties  Cognition Arousal/Alertness: Awake/alert Behavior During Therapy: WFL for tasks assessed/performed Overall Cognitive Status: Within Functional Limits for tasks assessed                      General Comments      Exercises        Assessment/Plan    PT Assessment All further PT needs can be met in the next venue of care  PT Diagnosis Difficulty walking;Generalized weakness   PT Problem List Decreased strength;Decreased activity tolerance;Decreased mobility  PT Treatment Interventions     PT Goals (Current goals can be found in the Care Plan section) Acute Rehab PT Goals PT  Goal Formulation: All assessment and education complete, DC therapy    Frequency     Barriers to discharge  none but pt would need full time assist as he lives alone      Co-evaluation               End of Session Equipment Utilized During Treatment: Gait belt;Oxygen Activity Tolerance: Patient tolerated treatment well Patient left: in chair;with call bell/phone within reach;with family/visitor present Nurse Communication: Mobility status    Functional Assessment Tool Used: clinical judgement Functional Limitation: Mobility: Walking and moving around Mobility: Walking and Moving Around Current Status ([id number]): At least 20 percent but less than 40 percent impaired, limited or restricted Mobility: Walking and Moving Around Goal Status [phone]): At least 20 percent but less than 40 percent impaired, limited or restricted Mobility: Walking and Moving Around Discharge Status [phone]): At least 20 percent but less than 40 percent impaired, limited or restricted    Time: 0937-1011 PT Time Calculation (min) (ACUTE ONLY): 34 min   Charges:   PT Evaluation $Initial PT Evaluation Tier I: 1 Procedure     PT G Codes:   Functional Assessment Tool Used: clinical judgement Functional Limitation: Mobility: Walking and moving around    [place] 12/15/2013, 10:33 AM

## 2013-12-15 NOTE — Plan of Care (Signed)
Problem: Phase I Progression Outcomes Goal: Pain controlled with appropriate interventions Outcome: Progressing Goal: OOB as tolerated unless otherwise ordered Outcome: Progressing Goal: Voiding-avoid urinary catheter unless indicated Outcome: Completed/Met Date Met:  12/15/13

## 2013-12-15 NOTE — Plan of Care (Signed)
Problem: Phase I Progression Outcomes Goal: Pain controlled with appropriate interventions Outcome: Completed/Met Date Met:  12/15/13 Goal: OOB as tolerated unless otherwise ordered Outcome: Completed/Met Date Met:  12/15/13 Goal: Initial discharge plan identified Outcome: Completed/Met Date Met:  12/15/13 Goal: Hemodynamically stable Outcome: Completed/Met Date Met:  12/15/13 Goal: Other Phase I Outcomes/Goals Outcome: Not Applicable Date Met:  54/65/68  Problem: Phase II Progression Outcomes Goal: Progress activity as tolerated unless otherwise ordered Outcome: Completed/Met Date Met:  12/15/13 Goal: Discharge plan established Outcome: Completed/Met Date Met:  12/15/13 Goal: Vital signs remain stable Outcome: Completed/Met Date Met:  12/15/13 Goal: IV changed to normal saline lock Outcome: Completed/Met Date Met:  12/15/13 Goal: Obtain order to discontinue catheter if appropriate Outcome: Not Applicable Date Met:  12/75/17 Goal: Other Phase II Outcomes/Goals Outcome: Not Applicable Date Met:  00/17/49  Problem: Phase III Progression Outcomes Goal: Pain controlled on oral analgesia Outcome: Completed/Met Date Met:  12/15/13 Goal: Activity at appropriate level-compared to baseline (UP IN CHAIR FOR HEMODIALYSIS)  Outcome: Adequate for Discharge Goal: Voiding independently Outcome: Completed/Met Date Met:  12/15/13 Goal: IV/normal saline lock discontinued Outcome: Completed/Met Date Met:  12/15/13

## 2013-12-16 ENCOUNTER — Observation Stay (HOSPITAL_COMMUNITY): Payer: Medicare Other

## 2013-12-16 ENCOUNTER — Encounter (HOSPITAL_COMMUNITY)
Admission: EM | Disposition: A | Discharge status: Skilled Nursing Facility | Payer: Self-pay | Source: Home / Self Care | Attending: Emergency Medicine

## 2013-12-16 DIAGNOSIS — R0689 Other abnormalities of breathing: Secondary | ICD-10-CM

## 2013-12-16 DIAGNOSIS — D638 Anemia in other chronic diseases classified elsewhere: Secondary | ICD-10-CM | POA: Diagnosis not present

## 2013-12-16 DIAGNOSIS — K449 Diaphragmatic hernia without obstruction or gangrene: Secondary | ICD-10-CM

## 2013-12-16 DIAGNOSIS — K31819 Angiodysplasia of stomach and duodenum without bleeding: Secondary | ICD-10-CM

## 2013-12-16 HISTORY — PX: ESOPHAGOGASTRODUODENOSCOPY: SHX5428

## 2013-12-16 LAB — GLUCOSE, CAPILLARY
GLUCOSE-CAPILLARY: 107 mg/dL — AB (ref 70–99)
GLUCOSE-CAPILLARY: 100 mg/dL — AB (ref 70–99)
GLUCOSE-CAPILLARY: 164 mg/dL — AB (ref 70–99)
Glucose-Capillary: 96 mg/dL (ref 70–99)
Glucose-Capillary: 147 mg/dL — ABNORMAL HIGH (ref 70–99)

## 2013-12-16 LAB — BASIC METABOLIC PANEL
Anion gap: 13 (ref 5–15)
BUN: 54 mg/dL — ABNORMAL HIGH (ref 6–23)
CHLORIDE: 100 meq/L (ref 96–112)
CO2: 25 mEq/L (ref 19–32)
Calcium: 8.3 mg/dL — ABNORMAL LOW (ref 8.4–10.5)
Creatinine, Ser: 2.97 mg/dL — ABNORMAL HIGH (ref 0.50–1.35)
GFR calc non Af Amer: 17 mL/min — ABNORMAL LOW (ref >90)
GFR, EST AFRICAN AMERICAN: 20 mL/min — AB (ref >90)
GLUCOSE: 103 mg/dL — AB (ref 70–99)
POTASSIUM: 4.2 meq/L (ref 3.7–5.3)
Sodium: 138 mEq/L (ref 137–147)

## 2013-12-16 LAB — CBC
HEMATOCRIT: 25.6 % — AB (ref 39.0–52.0)
HEMOGLOBIN: 9 g/dL — AB (ref 13.0–17.0)
MCH: 33.2 pg (ref 26.0–34.0)
MCHC: 35.2 g/dL (ref 30.0–36.0)
MCV: 94.5 fL (ref 78.0–100.0)
Platelets: 171 10*3/uL (ref 150–400)
RBC: 2.71 MIL/uL — AB (ref 4.22–5.81)
RDW: 18.5 % — AB (ref 11.5–15.5)
WBC: 7.6 10*3/uL (ref 4.0–10.5)

## 2013-12-16 LAB — VITAMIN B12: VITAMIN B 12: 669 pg/mL (ref 211–911)

## 2013-12-16 LAB — FOLATE: Folate: 8.6 ng/mL

## 2013-12-16 LAB — C-REACTIVE PROTEIN: CRP: 2.8 mg/dL — ABNORMAL HIGH (ref <0.60)

## 2013-12-16 SURGERY — EGD (ESOPHAGOGASTRODUODENOSCOPY)
Anesthesia: Moderate Sedation

## 2013-12-16 MED ORDER — BUTAMBEN-TETRACAINE-BENZOCAINE 2-2-14 % EX AERO
INHALATION_SPRAY | CUTANEOUS | Status: DC | PRN
Start: 2013-12-16 — End: 2013-12-16
  Administered 2013-12-16: 2 via TOPICAL

## 2013-12-16 MED ORDER — STERILE WATER FOR IRRIGATION IR SOLN
Status: DC | PRN
  Administered 2013-12-16: 14:00:00

## 2013-12-16 MED ORDER — MEPERIDINE HCL 50 MG/ML IJ SOLN
INTRAMUSCULAR | Status: DC | PRN
  Administered 2013-12-16: 15 mg via INTRAVENOUS

## 2013-12-16 MED ORDER — SODIUM CHLORIDE 0.9 % IV SOLN
INTRAVENOUS | Status: DC
  Administered 2013-12-16 (×2): via INTRAVENOUS

## 2013-12-16 MED ORDER — MIDAZOLAM HCL 5 MG/5ML IJ SOLN
INTRAMUSCULAR | Status: DC | PRN
Start: 2013-12-16 — End: 2013-12-16
  Administered 2013-12-16 (×2): 1 mg via INTRAVENOUS

## 2013-12-16 MED ORDER — MEPERIDINE HCL 50 MG/ML IJ SOLN
INTRAMUSCULAR | Status: AC
  Filled 2013-12-16: qty 1

## 2013-12-16 MED ORDER — MIDAZOLAM HCL 5 MG/5ML IJ SOLN
INTRAMUSCULAR | Status: AC
Start: 2013-12-16 — End: 2013-12-17
  Filled 2013-12-16: qty 10

## 2013-12-16 NOTE — Progress Notes (Signed)
Subjective: [name] is alert and comfortable appearing this morning. He denies any signs of bleeding. He remains hemodynamically stable. He states he was not lightheaded when standing yesterday.  Objective: Vital signs in last 24 hours: Filed Vitals:   12/14/13 2210 12/15/13 0528 12/15/13 2119 12/16/13 0542  BP: 121/59 110/55 113/57 117/68  Pulse: 82 81 83 81  Temp: 98.5 F (36.9 C) 98 F (36.7 C) 98.9 F (37.2 C) 97.9 F (36.6 C)  TempSrc: Oral Oral Oral Oral  Resp: 20 20 20 20   Height:      Weight:      SpO2: 90% 91% 96% 95%   Weight change:   Intake/Output Summary (Last 24 hours) at 12/16/13 0750 Last data filed at 12/16/13 0700  Gross per 24 hour  Intake    600 ml  Output    600 ml  Net      0 ml    Physical Exam: Lungs clear. Heart regular with no murmurs. Abdomen is soft and nontender. Neuro stable.  Lab Results:    Results for orders placed or performed during the hospital encounter of 12/13/13 (from the past 24 hour(s))  Glucose, capillary     Status: Abnormal   Collection Time: 12/15/13 11:57 AM  Result Value Ref Range   Glucose-Capillary 233 (H) 70 - 99 mg/dL   Comment 1 Notify RN    Comment 2 Documented in Chart   Vitamin B12     Status: None   Collection Time: 12/15/13  4:18 PM  Result Value Ref Range   Vitamin B-12 669 211 - 911 pg/mL  Folate     Status: None   Collection Time: 12/15/13  4:18 PM  Result Value Ref Range   Folate 8.6 ng/mL  C-reactive protein     Status: Abnormal   Collection Time: 12/15/13  4:18 PM  Result Value Ref Range   CRP 2.8 (H) <0.60 mg/dL  Glucose, capillary     Status: Abnormal   Collection Time: 12/15/13  4:24 PM  Result Value Ref Range   Glucose-Capillary 187 (H) 70 - 99 mg/dL   Comment 1 Notify RN   Glucose, capillary     Status: Abnormal   Collection Time: 12/15/13  9:22 PM  Result Value Ref Range   Glucose-Capillary 181 (H) 70 - 99 mg/dL  CBC     Status: Abnormal   Collection Time: 12/16/13  5:36 AM   Result Value Ref Range   WBC 7.6 4.0 - 10.5 K/uL   RBC 2.71 (L) 4.22 - 5.81 MIL/uL   Hemoglobin 9.0 (L) 13.0 - 17.0 g/dL   HCT 25.6 (L) 39.0 - 52.0 %   MCV 94.5 78.0 - 100.0 fL   MCH 33.2 26.0 - 34.0 pg   MCHC 35.2 30.0 - 36.0 g/dL   RDW 18.5 (H) 11.5 - 15.5 %   Platelets 171 150 - 400 K/uL  Basic metabolic panel     Status: Abnormal   Collection Time: 12/16/13  5:36 AM  Result Value Ref Range   Sodium 138 137 - 147 mEq/L   Potassium 4.2 3.7 - 5.3 mEq/L   Chloride 100 96 - 112 mEq/L   CO2 25 19 - 32 mEq/L   Glucose, Bld 103 (H) 70 - 99 mg/dL   BUN 54 (H) 6 - 23 mg/dL   Creatinine, Ser 2.97 (H) 0.50 - 1.35 mg/dL   Calcium 8.3 (L) 8.4 - 10.5 mg/dL   GFR calc non Af Amer 17 (L) >90  mL/min   GFR calc Af Amer 20 (L) >90 mL/min   Anion gap 13 5 - 15     ABGS No results for input(s): PHART, PO2ART, TCO2, HCO3 in the last 72 hours.  Invalid input(s): PCO2 CULTURES Recent Results (from the past 240 hour(s))  Urine culture     Status: None   Collection Time: 12/13/13  5:00 PM  Result Value Ref Range Status   Specimen Description URINE, CLEAN CATCH  Final   Special Requests NONE  Final   Culture  Setup Time   Final    12/14/2013 14:13 Performed at [place] Performed at [facility]   Final   Culture NO GROWTH Performed at [facility]   Final   Report Status 12/15/2013 FINAL  Final   Studies/Results: No results found. Micro Results: Recent Results (from the past 240 hour(s))  Urine culture     Status: None   Collection Time: 12/13/13  5:00 PM  Result Value Ref Range Status   Specimen Description URINE, CLEAN CATCH  Final   Special Requests NONE  Final   Culture  Setup Time   Final    12/14/2013 14:13 Performed at [place] Performed at [facility]   Final   Culture NO GROWTH Performed at [facility]   Final   Report Status 12/15/2013 FINAL  Final    Studies/Results: No results found. Medications:  I have reviewed the patient's current medications Scheduled Meds: . allopurinol  100 mg Oral q morning - 10a  . aspirin  81 mg Oral Daily  . clopidogrel  75 mg Oral q morning - 10a  . donepezil  10 mg Oral QHS  . famotidine  20 mg Oral BID  . insulin aspart  0-15 Units Subcutaneous TID WC  . isosorbide mononitrate  15 mg Oral Daily  . Linaclotide  145 mcg Oral Daily  . metoprolol tartrate  12.5 mg Oral BID  . Oxcarbazepine  300 mg Oral BID  . oxybutynin  5 mg Oral BID  . polyethylene glycol  17 g Oral Daily  . pravastatin  40 mg Oral QPM  . tamsulosin  0.4 mg Oral QHS  . tiotropium  18 mcg Inhalation Daily  . torsemide  20 mg Oral Daily   Continuous Infusions: . sodium chloride 20 mL/hr at 12/16/13 0450   PRN Meds:.acetaminophen **OR** acetaminophen, alum & mag hydroxide-simeth, docusate sodium, nitroGLYCERIN, nystatin   Assessment/Plan: #1. Anemia. Hemoglobin is stable at 9. Upper endoscopy is planned for today. Discussed with gastroenterology. He has been seen by hematology. Studies are pending. #2. Diabetes. Fasting glucose is 103. #3. Chronic kidney disease. BUN and creatinine are stable at 54 and 2.97. Active Problems:   Symptomatic anemia   Occult GI bleeding     LOS: 3 days   Maripaz Mullan 12/16/2013, 7:50 AM

## 2013-12-16 NOTE — Op Note (Signed)
EGD PROCEDURE REPORT  PATIENT:  [name]  MR#:  [id number] Birthdate:  05/02/1921, 80 y.o., male Endoscopist:  Dr. Rogene Houston, MD Referred By:  Dr. Asencion Noble, MD Procedure Date: 12/16/2013  Procedure:   EGD  Indications:  Patient is 80 year old Caucasian male with multiple medical problems who also has anemia requiring transfusion and heme positive stool. Patient is on Plavix and low-dose aspirin because of history of remote and recent CVA. He is undergoing EGD primarily for diagnostic reasons.            Informed Consent:  The risks, benefits, alternatives & imponderables which include, but are not limited to, bleeding, infection, perforation, drug reaction and potential missed lesion have been reviewed.  The potential for biopsy, lesion removal, esophageal dilation, etc. have also been discussed.  Questions have been answered.  All parties agreeable.  Please see history & physical in medical record for more information.  Medications:  Demerol 15 mg IV Versed 2 mg IV Cetacaine spray topically for oropharyngeal anesthesia  Description of procedure:  The endoscope was introduced through the mouth and advanced to the second portion of the duodenum without difficulty or limitations. The mucosal surfaces were surveyed very carefully during advancement of the scope and upon withdrawal.  Findings:  Hypopharynx: Small amount of fresh blood and clots noted stradling inter-retinoid fold. Fresh blood noted and larynx and trachea. Esophagus:  Mucosa of the esophagus was normal. GE junction was normal. GEJ:  44 cm Hiatus:  46 cm Stomach:  Stomach was empty other than few small clots which are felt to be swallowed blood. Stomach distended very well with insufflation. Folds in the proximal stomach were unremarkable. Mucosa at gastric body, antrum, pyloric channel, angularis fundus and cardia was normal. Duodenum:  Single small AV malformation was noted along the posterior wall of duodenal bulb  without stigmata of bleeding. Post bulbar mucosa was normal.  Therapeutic/Diagnostic Maneuvers Performed:  None  Complications:  None  Impression: Small amount of fresh blood and clot noted in hypopharynx and upper airway. Small sliding hiatal hernia. No evidence of peptic ulcer disease.. 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  Comment; Based on endoscopic findings patient has hemoptysis and heme positive stool is due to swallowed blood.  Recommendations:  As discussed with Dr. Asencion Noble will proceed with chest CT without IV contrast. Hold Plavix for now. CBC in a.m.  [name]  12/16/2013  2:14 PM  CC: Dr. Asencion Noble, MD & Dr. Rayne Du ref. provider found

## 2013-12-16 NOTE — Clinical Social Work Note (Signed)
CSW presented bed offer at [facility] which pt and pt's daughter accept. Facility notified. Awaiting stability for d/c.   [name], [place]

## 2013-12-16 NOTE — Clinical Social Work Placement (Signed)
Clinical Social Work Department CLINICAL SOCIAL WORK PLACEMENT NOTE 12/16/2013  Patient:  [name], [name]  Account Number:  [id number] Admit date:  12/13/2013  Clinical Social Worker:  Benay Pike, LCSW  Date/time:  12/15/2013 11:39 AM  Clinical Social Work is seeking post-discharge placement for this patient at the following level of care:   [place]   (*CSW will update this form in Epic as items are completed)   12/15/2013  Patient/family provided with [place] Department of Clinical Social Work's list of facilities offering this level of care within the geographic area requested by the patient (or if unable, by the patient's family).  12/15/2013  Patient/family informed of their freedom to choose among providers that offer the needed level of care, that participate in Medicare, Medicaid or managed care program needed by the patient, have an available bed and are willing to accept the patient.  12/15/2013  Patient/family informed of [facility]' ownership interest in [facility], as well as of the fact that they are under no obligation to receive care at this facility.  PASARR submitted to EDS on  PASARR number received on   FL2 transmitted to all facilities in geographic area requested by pt/family on  12/15/2013 FL2 transmitted to all facilities within larger geographic area on   Patient informed that his/her managed care company has contracts with or will negotiate with  certain facilities, including the following:     Patient/family informed of bed offers received:  12/16/2013 Patient chooses bed at [place], [facility] Physician recommends and patient chooses bed at  [facility], [facility]  Patient to be transferred to  on   Patient to be transferred to facility by  Patient and family notified of transfer on  Name of family member notified:    The following physician request were entered in Epic:   Additional Comments: Pt has existing  pasarr number.  Benay Pike, [place]

## 2013-12-17 ENCOUNTER — Encounter: Payer: Self-pay | Admitting: Critical Care Medicine

## 2013-12-17 ENCOUNTER — Encounter (HOSPITAL_COMMUNITY): Payer: Self-pay | Admitting: Internal Medicine

## 2013-12-17 DIAGNOSIS — D638 Anemia in other chronic diseases classified elsewhere: Secondary | ICD-10-CM | POA: Diagnosis not present

## 2013-12-17 LAB — CBC
HCT: 26.3 % — ABNORMAL LOW (ref 39.0–52.0)
Hemoglobin: 8.8 g/dL — ABNORMAL LOW (ref 13.0–17.0)
MCH: 31.7 pg (ref 26.0–34.0)
MCHC: 33.5 g/dL (ref 30.0–36.0)
MCV: 94.6 fL (ref 78.0–100.0)
PLATELETS: 169 10*3/uL (ref 150–400)
RBC: 2.78 MIL/uL — ABNORMAL LOW (ref 4.22–5.81)
RDW: 18.4 % — AB (ref 11.5–15.5)
WBC: 7.9 10*3/uL (ref 4.0–10.5)

## 2013-12-17 LAB — PROTEIN ELECTROPHORESIS, SERUM
ALBUMIN ELP: 56.8 % (ref 55.8–66.1)
ALPHA-1-GLOBULIN: 8 % — AB (ref 2.9–4.9)
ALPHA-2-GLOBULIN: 14.7 % — AB (ref 7.1–11.8)
Beta 2: 5.7 % (ref 3.2–6.5)
Beta Globulin: 4.8 % (ref 4.7–7.2)
GAMMA GLOBULIN: 10 % — AB (ref 11.1–18.8)
M-Spike, %: NOT DETECTED g/dL
Total Protein ELP: 5.1 g/dL — ABNORMAL LOW (ref 6.0–8.3)

## 2013-12-17 LAB — GLUCOSE, CAPILLARY: Glucose-Capillary: 106 mg/dL — ABNORMAL HIGH (ref 70–99)

## 2013-12-17 LAB — BETA 2 MICROGLOBULIN, SERUM: Beta-2 Microglobulin: 14.1 mg/L — ABNORMAL HIGH (ref <=2.51)

## 2013-12-17 LAB — ERYTHROPOIETIN: ERYTHROPOIETIN: 23.6 m[IU]/mL — AB (ref 2.6–18.5)

## 2013-12-17 MED ORDER — TRAMADOL HCL 50 MG PO TABS
50.0000 mg | ORAL_TABLET | Freq: Two times a day (BID) | ORAL | Status: DC | PRN
  Administered 2013-12-17 (×2): 50 mg via ORAL
  Filled 2013-12-17 (×2): qty 1

## 2013-12-17 MED ORDER — FERROUS GLUCONATE 324 (38 FE) MG PO TABS: 324.0000 mg | ORAL_TABLET | Freq: Every day | ORAL | Status: AC

## 2013-12-17 NOTE — Clinical Social Work Note (Signed)
Pt d/c today to [facility]. Pt, pt's daughter-in-law Curt Bears, and facility aware and agreeable. D/C summary faxed. Family to provide transport.  [name], [place]

## 2013-12-17 NOTE — Progress Notes (Addendum)
Patient discharged to [facility].  Report called to [place].  No questions or concerns at this time.  Patient will be transported to facility by family.  IV removed with catheter intact, no bleeding or complications.  Patient left unit in wheelchair with family and patient belongings by staff member.

## 2013-12-17 NOTE — Discharge Summary (Signed)
Physician Discharge Summary  [name] QMG:[id number] DOB: Aug 29, 1921 DOA: 12/13/2013   Admit date: 12/13/2013 Discharge date: 12/17/2013  Discharge Diagnoses: #1. Anemia. #2. Upper airway bleeding of uncertain etiology. #3. Chronic kidney disease stage IV. #4. Type 2 diabetes #5. Coronary artery disease #6. Status post pacemaker placement #7. Seizure disorder #8. Gout #9. Hyperlipidemia #10. Hypertension #11. BPH #12. IBS/chronic constipation Active Problems:   Symptomatic anemia   Occult GI bleeding    Wt Readings from Last 3 Encounters:  12/13/13 170 lb (77.111 kg)  10/18/13 177 lb (80.287 kg)  09/23/13 179 lb (81.194 kg)     Hospital Course:  This patient is a 80 year old male who presented with weakness and anemia. He required transfusions of 2 units of packed red blood cells. His hemoglobin improved to 9.1. His strength improved. He was seen in gastroenterology consultation and underwent an upper endoscopy which revealed no upper GI source of blood loss but did reveal fresh blood in the upper airway. Source could not be identified. He underwent a CT scan of the chest which revealed no evidence of tumor to explain the blood. We will plan to have him evaluated by pulmonology and/or ENT. Plavix is being stopped. He will continue aspirin alone for stroke prevention.  [facility]  Chronic kidney disease has been stable. Creatinine is 2.9. Diabetes control is stable all nondrug treatment. Diastolic heart failure has been stable on torsemide.   Discharge Instructions     Medication List    STOP taking these medications        clopidogrel 75 MG tablet  Commonly known as:  PLAVIX     levofloxacin 500 MG tablet  Commonly known as:  LEVAQUIN     nystatin 100000 UNIT/ML suspension  Commonly known as:  MYCOSTATIN     predniSONE 10 MG tablet  Commonly known as:  DELTASONE     predniSONE 20 MG tablet   Commonly known as:  DELTASONE      TAKE these medications        allopurinol 100 MG tablet  Commonly known as:  ZYLOPRIM  Take 1 tablet (100 mg total) by mouth every morning.     aspirin 81 MG chewable tablet  Chew 1 tablet (81 mg total) by mouth daily.     [facility] MELATONIN 3 MG Tabs  Generic drug:  Melatonin  Take 3 mg by mouth at bedtime.     donepezil 10 MG tablet  Commonly known as:  ARICEPT  Take 10 mg by mouth at bedtime.     famotidine 20 MG tablet  Commonly known as:  PEPCID  TAKE ONE TABLET BY MOUTH TWICE DAILY.     ferrous gluconate 324 MG tablet  Commonly known as:  FERGON  Take 1 tablet (324 mg total) by mouth daily with breakfast.     isosorbide mononitrate 30 MG 24 hr tablet  Commonly known as:  IMDUR  Take 0.5 tablets (15 mg total) by mouth daily.     Linaclotide 145 MCG Caps capsule  Commonly known as:  LINZESS  Take 1 capsule (145 mcg total) by mouth daily.     metoprolol tartrate 25 MG tablet  Commonly known as:  LOPRESSOR  Take 0.5 tablets (12.5 mg total) by mouth 2 (two) times daily.     nitroGLYCERIN 0.4 MG SL tablet  Commonly known as:  NITROSTAT  Place 1 tablet (0.4 mg total) under the tongue every 5 (five) minutes  as needed for chest pain. 1 tablet under tongue at onset of chest pain;you may repeat every 5 minutes for up to 3 doses.     Oxcarbazepine 300 MG tablet  Commonly known as:  TRILEPTAL  Take 300 mg by mouth 2 (two) times daily.     oxybutynin 5 MG tablet  Commonly known as:  DITROPAN  Take 5 mg by mouth 2 (two) times daily.     polyethylene glycol powder powder  Commonly known as:  GLYCOLAX/MIRALAX  Take 17 g by mouth daily. *Mixed in 4-8 ounces of liquid and drink daily     pravastatin 40 MG tablet  Commonly known as:  PRAVACHOL  Take 40 mg by mouth every evening.     tamsulosin 0.4 MG Caps capsule  Commonly known as:  FLOMAX  Take 0.4 mg by mouth at bedtime.     tiotropium 18 MCG inhalation capsule  Commonly known as:   SPIRIVA HANDIHALER  Place 1 capsule (18 mcg total) into inhaler and inhale daily.     torsemide 20 MG tablet  Commonly known as:  DEMADEX  Take 1 tablet (20 mg total) by mouth daily.     VITAMIN B1-B12 IM  Inject into the muscle every 30 (thirty) days. Once a month         [name] 12/17/2013

## 2013-12-17 NOTE — Clinical Social Work Placement (Signed)
Clinical Social Work Department CLINICAL SOCIAL WORK PLACEMENT NOTE 12/17/2013  Patient:  [name], [name]  Account Number:  [id number] Admit date:  12/13/2013  Clinical Social Worker:  Benay Pike, LCSW  Date/time:  12/15/2013 11:39 AM  Clinical Social Work is seeking post-discharge placement for this patient at the following level of care:   [place]   (*CSW will update this form in Epic as items are completed)   12/15/2013  Patient/family provided with [place] Department of Clinical Social Work's list of facilities offering this level of care within the geographic area requested by the patient (or if unable, by the patient's family).  12/15/2013  Patient/family informed of their freedom to choose among providers that offer the needed level of care, that participate in Medicare, Medicaid or managed care program needed by the patient, have an available bed and are willing to accept the patient.  12/15/2013  Patient/family informed of [facility]' ownership interest in [facility], as well as of the fact that they are under no obligation to receive care at this facility.  PASARR submitted to EDS on  PASARR number received on   FL2 transmitted to all facilities in geographic area requested by pt/family on  12/15/2013 FL2 transmitted to all facilities within larger geographic area on   Patient informed that his/her managed care company has contracts with or will negotiate with  certain facilities, including the following:     Patient/family informed of bed offers received:  12/16/2013 Patient chooses bed at [place], [facility] Physician recommends and patient chooses bed at  [place], [facility]  Patient to be transferred to [place], [facility] on  12/17/2013 Patient to be transferred to facility by family Patient and family notified of transfer on 12/17/2013 Name of family member notified:  Curt Bears- daughter-in-law  The following  physician request were entered in Epic:   Additional Comments: Pt has existing pasarr number.  Benay Pike, [place]

## 2013-12-18 LAB — IMMUNOFIXATION ELECTROPHORESIS
IGA: 73 mg/dL (ref 68–379)
IGM, SERUM: 27 mg/dL — AB (ref 41–251)
IgG (Immunoglobin G), Serum: 500 mg/dL — ABNORMAL LOW (ref 650–1600)
TOTAL PROTEIN ELP: 5 g/dL — AB (ref 6.0–8.3)

## 2013-12-18 LAB — SOLUBLE TRANSFERRIN RECEPTOR: Transferrin Receptor, Soluble: 1.29 mg/L (ref 0.76–1.76)

## 2013-12-22 ENCOUNTER — Other Ambulatory Visit: Payer: Self-pay | Admitting: Critical Care Medicine

## 2013-12-22 NOTE — Discharge Summary (Unsigned)
NAMELAROY, MUSTARD NO.:  [id number]  MEDICAL RECORD NO.:  [id number]  LOCATION:                                 FACILITY:  PHYSICIAN:  Paula Compton. Willey Blade, MD       DATE OF BIRTH:  1921/05/01  DATE OF ADMISSION:  12/13/2013 DATE OF DISCHARGE:  12/03/2015LH                              DISCHARGE SUMMARY   ADDENDUM:  [name] has had Hematology assessment as well.  I have discussed his case further with Robynn Pane today.  His erythropoietin level remains pending.  He may require Procrit as an outpatient.  Arrangements have been made for followup in the Hematology Clinic on December 29, 2013.  He will be started on oral iron on discharge.  His B12 level is normal.  He is felt to likely have anemia of chronic disease, but also a component of chronic GI blood loss from an uncertain area in his upper airway.     Paula Compton. Willey Blade, MD     ROF/MEDQ  D:  12/17/2013  T:  12/17/2013  Job:  [id number]

## 2013-12-23 ENCOUNTER — Other Ambulatory Visit: Payer: Self-pay | Admitting: *Deleted

## 2013-12-23 MED ORDER — TIOTROPIUM BROMIDE MONOHYDRATE 18 MCG IN CAPS: 18.0000 ug | ORAL_CAPSULE | Freq: Every day | RESPIRATORY_TRACT | Status: DC

## 2013-12-24 ENCOUNTER — Encounter (HOSPITAL_COMMUNITY): Payer: Self-pay | Admitting: Internal Medicine

## 2013-12-29 ENCOUNTER — Encounter (HOSPITAL_COMMUNITY): Payer: Medicare Other | Attending: Hematology and Oncology

## 2013-12-29 ENCOUNTER — Encounter (HOSPITAL_COMMUNITY): Payer: Self-pay

## 2013-12-29 VITALS — BP 107/52 | HR 73 | Temp 97.4°F | Resp 16 | Wt 168.3 lb

## 2013-12-29 DIAGNOSIS — N185 Chronic kidney disease, stage 5: Secondary | ICD-10-CM | POA: Diagnosis not present

## 2013-12-29 DIAGNOSIS — N400 Enlarged prostate without lower urinary tract symptoms: Secondary | ICD-10-CM | POA: Diagnosis not present

## 2013-12-29 DIAGNOSIS — F0390 Unspecified dementia without behavioral disturbance: Secondary | ICD-10-CM | POA: Insufficient documentation

## 2013-12-29 DIAGNOSIS — D631 Anemia in chronic kidney disease: Secondary | ICD-10-CM | POA: Insufficient documentation

## 2013-12-29 DIAGNOSIS — K589 Irritable bowel syndrome without diarrhea: Secondary | ICD-10-CM | POA: Diagnosis not present

## 2013-12-29 DIAGNOSIS — I10 Essential (primary) hypertension: Secondary | ICD-10-CM | POA: Insufficient documentation

## 2013-12-29 LAB — CBC WITH DIFFERENTIAL/PLATELET
Basophils Absolute: 0 10*3/uL (ref 0.0–0.1)
Basophils Relative: 0 % (ref 0–1)
Eosinophils Absolute: 0.1 10*3/uL (ref 0.0–0.7)
Eosinophils Relative: 2 % (ref 0–5)
HEMATOCRIT: 27.1 % — AB (ref 39.0–52.0)
HEMOGLOBIN: 9 g/dL — AB (ref 13.0–17.0)
LYMPHS ABS: 1.9 10*3/uL (ref 0.7–4.0)
Lymphocytes Relative: 25 % (ref 12–46)
MCH: 32.4 pg (ref 26.0–34.0)
MCHC: 33.2 g/dL (ref 30.0–36.0)
MCV: 97.5 fL (ref 78.0–100.0)
MONOS PCT: 10 % (ref 3–12)
Monocytes Absolute: 0.8 10*3/uL (ref 0.1–1.0)
Neutro Abs: 4.9 10*3/uL (ref 1.7–7.7)
Neutrophils Relative %: 63 % (ref 43–77)
Platelets: 133 10*3/uL — ABNORMAL LOW (ref 150–400)
RBC: 2.78 MIL/uL — AB (ref 4.22–5.81)
RDW: 18.3 % — ABNORMAL HIGH (ref 11.5–15.5)
WBC: 7.7 10*3/uL (ref 4.0–10.5)

## 2013-12-29 MED ORDER — DARBEPOETIN ALFA 500 MCG/ML IJ SOSY
500.0000 ug | PREFILLED_SYRINGE | Freq: Once | INTRAMUSCULAR | Status: AC
  Administered 2013-12-29: 500 ug via SUBCUTANEOUS
  Filled 2013-12-29: qty 1

## 2013-12-29 NOTE — Addendum Note (Signed)
Addended by: Mellissa Kohut on: 12/29/2013 06:56 PM   Modules accepted: Orders

## 2013-12-29 NOTE — Progress Notes (Signed)
[name] presents today for injection per MD orders. Aranesp 500 mcg administered SQ in right Abdomen. Administration without incident. Patient tolerated well.

## 2013-12-29 NOTE — Progress Notes (Signed)
[facility]  OFFICE PROGRESS James Miner, MD [address] [place] [address]  DIAGNOSIS: Anemia in stage 5 chronic kidney disease - Plan: CBC with Differential, CBC with Differential, Consult to nephrology  Chief Complaint  Patient presents with  . Anemia in chronic kidney disease stage V    CURRENT THERAPY: Transfusion on 12/13/2013 for severe anemia, normocytic normochromic with positive stool for occult blood. Additional lab tests done today after hospitalization for discussion of future management.  INTERVAL HISTORY: [name] 80 y.o. male returns for follow-up after recent hospitalization for severe anemia at which time 2 units of packed red blood cells were transfused on 12/13/2013 when hemoglobin was 7. Stools for occult blood were positive. Stage V chronic kidney disease. He was not referred to a nephrologist following his hospitalization. [facility] and intends to be discharged in 4 days. Appetite is good. He is using a walker. He denies any melena, hematochezia, hematuria, epistaxis, or hemoptysis. He denies any diarrhea, constipation, urinary incontinence, lower extremity swelling or redness, PND, orthopnea, palpitations, skin rash, headache, or seizures.  MEDICAL HISTORY: Past Medical History  Diagnosis Date  . Gout   . Stroke 1993, 1995    chronic balance issues  . Hyperlipidemia   . Emphysema   . Hypertension   . CAD (coronary artery disease)     stent 2006  . Seizure disorder   . GERD (gastroesophageal reflux disease)   . Cancer   . Asthma   . Syncope Jan. 1, 2014  . AAA (abdominal aortic aneurysm)   . Sinus node dysfunction     s/p PPM implant April 2015 (ST Jude)  . Pneumonia   . Myocardial infarction   . CHF (congestive heart failure)   . Seizures   . Renal disorder   . CKD (chronic kidney disease) stage 4, GFR 15-29 ml/min 04/17/2013     INTERIM HISTORY: has HYPERLIPIDEMIA; GOUT; HYPERTENSION; CORONARY ATHEROSCLEROSIS NATIVE CORONARY ARTERY; STROKE; CHRONIC RHINITIS; EMPHYSEMA; C O P D gold stage A; GERD; CHEST PAIN; Difficulty in walking; Abnormality of gait; Orthostatic hypotension; AAA (abdominal aortic aneurysm); Abdominal aneurysm without mention of rupture; Constipation; DDD (degenerative disc disease), lumbosacral; Neuropathy; Leg pain; Lumbar herniated disc; Acute lacunar infarction; Pedal edema; Chronic constipation; Aftercare following surgery of the circulatory system, NEC; Syncope; CHF (congestive heart failure); Pneumonitis; Thrombocytopenia; CKD (chronic kidney disease) stage 4, GFR 15-29 ml/min; Hx of TIA (transient ischemic attack) and stroke; Acute CHF; Bradycardia with documented sinus pauses; LBBB (left bundle branch block); Sinus arrest; Atherosclerosis of native arteries of the extremities with intermittent claudication; Peripheral vascular disease, unspecified; Acute kidney injury; Anemia; Hemoptysis; Symptomatic anemia; Occult GI bleeding; and Anemia in stage 5 chronic kidney disease on his problem list.    ALLERGIES:  is allergic to penicillins.  MEDICATIONS: has a current medication list which includes the following prescription(s): allopurinol, aspirin, donepezil, famotidine, ferrous gluconate, isosorbide mononitrate, linaclotide, melatonin, metoprolol tartrate, nitroglycerin, oxcarbazepine, polyethylene glycol powder, pravastatin, tamsulosin, tiotropium, torsemide, vitamin b1-b12, and oxybutynin.  SURGICAL HISTORY:  Past Surgical History  Procedure Laterality Date  . Cardiac catheterization  08/23/2004    bare metal stenting of the proximal circumflex  . Colonoscopy      X 2  . Colonoscopy  11/01/2011    Procedure: COLONOSCOPY;  Surgeon: Rogene Houston, MD;  [facility];  Service: Endoscopy;  Laterality: N/A;  100  .  Eye surgery    . Nose surgery    . Pacemaker insertion  04-29-13    STJ  Assurity dual chamber pacemaker implanted by Dr Rayann Heman for syncope and sinus node dysfunction  . Esophagogastroduodenoscopy N/A 12/16/2013    Procedure: ESOPHAGOGASTRODUODENOSCOPY (EGD);  Surgeon: Rogene Houston, MD;  Location[facility];  Service: Endoscopy;  Laterality: N/A;  . Permanent pacemaker insertion N/A 04/29/2013    Procedure: PERMANENT PACEMAKER INSERTION;  Surgeon: Coralyn Mark, MD;  Location: [facility];  Service: Cardiovascular;  Laterality: N/A;  . Tongue biopsy Right 12/2013    FAMILY HISTORY: family history includes Arthritis in his mother; Cancer in his brother and brother; Diabetes in his brother, brother and another family member; Emphysema in his brother; Heart attack in his sister and another family member; Heart disease in his sister; Rheum arthritis in his father and mother; Stroke in his mother. There is no history of Colon cancer.  SOCIAL HISTORY:  reports that he quit smoking about 23 years ago. His smoking use included Cigarettes. He has a 15 pack-year smoking history. He has never used smokeless tobacco. He reports that he does not drink alcohol or use illicit drugs.  REVIEW OF SYSTEMS:  Other than that discussed above is noncontributory.  PHYSICAL EXAMINATION: ECOG PERFORMANCE STATUS: 1 - Symptomatic but completely ambulatory  Blood pressure 107/52, pulse 73, temperature 97.4 F (36.3 C), temperature source Oral, resp. rate 16, weight 168 lb 4.8 oz (76.34 kg), SpO2 99 %.  GENERAL:alert, no distress and comfortable SKIN: skin color, texture, turgor are normal, no rashes or significant lesions EYES: PERLA; Conjunctiva are pink and non-injected, sclera clear SINUSES: No redness or tenderness over maxillary or ethmoid sinuses OROPHARYNX:no exudate, no erythema on lips, buccal mucosa, or tongue. NECK: supple, thyroid normal size, non-tender, without nodularity. No masses CHEST: Increased AP diameter with no breast masses. LYMPH:  no palpable lymphadenopathy  in the cervical, axillary or inguinal LUNGS: clear to auscultation and percussion with normal breathing effort HEART: regular rate & rhythm and no murmurs. ABDOMEN:abdomen soft, non-tender and normal bowel sounds MUSCULOSKELETAL:no cyanosis of digits and no clubbing. Range of motion normal.  NEURO: alert & oriented x 3 with fluent speech, no focal motor/sensory deficits   LABORATORY DATA:   12/13/2013: Ferritin 351 12/16/2013: BUN 54, creatinine 2.97, GFR 17 12/17/2013: WBC 7.9, hemoglobin 8.8, platelets 169,000.    Office Visit on 12/29/2013  Component Date Value Ref Range Status  . WBC 12/29/2013 7.7  4.0 - 10.5 K/uL Final  . RBC 12/29/2013 2.78* 4.22 - 5.81 MIL/uL Final  . Hemoglobin 12/29/2013 9.0* 13.0 - 17.0 g/dL Final  . HCT 12/29/2013 27.1* 39.0 - 52.0 % Final  . MCV 12/29/2013 97.5  78.0 - 100.0 fL Final  . MCH 12/29/2013 32.4  26.0 - 34.0 pg Final  . MCHC 12/29/2013 33.2  30.0 - 36.0 g/dL Final  . RDW 12/29/2013 18.3* 11.5 - 15.5 % Final  . Platelets 12/29/2013 133* 150 - 400 K/uL Final  . Neutrophils Relative % 12/29/2013 63  43 - 77 % Final  . Neutro Abs 12/29/2013 4.9  1.7 - 7.7 K/uL Final  . Lymphocytes Relative 12/29/2013 25  12 - 46 % Final  . Lymphs Abs 12/29/2013 1.9  0.7 - 4.0 K/uL Final  . Monocytes Relative 12/29/2013 10  3 - 12 % Final  . Monocytes Absolute 12/29/2013 0.8  0.1 - 1.0 K/uL Final  . Eosinophils Relative 12/29/2013 2  0 - 5 % Final  .  Eosinophils Absolute 12/29/2013 0.1  0.0 - 0.7 K/uL Final  . Basophils Relative 12/29/2013 0  0 - 1 % Final  . Basophils Absolute 12/29/2013 0.0  0.0 - 0.1 K/uL Final  Admission on 12/13/2013, Discharged on 12/17/2013  No results displayed because visit has over 200 results.      PATHOLOGY: No pathology.  Urinalysis    Component Value Date/Time   COLORURINE YELLOW 12/13/2013 1700   APPEARANCEUR CLEAR 12/13/2013 1700   LABSPEC 1.010 12/13/2013 1700   PHURINE 6.5 12/13/2013 1700   GLUCOSEU 250*  12/13/2013 1700   HGBUR TRACE* 12/13/2013 1700   BILIRUBINUR NEGATIVE 12/13/2013 1700   KETONESUR NEGATIVE 12/13/2013 1700   PROTEINUR NEGATIVE 12/13/2013 1700   UROBILINOGEN 0.2 12/13/2013 1700   NITRITE NEGATIVE 12/13/2013 1700   LEUKOCYTESUR NEGATIVE 12/13/2013 1700    RADIOGRAPHIC STUDIES: Dg Chest 2 View  12/13/2013   CLINICAL DATA:  Recent stroke with residual weakness.  EXAM: CHEST  2 VIEW  COMPARISON:  10/18/2013  FINDINGS: Stable position of the left cardiac dual chamber pacemaker. The lungs are clear. Heart size is mildly enlarged. The trachea is midline. Few densities in the left lower chest could represent atelectasis.  IMPRESSION: No acute chest findings.   Electronically Signed   By: Markus Daft M.D.   On: 12/13/2013 16:32   Ct Chest Wo Contrast  12/16/2013   CLINICAL DATA:  Hemoptysis. Emphysema. Coronary artery disease. Hypertension. Stage IV chronic renal disease.  EXAM: CT CHEST WITHOUT CONTRAST  TECHNIQUE: Multidetector CT imaging of the chest was performed following the standard protocol without IV contrast.  COMPARISON:  Multiple exams, including 10/20/2013  FINDINGS: Coronary and aortic atherosclerosis with mild cardiomegaly. Low-density blood pool. Pacer lead positioning unchanged.  Small bilateral pleural effusions, reduced in size compared to prior. Small to moderate pericardial effusion.  There is evidence of an abdominal aortic aneurysm on the lowest most images, not completely included but measuring at least 5.7 cm in AP dimension.  Scattered small mediastinal lymph nodes are present.  Centrilobular emphysema observed with secondary pulmonary lobular septal accentuation as well as some faint scattered ground-glass/mosaic attenuation in the lungs. Cylindrical bronchiectasis in the lower lobes. Old granulomatous disease noted.  IMPRESSION: 1. Centrilobular emphysema with a suggestion of mild superimposed interstitial pulmonary edema, and small bilateral pleural effusions. 2.  Moderate pericardial effusion with mild cardiomegaly. Low-density blood pool suggests anemia. 3. Partial visualization of an abdominal aortic aneurysm - if not recently worked up think consider abdominal ultrasound or CT.   Electronically Signed   By: Sherryl Barters M.D.   On: 12/16/2013 17:37    ASSESSMENT:  #1. Anemia in chronic kidney disease, stage V. #2. Hypertension, controlled. #3. Benign prostatic hypertrophy, on treatment, controlled. #4. Irritable bowel syndrome, constipating type. #5. Mild dementia, on Aricept.   PLAN:  #1. Aranesp 500 g subcutaneously every 3 weeks if hemoglobin less than 11. #2. Follow-up in 9 weeks with CBC, ferritin #3. Refer to nephrology.   All questions were answered. The patient knows to call the clinic with any problems, questions or concerns. We can certainly see the patient much sooner if necessary.   I spent 25 minutes counseling the patient face to face. The total time spent in the appointment was 30 minutes.    Doroteo Bradford, MD 12/29/2013 11:20 AM  DISCLAIMER:  This note was dictated with voice recognition software.  Similar sounding words can inadvertently be transcribed inaccurately and may not be corrected upon review.   kc

## 2013-12-29 NOTE — Patient Instructions (Addendum)
[facility] Discharge Instructions  RECOMMENDATIONS MADE BY THE CONSULTANT AND ANY TEST RESULTS WILL BE SENT TO YOUR REFERRING PHYSICIAN.  EXAM FINDINGS BY THE PHYSICIAN TODAY AND SIGNS OR SYMPTOMS TO REPORT TO CLINIC OR PRIMARY PHYSICIAN: Exam and findings as discussed by Dr. Barnet Glasgow.  We will check your blood counts today and if your hemoglobin is less than 11 grams we will give you aranesp.  Report increased fatigue or increased shortness of breath.   Results for [name], [name] (MRN [id number]) as of 12/29/2013 11:15  Ref. Range 12/29/2013 10:58  WBC Latest Range: 4.0-10.5 K/uL 7.7  RBC Latest Range: 4.22-5.81 MIL/uL 2.78 (L)  Hemoglobin Latest Range: 13.0-17.0 g/dL 9.0 (L)  HCT Latest Range: 39.0-52.0 % 27.1 (L)  MCV Latest Range: 78.0-100.0 fL 97.5  MCH Latest Range: 26.0-34.0 pg 32.4  MCHC Latest Range: 30.0-36.0 g/dL 33.2  RDW Latest Range: 11.5-15.5 % 18.3 (H)  Platelets Latest Range: 150-400 K/uL 133 (L)  Neutrophils Relative % Latest Range: 43-77 % 63  Lymphocytes Relative Latest Range: 12-46 % 25  Monocytes Relative Latest Range: 3-12 % 10  Eosinophils Relative Latest Range: 0-5 % 2  Basophils Relative Latest Range: 0-1 % 0  NEUT# Latest Range: 1.7-7.7 K/uL 4.9  Lymphocytes Absolute Latest Range: 0.7-4.0 K/uL 1.9  Monocytes Absolute Latest Range: 0.1-1.0 K/uL 0.8  Eosinophils Absolute Latest Range: 0.0-0.7 K/uL 0.1  Basophils Absolute Latest Range: 0.0-0.1 K/uL 0.0    INSTRUCTIONS/FOLLOW-UP: You will get labs every 3 weeks and if your hemoglobin is below 11 grams you will get aranesp injections. Office visit in 9 weeks.  Thank you for choosing [facility] to provide your oncology and hematology care.  To afford each patient quality time with our providers, please arrive at least 15 minutes before your scheduled appointment time.  With your help, our goal is to use those 15 minutes to complete the necessary work-up to ensure our  physicians have the information they need to help with your evaluation and healthcare recommendations.    Effective January 1st, 2014, we ask that you re-schedule your appointment with our physicians should you arrive 10 or more minutes late for your appointment.  We strive to give you quality time with our providers, and arriving late affects you and other patients whose appointments are after yours.    Again, thank you for choosing [facility].  Our hope is that these requests will decrease the amount of time that you wait before being seen by our physicians.       _____________________________________________________________  Should you have questions after your visit to [facility], please contact our office at [phone] between the hours of 8:30 a.m. and 4:30 p.m.  Voicemails left after 4:30 p.m. will not be returned until the following business day.  For prescription refill requests, have your pharmacy contact our office with your prescription refill request.    _______________________________________________________________  We hope that we have given you very good care.  You may receive a patient satisfaction survey in the mail, please complete it and return it as soon as possible.  We value your feedback!  _______________________________________________________________  Have you asked about our STAR program?  STAR stands for Survivorship Training and Rehabilitation, and this is a nationally recognized cancer care program that focuses on survivorship and rehabilitation.  Cancer and cancer treatments may cause problems, such as, pain, making you feel tired and keeping you from doing the things that you need  or want to do. Cancer rehabilitation can help. Our goal is to reduce these troubling effects and help you have the best quality of life possible.  You may receive a survey from a nurse that asks questions about your current state of health.  Based on the  survey results, all eligible patients will be referred to the [facility] program for an evaluation so we can better serve you!  A frequently asked questions sheet is available upon request. Darbepoetin Alfa injection What is this medicine? DARBEPOETIN ALFA (dar be POE e tin AL fa) helps your body make more red blood cells. It is used to treat anemia caused by chronic kidney failure and chemotherapy. This medicine may be used for other purposes; ask your health care provider or pharmacist if you have questions. COMMON BRAND NAME(S): Aranesp What should I tell my health care provider before I take this medicine? They need to know if you have any of these conditions: -blood clotting disorders or history of blood clots -cancer patient not on chemotherapy -cystic fibrosis -heart disease, such as angina, heart failure, or a history of a heart attack -hemoglobin level of 12 g/dL or greater -high blood pressure -low levels of folate, iron, or vitamin B12 -seizures -an unusual or allergic reaction to darbepoetin, erythropoietin, albumin, hamster proteins, latex, other medicines, foods, dyes, or preservatives -pregnant or trying to get pregnant -breast-feeding How should I use this medicine? This medicine is for injection into a vein or under the skin. It is usually given by a health care professional in a hospital or clinic setting. If you get this medicine at home, you will be taught how to prepare and give this medicine. Do not shake the solution before you withdraw a dose. Use exactly as directed. Take your medicine at regular intervals. Do not take your medicine more often than directed. It is important that you put your used needles and syringes in a special sharps container. Do not put them in a trash can. If you do not have a sharps container, call your pharmacist or healthcare provider to get one. Talk to your pediatrician regarding the use of this medicine in children. While this medicine may be  used in children as young as 1 year for selected conditions, precautions do apply. Overdosage: If you think you have taken too much of this medicine contact a poison control center or emergency room at once. NOTE: This medicine is only for you. Do not share this medicine with others. What if I miss a dose? If you miss a dose, take it as soon as you can. If it is almost time for your next dose, take only that dose. Do not take double or extra doses. What may interact with this medicine? Do not take this medicine with any of the following medications: -epoetin alfa This list may not describe all possible interactions. Give your health care provider a list of all the medicines, herbs, non-prescription drugs, or dietary supplements you use. Also tell them if you smoke, drink alcohol, or use illegal drugs. Some items may interact with your medicine. What should I watch for while using this medicine? Visit your prescriber or health care professional for regular checks on your progress and for the needed blood tests and blood pressure measurements. It is especially important for the doctor to make sure your hemoglobin level is in the desired range, to limit the risk of potential side effects and to give you the best benefit. Keep all appointments for any recommended  tests. Check your blood pressure as directed. Ask your doctor what your blood pressure should be and when you should contact him or her. As your body makes more red blood cells, you may need to take iron, folic acid, or vitamin B supplements. Ask your doctor or health care provider which products are right for you. If you have kidney disease continue dietary restrictions, even though this medication can make you feel better. Talk with your doctor or health care professional about the foods you eat and the vitamins that you take. What side effects may I notice from receiving this medicine? Side effects that you should report to your doctor or health  care professional as soon as possible: -allergic reactions like skin rash, itching or hives, swelling of the face, lips, or tongue -breathing problems -changes in vision -chest pain -confusion, trouble speaking or understanding -feeling faint or lightheaded, falls -high blood pressure -muscle aches or pains -pain, swelling, warmth in the leg -rapid weight gain -severe headaches -sudden numbness or weakness of the face, arm or leg -trouble walking, dizziness, loss of balance or coordination -seizures (convulsions) -swelling of the ankles, feet, hands -unusually weak or tired Side effects that usually do not require medical attention (report to your doctor or health care professional if they continue or are bothersome): -diarrhea -fever, chills (flu-like symptoms) -headaches -nausea, vomiting -redness, stinging, or swelling at site where injected This list may not describe all possible side effects. Call your doctor for medical advice about side effects. You may report side effects to FDA at 1-800-FDA-1088. Where should I keep my medicine? Keep out of the reach of children. Store in a refrigerator between 2 and 8 degrees C (36 and 46 degrees F). Do not freeze. Do not shake. Throw away any unused portion if using a single-dose vial. Throw away any unused medicine after the expiration date. NOTE: This sheet is a summary. It may not cover all possible information. If you have questions about this medicine, talk to your doctor, pharmacist, or health care provider.  2015, Elsevier/Gold Standard. (2007-12-16 10:23:57)

## 2014-01-05 ENCOUNTER — Ambulatory Visit: Payer: Medicare Other | Admitting: Cardiology

## 2014-01-19 ENCOUNTER — Encounter (HOSPITAL_COMMUNITY): Payer: Medicare Other | Attending: Hematology and Oncology

## 2014-01-19 ENCOUNTER — Encounter (HOSPITAL_BASED_OUTPATIENT_CLINIC_OR_DEPARTMENT_OTHER): Payer: Medicare Other

## 2014-01-19 DIAGNOSIS — I10 Essential (primary) hypertension: Secondary | ICD-10-CM | POA: Insufficient documentation

## 2014-01-19 DIAGNOSIS — K589 Irritable bowel syndrome without diarrhea: Secondary | ICD-10-CM | POA: Diagnosis not present

## 2014-01-19 DIAGNOSIS — D631 Anemia in chronic kidney disease: Secondary | ICD-10-CM | POA: Diagnosis present

## 2014-01-19 DIAGNOSIS — N185 Chronic kidney disease, stage 5: Secondary | ICD-10-CM | POA: Diagnosis not present

## 2014-01-19 DIAGNOSIS — F0390 Unspecified dementia without behavioral disturbance: Secondary | ICD-10-CM | POA: Diagnosis not present

## 2014-01-19 DIAGNOSIS — N400 Enlarged prostate without lower urinary tract symptoms: Secondary | ICD-10-CM | POA: Diagnosis not present

## 2014-01-19 LAB — CBC
HEMATOCRIT: 27 % — AB (ref 39.0–52.0)
HEMOGLOBIN: 8.8 g/dL — AB (ref 13.0–17.0)
MCH: 32.7 pg (ref 26.0–34.0)
MCHC: 32.6 g/dL (ref 30.0–36.0)
MCV: 100.4 fL — ABNORMAL HIGH (ref 78.0–100.0)
Platelets: 133 10*3/uL — ABNORMAL LOW (ref 150–400)
RBC: 2.69 MIL/uL — ABNORMAL LOW (ref 4.22–5.81)
RDW: 20.6 % — ABNORMAL HIGH (ref 11.5–15.5)
WBC: 8.1 10*3/uL (ref 4.0–10.5)

## 2014-01-19 MED ORDER — DARBEPOETIN ALFA 100 MCG/0.5ML IJ SOSY
60.0000 ug | PREFILLED_SYRINGE | Freq: Once | INTRAMUSCULAR | Status: AC
  Administered 2014-01-19: 60 ug via SUBCUTANEOUS
  Filled 2014-01-19: qty 0.5

## 2014-01-19 NOTE — Progress Notes (Signed)
[name] presents today for injection per MD orders. Aranesp 60 mcg administered SQ in right Abdomen. Administration without incident. Patient tolerated well.

## 2014-01-19 NOTE — Progress Notes (Signed)
LABS FOR CBC 

## 2014-02-05 ENCOUNTER — Ambulatory Visit: Payer: Medicare Other | Admitting: Cardiology

## 2014-02-08 ENCOUNTER — Encounter: Payer: Self-pay | Admitting: Internal Medicine

## 2014-02-08 ENCOUNTER — Ambulatory Visit (INDEPENDENT_AMBULATORY_CARE_PROVIDER_SITE_OTHER): Payer: Medicare Other | Admitting: *Deleted

## 2014-02-08 DIAGNOSIS — I455 Other specified heart block: Secondary | ICD-10-CM

## 2014-02-08 LAB — MDC_IDC_ENUM_SESS_TYPE_REMOTE
Battery Remaining Longevity: 124 mo
Battery Remaining Percentage: 95.5 %
Brady Statistic AP VP Percent: 1 %
Brady Statistic AP VS Percent: 1 %
Brady Statistic AS VS Percent: 98 %
Brady Statistic RA Percent Paced: 1 %
Implantable Pulse Generator Model: 2240
Lead Channel Sensing Intrinsic Amplitude: 2.9 mV
Lead Channel Setting Pacing Amplitude: 2.5 V
Lead Channel Setting Pacing Pulse Width: 0.5 ms
MDC IDC MSMT BATTERY VOLTAGE: 3.02 V
MDC IDC MSMT LEADCHNL RA IMPEDANCE VALUE: 360 Ohm
MDC IDC MSMT LEADCHNL RV IMPEDANCE VALUE: 490 Ohm
MDC IDC MSMT LEADCHNL RV SENSING INTR AMPL: 5.5 mV
MDC IDC PG SERIAL: 3013737
MDC IDC SESS DTM: 20160125070016
MDC IDC SET LEADCHNL RA PACING AMPLITUDE: 2 V
MDC IDC SET LEADCHNL RV SENSING SENSITIVITY: 2 mV
MDC IDC STAT BRADY AS VP PERCENT: 1 %
MDC IDC STAT BRADY RV PERCENT PACED: 1.5 %

## 2014-02-08 NOTE — Progress Notes (Signed)
Remote pacemaker transmission.   

## 2014-02-09 ENCOUNTER — Encounter (HOSPITAL_COMMUNITY): Payer: Medicare Other | Attending: Hematology & Oncology

## 2014-02-09 DIAGNOSIS — D631 Anemia in chronic kidney disease: Secondary | ICD-10-CM

## 2014-02-09 DIAGNOSIS — N185 Chronic kidney disease, stage 5: Secondary | ICD-10-CM

## 2014-02-09 LAB — CBC
HCT: 21.9 % — ABNORMAL LOW (ref 39.0–52.0)
HEMOGLOBIN: 7.1 g/dL — AB (ref 13.0–17.0)
MCH: 32.7 pg (ref 26.0–34.0)
MCHC: 32.4 g/dL (ref 30.0–36.0)
MCV: 100.9 fL — ABNORMAL HIGH (ref 78.0–100.0)
PLATELETS: 142 10*3/uL — AB (ref 150–400)
RBC: 2.17 MIL/uL — AB (ref 4.22–5.81)
RDW: 18.1 % — AB (ref 11.5–15.5)
WBC: 7.8 10*3/uL (ref 4.0–10.5)

## 2014-02-09 LAB — PREPARE RBC (CROSSMATCH)

## 2014-02-09 MED ORDER — DARBEPOETIN ALFA 60 MCG/0.3ML IJ SOSY
60.0000 ug | PREFILLED_SYRINGE | Freq: Once | INTRAMUSCULAR | Status: AC
  Administered 2014-02-09: 60 ug via SUBCUTANEOUS
  Filled 2014-02-09: qty 0.3

## 2014-02-09 NOTE — Progress Notes (Signed)
Blood band applied by Pam from lab.

## 2014-02-09 NOTE — Addendum Note (Signed)
Addended by: Gerhard Perches on: 02/09/2014 11:58 AM   Modules accepted: Orders, SmartSet

## 2014-02-09 NOTE — Progress Notes (Signed)
[name] presents today for injection per MD orders. Aranesp 9mcg administered SQ in right Abdomen. Administration without incident. Patient tolerated well.  Pt scheduled to receive 1 unit of PRBC tomorrow 1/27 @ 12:15. Daughter reports that patient has started back coughing up some blood. Pt saw Dr. Willey Blade yday. Pt to return in 2 weeks for CBC and Aranesp injection. We are to tell Dr. Whitney Muse of HGB results prior to giving Aranesp because she may adjust the dosage based on HGB results.

## 2014-02-10 ENCOUNTER — Encounter (HOSPITAL_BASED_OUTPATIENT_CLINIC_OR_DEPARTMENT_OTHER): Payer: Medicare Other

## 2014-02-10 DIAGNOSIS — N185 Chronic kidney disease, stage 5: Secondary | ICD-10-CM

## 2014-02-10 DIAGNOSIS — D631 Anemia in chronic kidney disease: Secondary | ICD-10-CM

## 2014-02-10 MED ORDER — DIPHENHYDRAMINE HCL 25 MG PO CAPS
25.0000 mg | ORAL_CAPSULE | Freq: Once | ORAL | Status: AC
  Administered 2014-02-10: 25 mg via ORAL

## 2014-02-10 MED ORDER — ACETAMINOPHEN 325 MG PO TABS
650.0000 mg | ORAL_TABLET | Freq: Once | ORAL | Status: AC
  Administered 2014-02-10: 650 mg via ORAL

## 2014-02-10 MED ORDER — SODIUM CHLORIDE 0.9 % IV SOLN
250.0000 mL | Freq: Once | INTRAVENOUS | Status: AC
  Administered 2014-02-10: 250 mL via INTRAVENOUS

## 2014-02-10 MED ORDER — DIPHENHYDRAMINE HCL 25 MG PO CAPS
ORAL_CAPSULE | ORAL | Status: AC
  Filled 2014-02-10: qty 1

## 2014-02-10 MED ORDER — SODIUM CHLORIDE 0.9 % IJ SOLN
10.0000 mL | INTRAMUSCULAR | Status: AC | PRN
  Administered 2014-02-10: 10 mL

## 2014-02-10 MED ORDER — ACETAMINOPHEN 325 MG PO TABS
ORAL_TABLET | ORAL | Status: AC
  Filled 2014-02-10: qty 2

## 2014-02-10 NOTE — Patient Instructions (Signed)
[facility] at [facility] Discharge Instructions  RECOMMENDATIONS MADE BY THE CONSULTANT AND ANY TEST RESULTS WILL BE SENT TO YOUR REFERRING PHYSICIAN.  Today you received a blood transfusion. Return as scheduled.  Thank you for choosing [facility] at [facility] to provide your oncology and hematology care.  To afford each patient quality time with our provider, please arrive at least 15 minutes before your scheduled appointment time.    You need to re-schedule your appointment should you arrive 10 or more minutes late.  We strive to give you quality time with our providers, and arriving late affects you and other patients whose appointments are after yours.  Also, if you no show three or more times for appointments you may be dismissed from the clinic at the providers discretion.     Again, thank you for choosing [facility].  Our hope is that these requests will decrease the amount of time that you wait before being seen by our physicians.       _____________________________________________________________  Should you have questions after your visit to [facility], please contact our office at [phone] between the hours of 8:30 a.m. and 4:30 p.m.  Voicemails left after 4:30 p.m. will not be returned until the following business day.  For prescription refill requests, have your pharmacy contact our office.

## 2014-02-10 NOTE — Progress Notes (Signed)
Tolerated blood transfusion without incidence.

## 2014-02-11 LAB — TYPE AND SCREEN
ABO/RH(D): A POS
ANTIBODY SCREEN: NEGATIVE
Unit division: 0

## 2014-02-12 ENCOUNTER — Encounter: Payer: Self-pay | Admitting: *Deleted

## 2014-02-17 ENCOUNTER — Encounter: Payer: Self-pay | Admitting: Cardiology

## 2014-02-17 ENCOUNTER — Encounter: Payer: Self-pay | Admitting: Hematology & Oncology

## 2014-02-20 ENCOUNTER — Encounter (HOSPITAL_COMMUNITY): Payer: Self-pay | Admitting: Emergency Medicine

## 2014-02-20 ENCOUNTER — Inpatient Hospital Stay (HOSPITAL_COMMUNITY)
Admission: EM | Admit: 2014-02-20 | Discharge: 2014-02-25 | DRG: 178 | Disposition: A | Discharge status: Skilled Nursing Facility | Payer: Medicare Other | Attending: Internal Medicine | Admitting: Internal Medicine

## 2014-02-20 ENCOUNTER — Inpatient Hospital Stay (HOSPITAL_COMMUNITY): Payer: Medicare Other

## 2014-02-20 ENCOUNTER — Emergency Department (HOSPITAL_COMMUNITY): Payer: Medicare Other

## 2014-02-20 DIAGNOSIS — Z950 Presence of cardiac pacemaker: Secondary | ICD-10-CM

## 2014-02-20 DIAGNOSIS — Z833 Family history of diabetes mellitus: Secondary | ICD-10-CM

## 2014-02-20 DIAGNOSIS — N289 Disorder of kidney and ureter, unspecified: Secondary | ICD-10-CM

## 2014-02-20 DIAGNOSIS — N390 Urinary tract infection, site not specified: Secondary | ICD-10-CM | POA: Diagnosis present

## 2014-02-20 DIAGNOSIS — Z809 Family history of malignant neoplasm, unspecified: Secondary | ICD-10-CM | POA: Diagnosis not present

## 2014-02-20 DIAGNOSIS — N185 Chronic kidney disease, stage 5: Secondary | ICD-10-CM | POA: Diagnosis present

## 2014-02-20 DIAGNOSIS — J45909 Unspecified asthma, uncomplicated: Secondary | ICD-10-CM | POA: Diagnosis present

## 2014-02-20 DIAGNOSIS — K922 Gastrointestinal hemorrhage, unspecified: Secondary | ICD-10-CM | POA: Diagnosis not present

## 2014-02-20 DIAGNOSIS — Z823 Family history of stroke: Secondary | ICD-10-CM | POA: Diagnosis not present

## 2014-02-20 DIAGNOSIS — R110 Nausea: Secondary | ICD-10-CM

## 2014-02-20 DIAGNOSIS — J690 Pneumonitis due to inhalation of food and vomit: Principal | ICD-10-CM | POA: Diagnosis present

## 2014-02-20 DIAGNOSIS — N179 Acute kidney failure, unspecified: Secondary | ICD-10-CM | POA: Diagnosis present

## 2014-02-20 DIAGNOSIS — J90 Pleural effusion, not elsewhere classified: Secondary | ICD-10-CM | POA: Diagnosis not present

## 2014-02-20 DIAGNOSIS — Z8673 Personal history of transient ischemic attack (TIA), and cerebral infarction without residual deficits: Secondary | ICD-10-CM | POA: Diagnosis not present

## 2014-02-20 DIAGNOSIS — I252 Old myocardial infarction: Secondary | ICD-10-CM

## 2014-02-20 DIAGNOSIS — K219 Gastro-esophageal reflux disease without esophagitis: Secondary | ICD-10-CM | POA: Diagnosis present

## 2014-02-20 DIAGNOSIS — R1310 Dysphagia, unspecified: Secondary | ICD-10-CM

## 2014-02-20 DIAGNOSIS — E860 Dehydration: Secondary | ICD-10-CM | POA: Diagnosis present

## 2014-02-20 DIAGNOSIS — N508 Other specified disorders of male genital organs: Secondary | ICD-10-CM | POA: Diagnosis present

## 2014-02-20 DIAGNOSIS — D509 Iron deficiency anemia, unspecified: Secondary | ICD-10-CM | POA: Diagnosis present

## 2014-02-20 DIAGNOSIS — N400 Enlarged prostate without lower urinary tract symptoms: Secondary | ICD-10-CM | POA: Diagnosis present

## 2014-02-20 DIAGNOSIS — J449 Chronic obstructive pulmonary disease, unspecified: Secondary | ICD-10-CM | POA: Diagnosis present

## 2014-02-20 DIAGNOSIS — E119 Type 2 diabetes mellitus without complications: Secondary | ICD-10-CM | POA: Diagnosis present

## 2014-02-20 DIAGNOSIS — Z87891 Personal history of nicotine dependence: Secondary | ICD-10-CM | POA: Diagnosis not present

## 2014-02-20 DIAGNOSIS — Z880 Allergy status to penicillin: Secondary | ICD-10-CM

## 2014-02-20 DIAGNOSIS — D696 Thrombocytopenia, unspecified: Secondary | ICD-10-CM | POA: Diagnosis present

## 2014-02-20 DIAGNOSIS — I2510 Atherosclerotic heart disease of native coronary artery without angina pectoris: Secondary | ICD-10-CM | POA: Diagnosis present

## 2014-02-20 DIAGNOSIS — E785 Hyperlipidemia, unspecified: Secondary | ICD-10-CM | POA: Diagnosis present

## 2014-02-20 DIAGNOSIS — R0902 Hypoxemia: Secondary | ICD-10-CM | POA: Diagnosis present

## 2014-02-20 DIAGNOSIS — K5900 Constipation, unspecified: Secondary | ICD-10-CM | POA: Diagnosis present

## 2014-02-20 DIAGNOSIS — R531 Weakness: Secondary | ICD-10-CM | POA: Diagnosis present

## 2014-02-20 DIAGNOSIS — D649 Anemia, unspecified: Secondary | ICD-10-CM | POA: Diagnosis not present

## 2014-02-20 DIAGNOSIS — I120 Hypertensive chronic kidney disease with stage 5 chronic kidney disease or end stage renal disease: Secondary | ICD-10-CM | POA: Diagnosis present

## 2014-02-20 DIAGNOSIS — D631 Anemia in chronic kidney disease: Secondary | ICD-10-CM | POA: Diagnosis present

## 2014-02-20 DIAGNOSIS — R05 Cough: Secondary | ICD-10-CM

## 2014-02-20 DIAGNOSIS — I5032 Chronic diastolic (congestive) heart failure: Secondary | ICD-10-CM | POA: Diagnosis present

## 2014-02-20 DIAGNOSIS — R059 Cough, unspecified: Secondary | ICD-10-CM

## 2014-02-20 DIAGNOSIS — Z8249 Family history of ischemic heart disease and other diseases of the circulatory system: Secondary | ICD-10-CM | POA: Diagnosis not present

## 2014-02-20 DIAGNOSIS — Z825 Family history of asthma and other chronic lower respiratory diseases: Secondary | ICD-10-CM

## 2014-02-20 DIAGNOSIS — I214 Non-ST elevation (NSTEMI) myocardial infarction: Secondary | ICD-10-CM

## 2014-02-20 LAB — COMPREHENSIVE METABOLIC PANEL
ALBUMIN: 3.1 g/dL — AB (ref 3.5–5.2)
ALK PHOS: 101 U/L (ref 39–117)
ALT: 13 U/L (ref 0–53)
AST: 18 U/L (ref 0–37)
Anion gap: 5 (ref 5–15)
BILIRUBIN TOTAL: 0.7 mg/dL (ref 0.3–1.2)
BUN: 48 mg/dL — ABNORMAL HIGH (ref 6–23)
CALCIUM: 8.6 mg/dL (ref 8.4–10.5)
CO2: 30 mmol/L (ref 19–32)
CREATININE: 4.93 mg/dL — AB (ref 0.50–1.35)
Chloride: 106 mmol/L (ref 96–112)
GFR calc Af Amer: 11 mL/min — ABNORMAL LOW (ref >90)
GFR calc non Af Amer: 9 mL/min — ABNORMAL LOW (ref >90)
Glucose, Bld: 103 mg/dL — ABNORMAL HIGH (ref 70–99)
Potassium: 3.9 mmol/L (ref 3.5–5.1)
Sodium: 141 mmol/L (ref 135–145)
Total Protein: 5.9 g/dL — ABNORMAL LOW (ref 6.0–8.3)

## 2014-02-20 LAB — URINALYSIS, ROUTINE W REFLEX MICROSCOPIC
BILIRUBIN URINE: NEGATIVE
Glucose, UA: NEGATIVE mg/dL
Ketones, ur: NEGATIVE mg/dL
NITRITE: NEGATIVE
SPECIFIC GRAVITY, URINE: 1.02 (ref 1.005–1.030)
UROBILINOGEN UA: 0.2 mg/dL (ref 0.0–1.0)
pH: 5.5 (ref 5.0–8.0)

## 2014-02-20 LAB — CBC WITH DIFFERENTIAL/PLATELET
BASOS ABS: 0 10*3/uL (ref 0.0–0.1)
Basophils Relative: 0 % (ref 0–1)
EOS ABS: 0.3 10*3/uL (ref 0.0–0.7)
EOS PCT: 5 % (ref 0–5)
HEMATOCRIT: 26 % — AB (ref 39.0–52.0)
Hemoglobin: 8.4 g/dL — ABNORMAL LOW (ref 13.0–17.0)
LYMPHS ABS: 2 10*3/uL (ref 0.7–4.0)
Lymphocytes Relative: 31 % (ref 12–46)
MCH: 32.2 pg (ref 26.0–34.0)
MCHC: 32.3 g/dL (ref 30.0–36.0)
MCV: 99.6 fL (ref 78.0–100.0)
MONO ABS: 0.6 10*3/uL (ref 0.1–1.0)
Monocytes Relative: 10 % (ref 3–12)
NEUTROS ABS: 3.4 10*3/uL (ref 1.7–7.7)
Neutrophils Relative %: 53 % (ref 43–77)
Platelets: 75 10*3/uL — ABNORMAL LOW (ref 150–400)
RBC: 2.61 MIL/uL — AB (ref 4.22–5.81)
RDW: 19.2 % — ABNORMAL HIGH (ref 11.5–15.5)
WBC: 6.4 10*3/uL (ref 4.0–10.5)

## 2014-02-20 LAB — LIPASE, BLOOD: Lipase: 24 U/L (ref 11–59)

## 2014-02-20 LAB — URINE MICROSCOPIC-ADD ON

## 2014-02-20 LAB — PROTIME-INR
INR: 1.03 (ref 0.00–1.49)
Prothrombin Time: 13.6 seconds (ref 11.6–15.2)

## 2014-02-20 LAB — PREPARE RBC (CROSSMATCH)

## 2014-02-20 LAB — TROPONIN I
Troponin I: 0.24 ng/mL — ABNORMAL HIGH (ref <0.031)
Troponin I: 0.26 ng/mL — ABNORMAL HIGH (ref <0.031)
Troponin I: 0.27 ng/mL — ABNORMAL HIGH (ref <0.031)

## 2014-02-20 MED ORDER — ACETAMINOPHEN 650 MG RE SUPP
RECTAL | Status: AC
  Filled 2014-02-20: qty 1

## 2014-02-20 MED ORDER — OXCARBAZEPINE 300 MG PO TABS
ORAL_TABLET | ORAL | Status: AC
  Filled 2014-02-20: qty 1

## 2014-02-20 MED ORDER — DEXTROSE 5 % IV SOLN: 1.0000 g | Freq: Three times a day (TID) | INTRAVENOUS | Status: DC

## 2014-02-20 MED ORDER — SODIUM CHLORIDE 0.9 % IV SOLN
250.0000 mg | Freq: Once | INTRAVENOUS | Status: DC
  Filled 2014-02-20: qty 250

## 2014-02-20 MED ORDER — ONDANSETRON HCL 4 MG PO TABS
4.0000 mg | ORAL_TABLET | Freq: Four times a day (QID) | ORAL | Status: DC | PRN
  Administered 2014-02-21 – 2014-02-22 (×2): 4 mg via ORAL
  Filled 2014-02-20 (×2): qty 1

## 2014-02-20 MED ORDER — METRONIDAZOLE IN NACL 5-0.79 MG/ML-% IV SOLN
500.0000 mg | Freq: Three times a day (TID) | INTRAVENOUS | Status: DC
  Administered 2014-02-20 – 2014-02-25 (×15): 500 mg via INTRAVENOUS
  Filled 2014-02-20 (×15): qty 100

## 2014-02-20 MED ORDER — ONDANSETRON HCL 4 MG/2ML IJ SOLN
4.0000 mg | Freq: Once | INTRAMUSCULAR | Status: AC
  Administered 2014-02-20: 4 mg via INTRAVENOUS
  Filled 2014-02-20: qty 2

## 2014-02-20 MED ORDER — SODIUM CHLORIDE 0.9 % IJ SOLN
3.0000 mL | Freq: Two times a day (BID) | INTRAMUSCULAR | Status: DC
Start: 2014-02-20 — End: 2014-02-25
  Administered 2014-02-21 – 2014-02-23 (×4): 3 mL via INTRAVENOUS

## 2014-02-20 MED ORDER — SODIUM CHLORIDE 0.9 % IV SOLN
Freq: Once | INTRAVENOUS | Status: AC
Start: 2014-02-20 — End: 2014-02-20
  Administered 2014-02-20: 100 mL/h via INTRAVENOUS

## 2014-02-20 MED ORDER — METOPROLOL TARTRATE 25 MG PO TABS
12.5000 mg | ORAL_TABLET | Freq: Every day | ORAL | Status: DC
  Administered 2014-02-21 – 2014-02-25 (×5): 12.5 mg via ORAL
  Filled 2014-02-20 (×5): qty 1

## 2014-02-20 MED ORDER — TAMSULOSIN HCL 0.4 MG PO CAPS
0.4000 mg | ORAL_CAPSULE | Freq: Every day | ORAL | Status: DC
  Administered 2014-02-20 – 2014-02-24 (×5): 0.4 mg via ORAL
  Filled 2014-02-20 (×5): qty 1

## 2014-02-20 MED ORDER — PANTOPRAZOLE SODIUM 40 MG IV SOLR: 40.0000 mg | Freq: Two times a day (BID) | INTRAVENOUS | Status: DC

## 2014-02-20 MED ORDER — NITROGLYCERIN 0.4 MG SL SUBL: 0.4000 mg | SUBLINGUAL_TABLET | SUBLINGUAL | Status: DC | PRN

## 2014-02-20 MED ORDER — PANTOPRAZOLE SODIUM 40 MG IV SOLR
40.0000 mg | Freq: Once | INTRAVENOUS | Status: AC
  Administered 2014-02-20: 40 mg via INTRAVENOUS
  Filled 2014-02-20: qty 40

## 2014-02-20 MED ORDER — DEXTROSE 5 % IV SOLN
1.0000 g | INTRAVENOUS | Status: DC
  Administered 2014-02-20 – 2014-02-24 (×5): 1 g via INTRAVENOUS
  Filled 2014-02-20 (×6): qty 1

## 2014-02-20 MED ORDER — SODIUM CHLORIDE 0.9 % IV SOLN
INTRAVENOUS | Status: DC
  Administered 2014-02-20 – 2014-02-23 (×4): via INTRAVENOUS

## 2014-02-20 MED ORDER — SODIUM CHLORIDE 0.9 % IV SOLN
80.0000 mg | Freq: Once | INTRAVENOUS | Status: DC
  Filled 2014-02-20: qty 80

## 2014-02-20 MED ORDER — ONDANSETRON HCL 4 MG/2ML IJ SOLN
4.0000 mg | Freq: Four times a day (QID) | INTRAMUSCULAR | Status: DC | PRN
  Administered 2014-02-23 – 2014-02-25 (×3): 4 mg via INTRAVENOUS
  Filled 2014-02-20 (×4): qty 2

## 2014-02-20 MED ORDER — SODIUM CHLORIDE 0.9 % IV SOLN: Freq: Once | INTRAVENOUS | Status: DC

## 2014-02-20 MED ORDER — DONEPEZIL HCL 5 MG PO TABS
10.0000 mg | ORAL_TABLET | Freq: Every day | ORAL | Status: DC
  Administered 2014-02-20 – 2014-02-23 (×4): 10 mg via ORAL
  Filled 2014-02-20 (×4): qty 2

## 2014-02-20 MED ORDER — MELATONIN 3 MG PO TABS: 3.0000 mg | ORAL_TABLET | Freq: Every day | ORAL | Status: DC

## 2014-02-20 MED ORDER — OXCARBAZEPINE 300 MG PO TABS
300.0000 mg | ORAL_TABLET | Freq: Two times a day (BID) | ORAL | Status: DC
  Administered 2014-02-20 – 2014-02-25 (×10): 300 mg via ORAL
  Filled 2014-02-20 (×12): qty 1

## 2014-02-20 MED ORDER — SODIUM CHLORIDE 0.9 % IV SOLN
8.0000 mg/h | INTRAVENOUS | Status: DC
  Administered 2014-02-20 – 2014-02-22 (×4): 8 mg/h via INTRAVENOUS
  Filled 2014-02-20 (×8): qty 80

## 2014-02-20 MED ORDER — ALLOPURINOL 100 MG PO TABS: 100.0000 mg | ORAL_TABLET | Freq: Every morning | ORAL | Status: DC

## 2014-02-20 NOTE — ED Notes (Signed)
Pt presents with multipe complains pt has history of low HGB and has been on iron supplements, pt reports nausea, vomiting, right abd pain, generlaized weakness, and constipation

## 2014-02-20 NOTE — H&P (Signed)
[facility] History and Physical  [name] ERX:[id number] DOB: [birth year] DOA: 02/20/2014  Referring physician: EDP PCP: Asencion Noble, MD   Chief Complaint: generalized weakness for 2 to3 weeks.   HPI: [name] is a 81 y.o. male with multiple medical problems, recently discharged from SNF to home comes to ED for generalized weakness for a few weeks associated with nausea, and one episode of vomiting yesterday. On arrival to ED, he was found to have questionable aspiration pneumonia on the CXR.  His lab work revealed elevated troponins, anemia and thrombocytopenia and he was found to be in acute renal failure. He had extensive work up int he past with an upper EGD for the evaluation of anemia including a hematology follow up . He continues to have black stools but no blood. He reports having a colonoscopy 3 years ago by Dr Laural Golden. He denies fevers or chills . He denies any chest pain or dizziness or palpitations. He has occasional productive cough and exertional sob. He was referred to medical service for admission for the management of the aspiration pneumonia   Review of Systems:  Constitutional:  No weight loss, night sweats, Fevers, chills, fatigue.  HEENT:  No headaches, Difficulty swallowing,Tooth/dental problems,Sore throat,  No sneezing, itching, ear ache, nasal congestion, post nasal drip,  Cardio-vascular:  No chest pain, Orthopnea, PND, swelling in lower extremities, anasarca, dizziness, palpitations  GI:  Mild abdominal pain and nausea, and one episode of vomtiing.   Resp:  Sob on exertion and cough   Skin:  no rash or lesions.  GU:  none Musculoskeletal:  No joint pain or swelling. No decreased range of motion. No back pain.  Psych:  No change in mood or affect. No depression or anxiety. No memory loss.   Past Medical History  Diagnosis Date  . Gout   . Stroke 1993, 1995    chronic balance issues  . Hyperlipidemia   . Emphysema   . Hypertension   .  CAD (coronary artery disease)     stent 2006  . Seizure disorder   . GERD (gastroesophageal reflux disease)   . Cancer   . Asthma   . Syncope Jan. 1, 2014  . AAA (abdominal aortic aneurysm)   . Sinus node dysfunction     s/p PPM implant April 2015 (ST Jude)  . Pneumonia   . Myocardial infarction   . CHF (congestive heart failure)   . Seizures   . Renal disorder   . CKD (chronic kidney disease) stage 4, GFR 15-29 ml/min 04/17/2013   Past Surgical History  Procedure Laterality Date  . Cardiac catheterization  08/23/2004    bare metal stenting of the proximal circumflex  . Colonoscopy      X 2  . Colonoscopy  11/01/2011    Procedure: COLONOSCOPY;  Surgeon: Rogene Houston, MD;  [facility]: [facility];  Service: Endoscopy;  Laterality: N/A;  100  . Eye surgery    . Nose surgery    . Pacemaker insertion  04-29-13    STJ Assurity dual chamber pacemaker implanted by Dr Rayann Heman for syncope and sinus node dysfunction  . Esophagogastroduodenoscopy N/A 12/16/2013    Procedure: ESOPHAGOGASTRODUODENOSCOPY (EGD);  Surgeon: Rogene Houston, MD;  Location: [facility];  Service: Endoscopy;  Laterality: N/A;  . Permanent pacemaker insertion N/A 04/29/2013    Procedure: PERMANENT PACEMAKER INSERTION;  Surgeon: Coralyn Mark, MD;  Location: [place] [facility];  Service: Cardiovascular;  Laterality: N/A;  . Tongue biopsy  Right 12/2013   Social History:  reports that he quit smoking about 24 years ago. His smoking use included Cigarettes. He has a 15 pack-year smoking history. He has never used smokeless tobacco. He reports that he does not drink alcohol or use illicit drugs.  Allergies  Allergen Reactions  . Penicillins Itching    Family History  Problem Relation Age of Onset  . Emphysema Brother   . Heart disease Sister   . Cancer Brother     lung  . Diabetes Brother   . Cancer Brother     throat  . Diabetes Brother   . Rheum arthritis Mother   . Arthritis Mother   . Rheum arthritis Father     . Colon cancer Neg Hx   . Diabetes    . Stroke Mother   . Heart attack    . Heart attack Sister     Prior to Admission medications   Medication Sig Start Date End Date Taking? Authorizing Provider  allopurinol (ZYLOPRIM) 100 MG tablet Take 1 tablet (100 mg total) by mouth every morning. 10/28/13  Yes Asencion Noble, MD  donepezil (ARICEPT) 10 MG tablet Take 10 mg by mouth at bedtime.    Yes Historical Provider, MD  famotidine (PEPCID) 20 MG tablet TAKE ONE TABLET BY MOUTH TWICE DAILY. 12/22/13  Yes Elsie Stain, MD  ferrous gluconate (FERGON) 324 MG tablet Take 1 tablet (324 mg total) by mouth daily with breakfast. 12/17/13  Yes Asencion Noble, MD  isosorbide mononitrate (IMDUR) 30 MG 24 hr tablet Take 0.5 tablets (15 mg total) by mouth daily. 08/26/13  Yes Scott Joylene Draft, PA-C  Melatonin ([employer] MELATONIN) 3 MG TABS Take 3 mg by mouth at bedtime.   Yes Historical Provider, MD  metoprolol tartrate (LOPRESSOR) 25 MG tablet Take 0.5 tablets (12.5 mg total) by mouth 2 (two) times daily. Patient taking differently: Take 12.5 mg by mouth daily.  09/24/13  Yes Arnoldo Lenis, MD  nitroGLYCERIN (NITROSTAT) 0.4 MG SL tablet Place 1 tablet (0.4 mg total) under the tongue every 5 (five) minutes as needed for chest pain. 1 tablet under tongue at onset of chest pain;you may repeat every 5 minutes for up to 3 doses. 10/03/12  Yes Herminio Commons, MD  Oxcarbazepine (TRILEPTAL) 300 MG tablet Take 300 mg by mouth 2 (two) times daily.    Yes Historical Provider, MD  polyethylene glycol powder (GLYCOLAX/MIRALAX) powder Take 17 g by mouth daily. *Mixed in 4-8 ounces of liquid and drink daily   Yes Historical Provider, MD  pravastatin (PRAVACHOL) 40 MG tablet Take 40 mg by mouth every evening.    Yes Historical Provider, MD  Tamsulosin HCl (FLOMAX) 0.4 MG CAPS Take 0.4 mg by mouth at bedtime.    Yes Historical Provider, MD  torsemide (DEMADEX) 20 MG tablet Take 1 tablet (20 mg total) by mouth daily. 10/28/13  Yes Asencion Noble, MD  VITAMIN B1-B12 IM Inject into the muscle every 30 (thirty) days. Once a month   Yes Historical Provider, MD  Linaclotide (LINZESS) 145 MCG CAPS capsule Take 1 capsule (145 mcg total) by mouth daily. Patient not taking: Reported on 01/19/2014 08/03/13   Butch Penny, NP  tiotropium (SPIRIVA HANDIHALER) 18 MCG inhalation capsule Place 1 capsule (18 mcg total) into inhaler and inhale daily. Patient not taking: Reported on 01/19/2014 12/23/13   Elsie Stain, MD   Physical Exam: Filed Vitals:   02/20/14 1130 02/20/14 1200 02/20/14 1425 02/20/14 1438  BP: 112/63 116/56 111/59 111/60  Pulse:  79 82 87  Temp:    97.7 F (36.5 C)  TempSrc:    Oral  Resp: 14 12 10 14   Weight:      SpO2:  100% 97% 99%    Wt Readings from Last 3 Encounters:  02/20/14 76.204 kg (168 lb)  12/29/13 76.34 kg (168 lb 4.8 oz)  12/13/13 77.111 kg (170 lb)    General:  Appears calm and comfortable Eyes: PERRL, normal lids, irises & conjunctiva Neck: no LAD, masses or thyromegaly Cardiovascular: RRR, Trace edema Respiratory: diminished air entry at bases, Normal respiratory effort. Abdomen: soft, ntnd Skin: no rash or induration seen on limited exam Musculoskeletal: grossly normal tone BUE/BLE Psychiatric: grossly normal mood and affect, speech fluent and appropriate Neurologic: grossly non-focal.          Labs on Admission:  Basic Metabolic Panel:  Recent Labs Lab 02/20/14 1016  NA 141  K 3.9  CL 106  CO2 30  GLUCOSE 103*  BUN 48*  CREATININE 4.93*  CALCIUM 8.6   Liver Function Tests:  Recent Labs Lab 02/20/14 1016  AST 18  ALT 13  ALKPHOS 101  BILITOT 0.7  PROT 5.9*  ALBUMIN 3.1*    Recent Labs Lab 02/20/14 1016  LIPASE 24   No results for input(s): AMMONIA in the last 168 hours. CBC:  Recent Labs Lab 02/20/14 1016  WBC 6.4  NEUTROABS 3.4  HGB 8.4*  HCT 26.0*  MCV 99.6  PLT 75*   Cardiac Enzymes:  Recent Labs Lab 02/20/14 1016  TROPONINI 0.24*     BNP (last 3 results) No results for input(s): BNP in the last 8760 hours.  ProBNP (last 3 results)  Recent Labs  04/17/13 2008  PROBNP 3970.0*    CBG: No results for input(s): GLUCAP in the last 168 hours.  Radiological Exams on Admission: Dg Chest 1 View  02/20/2014   CLINICAL DATA:  Cough and chills.  EXAM: CHEST  1 VIEW  COMPARISON:  12/13/2013  FINDINGS: Left-sided pacemaker unchanged. Lungs somewhat hypoinflated with hazy bibasilar opacification right worse than left likely small effusions and associated atelectasis although cannot exclude infection in the lung bases. Stable cardiomegaly. Remainder of the exam is unchanged.  IMPRESSION: New hazy bibasilar right worse than left likely pleural effusions with associated atelectasis, although cannot exclude infection in the lung bases.  Stable cardiomegaly.   Electronically Signed   By: Marin Olp M.D.   On: 02/20/2014 11:35   Ct Soft Tissue Neck Wo Contrast  02/20/2014   CLINICAL DATA:  Cough and dysphagia.  End-stage renal disease.  EXAM: CT NECK WITHOUT CONTRAST  TECHNIQUE: Multidetector CT imaging of the neck was performed following the standard protocol without intravenous contrast.  COMPARISON:  None.  FINDINGS: The visualized orbits are normal and symmetric. Visualized paranasal sinuses and mastoid air cells are clear. The visualized intracranial structures are unremarkable.  Spaces of the suprahyoid neck are within normal without evidence of focal mass, adenopathy, fluid collection or focal inflammatory change. Prevertebral soft tissues are within normal. Or pharyngeal and nasopharyngeal airways as well as the epiglottis and subglottic airway are normal. Moderate calcified plaque at the carotid bifurcations right worse than left. Salivary glands normal and symmetric.  The infrahyoid neck is unremarkable.  Images through the upper chest demonstrate moderate bilateral pleural effusions right greater than left. Left-sided pacemaker is  present. There is moderate calcified plaque over the aortic arch.  There moderate degenerate changes  of the spine with multilevel disc disease over the cervical spine.  IMPRESSION: No focal mass, inflammatory process or adenopathy.  Moderate size bilateral pleural effusions right greater than left.  Moderate atherosclerotic disease at the carotid bifurcations bilaterally right worse than left.  Moderate spondylosis of the spine with multilevel disc disease over the cervical spine.   Electronically Signed   By: Marin Olp M.D.   On: 02/20/2014 11:32    EKG: sinus with prolonged PR interval, reviewed.   Assessment/Plan Active Problems:   Anemia   Acute renal failure  Acute renal failure: Possibly from decreased po intake and dehydration from nausea and vomiting.  Gentle hydration and repeat renal parameters in am. UA and US renal ordered .    Anemia: chronic unclear etiology. Normocytic. Ordered 1 unit of prbc transfusion and get stool for occult blood.  Get gastroenterology consult in am.    H/o seizures: no seizure activity recently. Resume trileptal.    Questionable aspiration pneumonia: Started pt on cefepime and get speech eval.   Nausea, vomiting and abdominal pain ; Resolved . None today.  Hypertension: controlled.    Elevated troponins: He denies chest pain, demand ischemia from infection/ anemia/ renal failure.  Spoke in detail with the patient and family, they had this discussion with their cardiologist and did not want any interventions at this time.  Aspirin and plavix were stopped in view of his gi bleed. Resume metoprolol for now.       Code Status: DNR DVT Prophylaxis: Family Communication: FAMILY ATBEDSIDE Disposition Plan: pending  Time spent: 13 min  [facility] Pager [phone]

## 2014-02-20 NOTE — ED Provider Notes (Addendum)
CSN: [id number]     Arrival date & time 02/20/14  0905 History  This chart was scribed for Tanna Furry, MD by Evelene Croon, ED Scribe. This patient was seen in room [facility]18[facility] and the patient's care was started 9:23 AM.   Chief Complaint  Patient presents with  . Abdominal Pain  . Weakness    x 3 week, decreased intake      The history is provided by the patient and a relative (Daughter). No language interpreter was used.     HPI Comments:  [name] is a 81 y.o. male with a h/o MI, CAD, GERD and anemia who presents to the Emergency Department complaining of worsening nausea that started several weeks ago. Pt state he vomited yesterday morning; denies blood in his vomit. He also denies abdominal pain at this time.  Pt's daughter reports right sided abdominal pain yesterday. He has been taking Zofran as needed with minimal relief.  Marland KitchenHe denies fever. Pt recently had endoscopy secondary to blood in stool/sputum; no source found.  Hd endoscopy by ENT in [place] for possible Upper airway source/hemoptysis , but also no source found.  so pt was placed on abx for his cough which he recently finished. He reports recent black stools  but notes he is currently take iron supplements due to recent anemia. Pt is also currently taking pepcid.   Denies chest pain. Has had a cough. Baseline shortness of breath. Is on home O2 at 1-2 L.  History of heart disease status post pacemaker placement. Ask cath in 2006. Follow-up with his cardiologist who recommended medical treatment for his coronary artery disease.   Past Medical History  Diagnosis Date  . Gout   . Stroke 1993, 1995    chronic balance issues  . Hyperlipidemia   . Emphysema   . Hypertension   . CAD (coronary artery disease)     stent 2006  . Seizure disorder   . GERD (gastroesophageal reflux disease)   . Cancer   . Asthma   . Syncope Jan. 1, 2014  . AAA (abdominal aortic aneurysm)   . Sinus node dysfunction     s/p PPM implant April  2015 (ST Jude)  . Pneumonia   . Myocardial infarction   . CHF (congestive heart failure)   . Seizures   . Renal disorder   . CKD (chronic kidney disease) stage 4, GFR 15-29 ml/min 04/17/2013   Past Surgical History  Procedure Laterality Date  . Cardiac catheterization  08/23/2004    bare metal stenting of the proximal circumflex  . Colonoscopy      X 2  . Colonoscopy  11/01/2011    Procedure: COLONOSCOPY;  Surgeon: Rogene Houston, MD;  [facility]: [facility];  Service: Endoscopy;  Laterality: N/A;  100  . Eye surgery    . Nose surgery    . Pacemaker insertion  04-29-13    STJ Assurity dual chamber pacemaker implanted by Dr Rayann Heman for syncope and sinus node dysfunction  . Esophagogastroduodenoscopy N/A 12/16/2013    Procedure: ESOPHAGOGASTRODUODENOSCOPY (EGD);  Surgeon: Rogene Houston, MD;  Location: [facility];  Service: Endoscopy;  Laterality: N/A;  . Permanent pacemaker insertion N/A 04/29/2013    Procedure: PERMANENT PACEMAKER INSERTION;  Surgeon: Coralyn Kasondra Junod, MD;  Location: [place] [facility];  Service: Cardiovascular;  Laterality: N/A;  . Tongue biopsy Right 12/2013   Family History  Problem Relation Age of Onset  . Emphysema Brother   . Heart disease Sister   .  Cancer Brother     lung  . Diabetes Brother   . Cancer Brother     throat  . Diabetes Brother   . Rheum arthritis Mother   . Arthritis Mother   . Rheum arthritis Father   . Colon cancer Neg Hx   . Diabetes    . Stroke Mother   . Heart attack    . Heart attack Sister    History  Substance Use Topics  . Smoking status: Former Smoker -- 0.30 packs/day for 50 years    Types: Cigarettes    Quit date: 01/15/1990  . Smokeless tobacco: Never Used  . Alcohol Use: No    Review of Systems  Constitutional: Negative for fever, chills, diaphoresis, appetite change and fatigue.  HENT: Negative for mouth sores, sore throat and trouble swallowing.   Eyes: Negative for visual disturbance.  Respiratory: Negative for  cough, chest tightness, shortness of breath and wheezing.   Cardiovascular: Negative for chest pain.  Gastrointestinal: Positive for nausea, vomiting and abdominal pain. Negative for diarrhea and abdominal distention.  Endocrine: Negative for polydipsia, polyphagia and polyuria.  Genitourinary: Negative for dysuria, frequency and hematuria.  Musculoskeletal: Negative for gait problem.  Skin: Negative for color change, pallor and rash.  Neurological: Negative for dizziness, syncope, light-headedness and headaches.  Hematological: Does not bruise/bleed easily.  Psychiatric/Behavioral: Negative for behavioral problems and confusion.  All other systems reviewed and are negative.     Allergies  Penicillins  Home Medications   Prior to Admission medications   Medication Sig Start Date End Date Taking? Authorizing Provider  allopurinol (ZYLOPRIM) 100 MG tablet Take 1 tablet (100 mg total) by mouth every morning. 10/28/13   Asencion Noble, MD  donepezil (ARICEPT) 10 MG tablet Take 10 mg by mouth at bedtime.     Historical Provider, MD  famotidine (PEPCID) 20 MG tablet TAKE ONE TABLET BY MOUTH TWICE DAILY. 12/22/13   Elsie Stain, MD  ferrous gluconate (FERGON) 324 MG tablet Take 1 tablet (324 mg total) by mouth daily with breakfast. 12/17/13   Asencion Noble, MD  isosorbide mononitrate (IMDUR) 30 MG 24 hr tablet Take 0.5 tablets (15 mg total) by mouth daily. 08/26/13   Scott Joylene Draft, PA-C  Linaclotide (LINZESS) 145 MCG CAPS capsule Take 1 capsule (145 mcg total) by mouth daily. Patient not taking: Reported on 01/19/2014 08/03/13   Butch Penny, NP  Melatonin ([employer] MELATONIN) 3 MG TABS Take 3 mg by mouth at bedtime.    Historical Provider, MD  metoprolol tartrate (LOPRESSOR) 25 MG tablet Take 0.5 tablets (12.5 mg total) by mouth 2 (two) times daily. Patient taking differently: Take 12.5 mg by mouth daily.  09/24/13   Arnoldo Lenis, MD  nitroGLYCERIN (NITROSTAT) 0.4 MG SL tablet Place 1 tablet (0.4 mg  total) under the tongue every 5 (five) minutes as needed for chest pain. 1 tablet under tongue at onset of chest pain;you may repeat every 5 minutes for up to 3 doses. 10/03/12   Herminio Commons, MD  Oxcarbazepine (TRILEPTAL) 300 MG tablet Take 300 mg by mouth 2 (two) times daily.     Historical Provider, MD  oxybutynin (DITROPAN) 5 MG tablet Take 5 mg by mouth 2 (two) times daily.     Historical Provider, MD  polyethylene glycol powder (GLYCOLAX/MIRALAX) powder Take 17 g by mouth daily. *Mixed in 4-8 ounces of liquid and drink daily    Historical Provider, MD  pravastatin (PRAVACHOL) 40 MG tablet Take 40 mg  by mouth every evening.     Historical Provider, MD  Tamsulosin HCl (FLOMAX) 0.4 MG CAPS Take 0.4 mg by mouth at bedtime.     Historical Provider, MD  tiotropium (SPIRIVA HANDIHALER) 18 MCG inhalation capsule Place 1 capsule (18 mcg total) into inhaler and inhale daily. Patient not taking: Reported on 01/19/2014 12/23/13   Elsie Stain, MD  torsemide (DEMADEX) 20 MG tablet Take 1 tablet (20 mg total) by mouth daily. 10/28/13   Asencion Noble, MD  VITAMIN B1-B12 IM Inject into the muscle every 30 (thirty) days. Once a month    Historical Provider, MD   BP 121/69 mmHg  Pulse 86  Temp(Src) 97.7 F (36.5 C) (Oral)  Resp 18  Wt 168 lb (76.204 kg)  SpO2 95% Physical Exam  Constitutional: He is oriented to person, place, and time. He appears well-developed and well-nourished. No distress.  HENT:  Head: Normocephalic.  Eyes: Pupils are equal, round, and reactive to light. No scleral icterus.  Slightly pale conjunctiva   Neck: Normal range of motion. Neck supple. No thyromegaly present.  Cardiovascular: Normal rate and regular rhythm.  Exam reveals no gallop and no friction rub.   No murmur heard. Pacemaker left chest  Pulmonary/Chest: Effort normal and breath sounds normal. No respiratory distress. He has no wheezes. He has no rales.  Abdominal: Soft. Bowel sounds are normal. He exhibits no  distension. There is no tenderness. There is no rebound.  Musculoskeletal: Normal range of motion. He exhibits no edema.  No peripheral edema  Neurological: He is alert and oriented to person, place, and time.  Skin: Skin is warm and dry. No rash noted.  Skin on his chest appears normal NAil beds pale   Psychiatric: He has a normal mood and affect. His behavior is normal.  Nursing note and vitals reviewed.   ED Course  Procedures   DIAGNOSTIC STUDIES:  Oxygen Saturation is 95% on 3L [place], adequate by my interpretation.    COORDINATION OF CARE:  9:34 AM Will order CXR, nausea meds and blood work. Discussed treatment plan with pt and family at bedside and pt agreed to plan.  Labs Review Labs Reviewed - No data to display  Imaging Review No results found.   EKG Interpretation None      MDM   Final diagnoses:  None    Problem list includes: 1)  elevated troponin/non-Q-wave MI. Patient reports no chest pain and has unchanged EKG. Likely demand ischemia due to hypoxemia from pneumonia, and anemia. 2)  symptomatic anemia with chronic occult upper GI bleed, remains anemic despite transfusion several days ago. Is getting chronic erythropoietin injections. 3)  quite positive stool/occult GI bleed. 4)  abnormal chest x-ray with effusions and probable right basilar infiltrate. Probable pneumonia. Patient's report of "food hanging up in my throat", frequent vomiting concerns for aspiration pneumonia.  I will discuss the case with hospitalist for ongoing care  D/W Dr. Karleen Hampshire, pt to be admitted.  D/W Pharmacy.  Patient's last admission over 60 days ago. Concern for aspiration pneumonitis. Pharmacy recommends Primaxin IV to 50 with consideration of patient's penicillin allergies and poor creatinine clearance.  14:30:  After further discussion with pharmacy patient has a history of seizures as a contraindication of Primaxin. They recommended cefepime, and Flagyl.   I personally  performed the services described in this documentation, which was scribed in my presence. The recorded information has been reviewed and is accurate.    Tanna Furry, MD 02/20/14 1348  Elta [place]  Jeneen Rinks, MD 02/20/14 1427

## 2014-02-20 NOTE — ED Notes (Signed)
Went to round of pt and noticed that he has blood/ fluid leaking for left elbow, elbow is swollen this does not appear to be IV related, pt family states they just noticed it.. Dressing applied to area.

## 2014-02-20 NOTE — Progress Notes (Signed)
Primaxin per pharmacy ordered for HCAP vs aspiration PNA in elderly patient with PCN allergy.  Upon chart review patient is noted to have history of seizures and is currently on anti-seizure medications.  Called to discuss with Dr Jeneen Rinks.  Patient will be placed on antibiotic regimen of Cefepime (dose adjusted to 1gm IV q24h for renal function) + Flagyl for anaerobic coverage.   Orders placed by Dr Jeneen Rinks.   Netta Cedars, PharmD, BCPS 02/20/2014@2 :43 PM

## 2014-02-21 ENCOUNTER — Inpatient Hospital Stay (HOSPITAL_COMMUNITY): Payer: Medicare Other

## 2014-02-21 LAB — CBC
HEMATOCRIT: 27.3 % — AB (ref 39.0–52.0)
Hemoglobin: 8.8 g/dL — ABNORMAL LOW (ref 13.0–17.0)
MCH: 31.4 pg (ref 26.0–34.0)
MCHC: 32.2 g/dL (ref 30.0–36.0)
MCV: 97.5 fL (ref 78.0–100.0)
PLATELETS: 69 10*3/uL — AB (ref 150–400)
RBC: 2.8 MIL/uL — ABNORMAL LOW (ref 4.22–5.81)
RDW: 18.1 % — ABNORMAL HIGH (ref 11.5–15.5)
WBC: 7.1 10*3/uL (ref 4.0–10.5)

## 2014-02-21 LAB — BASIC METABOLIC PANEL
Anion gap: 8 (ref 5–15)
BUN: 49 mg/dL — AB (ref 6–23)
CHLORIDE: 108 mmol/L (ref 96–112)
CO2: 26 mmol/L (ref 19–32)
Calcium: 8.5 mg/dL (ref 8.4–10.5)
Creatinine, Ser: 4.67 mg/dL — ABNORMAL HIGH (ref 0.50–1.35)
GFR calc Af Amer: 12 mL/min — ABNORMAL LOW (ref >90)
GFR, EST NON AFRICAN AMERICAN: 10 mL/min — AB (ref >90)
Glucose, Bld: 97 mg/dL (ref 70–99)
POTASSIUM: 3.9 mmol/L (ref 3.5–5.1)
SODIUM: 142 mmol/L (ref 135–145)

## 2014-02-21 LAB — HEMOGLOBIN AND HEMATOCRIT, BLOOD
HCT: 29.8 % — ABNORMAL LOW (ref 39.0–52.0)
HEMATOCRIT: 28.5 % — AB (ref 39.0–52.0)
Hemoglobin: 9.5 g/dL — ABNORMAL LOW (ref 13.0–17.0)
Hemoglobin: 9.2 g/dL — ABNORMAL LOW (ref 13.0–17.0)

## 2014-02-21 LAB — TYPE AND SCREEN
ABO/RH(D): A POS
Antibody Screen: NEGATIVE
Unit division: 0

## 2014-02-21 LAB — RETICULOCYTES
RBC.: 3.05 MIL/uL — ABNORMAL LOW (ref 4.22–5.81)
Retic Count, Absolute: 33.6 10*3/uL (ref 19.0–186.0)
Retic Ct Pct: 1.1 % (ref 0.4–3.1)

## 2014-02-21 LAB — TROPONIN I: Troponin I: 0.26 ng/mL — ABNORMAL HIGH (ref <0.031)

## 2014-02-21 NOTE — Progress Notes (Signed)
Name-year-old gentleman at home admitted with multiple medical issues including suspected aspiration pneumonia treated with Maxipime worsening anemia previously worked up status post 1 unit PRBC transfusion hemoglobin 8.8 currently black stools no Hemoccult at present acute on chronic renal failure creatinine currently 4.67 which previously 5.29 on 10/15. Normal echo on 4/ 15. Elevated troponins as is common currently with our lab without clinical stigmata of ischemia [name] MHD:[id number] DOB: [birth year] DOA: 02/20/2014 PCP: Asencion Noble, MD             Physical Exam: Blood pressure 104/53, pulse 80, temperature 98.1 F (36.7 C), temperature source Oral, resp. rate 18, weight 168 lb (76.204 kg), SpO2 100 %.neck no JVD noted. No thyromegaly lungs show prolonged drainage. Facial expression sebaceous no rales wheezes or rhonchi appreciable heart regular rhythm strips for that he rubs resolved on the bus has normoactive   Investigations:  Recent Results (from the past 240 hour(s))  Culture, blood (routine x 2)     Status: None (Preliminary result)   Collection Time: 02/20/14  1:31 PM  Result Value Ref Range Status   Specimen Description BLOOD LEFT ARM DRAWN BY RN  Final   Special Requests BOTTLES DRAWN AEROBIC AND ANAEROBIC 6CC BOTTLES  Final   Culture NO GROWTH 1 DAY  Final   Report Status PENDING  Incomplete  Culture, blood (routine x 2)     Status: None (Preliminary result)   Collection Time: 02/20/14  1:42 PM  Result Value Ref Range Status   Specimen Description BLOOD LEFT ARM  Final   Special Requests   Final    BOTTLES DRAWN AEROBIC AND ANAEROBIC 8CC EACH BOTTLE   Culture NO GROWTH 1 DAY  Final   Report Status PENDING  Incomplete     Basic Metabolic Panel:  Recent Labs  02/20/14 1016 02/21/14 0433  NA 141 142  K 3.9 3.9  CL 106 108  CO2 30 26  GLUCOSE 103* 97  BUN 48* 49*  CREATININE 4.93* 4.67*  CALCIUM 8.6 8.5   Liver Function Tests:  Recent Labs   02/20/14 1016  AST 18  ALT 13  ALKPHOS 101  BILITOT 0.7  PROT 5.9*  ALBUMIN 3.1*     CBC:  Recent Labs  02/20/14 1016 02/21/14 0433  WBC 6.4 7.1  NEUTROABS 3.4  --   HGB 8.4* 8.8*  HCT 26.0* 27.3*  MCV 99.6 97.5  PLT 75* 69*    Dg Chest 1 View  02/20/2014   CLINICAL DATA:  Cough and chills.  EXAM: CHEST  1 VIEW  COMPARISON:  12/13/2013  FINDINGS: Left-sided pacemaker unchanged. Lungs somewhat hypoinflated with hazy bibasilar opacification right worse than left likely small effusions and associated atelectasis although cannot exclude infection in the lung bases. Stable cardiomegaly. Remainder of the exam is unchanged.  IMPRESSION: New hazy bibasilar right worse than left likely pleural effusions with associated atelectasis, although cannot exclude infection in the lung bases.  Stable cardiomegaly.   Electronically Signed   By: Marin Olp M.D.   On: 02/20/2014 11:35   Ct Soft Tissue Neck Wo Contrast  02/20/2014   CLINICAL DATA:  Cough and dysphagia.  End-stage renal disease.  EXAM: CT NECK WITHOUT CONTRAST  TECHNIQUE: Multidetector CT imaging of the neck was performed following the standard protocol without intravenous contrast.  COMPARISON:  None.  FINDINGS: The visualized orbits are normal and symmetric. Visualized paranasal sinuses and mastoid air cells are clear. The visualized intracranial structures are unremarkable.  Spaces of the  suprahyoid neck are within normal without evidence of focal mass, adenopathy, fluid collection or focal inflammatory change. Prevertebral soft tissues are within normal. Or pharyngeal and nasopharyngeal airways as well as the epiglottis and subglottic airway are normal. Moderate calcified plaque at the carotid bifurcations right worse than left. Salivary glands normal and symmetric.  The infrahyoid neck is unremarkable.  Images through the upper chest demonstrate moderate bilateral pleural effusions right greater than left. Left-sided pacemaker is  present. There is moderate calcified plaque over the aortic arch.  There moderate degenerate changes of the spine with multilevel disc disease over the cervical spine.  IMPRESSION: No focal mass, inflammatory process or adenopathy.  Moderate size bilateral pleural effusions right greater than left.  Moderate atherosclerotic disease at the carotid bifurcations bilaterally right worse than left.  Moderate spondylosis of the spine with multilevel disc disease over the cervical spine.   Electronically Signed   By: Marin Olp M.D.   On: 02/20/2014 11:32   US Renal  02/21/2014   CLINICAL DATA:  Acute renal failure  EXAM: RENAL/URINARY TRACT ULTRASOUND COMPLETE  COMPARISON:  None.  FINDINGS: Right Kidney:  Length: 9.5 cm.  No mass or hydronephrosis.  Left Kidney:  Length: 10.0 cm.  Poorly visualized.  No mass or hydronephrosis.  Bladder:  Underdistended.  IMPRESSION: No hydronephrosis.   Electronically Signed   By: Julian Hy M.D.   On: 02/21/2014 11:27      Medication  Impression: Possible infiltrate on chest x-ray possible aspiration Black stools awaiting Hemoccult testing Elevated troponins Active Problems:   Anemia   Acute renal failure     Plan:continue Maxipime for possible infiltrate. Serial hemoglobin and hematocrit every 8 hours. Hemoccult testing of stools. Gentle fluid resuscitation for renal insufficiency possible intravascular volume  Depletion.monitor be met daily gastroenterology and nephrology consults ordered. Anemia panel ordered  Consultants: Gastroenterology and nephrology ordered   Procedures   Antibiotics: Maxipime IV Flagyl also ordered empirically admitting physician                  Code Status:full   Family Communication:    Disposition Plan serial hemoglobin and hematocrit fluid resuscitation for possible intravascular depletion and acute on chronic renal insufficiency GI and nephrology consults requested  Time spent:45 minutes   LOS: 1 day    Keenan Trefry M   02/21/2014, 1:00 PM

## 2014-02-22 ENCOUNTER — Other Ambulatory Visit (HOSPITAL_COMMUNITY): Payer: Self-pay | Admitting: Oncology

## 2014-02-22 ENCOUNTER — Other Ambulatory Visit: Payer: Self-pay | Admitting: Cardiology

## 2014-02-22 DIAGNOSIS — D649 Anemia, unspecified: Secondary | ICD-10-CM

## 2014-02-22 DIAGNOSIS — K922 Gastrointestinal hemorrhage, unspecified: Secondary | ICD-10-CM

## 2014-02-22 DIAGNOSIS — D696 Thrombocytopenia, unspecified: Secondary | ICD-10-CM

## 2014-02-22 DIAGNOSIS — K219 Gastro-esophageal reflux disease without esophagitis: Secondary | ICD-10-CM

## 2014-02-22 DIAGNOSIS — J90 Pleural effusion, not elsewhere classified: Secondary | ICD-10-CM

## 2014-02-22 DIAGNOSIS — N185 Chronic kidney disease, stage 5: Secondary | ICD-10-CM

## 2014-02-22 DIAGNOSIS — K5900 Constipation, unspecified: Secondary | ICD-10-CM

## 2014-02-22 LAB — BASIC METABOLIC PANEL
ANION GAP: 9 (ref 5–15)
BUN: 46 mg/dL — AB (ref 6–23)
CO2: 24 mmol/L (ref 19–32)
Calcium: 8.5 mg/dL (ref 8.4–10.5)
Chloride: 108 mmol/L (ref 96–112)
Creatinine, Ser: 4.48 mg/dL — ABNORMAL HIGH (ref 0.50–1.35)
GFR calc non Af Amer: 10 mL/min — ABNORMAL LOW (ref >90)
GFR, EST AFRICAN AMERICAN: 12 mL/min — AB (ref >90)
Glucose, Bld: 84 mg/dL (ref 70–99)
Potassium: 4 mmol/L (ref 3.5–5.1)
Sodium: 141 mmol/L (ref 135–145)

## 2014-02-22 LAB — FERRITIN: Ferritin: 92 ng/mL (ref 22–322)

## 2014-02-22 LAB — IRON AND TIBC
Iron: 152 ug/dL — ABNORMAL HIGH (ref 42–165)
Saturation Ratios: 63 % — ABNORMAL HIGH (ref 20–55)
TIBC: 242 ug/dL (ref 215–435)
UIBC: 90 ug/dL — AB (ref 125–400)

## 2014-02-22 LAB — FOLATE: Folate: 5.3 ng/mL

## 2014-02-22 LAB — VITAMIN B12: Vitamin B-12: 964 pg/mL — ABNORMAL HIGH (ref 211–911)

## 2014-02-22 MED ORDER — FUROSEMIDE 10 MG/ML IJ SOLN
80.0000 mg | Freq: Two times a day (BID) | INTRAMUSCULAR | Status: DC
  Administered 2014-02-22 – 2014-02-24 (×6): 80 mg via INTRAVENOUS
  Filled 2014-02-22 (×6): qty 8

## 2014-02-22 MED ORDER — PANTOPRAZOLE SODIUM 40 MG PO TBEC
40.0000 mg | DELAYED_RELEASE_TABLET | Freq: Two times a day (BID) | ORAL | Status: DC
  Administered 2014-02-22 – 2014-02-25 (×6): 40 mg via ORAL
  Filled 2014-02-22 (×6): qty 1

## 2014-02-22 MED ORDER — SODIUM CHLORIDE 0.9 % IV SOLN
250.0000 mg | Freq: Once | INTRAVENOUS | Status: DC
  Filled 2014-02-22: qty 20

## 2014-02-22 MED ORDER — SODIUM CHLORIDE 0.9 % IV SOLN
250.0000 mg | Freq: Once | INTRAVENOUS | Status: AC
  Administered 2014-02-24: 250 mg via INTRAVENOUS
  Filled 2014-02-22: qty 20

## 2014-02-22 MED ORDER — FUROSEMIDE 10 MG/ML IJ SOLN: 80.0000 mg | Freq: Two times a day (BID) | INTRAMUSCULAR | Status: DC

## 2014-02-22 MED ORDER — FOLIC ACID 1 MG PO TABS
1.0000 mg | ORAL_TABLET | Freq: Every day | ORAL | Status: DC
  Administered 2014-02-22 – 2014-02-25 (×4): 1 mg via ORAL
  Filled 2014-02-22 (×4): qty 1

## 2014-02-22 MED ORDER — DARBEPOETIN ALFA 60 MCG/0.3ML IJ SOSY
60.0000 ug | PREFILLED_SYRINGE | Freq: Once | INTRAMUSCULAR | Status: AC
  Administered 2014-02-23: 60 ug via SUBCUTANEOUS
  Filled 2014-02-22: qty 0.3

## 2014-02-22 MED ORDER — NA FERRIC GLUC CPLX IN SUCROSE 12.5 MG/ML IV SOLN
250.0000 mg | Freq: Once | INTRAVENOUS | Status: AC
  Administered 2014-02-22: 250 mg via INTRAVENOUS
  Filled 2014-02-22: qty 20

## 2014-02-22 NOTE — Progress Notes (Signed)
Subjective: [name] was admitted with weakness and cough. He denies any fever. He is coughing up white mucus. His chest x-ray reveals small bilateral effusions and atelectasis versus possible infiltrate. Objective: Vital signs in last 24 hours: Filed Vitals:   02/21/14 1329 02/21/14 2300 02/22/14 0247 02/22/14 0417  BP: 100/47 114/61  122/74  Pulse: 75 81  82  Temp: 97.5 F (36.4 C) 97.7 F (36.5 C)  98.8 F (37.1 C)  TempSrc: Oral Oral  Oral  Resp: 18 18  16   Height:   5\' 10"  (1.778 m)   Weight:   168 lb (76.204 kg)   SpO2: 98% 100%  96%   Weight change: 0 lb (0 kg)  Intake/Output Summary (Last 24 hours) at 02/22/14 0723 Last data filed at 02/21/14 2305  Gross per 24 hour  Intake    630 ml  Output    500 ml  Net    130 ml    Physical Exam: Alert. Breathing comfortably. No distress. Lungs clear. Heart regular with no murmurs. Abdomen is soft and nontender with no palpable organomegaly. Extremities reveal no edema.    Lab Results:    Results for orders placed or performed during the hospital encounter of 02/20/14 (from the past 24 hour(s))  Reticulocytes     Status: Abnormal   Collection Time: 02/21/14 12:58 PM  Result Value Ref Range   Retic Ct Pct 1.1 0.4 - 3.1 %   RBC. 3.05 (L) 4.22 - 5.81 MIL/uL   Retic Count, Manual 33.6 19.0 - 186.0 K/uL  Hemoglobin and hematocrit, blood     Status: Abnormal   Collection Time: 02/21/14 12:58 PM  Result Value Ref Range   Hemoglobin 9.5 (L) 13.0 - 17.0 g/dL   HCT 29.8 (L) 39.0 - 52.0 %  Hemoglobin and hematocrit, blood     Status: Abnormal   Collection Time: 02/21/14  9:11 PM  Result Value Ref Range   Hemoglobin 9.2 (L) 13.0 - 17.0 g/dL   HCT 28.5 (L) 39.0 - 52.0 %     ABGS No results for input(s): PHART, PO2ART, TCO2, HCO3 in the last 72 hours.  Invalid input(s): PCO2 CULTURES Recent Results (from the past 240 hour(s))  Culture, blood (routine x 2)     Status: None (Preliminary result)   Collection Time: 02/20/14   1:31 PM  Result Value Ref Range Status   Specimen Description BLOOD LEFT ARM DRAWN BY RN  Final   Special Requests BOTTLES DRAWN AEROBIC AND ANAEROBIC 6CC BOTTLES  Final   Culture NO GROWTH 1 DAY  Final   Report Status PENDING  Incomplete  Culture, blood (routine x 2)     Status: None (Preliminary result)   Collection Time: 02/20/14  1:42 PM  Result Value Ref Range Status   Specimen Description BLOOD LEFT ARM  Final   Special Requests   Final    BOTTLES DRAWN AEROBIC AND ANAEROBIC 8CC EACH BOTTLE   Culture NO GROWTH 1 DAY  Final   Report Status PENDING  Incomplete   Studies/Results: Dg Chest 1 View  02/20/2014   CLINICAL DATA:  Cough and chills.  EXAM: CHEST  1 VIEW  COMPARISON:  12/13/2013  FINDINGS: Left-sided pacemaker unchanged. Lungs somewhat hypoinflated with hazy bibasilar opacification right worse than left likely small effusions and associated atelectasis although cannot exclude infection in the lung bases. Stable cardiomegaly. Remainder of the exam is unchanged.  IMPRESSION: New hazy bibasilar right worse than left likely pleural effusions with associated atelectasis,  although cannot exclude infection in the lung bases.  Stable cardiomegaly.   Electronically Signed   By: Marin Olp M.D.   On: 02/20/2014 11:35   Ct Soft Tissue Neck Wo Contrast  02/20/2014   CLINICAL DATA:  Cough and dysphagia.  End-stage renal disease.  EXAM: CT NECK WITHOUT CONTRAST  TECHNIQUE: Multidetector CT imaging of the neck was performed following the standard protocol without intravenous contrast.  COMPARISON:  None.  FINDINGS: The visualized orbits are normal and symmetric. Visualized paranasal sinuses and mastoid air cells are clear. The visualized intracranial structures are unremarkable.  Spaces of the suprahyoid neck are within normal without evidence of focal mass, adenopathy, fluid collection or focal inflammatory change. Prevertebral soft tissues are within normal. Or pharyngeal and nasopharyngeal  airways as well as the epiglottis and subglottic airway are normal. Moderate calcified plaque at the carotid bifurcations right worse than left. Salivary glands normal and symmetric.  The infrahyoid neck is unremarkable.  Images through the upper chest demonstrate moderate bilateral pleural effusions right greater than left. Left-sided pacemaker is present. There is moderate calcified plaque over the aortic arch.  There moderate degenerate changes of the spine with multilevel disc disease over the cervical spine.  IMPRESSION: No focal mass, inflammatory process or adenopathy.  Moderate size bilateral pleural effusions right greater than left.  Moderate atherosclerotic disease at the carotid bifurcations bilaterally right worse than left.  Moderate spondylosis of the spine with multilevel disc disease over the cervical spine.   Electronically Signed   By: Marin Olp M.D.   On: 02/20/2014 11:32   US Renal  02/21/2014   CLINICAL DATA:  Acute renal failure  EXAM: RENAL/URINARY TRACT ULTRASOUND COMPLETE  COMPARISON:  None.  FINDINGS: Right Kidney:  Length: 9.5 cm.  No mass or hydronephrosis.  Left Kidney:  Length: 10.0 cm.  Poorly visualized.  No mass or hydronephrosis.  Bladder:  Underdistended.  IMPRESSION: No hydronephrosis.   Electronically Signed   By: Julian Hy M.D.   On: 02/21/2014 11:27   Micro Results: Recent Results (from the past 240 hour(s))  Culture, blood (routine x 2)     Status: None (Preliminary result)   Collection Time: 02/20/14  1:31 PM  Result Value Ref Range Status   Specimen Description BLOOD LEFT ARM DRAWN BY RN  Final   Special Requests BOTTLES DRAWN AEROBIC AND ANAEROBIC 6CC BOTTLES  Final   Culture NO GROWTH 1 DAY  Final   Report Status PENDING  Incomplete  Culture, blood (routine x 2)     Status: None (Preliminary result)   Collection Time: 02/20/14  1:42 PM  Result Value Ref Range Status   Specimen Description BLOOD LEFT ARM  Final   Special Requests   Final     BOTTLES DRAWN AEROBIC AND ANAEROBIC 8CC EACH BOTTLE   Culture NO GROWTH 1 DAY  Final   Report Status PENDING  Incomplete   Studies/Results: Dg Chest 1 View  02/20/2014   CLINICAL DATA:  Cough and chills.  EXAM: CHEST  1 VIEW  COMPARISON:  12/13/2013  FINDINGS: Left-sided pacemaker unchanged. Lungs somewhat hypoinflated with hazy bibasilar opacification right worse than left likely small effusions and associated atelectasis although cannot exclude infection in the lung bases. Stable cardiomegaly. Remainder of the exam is unchanged.  IMPRESSION: New hazy bibasilar right worse than left likely pleural effusions with associated atelectasis, although cannot exclude infection in the lung bases.  Stable cardiomegaly.   Electronically Signed   By: Marin Olp  M.D.   On: 02/20/2014 11:35   Ct Soft Tissue Neck Wo Contrast  02/20/2014   CLINICAL DATA:  Cough and dysphagia.  End-stage renal disease.  EXAM: CT NECK WITHOUT CONTRAST  TECHNIQUE: Multidetector CT imaging of the neck was performed following the standard protocol without intravenous contrast.  COMPARISON:  None.  FINDINGS: The visualized orbits are normal and symmetric. Visualized paranasal sinuses and mastoid air cells are clear. The visualized intracranial structures are unremarkable.  Spaces of the suprahyoid neck are within normal without evidence of focal mass, adenopathy, fluid collection or focal inflammatory change. Prevertebral soft tissues are within normal. Or pharyngeal and nasopharyngeal airways as well as the epiglottis and subglottic airway are normal. Moderate calcified plaque at the carotid bifurcations right worse than left. Salivary glands normal and symmetric.  The infrahyoid neck is unremarkable.  Images through the upper chest demonstrate moderate bilateral pleural effusions right greater than left. Left-sided pacemaker is present. There is moderate calcified plaque over the aortic arch.  There moderate degenerate changes of the spine  with multilevel disc disease over the cervical spine.  IMPRESSION: No focal mass, inflammatory process or adenopathy.  Moderate size bilateral pleural effusions right greater than left.  Moderate atherosclerotic disease at the carotid bifurcations bilaterally right worse than left.  Moderate spondylosis of the spine with multilevel disc disease over the cervical spine.   Electronically Signed   By: Marin Olp M.D.   On: 02/20/2014 11:32   US Renal  02/21/2014   CLINICAL DATA:  Acute renal failure  EXAM: RENAL/URINARY TRACT ULTRASOUND COMPLETE  COMPARISON:  None.  FINDINGS: Right Kidney:  Length: 9.5 cm.  No mass or hydronephrosis.  Left Kidney:  Length: 10.0 cm.  Poorly visualized.  No mass or hydronephrosis.  Bladder:  Underdistended.  IMPRESSION: No hydronephrosis.   Electronically Signed   By: Julian Hy M.D.   On: 02/21/2014 11:27   Medications:  I have reviewed the patient's current medications Scheduled Meds: . sodium chloride   Intravenous Once  . ceFEPime (MAXIPIME) IV  1 g Intravenous Q24H  . donepezil  10 mg Oral QHS  . metoprolol tartrate  12.5 mg Oral Daily  . metronidazole  500 mg Intravenous Q8H  . Oxcarbazepine  300 mg Oral BID  . [START ON 02/24/2014] pantoprazole (PROTONIX) IV  40 mg Intravenous Q12H  . sodium chloride  3 mL Intravenous Q12H  . tamsulosin  0.4 mg Oral QHS   Continuous Infusions: . sodium chloride 75 mL/hr at 02/21/14 0224  . pantoprozole (PROTONIX) infusion 8 mg/hr (02/22/14 0340)   PRN Meds:.nitroGLYCERIN, ondansetron **OR** ondansetron (ZOFRAN) IV   Assessment/Plan: #1. Possible pneumonia. He has been treated with Maxipime and metronidazole. White count is normal at 7.1. #2. Anemia. He has required transfusion support. He has been followed in hematology. Hemoglobin is 9.2. #3. Chronic GI bleed. He has been treated with Protonix. He has a history of a duodenal AVM. His last EGD was in December at which time he had no ulcer. Gastroenterology has  been consulted. #4. Chronic kidney disease. Creatinine is 4.6. Nephrology has been consulted. #5. Troponin elevations. Stable from 0.24-0.27. No symptoms of infarct. #6. Chronic diastolic heart failure. Discontinue IV fluids. Lasix is being held for now. #7. Status post pacemaker placement. #8. Thrombocytopenia. 69,000. #9. Diabetes. Stable control with nondrug therapy. Active Problems:   Anemia   Acute renal failure     LOS: 2 days   Beretta Ginsberg 02/22/2014, 7:23 AM

## 2014-02-22 NOTE — Therapy (Signed)
Clinical/Bedside Swallow Evaluation Patient Details  Name: FENTON CANDEE MRN: [id number] Date of Birth: [birth year]  Today's Date: 02/22/2014 Time: SLP Start Time (ACUTE ONLY): 1200 SLP Stop Time (ACUTE ONLY): 1224 SLP Time Calculation (min) (ACUTE ONLY): 24 min  Past Medical History:  Past Medical History  Diagnosis Date  . Gout   . Stroke 1993, 1995    chronic balance issues  . Hyperlipidemia   . Emphysema   . Hypertension   . CAD (coronary artery disease)     stent 2006  . Seizure disorder   . GERD (gastroesophageal reflux disease)   . Cancer   . Asthma   . Syncope Jan. 1, 2014  . AAA (abdominal aortic aneurysm)   . Sinus node dysfunction     s/p PPM implant April 2015 (ST Jude)  . Pneumonia   . Myocardial infarction   . CHF (congestive heart failure)   . Seizures   . Renal disorder   . CKD (chronic kidney disease) stage 4, GFR 15-29 ml/min 04/17/2013   Past Surgical History:  Past Surgical History  Procedure Laterality Date  . Cardiac catheterization  08/23/2004    bare metal stenting of the proximal circumflex  . Colonoscopy      X 2  . Colonoscopy  11/01/2011    Procedure: COLONOSCOPY;  Surgeon: Rogene Houston, MD;  Location: [facility];  Service: Endoscopy;  Laterality: N/A;  100  . Eye surgery    . Nose surgery    . Pacemaker insertion  04-29-13    STJ Assurity dual chamber pacemaker implanted by Dr Rayann Heman for syncope and sinus node dysfunction  . Esophagogastroduodenoscopy N/A 12/16/2013    Procedure: ESOPHAGOGASTRODUODENOSCOPY (EGD);  Surgeon: Rogene Houston, MD;  Location: [facility];  Service: Endoscopy;  Laterality: N/A;  . Permanent pacemaker insertion N/A 04/29/2013    Procedure: PERMANENT PACEMAKER INSERTION;  Surgeon: Coralyn Mark, MD;  Location: [facility];  Service: Cardiovascular;  Laterality: N/A;  . Tongue biopsy Right 12/2013   HPI:  Patient is 81 year old Caucasian male with multiple medical problems who was at this facility in December  2015 when he was evaluated for heme positive stool and anemia. He was discharged to [facility] for rehabilitation. He improved and was able to come home just before Christmas. Patient did well for 5-6 weeks. Over the last 2-3 weeks he has developed progressive weakness decrease in appetite. He had lost few pounds. He was brought to emergency room 2 days ago. He was noted to have heme-positive stool. Also felt to have pneumonia and therefore hospitalized. Patient was also felt to have acute on chronic renal failure secondary to dehydration and has been evaluated by Dr. Hinda Lenis of nephrology service. Patient states he does not feel well. He has dyspnea with minimal exertion. He become short of breath even when he eats his meals. He is chronically on nasal O2. He denies nausea vomiting heartburn or dysphagia. Back in December 2015 was hospitalized for anemia and occult GI bleed. He underwent esophagogastroduodenoscopy and he was noted to have fresh blood and hypopharynx and in upper airway. No bleeding lesion was identified in upper GI tract. He had unremarkable chest CT angiogram and subsequent was seen by Dr. Benjamine Mola and no lesion was found in his of pharynx to account for blood loss. Pt endorses difficulty swallowing at times. SLP asked to evaluate swallow.   Assessment / Plan / Recommendation Clinical Impression  [name] presents with mild wet/congested vocal quality.  He endorses difficulty swallowing at times-with both solids and liquids. He reports that "things won't go down". Oral motor examination is essentially unremarkable except for reduced velar elevation (pt somewhat lethargic however) and audible congestion in pharynx. He shows no overt signs or symptoms of aspiration during presentation of mechanical soft textures, puree, and thin liquids. Current chest x-ray is worrisome for possible aspiration PNA and pt with subjective report of "getting choked" so MBSS may be prudent to rule out aspiration  and identify appropriate diet textures. Discussed with pt and family and they are in agreement with plan for MBSS tomorrow. OK to continue current diet (soft and thin) with standard aspiration precautions.    Aspiration Risk  Mild    Diet Recommendation Dysphagia 3 (Mechanical Soft);Thin liquid   Liquid Administration via: Cup;Straw Medication Administration: Whole meds with liquid Supervision: Patient able to self feed;Intermittent supervision to cue for compensatory strategies Compensations: Slow rate;Small sips/bites Postural Changes and/or Swallow Maneuvers: Seated upright 90 degrees;Upright 30-60 min after meal    Other  Recommendations Recommended Consults: MBS Oral Care Recommendations: Oral care BID Other Recommendations: Clarify dietary restrictions   Follow Up Recommendations  24 hour supervision/assistance    Frequency and Duration min 2x/week  1 week   Pertinent Vitals/Pain VSS    SLP Swallow Goals   Pt will demonstrate safe and efficient consumption of least restrictive diet with use of strategies as needed.   Swallow Study Prior Functional Status   24 hour supervision    General Date of Onset: 02/20/14 HPI: Patient is 81 year old Caucasian male with multiple medical problems who was at this facility in December 2015 when he was evaluated for heme positive stool and anemia. He was discharged to [facility] for rehabilitation. He improved and was able to come home just before Christmas. Patient did well for 5-6 weeks. Over the last 2-3 weeks he has developed progressive weakness decrease in appetite. He had lost few pounds. He was brought to emergency room 2 days ago. He was noted to have heme-positive stool. Also felt to have pneumonia and therefore hospitalized. Patient was also felt to have acute on chronic renal failure secondary to dehydration and has been evaluated by Dr. Hinda Lenis of nephrology service. Patient states he does not feel well. He has dyspnea  with minimal exertion. He become short of breath even when he eats his meals. He is chronically on nasal O2. He denies nausea vomiting heartburn or dysphagia. Back in December 2015 was hospitalized for anemia and occult GI bleed. He underwent esophagogastroduodenoscopy and he was noted to have fresh blood and hypopharynx and in upper airway. No bleeding lesion was identified in upper GI tract. He had unremarkable chest CT angiogram and subsequent was seen by Dr. Benjamine Mola and no lesion was found in his of pharynx to account for blood loss. Pt endorses difficulty swallowing at times. SLP asked to evaluate swallow. Type of Study: Bedside swallow evaluation Diet Prior to this Study: Dysphagia 3 (soft);Thin liquids Temperature Spikes Noted: No Respiratory Status: Room air History of Recent Intubation: No Behavior/Cognition: Cooperative;Pleasant mood Oral Cavity - Dentition: Adequate natural dentition Self-Feeding Abilities: Able to feed self Patient Positioning: Upright in bed Baseline Vocal Quality: Wet (congested) Volitional Cough: Congested;Strong Volitional Swallow: Able to elicit    Oral/Motor/Sensory Function Overall Oral Motor/Sensory Function: Appears within functional limits for tasks assessed Labial ROM: Within Functional Limits Labial Symmetry: Within Functional Limits Labial Strength: Within Functional Limits Labial Sensation: Within Functional Limits Lingual ROM: Within Functional Limits Lingual  Symmetry: Within Functional Limits Lingual Strength: Within Functional Limits Lingual Sensation: Within Functional Limits Facial ROM: Within Functional Limits Facial Symmetry: Within Functional Limits Facial Strength: Within Functional Limits Facial Sensation: Within Functional Limits Velum: Impaired right;Impaired left Mandible: Within Functional Limits   Ice Chips Ice chips: Within functional limits Presentation: Spoon   Thin Liquid Thin Liquid: Within functional limits Presentation:  Self Fed;Cup;Straw    Nectar Thick Nectar Thick Liquid: Not tested   Honey Thick Honey Thick Liquid: Not tested   Puree Puree: Within functional limits   Solid   Thank you,  Genene Churn, CCC-SLP [phone]     Solid: Within functional limits (mild lingual residuals) Presentation: Self Fed       PORTER,DABNEY 02/22/2014,4:11 PM

## 2014-02-22 NOTE — Consult Note (Signed)
Reason for Consult: Acute kidney injury superimposed on chronic Referring Physician: Dr. Augusto Gamble [name] is an 81 y.o. male.  HPI: This is a patient who has history of CVA, seizure disorder, bradycardia, chronic renal failure stage IV presently was brought by family because of nausea, vomiting and poor appetite for the last 2-3 weeks after he was discharged from SNF. Patient however seems to have nausea and vomiting even before that and was put on Zofran. However the last couple of days his urine output has declined and so was brought to the emergency room. Presently patient was found to have worsening of his renal failure and possible pneumonia he is admitted to the hospital. Patient states that he is not feeling good. Still has some nausea. Patient denies any difficulty in breathing, no orthopnea.  Past Medical History  Diagnosis Date  . Gout   . Stroke 1993, 1995    chronic balance issues  . Hyperlipidemia   . Emphysema   . Hypertension   . CAD (coronary artery disease)     stent 2006  . Seizure disorder   . GERD (gastroesophageal reflux disease)   . Cancer   . Asthma   . Syncope Jan. 1, 2014  . AAA (abdominal aortic aneurysm)   . Sinus node dysfunction     s/p PPM implant April 2015 ([employer])  . Pneumonia   . Myocardial infarction   . CHF (congestive heart failure)   . Seizures   . Renal disorder   . CKD (chronic kidney disease) stage 4, GFR 15-29 ml/min 04/17/2013    Past Surgical History  Procedure Laterality Date  . Cardiac catheterization  08/23/2004    bare metal stenting of the proximal circumflex  . Colonoscopy      X 2  . Colonoscopy  11/01/2011    Procedure: COLONOSCOPY;  Surgeon: Rogene Houston, MD;  Location: [facility];  Service: Endoscopy;  Laterality: N/A;  100  . Eye surgery    . Nose surgery    . Pacemaker insertion  04-29-13    STJ Assurity dual chamber pacemaker implanted by Dr Rayann Heman for syncope and sinus node dysfunction  .  Esophagogastroduodenoscopy N/A 12/16/2013    Procedure: ESOPHAGOGASTRODUODENOSCOPY (EGD);  Surgeon: Rogene Houston, MD;  Location: [facility];  Service: Endoscopy;  Laterality: N/A;  . Permanent pacemaker insertion N/A 04/29/2013    Procedure: PERMANENT PACEMAKER INSERTION;  Surgeon: Coralyn Mark, MD;  Location: [facility];  Service: Cardiovascular;  Laterality: N/A;  . Tongue biopsy Right 12/2013    Family History  Problem Relation Age of Onset  . Emphysema Brother   . Heart disease Sister   . Cancer Brother     lung  . Diabetes Brother   . Cancer Brother     throat  . Diabetes Brother   . Rheum arthritis Mother   . Arthritis Mother   . Rheum arthritis Father   . Colon cancer Neg Hx   . Diabetes    . Stroke Mother   . Heart attack    . Heart attack Sister     Social History:  reports that he quit smoking about 24 years ago. His smoking use included Cigarettes. He has a 15 pack-year smoking history. He has never used smokeless tobacco. He reports that he does not drink alcohol or use illicit drugs.  Allergies:  Allergies  Allergen Reactions  . Penicillins Itching    Medications: I have reviewed the patient's current  medications.  Results for orders placed or performed during the hospital encounter of 02/20/14 (from the past 48 hour(s))  CBC with Differential/Platelet     Status: Abnormal   Collection Time: 02/20/14 10:16 AM  Result Value Ref Range   WBC 6.4 4.0 - 10.5 K/uL   RBC 2.61 (L) 4.22 - 5.81 MIL/uL   Hemoglobin 8.4 (L) 13.0 - 17.0 g/dL   HCT 26.0 (L) 39.0 - 52.0 %   MCV 99.6 78.0 - 100.0 fL   MCH 32.2 26.0 - 34.0 pg   MCHC 32.3 30.0 - 36.0 g/dL   RDW 19.2 (H) 11.5 - 15.5 %   Platelets 75 (L) 150 - 400 K/uL    Comment: SPECIMEN CHECKED FOR CLOTS PLATELETS APPEAR DECREASED PLATELET COUNT CONFIRMED BY SMEAR    Neutrophils Relative % 53 43 - 77 %   Neutro Abs 3.4 1.7 - 7.7 K/uL   Lymphocytes Relative 31 12 - 46 %   Lymphs Abs 2.0 0.7 - 4.0 K/uL    Monocytes Relative 10 3 - 12 %   Monocytes Absolute 0.6 0.1 - 1.0 K/uL   Eosinophils Relative 5 0 - 5 %   Eosinophils Absolute 0.3 0.0 - 0.7 K/uL   Basophils Relative 0 0 - 1 %   Basophils Absolute 0.0 0.0 - 0.1 K/uL  Troponin I     Status: Abnormal   Collection Time: 02/20/14 10:16 AM  Result Value Ref Range   Troponin I 0.24 (H) <0.031 ng/mL    Comment:        PERSISTENTLY INCREASED TROPONIN VALUES IN THE RANGE OF 0.04-0.49 ng/mL CAN BE SEEN IN:       -UNSTABLE ANGINA       -CONGESTIVE HEART FAILURE       -MYOCARDITIS       -CHEST TRAUMA       -ARRYHTHMIAS       -LATE PRESENTING MYOCARDIAL INFARCTION       -COPD   CLINICAL FOLLOW-UP RECOMMENDED.   Comprehensive metabolic panel     Status: Abnormal   Collection Time: 02/20/14 10:16 AM  Result Value Ref Range   Sodium 141 135 - 145 mmol/L   Potassium 3.9 3.5 - 5.1 mmol/L   Chloride 106 96 - 112 mmol/L   CO2 30 19 - 32 mmol/L   Glucose, Bld 103 (H) 70 - 99 mg/dL   BUN 48 (H) 6 - 23 mg/dL   Creatinine, Ser 4.93 (H) 0.50 - 1.35 mg/dL   Calcium 8.6 8.4 - 10.5 mg/dL   Total Protein 5.9 (L) 6.0 - 8.3 g/dL   Albumin 3.1 (L) 3.5 - 5.2 g/dL   AST 18 0 - 37 U/L   ALT 13 0 - 53 U/L   Alkaline Phosphatase 101 39 - 117 U/L   Total Bilirubin 0.7 0.3 - 1.2 mg/dL   GFR calc non Af Amer 9 (L) >90 mL/min   GFR calc Af Amer 11 (L) >90 mL/min    Comment: (NOTE) The eGFR has been calculated using the CKD EPI equation. This calculation has not been validated in all clinical situations. eGFR's persistently <90 mL/min signify possible Chronic Kidney Disease.    Anion gap 5 5 - 15  Lipase, blood     Status: None   Collection Time: 02/20/14 10:16 AM  Result Value Ref Range   Lipase 24 11 - 59 U/L  Culture, blood (routine x 2)     Status: None (Preliminary result)   Collection Time: 02/20/14  1:31 PM  Result Value Ref Range   Specimen Description BLOOD LEFT ARM DRAWN BY RN    Special Requests BOTTLES DRAWN AEROBIC AND ANAEROBIC 6CC  BOTTLES    Culture NO GROWTH 1 DAY    Report Status PENDING   Culture, blood (routine x 2)     Status: None (Preliminary result)   Collection Time: 02/20/14  1:42 PM  Result Value Ref Range   Specimen Description BLOOD LEFT ARM    Special Requests      BOTTLES DRAWN AEROBIC AND ANAEROBIC 8CC EACH BOTTLE   Culture NO GROWTH 1 DAY    Report Status PENDING   Type and screen     Status: None   Collection Time: 02/20/14  1:42 PM  Result Value Ref Range   ABO/RH(D) A POS    Antibody Screen NEG    Sample Expiration 02/23/2014    Unit Number [id number]    Blood Component Type RED CELLS,LR    Unit division 00    Status of Unit ISSUED,FINAL    Transfusion Status OK TO TRANSFUSE    Crossmatch Result Compatible   Protime-INR     Status: None   Collection Time: 02/20/14  1:42 PM  Result Value Ref Range   Prothrombin Time 13.6 11.6 - 15.2 seconds   INR 1.03 0.00 - 1.49  Prepare RBC     Status: None   Collection Time: 02/20/14  3:00 PM  Result Value Ref Range   Order Confirmation ORDER PROCESSED BY BLOOD BANK   Troponin I (q 6hr x 3)     Status: Abnormal   Collection Time: 02/20/14  4:19 PM  Result Value Ref Range   Troponin I 0.26 (H) <0.031 ng/mL    Comment:        PERSISTENTLY INCREASED TROPONIN VALUES IN THE RANGE OF 0.04-0.49 ng/mL CAN BE SEEN IN:       -UNSTABLE ANGINA       -CONGESTIVE HEART FAILURE       -MYOCARDITIS       -CHEST TRAUMA       -ARRYHTHMIAS       -LATE PRESENTING MYOCARDIAL INFARCTION       -COPD   CLINICAL FOLLOW-UP RECOMMENDED.   Urinalysis, Routine w reflex microscopic     Status: Abnormal   Collection Time: 02/20/14  4:36 PM  Result Value Ref Range   Color, Urine YELLOW YELLOW   APPearance CLEAR CLEAR   Specific Gravity, Urine 1.020 1.005 - 1.030   pH 5.5 5.0 - 8.0   Glucose, UA NEGATIVE NEGATIVE mg/dL   Hgb urine dipstick MODERATE (A) NEGATIVE   Bilirubin Urine NEGATIVE NEGATIVE   Ketones, ur NEGATIVE NEGATIVE mg/dL   Protein, ur TRACE (A)  NEGATIVE mg/dL   Urobilinogen, UA 0.2 0.0 - 1.0 mg/dL   Nitrite NEGATIVE NEGATIVE   Leukocytes, UA SMALL (A) NEGATIVE  Urine microscopic-add on     Status: None   Collection Time: 02/20/14  4:36 PM  Result Value Ref Range   WBC, UA TOO NUMEROUS TO COUNT <3 WBC/hpf   RBC / HPF TOO NUMEROUS TO COUNT <3 RBC/hpf  Troponin I (q 6hr x 3)     Status: Abnormal   Collection Time: 02/20/14  9:52 PM  Result Value Ref Range   Troponin I 0.27 (H) <0.031 ng/mL    Comment:        PERSISTENTLY INCREASED TROPONIN VALUES IN THE RANGE OF 0.04-0.49 ng/mL CAN BE SEEN IN:       -  UNSTABLE ANGINA       -CONGESTIVE HEART FAILURE       -MYOCARDITIS       -CHEST TRAUMA       -ARRYHTHMIAS       -LATE PRESENTING MYOCARDIAL INFARCTION       -COPD   CLINICAL FOLLOW-UP RECOMMENDED.   Troponin I (q 6hr x 3)     Status: Abnormal   Collection Time: 02/21/14  4:33 AM  Result Value Ref Range   Troponin I 0.26 (H) <0.031 ng/mL    Comment:        PERSISTENTLY INCREASED TROPONIN VALUES IN THE RANGE OF 0.04-0.49 ng/mL CAN BE SEEN IN:       -UNSTABLE ANGINA       -CONGESTIVE HEART FAILURE       -MYOCARDITIS       -CHEST TRAUMA       -ARRYHTHMIAS       -LATE PRESENTING MYOCARDIAL INFARCTION       -COPD   CLINICAL FOLLOW-UP RECOMMENDED.   CBC     Status: Abnormal   Collection Time: 02/21/14  4:33 AM  Result Value Ref Range   WBC 7.1 4.0 - 10.5 K/uL   RBC 2.80 (L) 4.22 - 5.81 MIL/uL   Hemoglobin 8.8 (L) 13.0 - 17.0 g/dL   HCT 27.3 (L) 39.0 - 52.0 %   MCV 97.5 78.0 - 100.0 fL   MCH 31.4 26.0 - 34.0 pg   MCHC 32.2 30.0 - 36.0 g/dL   RDW 18.1 (H) 11.5 - 15.5 %   Platelets 69 (L) 150 - 400 K/uL    Comment: SPECIMEN CHECKED FOR CLOTS LARGE PLATELETS PRESENT PLATELET COUNT CONFIRMED BY SMEAR   Basic metabolic panel     Status: Abnormal   Collection Time: 02/21/14  4:33 AM  Result Value Ref Range   Sodium 142 135 - 145 mmol/L   Potassium 3.9 3.5 - 5.1 mmol/L   Chloride 108 96 - 112 mmol/L   CO2 26 19 -  32 mmol/L   Glucose, Bld 97 70 - 99 mg/dL   BUN 49 (H) 6 - 23 mg/dL   Creatinine, Ser 4.67 (H) 0.50 - 1.35 mg/dL   Calcium 8.5 8.4 - 10.5 mg/dL   GFR calc non Af Amer 10 (L) >90 mL/min   GFR calc Af Amer 12 (L) >90 mL/min    Comment: (NOTE) The eGFR has been calculated using the CKD EPI equation. This calculation has not been validated in all clinical situations. eGFR's persistently <90 mL/min signify possible Chronic Kidney Disease.    Anion gap 8 5 - 15  Reticulocytes     Status: Abnormal   Collection Time: 02/21/14 12:58 PM  Result Value Ref Range   Retic Ct Pct 1.1 0.4 - 3.1 %   RBC. 3.05 (L) 4.22 - 5.81 MIL/uL   Retic Count, Manual 33.6 19.0 - 186.0 K/uL  Hemoglobin and hematocrit, blood     Status: Abnormal   Collection Time: 02/21/14 12:58 PM  Result Value Ref Range   Hemoglobin 9.5 (L) 13.0 - 17.0 g/dL   HCT 29.8 (L) 39.0 - 52.0 %  Hemoglobin and hematocrit, blood     Status: Abnormal   Collection Time: 02/21/14  9:11 PM  Result Value Ref Range   Hemoglobin 9.2 (L) 13.0 - 17.0 g/dL   HCT 28.5 (L) 39.0 - 16.9 %  Basic metabolic panel     Status: Abnormal   Collection Time: 02/22/14  6:06 AM  Result  Value Ref Range   Sodium 141 135 - 145 mmol/L   Potassium 4.0 3.5 - 5.1 mmol/L   Chloride 108 96 - 112 mmol/L   CO2 24 19 - 32 mmol/L   Glucose, Bld 84 70 - 99 mg/dL   BUN 46 (H) 6 - 23 mg/dL   Creatinine, Ser 4.48 (H) 0.50 - 1.35 mg/dL   Calcium 8.5 8.4 - 10.5 mg/dL   GFR calc non Af Amer 10 (L) >90 mL/min   GFR calc Af Amer 12 (L) >90 mL/min    Comment: (NOTE) The eGFR has been calculated using the CKD EPI equation. This calculation has not been validated in all clinical situations. eGFR's persistently <90 mL/min signify possible Chronic Kidney Disease.    Anion gap 9 5 - 15    Dg Chest 1 View  02/20/2014   CLINICAL DATA:  Cough and chills.  EXAM: CHEST  1 VIEW  COMPARISON:  12/13/2013  FINDINGS: Left-sided pacemaker unchanged. Lungs somewhat hypoinflated with  hazy bibasilar opacification right worse than left likely small effusions and associated atelectasis although cannot exclude infection in the lung bases. Stable cardiomegaly. Remainder of the exam is unchanged.  IMPRESSION: New hazy bibasilar right worse than left likely pleural effusions with associated atelectasis, although cannot exclude infection in the lung bases.  Stable cardiomegaly.   Electronically Signed   By: Marin Olp M.D.   On: 02/20/2014 11:35   Ct Soft Tissue Neck Wo Contrast  02/20/2014   CLINICAL DATA:  Cough and dysphagia.  End-stage renal disease.  EXAM: CT NECK WITHOUT CONTRAST  TECHNIQUE: Multidetector CT imaging of the neck was performed following the standard protocol without intravenous contrast.  COMPARISON:  None.  FINDINGS: The visualized orbits are normal and symmetric. Visualized paranasal sinuses and mastoid air cells are clear. The visualized intracranial structures are unremarkable.  Spaces of the suprahyoid neck are within normal without evidence of focal mass, adenopathy, fluid collection or focal inflammatory change. Prevertebral soft tissues are within normal. Or pharyngeal and nasopharyngeal airways as well as the epiglottis and subglottic airway are normal. Moderate calcified plaque at the carotid bifurcations right worse than left. Salivary glands normal and symmetric.  The infrahyoid neck is unremarkable.  Images through the upper chest demonstrate moderate bilateral pleural effusions right greater than left. Left-sided pacemaker is present. There is moderate calcified plaque over the aortic arch.  There moderate degenerate changes of the spine with multilevel disc disease over the cervical spine.  IMPRESSION: No focal mass, inflammatory process or adenopathy.  Moderate size bilateral pleural effusions right greater than left.  Moderate atherosclerotic disease at the carotid bifurcations bilaterally right worse than left.  Moderate spondylosis of the spine with  multilevel disc disease over the cervical spine.   Electronically Signed   By: Marin Olp M.D.   On: 02/20/2014 11:32   US Renal  02/21/2014   CLINICAL DATA:  Acute renal failure  EXAM: RENAL/URINARY TRACT ULTRASOUND COMPLETE  COMPARISON:  None.  FINDINGS: Right Kidney:  Length: 9.5 cm.  No mass or hydronephrosis.  Left Kidney:  Length: 10.0 cm.  Poorly visualized.  No mass or hydronephrosis.  Bladder:  Underdistended.  IMPRESSION: No hydronephrosis.   Electronically Signed   By: Julian Hy M.D.   On: 02/21/2014 11:27    Review of Systems  Constitutional: Negative for weight loss.  Respiratory: Negative for shortness of breath.   Cardiovascular: Negative for orthopnea and leg swelling.  Gastrointestinal: Positive for nausea, vomiting and abdominal pain.  Black stool  Genitourinary:       Decreased urine output  Neurological: Positive for weakness.   Blood pressure 122/74, pulse 82, temperature 98.8 F (37.1 C), temperature source Oral, resp. rate 16, height 5' 10"  (1.778 m), weight 76.204 kg (168 lb), SpO2 96 %. Physical Exam  Constitutional: No distress.  Eyes: Left eye exhibits no discharge.  Neck: No JVD present.  Cardiovascular: Normal rate.   Respiratory: He has wheezes. He has no rales.  GI: He exhibits no distension. There is no tenderness. There is no rebound.  Musculoskeletal: He exhibits no edema.  Neurological: He is alert.    Assessment/Plan: Problem #1 acute kidney injury: BUN and creatinine is higher than his baseline. Most likely secondary to prerenal syndrome as patient by mouth intake was very poor. His pending creatinine is slightly better since he came and patient is non-oliguric. Problem #2 chronic renal failure: His creatinine was 2.44  on 4/6 2015. His EGFR is 22 mL/m hence stage IV. Etiology was thought to be secondary to diabetes versus hypertension versus ischemia. Problem #3 history of coronary artery disease: Presently he doesn't have any chest  pain Problem #4 history of seizure disorder Problem #6 history of aortic aneurysm Problem #6 history of bradycardia Problem #7 anemia: His hemoglobin and hematocrit is below range. Plan: Will increase his IV fluid to 1 25 mL per hour. We'll check his basic metabolic panel and phosphorus in the morning. Once patient is well hydrated his urine output doesn't improve we may use diuretics.   Kyrianna Barletta S 02/22/2014, 8:44 AM

## 2014-02-22 NOTE — Progress Notes (Signed)
[facility] chart review completed.  

## 2014-02-22 NOTE — Consult Note (Signed)
Referring Provider: Lucia Gaskins, MD Primary Care Physician:  Asencion Noble, MD Primary Gastroenterologist:  Dr. Laural Golden  Reason for Consultation:    Anemia and chronic GI bleed.  HPI:   History of obtained from patient and family members.  Patient is 81 year old Caucasian male with multiple medical problems who was at this facility in December 2015 when he was evaluated for heme positive stool and anemia. He was discharged to [facility] for rehabilitation. He improved and was able to come home just before Christmas. Patient did well for 5-6 weeks. Over the last 2-3 weeks he has developed progressive weakness decrease in appetite. He had lost few pounds. He was brought to emergency room 2 days ago. He was noted to have heme-positive stool. Also felt to have pneumonia and therefore hospitalized. Patient was also felt to have acute on chronic renal failure secondary to dehydration and has been evaluated by Dr. Hinda Lenis of nephrology service. Patient states he does not feel well. He has dyspnea with minimal exertion. He become short of breath even when he eats his meals. He is chronically on nasal O2. He denies nausea vomiting heartburn or dysphagia. Prior to admission he did have fleeting right-sided chest pain. He denies abdominal pain or rectal bleeding. His stools have been black because he's been on by mouth iron. Patient has been receiving Aranesp via speciality clinic every 2-3 weeks. Hemoglobin was noted to be 7.1 g on 1 07/11/2014 and he was given 1 unit of PRBCs. Back in December 2015 was hospitalized for anemia and occult GI bleed. He underwent esophagogastroduodenoscopy and he was noted to have fresh blood and hypopharynx and in upper airway. No bleeding lesion was identified in upper GI tract. He had unremarkable chest CT angiogram and subsequent was seen by Dr. Benjamine Mola and no lesion was found in his of pharynx to account for blood loss. His last colonoscopy was in October 2013 because  of change in bowel habits with worsening constipation and intermittent hematochezia. He had mild sigmoid colon diverticulosis and external hemorrhoids.   Past Medical History  Diagnosis Date  . Gout   . Stroke 1993, 1995    chronic balance issues  . Hyperlipidemia   . Emphysema   . Hypertension   . CAD (coronary artery disease)     stent 2006  . Seizure disorder   . GERD (gastroesophageal reflux disease)   . Cancer   . Asthma   . Syncope Jan. 1, 2014  . AAA (abdominal aortic aneurysm)   . Sinus node dysfunction     s/p PPM implant April 2015 (ST Jude)  . Pneumonia   . Myocardial infarction   . CHF (congestive heart failure)   . Seizures   . Renal disorder   . CKD (chronic kidney disease) stage 4, GFR 15-29 ml/min 04/17/2013    Past Surgical History  Procedure Laterality Date  . Cardiac catheterization  08/23/2004    bare metal stenting of the proximal circumflex  . Colonoscopy      X 2  . Colonoscopy  11/01/2011    Procedure: COLONOSCOPY;  Surgeon: Rogene Houston, MD;  [facility];  Service: Endoscopy;  Laterality: N/A;  100  . Eye surgery    . Nose surgery    . Pacemaker insertion  04-29-13    STJ Assurity dual chamber pacemaker implanted by Dr Rayann Heman for syncope and sinus node dysfunction  . Esophagogastroduodenoscopy N/A 12/16/2013    Procedure: ESOPHAGOGASTRODUODENOSCOPY (EGD);  Surgeon: Rogene Houston, MD;  [facility];  Service: Endoscopy;  Laterality: N/A;  . Permanent pacemaker insertion N/A 04/29/2013    Procedure: PERMANENT PACEMAKER INSERTION;  Surgeon: Coralyn Mark, MD;  Location: [facility];  Service: Cardiovascular;  Laterality: N/A;  . Tongue biopsy Right 12/2013    Prior to Admission medications   Medication Sig Start Date End Date Taking? Authorizing Provider  allopurinol (ZYLOPRIM) 100 MG tablet Take 1 tablet (100 mg total) by mouth every morning. 10/28/13  Yes Asencion Noble, MD  donepezil (ARICEPT) 10 MG tablet Take 10 mg by  mouth at bedtime.    Yes Historical Provider, MD  famotidine (PEPCID) 20 MG tablet TAKE ONE TABLET BY MOUTH TWICE DAILY. 12/22/13  Yes Elsie Stain, MD  ferrous gluconate (FERGON) 324 MG tablet Take 1 tablet (324 mg total) by mouth daily with breakfast. 12/17/13  Yes Asencion Noble, MD  isosorbide mononitrate (IMDUR) 30 MG 24 hr tablet Take 0.5 tablets (15 mg total) by mouth daily. 08/26/13  Yes Scott Joylene Draft, PA-C  Melatonin ([employer] MELATONIN) 3 MG TABS Take 3 mg by mouth at bedtime.   Yes Historical Provider, MD  nitroGLYCERIN (NITROSTAT) 0.4 MG SL tablet Place 1 tablet (0.4 mg total) under the tongue every 5 (five) minutes as needed for chest pain. 1 tablet under tongue at onset of chest pain;you may repeat every 5 minutes for up to 3 doses. 10/03/12  Yes Herminio Commons, MD  Oxcarbazepine (TRILEPTAL) 300 MG tablet Take 300 mg by mouth 2 (two) times daily.    Yes Historical Provider, MD  polyethylene glycol powder (GLYCOLAX/MIRALAX) powder Take 17 g by mouth daily. *Mixed in 4-8 ounces of liquid and drink daily   Yes Historical Provider, MD  pravastatin (PRAVACHOL) 40 MG tablet Take 40 mg by mouth every evening.    Yes Historical Provider, MD  Tamsulosin HCl (FLOMAX) 0.4 MG CAPS Take 0.4 mg by mouth at bedtime.    Yes Historical Provider, MD  torsemide (DEMADEX) 20 MG tablet Take 1 tablet (20 mg total) by mouth daily. 10/28/13  Yes Asencion Noble, MD  VITAMIN B1-B12 IM Inject into the muscle every 30 (thirty) days. Once a month   Yes Historical Provider, MD  Linaclotide (LINZESS) 145 MCG CAPS capsule Take 1 capsule (145 mcg total) by mouth daily. Patient not taking: Reported on 01/19/2014 08/03/13   Butch Penny, NP  metoprolol tartrate (LOPRESSOR) 25 MG tablet TAKE ONE HALF TABLET BY MOUTH TWICE DAILY. 02/22/14   Arnoldo Lenis, MD  tiotropium (SPIRIVA HANDIHALER) 18 MCG inhalation capsule Place 1 capsule (18 mcg total) into inhaler and inhale daily. Patient not taking: Reported on 01/19/2014 12/23/13    Elsie Stain, MD    Current Facility-Administered Medications  Medication Dose Route Frequency Provider Last Rate Last Dose  . 0.9 %  sodium chloride infusion   Intravenous Continuous Harriett Sine, MD 75 mL/hr at 02/22/14 1054    . 0.9 %  sodium chloride infusion   Intravenous Once Hosie Poisson, MD      . ceFEPIme (MAXIPIME) 1 g in dextrose 5 % 50 mL IVPB  1 g Intravenous Q24H Rexene Alberts, MD   1 g at 02/21/14 1449  . donepezil (ARICEPT) tablet 10 mg  10 mg Oral QHS Hosie Poisson, MD   10 mg at 02/21/14 2315  . furosemide (LASIX) injection 80 mg  80 mg Intravenous BID Harriett Sine, MD   80 mg at 02/22/14 1203  . metoprolol tartrate (LOPRESSOR)  tablet 12.5 mg  12.5 mg Oral Daily Hosie Poisson, MD   12.5 mg at 02/22/14 1029  . metroNIDAZOLE (FLAGYL) IVPB 500 mg  500 mg Intravenous Q8H Tanna Furry, MD   500 mg at 02/22/14 0757  . nitroGLYCERIN (NITROSTAT) SL tablet 0.4 mg  0.4 mg Sublingual Q5 min PRN Hosie Poisson, MD      . ondansetron (ZOFRAN) tablet 4 mg  4 mg Oral Q6H PRN Hosie Poisson, MD   4 mg at 02/22/14 1203   Or  . ondansetron (ZOFRAN) injection 4 mg  4 mg Intravenous Q6H PRN Hosie Poisson, MD      . Oxcarbazepine (TRILEPTAL) tablet 300 mg  300 mg Oral BID Hosie Poisson, MD   300 mg at 02/22/14 1030  . pantoprazole (PROTONIX) 80 mg in sodium chloride 0.9 % 250 mL (0.32 mg/mL) infusion  8 mg/hr Intravenous Continuous Tanna Furry, MD 25 mL/hr at 02/22/14 0340 8 mg/hr at 02/22/14 0340  . [START ON 02/24/2014] pantoprazole (PROTONIX) injection 40 mg  40 mg Intravenous Q12H Tanna Furry, MD      . sodium chloride 0.9 % injection 3 mL  3 mL Intravenous Q12H Hosie Poisson, MD   3 mL at 02/21/14 2200  . tamsulosin (FLOMAX) capsule 0.4 mg  0.4 mg Oral QHS Hosie Poisson, MD   0.4 mg at 02/21/14 2316    Allergies as of 02/20/2014 - Review Complete 02/20/2014  Allergen Reaction Noted  . Penicillins Itching 07/15/2008    Family History  Problem Relation Age of Onset  . Emphysema  Brother   . Heart disease Sister   . Cancer Brother     lung  . Diabetes Brother   . Cancer Brother     throat  . Diabetes Brother   . Rheum arthritis Mother   . Arthritis Mother   . Rheum arthritis Father   . Colon cancer Neg Hx   . Diabetes    . Stroke Mother   . Heart attack    . Heart attack Sister     History   Social History  . Marital Status: Widowed    Spouse Name: N/A    Number of Children: 2  . Years of Education: N/A   Occupational History  . Retired Multimedia programmer   Social History Main Topics  . Smoking status: Former Smoker -- 0.30 packs/day for 50 years    Types: Cigarettes    Quit date: 01/15/1990  . Smokeless tobacco: Never Used  . Alcohol Use: No  . Drug Use: No  . Sexual Activity: Not on file   Other Topics Concern  . Not on file   Social History Narrative    Review of Systems: See HPI, otherwise normal ROS  Physical Exam: Temp:  [97.5 F (36.4 C)-98.8 F (37.1 C)] 98 F (36.7 C) (02/08 0900) Pulse Rate:  [75-87] 87 (02/08 0900) Resp:  [16-18] 16 (02/08 0417) BP: (100-122)/(47-74) 102/53 mmHg (02/08 0900) SpO2:  [96 %-100 %] 97 % (02/08 0900) Weight:  [168 lb (76.204 kg)] 168 lb (76.204 kg) (02/08 0247) Last BM Date: 02/19/14 Pleasant well-developed well-nourished Caucasian male who is in no acute distress. Conjunctiva is pale. Sclerae nonicteric. Oropharyngeal mucosa is normal. Dentition in satisfactory condition. No neck masses or thyromegaly noted. Cardiac exam with regular rhythm normal S1 and S2. No murmur or gallop noted. Abdomen is full. Bowel sounds are normal. On palpation abdomen is soft and nontender. Liver edge is palpable 10 cm below costal  margin laterally. There is a separate structure also palpable which is firm. No prior for edema or clubbing noted. Lab Results:  Recent Labs  02/20/14 1016 02/21/14 0433 02/21/14 1258 02/21/14 2111  WBC 6.4 7.1  --   --   HGB 8.4* 8.8* 9.5* 9.2*  HCT 26.0* 27.3* 29.8*  28.5*  PLT 75* 69*  --   --    BMET  Recent Labs  02/20/14 1016 02/21/14 0433 02/22/14 0606  NA 141 142 141  K 3.9 3.9 4.0  CL 106 108 108  CO2 30 26 24   GLUCOSE 103* 97 84  BUN 48* 49* 46*  CREATININE 4.93* 4.67* 4.48*  CALCIUM 8.6 8.5 8.5   LFT  Recent Labs  02/20/14 1016  PROT 5.9*  ALBUMIN 3.1*  AST 18  ALT 13  ALKPHOS 101  BILITOT 0.7   PT/INR  Recent Labs  02/20/14 1342  LABPROT 13.6  INR 1.03   Serum iron 152, TIBC 242 and saturation 63% Serum ferritin 92 Folate 5.3 and B12 level 964.  Assessment;  Patient's GI problems can be summarized as below. #1. Occult GI bleed. Source of occult GI bleed is unclear. No lesion was found in upper GI tract on EGD of 12/16/2013 when blood was noted in upper airway and hypopharynx. Source of heme positive stool could be lungs based on EGD findings. He could also have small bowel or colonic AV malformations. His hemoglobin has not dropped since he received a unit of PRBCs on 02/10/2014. Given patient's overall condition I would not recommend further evaluation at this time. #2. Anemia. Anemia appears to be multifactorial. He previously has been documented to have iron deficiency but current iron studies do not reflect that. #3. Abnormal abdominal exam. Suspect hepatic ptosis rather than hepatomegaly he also has a separate palpable structure which could be colon full of stool. His abdomen could be further evaluated with unenhanced CT when he is deemed to be stable. #4. Chronic GERD. Symptoms have been well controlled with famotidine and he is presently on pantoprazole infusion which he does not need. #5. Chronic constipation.  Other problems include pneumonia acute on chronic kidney disease advanced COPD, aortic aneurysm as well as gait disorder.   Recommendations; Consider abdominopelvic CT without IV contrast when stable. Transition to oral PPI. Will continue to monitor H&H while he is hospitalized. Patient will be  reevaluated in a.m.   LOS: 2 days   REHMAN,NAJEEB U  02/22/2014, 12:24 PM

## 2014-02-22 NOTE — Consult Note (Signed)
[facility] Consultation Oncology  Name: [name]      MRN: [id number]    Location: A319/A319-01  Date: 02/22/2014 Time:4:58 PM   REFERRING PHYSICIAN:  ??? Referral order not placed???  REASON FOR CONSULT:  ???Referral order not placed??? Suspect it is for anemia   DIAGNOSIS:  Anemia of renal disease complicated with anemia of chronic GI blood loss  HISTORY OF PRESENT ILLNESS:   [name] is a 81 yo white man who is well known to the [facility] where he is treated for anemia of chronic renal disease with ESA therapy at the renal dose of 0.75 mcg/kg, due for next injection on 02/23/2014 presenting to the ED on 02/20/2014 with abdominal pain and weakness.  He was found to have an elevated troponin and questionable aspiration pneumonia.    I personally reviewed and went over laboratory results with the patient.  The results are noted within this dictation.  I personally reviewed and went over radiographic studies with the patient.  The results are noted within this dictation.    Chart reviewed.  He report that he feels "terrible."  I cannot say that the interventions we are ordering will change that feeling much for him, but hopefully with iron, he may feel a little better.  All questions are answered.  Hematologically, he denies any complaints with negative ROS questioning.   PAST MEDICAL HISTORY:   Past Medical History  Diagnosis Date  . Gout   . Stroke 1993, 1995    chronic balance issues  . Hyperlipidemia   . Emphysema   . Hypertension   . CAD (coronary artery disease)     stent 2006  . Seizure disorder   . GERD (gastroesophageal reflux disease)   . Cancer   . Asthma   . Syncope Jan. 1, 2014  . AAA (abdominal aortic aneurysm)   . Sinus node dysfunction     s/p PPM implant April 2015 (ST Jude)  . Pneumonia   . Myocardial infarction   . CHF (congestive heart failure)   . Seizures   . Renal disorder   . CKD (chronic kidney disease) stage 4, GFR 15-29  ml/min 04/17/2013    ALLERGIES: Allergies  Allergen Reactions  . Penicillins Itching      MEDICATIONS: I have reviewed the patient's current medications.     PAST SURGICAL HISTORY Past Surgical History  Procedure Laterality Date  . Cardiac catheterization  08/23/2004    bare metal stenting of the proximal circumflex  . Colonoscopy      X 2  . Colonoscopy  11/01/2011    Procedure: COLONOSCOPY;  Surgeon: Rogene Houston, MD;  [facility];  Service: Endoscopy;  Laterality: N/A;  100  . Eye surgery    . Nose surgery    . Pacemaker insertion  04-29-13    STJ Assurity dual chamber pacemaker implanted by Dr Rayann Heman for syncope and sinus node dysfunction  . Esophagogastroduodenoscopy N/A 12/16/2013    Procedure: ESOPHAGOGASTRODUODENOSCOPY (EGD);  Surgeon: Rogene Houston, MD;  [facility];  Service: Endoscopy;  Laterality: N/A;  . Permanent pacemaker insertion N/A 04/29/2013    Procedure: PERMANENT PACEMAKER INSERTION;  Surgeon: Coralyn Mark, MD;  Location: [place] CATH [facility];  Service: Cardiovascular;  Laterality: N/A;  . Tongue biopsy Right 12/2013    FAMILY HISTORY: Family History  Problem Relation Age of Onset  . Emphysema Brother   . Heart disease Sister   . Cancer Brother  lung  . Diabetes Brother   . Cancer Brother     throat  . Diabetes Brother   . Rheum arthritis Mother   . Arthritis Mother   . Rheum arthritis Father   . Colon cancer Neg Hx   . Diabetes    . Stroke Mother   . Heart attack    . Heart attack Sister     SOCIAL HISTORY:  reports that he quit smoking about 24 years ago. His smoking use included Cigarettes. He has a 15 pack-year smoking history. He has never used smokeless tobacco. He reports that he does not drink alcohol or use illicit drugs.  PERFORMANCE STATUS: The patient's performance status is 3 - Symptomatic, >50% confined to bed  PHYSICAL EXAM: Most Recent Vital Signs: Blood pressure 102/53, pulse 87, temperature 98 F  (36.7 C), temperature source Oral, resp. rate 16, height _0  (1.778 m), weight 168 lb (76.204 kg), SpO2 97 %. General appearance: alert, cooperative, appears stated age, no distress and [place] in place for O2 delivery Head: Normocephalic, without obvious abnormality, atraumatic Eyes: negative findings: lids and lashes normal, conjunctivae and sclerae normal and corneas clear Throat: lips, mucosa, and tongue normal; teeth and gums normal Neck: supple, symmetrical, trachea midline Pulses: 2+ and symmetric Skin: Skin color, texture, turgor normal. No rashes or lesions Neurologic: Grossly normal  LABORATORY DATA:  Results for orders placed or performed during the hospital encounter of 02/20/14 (from the past 48 hour(s))  Troponin I (q 6hr x 3)     Status: Abnormal   Collection Time: 02/20/14  9:52 PM  Result Value Ref Range   Troponin I 0.27 (H) <0.031 ng/mL    Comment:        PERSISTENTLY INCREASED TROPONIN VALUES IN THE RANGE OF 0.04-0.49 ng/mL CAN BE SEEN IN:       -UNSTABLE ANGINA       -CONGESTIVE HEART FAILURE       -MYOCARDITIS       -CHEST TRAUMA       -ARRYHTHMIAS       -LATE PRESENTING MYOCARDIAL INFARCTION       -COPD   CLINICAL FOLLOW-UP RECOMMENDED.   Troponin I (q 6hr x 3)     Status: Abnormal   Collection Time: 02/21/14  4:33 AM  Result Value Ref Range   Troponin I 0.26 (H) <0.031 ng/mL    Comment:        PERSISTENTLY INCREASED TROPONIN VALUES IN THE RANGE OF 0.04-0.49 ng/mL CAN BE SEEN IN:       -UNSTABLE ANGINA       -CONGESTIVE HEART FAILURE       -MYOCARDITIS       -CHEST TRAUMA       -ARRYHTHMIAS       -LATE PRESENTING MYOCARDIAL INFARCTION       -COPD   CLINICAL FOLLOW-UP RECOMMENDED.   CBC     Status: Abnormal   Collection Time: 02/21/14  4:33 AM  Result Value Ref Range   WBC 7.1 4.0 - 10.5 K/uL   RBC 2.80 (L) 4.22 - 5.81 MIL/uL   Hemoglobin 8.8 (L) 13.0 - 17.0 g/dL   HCT 27.3 (L) 39.0 - 52.0 %   MCV 97.5 78.0 - 100.0 fL   MCH 31.4 26.0 - 34.0  pg   MCHC 32.2 30.0 - 36.0 g/dL   RDW 18.1 (H) 11.5 - 15.5 %   Platelets 69 (L) 150 - 400 K/uL    Comment: SPECIMEN CHECKED FOR  CLOTS LARGE PLATELETS PRESENT PLATELET COUNT CONFIRMED BY SMEAR   Basic metabolic panel     Status: Abnormal   Collection Time: 02/21/14  4:33 AM  Result Value Ref Range   Sodium 142 135 - 145 mmol/L   Potassium 3.9 3.5 - 5.1 mmol/L   Chloride 108 96 - 112 mmol/L   CO2 26 19 - 32 mmol/L   Glucose, Bld 97 70 - 99 mg/dL   BUN 49 (H) 6 - 23 mg/dL   Creatinine, Ser 4.67 (H) 0.50 - 1.35 mg/dL   Calcium 8.5 8.4 - 10.5 mg/dL   GFR calc non Af Amer 10 (L) >90 mL/min   GFR calc Af Amer 12 (L) >90 mL/min    Comment: (NOTE) The eGFR has been calculated using the CKD EPI equation. This calculation has not been validated in all clinical situations. eGFR's persistently <90 mL/min signify possible Chronic Kidney Disease.    Anion gap 8 5 - 15  Vitamin B12     Status: Abnormal   Collection Time: 02/21/14 12:58 PM  Result Value Ref Range   Vitamin B-12 964 (H) 211 - 911 pg/mL    Comment: Performed at [facility]  Folate     Status: None   Collection Time: 02/21/14 12:58 PM  Result Value Ref Range   Folate 5.3 ng/mL    Comment: (NOTE) Reference Ranges        Deficient:       0.4 - 3.3 ng/mL        Indeterminate:   3.4 - 5.4 ng/mL        Normal:              > 5.4 ng/mL Performed at [facility]   Iron and TIBC     Status: Abnormal   Collection Time: 02/21/14 12:58 PM  Result Value Ref Range   Iron 152 (H) 42 - 165 ug/dL   TIBC 242 215 - 435 ug/dL   Saturation Ratios 63 (H) 20 - 55 %   UIBC 90 (L) 125 - 400 ug/dL    Comment: Performed at [facility]  Ferritin     Status: None   Collection Time: 02/21/14 12:58 PM  Result Value Ref Range   Ferritin 92 22 - 322 ng/mL    Comment: Performed at [facility]  Reticulocytes     Status: Abnormal   Collection Time: 02/21/14 12:58 PM  Result Value Ref Range   Retic Ct Pct 1.1  0.4 - 3.1 %   RBC. 3.05 (L) 4.22 - 5.81 MIL/uL   Retic Count, Manual 33.6 19.0 - 186.0 K/uL  Hemoglobin and hematocrit, blood     Status: Abnormal   Collection Time: 02/21/14 12:58 PM  Result Value Ref Range   Hemoglobin 9.5 (L) 13.0 - 17.0 g/dL   HCT 29.8 (L) 39.0 - 52.0 %  Hemoglobin and hematocrit, blood     Status: Abnormal   Collection Time: 02/21/14  9:11 PM  Result Value Ref Range   Hemoglobin 9.2 (L) 13.0 - 17.0 g/dL   HCT 28.5 (L) 39.0 - 62.8 %  Basic metabolic panel     Status: Abnormal   Collection Time: 02/22/14  6:06 AM  Result Value Ref Range   Sodium 141 135 - 145 mmol/L   Potassium 4.0 3.5 - 5.1 mmol/L   Chloride 108 96 - 112 mmol/L   CO2 24 19 - 32 mmol/L   Glucose, Bld 84 70 - 99 mg/dL  BUN 46 (H) 6 - 23 mg/dL   Creatinine, Ser 4.48 (H) 0.50 - 1.35 mg/dL   Calcium 8.5 8.4 - 10.5 mg/dL   GFR calc non Af Amer 10 (L) >90 mL/min   GFR calc Af Amer 12 (L) >90 mL/min    Comment: (NOTE) The eGFR has been calculated using the CKD EPI equation. This calculation has not been validated in all clinical situations. eGFR's persistently <90 mL/min signify possible Chronic Kidney Disease.    Anion gap 9 5 - 15      RADIOGRAPHY: US Renal  02/21/2014   CLINICAL DATA:  Acute renal failure  EXAM: RENAL/URINARY TRACT ULTRASOUND COMPLETE  COMPARISON:  None.  FINDINGS: Right Kidney:  Length: 9.5 cm.  No mass or hydronephrosis.  Left Kidney:  Length: 10.0 cm.  Poorly visualized.  No mass or hydronephrosis.  Bladder:  Underdistended.  IMPRESSION: No hydronephrosis.   Electronically Signed   By: Julian Hy M.D.   On: 02/21/2014 11:27       PATHOLOGY:  None  ASSESSMENT:  1. Normocytic, normochromic anemia, multifactorial with element of anemia of renal disease and chronic GI blood loss.  Outpatient treatment consists of 0.75 mcg/kg of Aranesp every 2 weeks.  Will continue this as an inpatient. 2. Low-normal ferritin 3. Bilateral pleural effusion, R > L 4. Chronic renal  disease, Stage V, followed by nephrology. 5. Positive stool cards 6. New bibasilar pulmonary haziness, R > L  7. Thrombocytopenia, ?reactive.  Can be worked up as an outpatient.    PLAN:  1. I personally reviewed and went over laboratory results with the patient.  The results are noted within this dictation. 2. I personally reviewed and went over radiographic studies with the patient.  The results are noted within this dictation.   3. Chart reviewed 4. CBC tomorrow as ordered. 5. Aranesp 60 mcg tomorrow. 6. Ferric gluconate 250 mg today and Wednesday. 7. Folvite 1 mg daily 8. Hematology will sign off at this time.  Please do not hesitate to call if hematology needs to see the patient again while in the hospital.  Otherwise, he can follow-up as scheduled as an outpatient on 12/17 at 12:30 for lab appointment and 1:00 PM for MD appointment.  His thrombocytopenia is noted and hopefully that is reactive to his hospital issues.  If not, a bone marrow aspiration and biopsy would be indicated, but the utility of this invasive diagnostic procedure is questionable given his age, performance status, and co-morbidities.  All questions were answered. The patient knows to call the clinic with any problems, questions or concerns. We can certainly see the patient much sooner if necessary.  Patient and plan discussed with Dr. Ancil Linsey and she is in agreement with the aforementioned.   KEFALAS,THOMAS 02/22/2014     As above. Patient not interested in pursuing more aggressive evaluation or treatment of underlying anemia.  Will continue with current recommendations.  Will f/u with [place] at [facility] in next several weeks. Donald Pore MD

## 2014-02-23 ENCOUNTER — Ambulatory Visit (HOSPITAL_COMMUNITY): Payer: Medicare Other

## 2014-02-23 ENCOUNTER — Inpatient Hospital Stay (HOSPITAL_COMMUNITY): Payer: Medicare Other

## 2014-02-23 ENCOUNTER — Other Ambulatory Visit (HOSPITAL_COMMUNITY): Payer: Medicare Other

## 2014-02-23 LAB — BASIC METABOLIC PANEL
ANION GAP: 9 (ref 5–15)
BUN: 47 mg/dL — ABNORMAL HIGH (ref 6–23)
CO2: 25 mmol/L (ref 19–32)
Calcium: 8.4 mg/dL (ref 8.4–10.5)
Chloride: 108 mmol/L (ref 96–112)
Creatinine, Ser: 4.11 mg/dL — ABNORMAL HIGH (ref 0.50–1.35)
GFR calc Af Amer: 13 mL/min — ABNORMAL LOW (ref >90)
GFR, EST NON AFRICAN AMERICAN: 12 mL/min — AB (ref >90)
Glucose, Bld: 88 mg/dL (ref 70–99)
POTASSIUM: 3.9 mmol/L (ref 3.5–5.1)
SODIUM: 142 mmol/L (ref 135–145)

## 2014-02-23 LAB — CBC
HEMATOCRIT: 27.9 % — AB (ref 39.0–52.0)
HEMOGLOBIN: 9.2 g/dL — AB (ref 13.0–17.0)
MCH: 31.8 pg (ref 26.0–34.0)
MCHC: 33 g/dL (ref 30.0–36.0)
MCV: 96.5 fL (ref 78.0–100.0)
Platelets: 62 10*3/uL — ABNORMAL LOW (ref 150–400)
RBC: 2.89 MIL/uL — ABNORMAL LOW (ref 4.22–5.81)
RDW: 19.9 % — AB (ref 11.5–15.5)
WBC: 6.7 10*3/uL (ref 4.0–10.5)

## 2014-02-23 LAB — PHOSPHORUS: PHOSPHORUS: 4.6 mg/dL (ref 2.3–4.6)

## 2014-02-23 NOTE — Care Management Note (Unsigned)
    Page 1 of 1   02/23/2014     3:40:59 PM CARE MANAGEMENT NOTE 02/23/2014  Patient:  CHANDLOR, NOECKER   Account Number:  [id number]  Date Initiated:  02/23/2014  Documentation initiated by:  Vladimir Creeks  Subjective/Objective Assessment:   Pt admitted with ARF, possible aspiration PNA. Pt is from home with family staying at night and a sitter in the daytime. Family is taking turns staying, but they are not getting any sleep when they stay, and are trying to do errands and     Action/Plan:   assist in the daytime also, so are worn out with this care that they want pt to go to SNF again and pt has agreed to let [facility] fax info out to facilities, for when pt ready for transfer   Anticipated DC Date:  02/25/2014   Anticipated DC Plan:  [place]  In-house referral  Clinical Social Worker      DC Planning Services  CM consult      Choice offered to / List presented to:             Status of service:  In process, will continue to follow Medicare Important Message given?   (If response is "NO", the following Medicare IM given date fields will be blank) Date Medicare IM given:   Medicare IM given by:   Date Additional Medicare IM given:   Additional Medicare IM given by:    Discharge Disposition:    Per UR Regulation:  Reviewed for med. necessity/level of care/duration of stay  If discussed at [place] of Stay Meetings, dates discussed:    Comments:  02/23/14 [place] RN/CM

## 2014-02-23 NOTE — Progress Notes (Signed)
  Subjective:  Patient complains of nausea. He does not have any appetite. He did not eat his lunch. He also complains of shortness of breath and cough without expectoration. He denies abdominal pain. He also denies melena or rectal bleeding.    Objective: Blood pressure 107/62, pulse 85, temperature 98.2 F (36.8 C), temperature source Oral, resp. rate 16, height 5\' 10"  (1.778 m), weight 168 lb (76.204 kg), SpO2 96 %. Patient is alert and does not appear to be in any distress.. Abdomen is symmetrical and soft without tenderness. Liver edge easily palpable laterally but 8-10 cm below right costal margin with a separate structure which may be a gallbladder. No LE edema or clubbing noted.  Labs/studies Results:   Recent Labs  02/21/14 0433 02/21/14 1258 02/21/14 2111 02/23/14 0605  WBC 7.1  --   --  6.7  HGB 8.8* 9.5* 9.2* 9.2*  HCT 27.3* 29.8* 28.5* 27.9*  PLT 69*  --   --  62*    BMET   Recent Labs  02/21/14 0433 02/22/14 0606 02/23/14 0605  NA 142 141 142  K 3.9 4.0 3.9  CL 108 108 108  CO2 26 24 25   GLUCOSE 97 84 88  BUN 49* 46* 47*  CREATININE 4.67* 4.48* 4.11*  CALCIUM 8.5 8.5 8.4     Assessment:  #1. Chronic GI bleed. No evidence of overt bleeding and H&H is stable. #2. Nausea. This symptom could be secondary to acute on chronic renal failure. #3. Anemia multifactorial including blood loss from GI tract. #4. Hepatic ptosis and ? Distended gallbladder. #5. Possible pneumonia in a patient with a history of COPD/emphysema. He also has pleural effusions.  Recommendations  CBC and LFTs in am. Possible upper abdominal ultrasound in a.m.

## 2014-02-23 NOTE — Evaluation (Signed)
Physical Therapy Evaluation Patient Details Name: [name] MRN: [id number] DOB: Aug 02, 1921 Today's Date: 02/23/2014   History of Present Illness  [name] is a 81 y.o. male with multiple medical problems, recently discharged from SNF to home comes to ED for generalized weakness for a few weeks associated with nausea, and one episode of vomiting yesterday. On arrival to ED, he was found to have questionable aspiration pneumonia on the CXR.  His lab work revealed elevated troponins, anemia and thrombocytopenia and he was found to be in acute renal failure. He had extensive work up int he past with an upper EGD for the evaluation of anemia including a hematology follow up . He continues to have black stools but no blood. He reports having a colonoscopy 3 years ago by Dr Laural Golden. He denies fevers or chills . He denies any chest pain or dizziness or palpitations. He has occasional productive cough and exertional sob. He was referred to medical service for admission for the management of the aspiration pneumonia  Clinical Impression  Pt is a 81 year old male who presents to PT for assessment of functional mobility skills.  Pt son present for evaluation, and reports pt has been in hospital and transferred to [facility] in October 2015 and December 2015.  Family reports increasing weakness since leaving rehab center, and was unable to participate in [place] services.  During evaluation, pt reports increased weakness and did not want to participate in functional mobility assessment.  With encouragement, pt agreed to transfer to EOB with min guard/min assist and transfer to stand with min guard and use of RW.  Pt able to maintain standing to 10 seconds, and side step 2 steps.  Pt refused gait assessment today.  Recommend continued PT to address d/c recommendations, SNF vs. HHPT, after gait assessment.  No DME recommendations at this time.     Follow Up Recommendations SNF;Home health PT (Unsure as pt refused gait,  though did require limited assist with bed mobility and transfers.  Will assess gait as able and update recommendations. )    Equipment Recommendations  None recommended by PT    Recommendations for Other Services OT consult     Precautions / Restrictions Precautions Precautions: Fall Restrictions Weight Bearing Restrictions: No      Mobility  Bed Mobility Overal bed mobility: Needs Assistance Bed Mobility: Supine to Sit;Sit to Supine;Sidelying to Sit   Sidelying to sit: Modified independent (Device/Increase time) (use of handrail) Supine to sit: Min assist (for trunk) Sit to supine: Supervision      Transfers Overall transfer level: Needs assistance Equipment used: Rolling walker (2 wheeled) Transfers: Sit to/from Stand Sit to Stand: Min guard            Ambulation/Gait             General Gait Details: Pt refused ambulation skills secondary to fatigue "weak" today; pt was able to side step Rt for 2 steps to move to [facility]      Balance Overall balance assessment: Needs assistance Sitting-balance support: Feet supported;No upper extremity supported Sitting balance-Leahy Scale: Good     Standing balance support: Bilateral upper extremity supported;During functional activity Standing balance-Leahy Scale: Fair                               Pertinent Vitals/Pain Pain Assessment: No/denies pain    Home Living Family/patient expects to be discharged to:: Unsure Living Arrangements:  Alone (Aide 8-6 7/wk, and family overnight) Available Help at Discharge: Family;Personal care attendant;Available 24 hours/day Type of Home: House Home Access: Stairs to enter Entrance Stairs-Rails: None Entrance Stairs-Number of Steps: 2 Home Layout: One level Home Equipment: Walker - 2 wheels;Wheelchair - Sport and exercise psychologist Comments: Risk analyst    Prior Function Level of Independence: Needs assistance   Gait / Transfers Assistance Needed: Per  family, pt has required increasing assist with bed mobility and transfers secondary to fatigue.   ADL's / Homemaking Assistance Needed: Per family, pt sponge baths with assist.  Family completes all household duties.         Hand Dominance   Dominant Hand: Right    Extremity/Trunk Assessment               Lower Extremity Assessment: Generalized weakness         Communication   Communication: No difficulties  Cognition Arousal/Alertness: Lethargic Behavior During Therapy: WFL for tasks assessed/performed Overall Cognitive Status: Within Functional Limits for tasks assessed                       Assessment/Plan    PT Assessment Patient needs continued PT services  PT Diagnosis Difficulty walking;Generalized weakness   PT Problem List Decreased strength;Decreased activity tolerance;Decreased mobility  PT Treatment Interventions Therapeutic exercise;Gait training;Stair training;Functional mobility training;Therapeutic activities;Patient/family education;Neuromuscular re-education   PT Goals (Current goals can be found in the Care Plan section) Acute Rehab PT Goals Patient Stated Goal: get stronger PT Goal Formulation: With patient/family Time For Goal Achievement: 03/09/14 Potential to Achieve Goals: Good    Frequency Min 3X/week    End of Session Equipment Utilized During Treatment: Gait belt Activity Tolerance: Patient limited by fatigue Patient left: in bed;with call bell/phone within reach;with bed alarm set;with family/visitor present           Time: 5361-4431 PT Time Calculation (min) (ACUTE ONLY): 23 min   Charges:   PT Evaluation $Initial PT Evaluation Tier I: 1 Procedure     Lonna Cobb, DPT [phone]  02/23/2014, 5:16 PM

## 2014-02-23 NOTE — Procedures (Signed)
Objective Swallowing Evaluation: Modified Barium Swallowing Study  Patient Details  Name: [name] MRN: [id number] Date of Birth: June 24, 1921  Today's Date: 02/23/2014 Time: SLP Start Time (ACUTE ONLY): 1215-SLP Stop Time (ACUTE ONLY): 1236 SLP Time Calculation (min) (ACUTE ONLY): 21 min  Past Medical History:  Past Medical History  Diagnosis Date  . Gout   . Stroke 1993, 1995    chronic balance issues  . Hyperlipidemia   . Emphysema   . Hypertension   . CAD (coronary artery disease)     stent 2006  . Seizure disorder   . GERD (gastroesophageal reflux disease)   . Cancer   . Asthma   . Syncope Jan. 1, 2014  . AAA (abdominal aortic aneurysm)   . Sinus node dysfunction     s/p PPM implant April 2015 (ST Jude)  . Pneumonia   . Myocardial infarction   . CHF (congestive heart failure)   . Seizures   . Renal disorder   . CKD (chronic kidney disease) stage 4, GFR 15-29 ml/min 04/17/2013   Past Surgical History:  Past Surgical History  Procedure Laterality Date  . Cardiac catheterization  08/23/2004    bare metal stenting of the proximal circumflex  . Colonoscopy      X 2  . Colonoscopy  11/01/2011    Procedure: COLONOSCOPY;  Surgeon: Rogene Houston, MD;  Location: [facility];  Service: Endoscopy;  Laterality: N/A;  100  . Eye surgery    . Nose surgery    . Pacemaker insertion  04-29-13    STJ Assurity dual chamber pacemaker implanted by Dr Rayann Heman for syncope and sinus node dysfunction  . Esophagogastroduodenoscopy N/A 12/16/2013    Procedure: ESOPHAGOGASTRODUODENOSCOPY (EGD);  Surgeon: Rogene Houston, MD;  Location: [facility];  Service: Endoscopy;  Laterality: N/A;  . Permanent pacemaker insertion N/A 04/29/2013    Procedure: PERMANENT PACEMAKER INSERTION;  Surgeon: Coralyn Mark, MD;  Location: [facility] CATH LAB;  Service: Cardiovascular;  Laterality: N/A;  . Tongue biopsy Right 12/2013   HPI:  HPI: Patient is 81 year old Caucasian male with multiple medical problems  who was at this facility in December 2015 when he was evaluated for heme positive stool and anemia. He was discharged to [facility] for rehabilitation. He improved and was able to come home just before Christmas. Patient did well for 5-6 weeks. Over the last 2-3 weeks he has developed progressive weakness decrease in appetite. He had lost few pounds. He was brought to emergency room 2 days ago. He was noted to have heme-positive stool. Also felt to have pneumonia and therefore hospitalized. Patient was also felt to have acute on chronic renal failure secondary to dehydration and has been evaluated by Dr. Hinda Lenis of nephrology service. Patient states he does not feel well. He has dyspnea with minimal exertion. He become short of breath even when he eats his meals. He is chronically on nasal O2. He denies nausea vomiting heartburn or dysphagia. Back in December 2015 was hospitalized for anemia and occult GI bleed. He underwent esophagogastroduodenoscopy and he was noted to have fresh blood and hypopharynx and in upper airway. No bleeding lesion was identified in upper GI tract. He had unremarkable chest CT angiogram and subsequent was seen by Dr. Benjamine Mola and no lesion was found in his of pharynx to account for blood loss. Pt endorses difficulty swallowing at times. SLP asked to evaluate swallow.  No Data Recorded  Assessment / Plan / Recommendation CHL IP CLINICAL  IMPRESSIONS 02/23/2014  Dysphagia Diagnosis WFL;Suspected primary esophageal dysphagia  Clinical impression Oropharyngeal swallow is essentially WNL for age across textures and consistencies; mild premature spillage with thin liquids into valleculae. No penetration, aspiration, or residuals observed (assessed with thin, puree, regular texture, and pill with thin). Barium tablet was retained in distal esophagus near the GE junction for ~4 minutes. Thin liquid bypassed the pill and emptied into stomach, but puree backed up behind pill all the way to  thoracic esophagus. Suspect age related esophageal dysmotility with delayed emptying, however no radiologist present to confirm. Recommend self regulated "regular" textures (pt prefers soft) with thin liquids with strict reflux precautions due to delayed emptying of esophagus with puree and solids. No further SLP services indicated at this time.       CHL IP TREATMENT RECOMMENDATION 02/23/2014  Treatment Plan Recommendations No treatment recommended at this time     CHL IP DIET RECOMMENDATION 02/23/2014  Diet Recommendations Dysphagia 3 (Mechanical Soft);Thin liquid  Liquid Administration via Cup;Straw  Medication Administration Whole meds with liquid  Compensations Slow rate;Small sips/bites  Postural Changes and/or Swallow Maneuvers Seated upright 90 degrees;Upright 30-60 min after meal     CHL IP OTHER RECOMMENDATIONS 02/23/2014  Recommended Consults (None)  Oral Care Recommendations Oral care BID  Other Recommendations Clarify dietary restrictions     CHL IP FOLLOW UP RECOMMENDATIONS 02/23/2014  Follow up Recommendations 24 hour supervision/assistance     CHL IP FREQUENCY AND DURATION 02/22/2014  Speech Therapy Frequency (ACUTE ONLY) min 2x/week  Treatment Duration 1 week     Pertinent Vitals/Pain VSS    SLP Swallow Goals No flowsheet data found.  No flowsheet data found.    CHL IP REASON FOR REFERRAL 02/23/2014  Reason for Referral Objectively evaluate swallowing function     CHL IP ORAL PHASE 02/23/2014  Lips (None)  Tongue (None)  Mucous membranes (None)  Nutritional status (None)  Other (None)  Oxygen therapy (None)  Oral Phase WFL  Oral - Pudding Teaspoon (None)  Oral - Pudding Cup (None)  Oral - Honey Teaspoon (None)  Oral - Honey Cup (None)  Oral - Honey Syringe (None)  Oral - Nectar Teaspoon (None)  Oral - Nectar Cup (None)  Oral - Nectar Straw (None)  Oral - Nectar Syringe (None)  Oral - Ice Chips (None)  Oral - Thin Teaspoon (None)  Oral - Thin Cup (None)  Oral  - Thin Straw (None)  Oral - Thin Syringe (None)  Oral - Puree (None)  Oral - Mechanical Soft (None)  Oral - Regular (None)  Oral - Multi-consistency (None)  Oral - Pill (None)  Oral Phase - Comment (None)      CHL IP PHARYNGEAL PHASE 02/23/2014  Pharyngeal Phase WFL  Pharyngeal - Pudding Teaspoon (None)  Penetration/Aspiration details (pudding teaspoon) (None)  Pharyngeal - Pudding Cup (None)  Penetration/Aspiration details (pudding cup) (None)  Pharyngeal - Honey Teaspoon (None)  Penetration/Aspiration details (honey teaspoon) (None)  Pharyngeal - Honey Cup (None)  Penetration/Aspiration details (honey cup) (None)  Pharyngeal - Honey Syringe (None)  Penetration/Aspiration details (honey syringe) (None)  Pharyngeal - Nectar Teaspoon (None)  Penetration/Aspiration details (nectar teaspoon) (None)  Pharyngeal - Nectar Cup (None)  Penetration/Aspiration details (nectar cup) (None)  Pharyngeal - Nectar Straw (None)  Penetration/Aspiration details (nectar straw) (None)  Pharyngeal - Nectar Syringe (None)  Penetration/Aspiration details (nectar syringe) (None)  Pharyngeal - Ice Chips (None)  Penetration/Aspiration details (ice chips) (None)  Pharyngeal - Thin Teaspoon (None)  Penetration/Aspiration details (thin teaspoon) (  None)  Pharyngeal - Thin Cup (None)  Penetration/Aspiration details (thin cup) (None)  Pharyngeal - Thin Straw (None)  Penetration/Aspiration details (thin straw) (None)  Pharyngeal - Thin Syringe (None)  Penetration/Aspiration details (thin syringe') (None)  Pharyngeal - Puree (None)  Penetration/Aspiration details (puree) (None)  Pharyngeal - Mechanical Soft (None)  Penetration/Aspiration details (mechanical soft) (None)  Pharyngeal - Regular (None)  Penetration/Aspiration details (regular) (None)  Pharyngeal - Multi-consistency (None)  Penetration/Aspiration details (multi-consistency) (None)  Pharyngeal - Pill (None)  Penetration/Aspiration details  (pill) (None)  Pharyngeal Comment (None)     CHL IP CERVICAL ESOPHAGEAL PHASE 02/23/2014  Cervical Esophageal Phase WFL  Pudding Teaspoon (None)  Pudding Cup (None)  Honey Teaspoon (None)  Honey Cup (None)  Honey Syringe (None)  Nectar Teaspoon (None)  Nectar Cup (None)  Nectar Straw (None)  Nectar Syringe (None)  Thin Teaspoon (None)  Thin Cup (None)  Thin Straw (None)  Thin Syringe (None)  Cervical Esophageal Comment (None)    No flowsheet data found.        Thank you,  Genene Churn, [place]  [place] 02/23/2014, 5:35 PM

## 2014-02-23 NOTE — Clinical Social Work Placement (Signed)
Clinical Social Work Department CLINICAL SOCIAL WORK PLACEMENT NOTE 02/23/2014  Patient:  JONAS, GOH  Account Number:  [id number] Admit date:  02/20/2014  Clinical Social Worker:  Benay Pike, LCSW  Date/time:  02/23/2014 02:07 PM  Clinical Social Work is seeking post-discharge placement for this patient at the following level of care:   [place]   (*CSW will update this form in Epic as items are completed)   02/23/2014  Patient/family provided with [place] Department of Clinical Social Work's list of facilities offering this level of care within the geographic area requested by the patient (or if unable, by the patient's family).  02/23/2014  Patient/family informed of their freedom to choose among providers that offer the needed level of care, that participate in Medicare, Medicaid or managed care program needed by the patient, have an available bed and are willing to accept the patient.  02/23/2014  Patient/family informed of [facility]' ownership interest in [facility], as well as of the fact that they are under no obligation to receive care at this facility.  PASARR submitted to EDS on  PASARR number received on   FL2 transmitted to all facilities in geographic area requested by pt/family on  02/23/2014 FL2 transmitted to all facilities within larger geographic area on   Patient informed that his/her managed care company has contracts with or will negotiate with  certain facilities, including the following:     Patient/family informed of bed offers received:   Patient chooses bed at  Physician recommends and patient chooses bed at    Patient to be transferred to  on   Patient to be transferred to facility by  Patient and family notified of transfer on  Name of family member notified:    The following physician request were entered in Epic:   Additional Comments: Pt has existing pasarr.  Benay Pike, [place]

## 2014-02-23 NOTE — Progress Notes (Addendum)
[name]  MRN: [id number]  DOB/AGE: December 23, 1921 81 y.o.  Primary Care Physician:FAGAN,ROY, MD  Admit date: 02/20/2014  Chief Complaint:  Chief Complaint  Patient presents with  . Abdominal Pain  . Weakness    x 3 week, decreased intake    S-Pt presented on  02/20/2014 with  Chief Complaint  Patient presents with  . Abdominal Pain  . Weakness    x 3 week, decreased intake  .    Pt today feels better.      Meds . sodium chloride   Intravenous Once  . ceFEPime (MAXIPIME) IV  1 g Intravenous Q24H  . darbepoetin (ARANESP) injection - NON-DIALYSIS  60 mcg Subcutaneous Once  . donepezil  10 mg Oral QHS  . [START ON 02/24/2014] ferric gluconate (FERRLECIT/NULECIT) IV  250 mg Intravenous Once  . folic acid  1 mg Oral Daily  . furosemide  80 mg Intravenous BID  . metoprolol tartrate  12.5 mg Oral Daily  . metronidazole  500 mg Intravenous Q8H  . Oxcarbazepine  300 mg Oral BID  . pantoprazole  40 mg Oral BID AC  . sodium chloride  3 mL Intravenous Q12H  . tamsulosin  0.4 mg Oral QHS     Physical Exam: Vital signs in last 24 hours: Temp:  [97.9 F (36.6 C)-98.1 F (36.7 C)] 97.9 F (36.6 C) (02/09 0511) Pulse Rate:  [81-86] 86 (02/09 0511) Resp:  [16] 16 (02/09 0511) BP: (120-123)/(63) 123/63 mmHg (02/09 0511) SpO2:  [97 %-98 %] 97 % (02/09 0511) Weight change:  Last BM Date: 02/22/14  Intake/Output from previous day: 02/08 0701 - 02/09 0700 In: -  Out: 2225 [Urine:2225]     Physical Exam: General- pt is awake,alert, oriented to time place and person Resp- No acute REsp distress, CTA B/L NO Rhonchi CVS- S1S2 regular in rate and rhythm GIT- BS+, soft, NT, ND EXT- NO LE Edema, Cyanosis   Lab Results: CBC  Recent Labs  02/21/14 0433  02/21/14 2111 02/23/14 0605  WBC 7.1  --   --  6.7  HGB 8.8*  < > 9.2* 9.2*  HCT 27.3*  < > 28.5* 27.9*  PLT 69*  --   --  62*  < > = values in this interval not displayed.  BMET  Recent Labs  02/22/14 0606  02/23/14 0605  NA 141 142  K 4.0 3.9  CL 108 108  CO2 24 25  GLUCOSE 84 88  BUN 46* 47*  CREATININE 4.48* 4.11*  CALCIUM 8.5 8.4   Trend Creat 2016  4.93=>4.48=>4.11 2015  5.29=> 4.57=>4.31=>4.47           2.1--2.8 ( Baseline before AKI) 2014  1.4--1.7 2013  1.3--1.4 2012   1.3 2011   1.6--1.7 2010    1.6--1.9 2009   1,6    Lab Results  Component Value Date   CALCIUM 8.4 02/23/2014   PHOS 4.6 02/23/2014          Impression: 1)Renal  AKI secondary to Prerenal/ATN/Interstitial                 AKi improving                CKD stage 4 .               CKD since 2009               CKD secondary to HTN/Age associated decline  Progression of CKD marked with Multiple AKI                Proteinura Absent .                Autoimmune work up Negative                 ANA/ANCA -negative                  Complements not low  2)HTN Medication- On Diuretics On Beta blockers    3)Anemia HGb  at goal (9--11) Received [facility] Primary team and GI folowing   4)CKD Mineral-Bone Disorder Phosphorus at goal. Calcium at goal.  5)CAD--Admitted with high [place] Primary MD following  6)Electrolytes Normokalemic Normonatremic   7)Acid base Co2 at goal     Plan:  Will continue current care     [place] S 02/23/2014, 9:06 AM

## 2014-02-23 NOTE — Clinical Social Work Psychosocial (Signed)
Clinical Social Work Department BRIEF PSYCHOSOCIAL ASSESSMENT 02/23/2014  Patient:  [name], [name]     Account Number:  [id number]     Admit date:  02/20/2014  Clinical Social Worker:  Wyatt Haste  Date/Time:  02/23/2014 02:12 PM  Referred by:  CSW  Date Referred:  02/23/2014 Referred for  SNF Placement   Other Referral:   Interview type:  Patient Other interview type:    PSYCHOSOCIAL DATA Living Status:  OTHER Admitted from facility:   Level of care:   Primary support name:  Frank/Kathryn/Geraldine Primary support relationship to patient:  CHILD, ADULT Degree of support available:   supportive    CURRENT CONCERNS Current Concerns  Post-Acute Placement   Other Concerns:    SOCIAL WORK ASSESSMENT / PLAN CSW met with pt at bedside. Pt alert and oriented and known to CSW from previous admissions. Admitted with possible pneumonia. Pt states that he has been home from [facility] for about a month now. He said he has been doing "alright" at home since then. Family has arranged a sitter to be with pt from 8am-6pm. Family then stays with him overnight. Pt admits that he is feeling weaker again and reports, "I can hardly walk to bathroom." Although pt agrees that SNF would be beneficial, he is not sure that he wants to return.  CSW provided brief support. Pt feels he would be more likely to go to SNF if his children feel it is necessary. CSW asked pt about calling his children and he said that he wanted to discuss with them himself. He agrees for CSW to send referral to [facility] and [facility] only and he requests a private room. He is aware of Medicare coverage/criteria.   Assessment/plan status:  Psychosocial Support/Ongoing Assessment of Needs Other assessment/ plan:   Information/referral to community resources:   SNF list    PATIENT'S/FAMILY'S RESPONSE TO PLAN OF CARE: CSW will initiate bed search and discuss with pt further.       Benay Pike,  [place]

## 2014-02-23 NOTE — Progress Notes (Signed)
Subjective: [name] notes cough but no purulent sputum production. He has not had fever. He complains of nausea this morning. Has been evaluated by gastroenterology and hematology. Consult appreciated. He has been seen by nephrology who would like to treat his renal failure with fluids and Lasix.  Objective: Vital signs in last 24 hours: Filed Vitals:   02/22/14 0417 02/22/14 0900 02/22/14 2118 02/23/14 0511  BP: 122/74 102/53 120/63 123/63  Pulse: 82 87 81 86  Temp: 98.8 F (37.1 C) 98 F (36.7 C) 98.1 F (36.7 C) 97.9 F (36.6 C)  TempSrc: Oral Oral Oral Oral  Resp: 16  16 16   Height:      Weight:      SpO2: 96% 97% 98% 97%   Weight change:   Intake/Output Summary (Last 24 hours) at 02/23/14 0718 Last data filed at 02/23/14 0500  Gross per 24 hour  Intake      0 ml  Output   1925 ml  Net  -1925 ml    Physical Exam: Alert. No distress. Lungs clear. Heart regular with no murmurs. Abdomen is soft and nontender with no palpable organomegaly. Extremities reveal no edema.  Lab Results:   No results found for this or any previous visit (from the past 24 hour(s)).   ABGS No results for input(s): PHART, PO2ART, TCO2, HCO3 in the last 72 hours.  Invalid input(s): PCO2 CULTURES Recent Results (from the past 240 hour(s))  Culture, blood (routine x 2)     Status: None (Preliminary result)   Collection Time: 02/20/14  1:31 PM  Result Value Ref Range Status   Specimen Description BLOOD LEFT ARM DRAWN BY RN  Final   Special Requests BOTTLES DRAWN AEROBIC AND ANAEROBIC 6CC BOTTLES  Final   Culture NO GROWTH 2 DAYS  Final   Report Status PENDING  Incomplete  Culture, blood (routine x 2)     Status: None (Preliminary result)   Collection Time: 02/20/14  1:42 PM  Result Value Ref Range Status   Specimen Description BLOOD LEFT ARM  Final   Special Requests   Final    BOTTLES DRAWN AEROBIC AND ANAEROBIC 8CC EACH BOTTLE   Culture NO GROWTH 2 DAYS  Final   Report Status PENDING   Incomplete   Studies/Results: US Renal  02/21/2014   CLINICAL DATA:  Acute renal failure  EXAM: RENAL/URINARY TRACT ULTRASOUND COMPLETE  COMPARISON:  None.  FINDINGS: Right Kidney:  Length: 9.5 cm.  No mass or hydronephrosis.  Left Kidney:  Length: 10.0 cm.  Poorly visualized.  No mass or hydronephrosis.  Bladder:  Underdistended.  IMPRESSION: No hydronephrosis.   Electronically Signed   By: Julian Hy M.D.   On: 02/21/2014 11:27   Micro Results: Recent Results (from the past 240 hour(s))  Culture, blood (routine x 2)     Status: None (Preliminary result)   Collection Time: 02/20/14  1:31 PM  Result Value Ref Range Status   Specimen Description BLOOD LEFT ARM DRAWN BY RN  Final   Special Requests BOTTLES DRAWN AEROBIC AND ANAEROBIC 6CC BOTTLES  Final   Culture NO GROWTH 2 DAYS  Final   Report Status PENDING  Incomplete  Culture, blood (routine x 2)     Status: None (Preliminary result)   Collection Time: 02/20/14  1:42 PM  Result Value Ref Range Status   Specimen Description BLOOD LEFT ARM  Final   Special Requests   Final    BOTTLES DRAWN AEROBIC AND ANAEROBIC [place]  BOTTLE   Culture NO GROWTH 2 DAYS  Final   Report Status PENDING  Incomplete   Studies/Results: US Renal  02/21/2014   CLINICAL DATA:  Acute renal failure  EXAM: RENAL/URINARY TRACT ULTRASOUND COMPLETE  COMPARISON:  None.  FINDINGS: Right Kidney:  Length: 9.5 cm.  No mass or hydronephrosis.  Left Kidney:  Length: 10.0 cm.  Poorly visualized.  No mass or hydronephrosis.  Bladder:  Underdistended.  IMPRESSION: No hydronephrosis.   Electronically Signed   By: Julian Hy M.D.   On: 02/21/2014 11:27   Medications:  I have reviewed the patient's current medications Scheduled Meds: . sodium chloride   Intravenous Once  . ceFEPime (MAXIPIME) IV  1 g Intravenous Q24H  . darbepoetin (ARANESP) injection - NON-DIALYSIS  60 mcg Subcutaneous Once  . donepezil  10 mg Oral QHS  . [START ON 02/24/2014] ferric gluconate  (FERRLECIT/NULECIT) IV  250 mg Intravenous Once  . folic acid  1 mg Oral Daily  . furosemide  80 mg Intravenous BID  . metoprolol tartrate  12.5 mg Oral Daily  . metronidazole  500 mg Intravenous Q8H  . Oxcarbazepine  300 mg Oral BID  . pantoprazole  40 mg Oral BID AC  . sodium chloride  3 mL Intravenous Q12H  . tamsulosin  0.4 mg Oral QHS   Continuous Infusions: . sodium chloride 75 mL/hr at 02/22/14 1450   PRN Meds:.nitroGLYCERIN, ondansetron **OR** ondansetron (ZOFRAN) IV   Assessment/Plan: #1. Possible pneumonia. Bilateral pleural effusions. Continue IV antibiotic therapy. #2. Chronic kidney disease. Metabolic profile pending. Continue treatment per nephrology. #3. Anemia. He is receiving iron. He will receive Aranesp. #4. Chronic GI bleeding. No intervention at this point is planned. #5. UTI. Blood cultures negative. Urine culture pending. #6. Chronic diastolic heart failure. No evidence of volume overload at this point. Active Problems:   Anemia   Acute renal failure     LOS: 3 days   Orrin Yurkovich 02/23/2014, 7:18 AM

## 2014-02-24 ENCOUNTER — Inpatient Hospital Stay (HOSPITAL_COMMUNITY): Payer: Medicare Other

## 2014-02-24 LAB — BASIC METABOLIC PANEL
ANION GAP: 10 (ref 5–15)
BUN: 47 mg/dL — ABNORMAL HIGH (ref 6–23)
CALCIUM: 8.5 mg/dL (ref 8.4–10.5)
CO2: 25 mmol/L (ref 19–32)
Chloride: 107 mmol/L (ref 96–112)
Creatinine, Ser: 4.13 mg/dL — ABNORMAL HIGH (ref 0.50–1.35)
GFR calc Af Amer: 13 mL/min — ABNORMAL LOW (ref >90)
GFR, EST NON AFRICAN AMERICAN: 12 mL/min — AB (ref >90)
Glucose, Bld: 84 mg/dL (ref 70–99)
Potassium: 3.7 mmol/L (ref 3.5–5.1)
SODIUM: 142 mmol/L (ref 135–145)

## 2014-02-24 LAB — URINE CULTURE
Colony Count: NO GROWTH
Culture: NO GROWTH

## 2014-02-24 LAB — CBC
HCT: 27.1 % — ABNORMAL LOW (ref 39.0–52.0)
Hemoglobin: 8.8 g/dL — ABNORMAL LOW (ref 13.0–17.0)
MCH: 31.4 pg (ref 26.0–34.0)
MCHC: 32.5 g/dL (ref 30.0–36.0)
MCV: 96.8 fL (ref 78.0–100.0)
Platelets: 59 10*3/uL — ABNORMAL LOW (ref 150–400)
RBC: 2.8 MIL/uL — AB (ref 4.22–5.81)
RDW: 19.9 % — ABNORMAL HIGH (ref 11.5–15.5)
WBC: 6.7 10*3/uL (ref 4.0–10.5)

## 2014-02-24 LAB — HEPATIC FUNCTION PANEL
ALBUMIN: 3.1 g/dL — AB (ref 3.5–5.2)
ALT: 11 U/L (ref 0–53)
AST: 16 U/L (ref 0–37)
Alkaline Phosphatase: 90 U/L (ref 39–117)
BILIRUBIN TOTAL: 0.6 mg/dL (ref 0.3–1.2)
Bilirubin, Direct: 0.2 mg/dL (ref 0.0–0.5)
Indirect Bilirubin: 0.4 mg/dL (ref 0.3–0.9)
Total Protein: 5.3 g/dL — ABNORMAL LOW (ref 6.0–8.3)

## 2014-02-24 NOTE — Progress Notes (Signed)
[name]  MRN: [id number]  DOB/AGE: 06/26/1921 81 y.o.  Primary Care Physician:FAGAN,ROY, MD  Admit date: 02/20/2014  Chief Complaint:  Chief Complaint  Patient presents with  . Abdominal Pain  . Weakness    x 3 week, decreased intake    S-Pt presented on  02/20/2014 with  Chief Complaint  Patient presents with  . Abdominal Pain  . Weakness    x 3 week, decreased intake  .    Pt says " I don't feel good"    Pt says " when you are 81 , you don't feel good"       Meds . ceFEPime (MAXIPIME) IV  1 g Intravenous Q24H  . ferric gluconate (FERRLECIT/NULECIT) IV  250 mg Intravenous Once  . folic acid  1 mg Oral Daily  . furosemide  80 mg Intravenous BID  . metoprolol tartrate  12.5 mg Oral Daily  . metronidazole  500 mg Intravenous Q8H  . Oxcarbazepine  300 mg Oral BID  . pantoprazole  40 mg Oral BID AC  . sodium chloride  3 mL Intravenous Q12H  . tamsulosin  0.4 mg Oral QHS     Physical Exam: Vital signs in last 24 hours: Temp:  [98.2 F (36.8 C)-98.5 F (36.9 C)] 98.5 F (36.9 C) (02/10 0513) Pulse Rate:  [82-88] 88 (02/10 0513) Resp:  [16] 16 (02/10 0513) BP: (107-120)/(62-72) 120/72 mmHg (02/10 0513) SpO2:  [96 %-100 %] 100 % (02/10 0513) Weight change:  Last BM Date: 02/22/14  Intake/Output from previous day: 02/09 0701 - 02/10 0700 In: 6001.3 [P.O.:480; I.V.:5521.3] Out: 975 [Urine:975]     Physical Exam: General- pt is awake,alert, oriented to time place and person Resp- No acute REsp distress, CTA B/L NO Rhonchi CVS- S1S2 regular in rate and rhythm GIT- BS+, soft, NT, ND EXT- NO LE Edema, Cyanosis   Lab Results: CBC  Recent Labs  02/23/14 0605 02/24/14 0642  WBC 6.7 6.7  HGB 9.2* 8.8*  HCT 27.9* 27.1*  PLT 62* 59*    BMET  Recent Labs  02/23/14 0605 02/24/14 0642  NA 142 142  K 3.9 3.7  CL 108 107  CO2 25 25  GLUCOSE 88 84  BUN 47* 47*  CREATININE 4.11* 4.13*  CALCIUM 8.4 8.5   Trend Creat 2016   4.93=>4.48=>4.11=>4.13 2015  5.29=> 4.57=>4.31=>4.47           2.1--2.8 ( Baseline before AKI) 2014  1.4--1.7 2013  1.3--1.4 2012   1.3 2011   1.6--1.7 2010    1.6--1.9 2009   1,6    Lab Results  Component Value Date   CALCIUM 8.5 02/24/2014   PHOS 4.6 02/23/2014          Impression: 1)Renal  AKI secondary to Prerenal/ATN/Interstitial                 AKi better                Creat probably at baseline               CKD stage 4 .               CKD since 2009               CKD secondary to HTN/Age associated decline                Progression of CKD marked with Multiple AKI  Proteinura Absent .                Autoimmune work up Negative                 ANA/ANCA -negative                  Complements not low  2)HTN Medication- On Diuretics On Beta blockers    3)Anemia HGb  at goal (9--11) Received [facility] Primary team and GI folowing   4)CKD Mineral-Bone Disorder Phosphorus at goal. Calcium at goal.  5)CAD--Admitted with high [place] Primary MD following  6)Electrolytes Normokalemic Normonatremic   7)Acid base Co2 at goal     Plan:  Will continue current care.     BHUTANI,MANPREET S 02/24/2014, 9:13 AM

## 2014-02-24 NOTE — Progress Notes (Signed)
Patient feels better. According to his son and daughter he ate more than 50% of his lunch.  He denies abdominal pain or nausea. LFTs normal except albumin of 3.1 Ultrasound shows normal-sized bile duct thickened gallbladder wall without cholelithiasis and bilateral pleural effusions. Suspect gallbladder wall thickening secondary to CHF. No further GI workup unless patient develops abdominal pain or vomiting.

## 2014-02-24 NOTE — Clinical Social Work Placement (Signed)
Clinical Social Work Department CLINICAL SOCIAL WORK PLACEMENT NOTE 02/24/2014  Patient:  James, Strickland  Account Number:  [id number] Admit date:  02/20/2014  Clinical Social Worker:  Benay Pike, LCSW  Date/time:  02/23/2014 02:07 PM  Clinical Social Work is seeking post-discharge placement for this patient at the following level of care:   [employer]   (*CSW will update this form in Epic as items are completed)   02/23/2014  Patient/family provided with [facility] Department of Clinical Social Work's list of facilities offering this level of care within the geographic area requested by the patient (or if unable, by the patient's family).  02/23/2014  Patient/family informed of their freedom to choose among providers that offer the needed level of care, that participate in Medicare, Medicaid or managed care program needed by the patient, have an available bed and are willing to accept the patient.  02/23/2014  Patient/family informed of [facility]' ownership interest in [facility], as well as of the fact that they are under no obligation to receive care at this facility.  PASARR submitted to EDS on  PASARR number received on   FL2 transmitted to all facilities in geographic area requested by pt/family on  02/23/2014 FL2 transmitted to all facilities within larger geographic area on   Patient informed that his/her managed care company has contracts with or will negotiate with  certain facilities, including the following:     Patient/family informed of bed offers received:  02/24/2014 Patient chooses bed at [facility] Physician recommends and patient chooses bed at    Patient to be transferred to  on   Patient to be transferred to facility by  Patient and family notified of transfer on  Name of family member notified:    The following physician request were entered in Epic:   Additional Comments: Pt has existing pasarr.  Benay Pike,  [place]

## 2014-02-24 NOTE — Progress Notes (Addendum)
  Subjective:  Patient states he does not feel well. He denies shortness of breath but he has difficulty coughing up phlegm in his throat. He complains of nausea and poor appetite. He denies dyspnea at rest. He states he gets short of breath with minimal activity. His bowels have not moved in the last 24 hours.   Objective: Blood pressure 120/72, pulse 88, temperature 98.5 F (36.9 C), temperature source Oral, resp. rate 16, height 5\' 10"  (1.778 m), weight 168 lb (76.204 kg), SpO2 100 %. Patient is alert and appears to be in no acute distress. Abdomen is symmetrical bowel sounds are normal. On palpation abdomen is soft and nontender. Liver edge palpable below the right costal margin along with a separate structure possibly gallbladder. Mild penile and scrotal edema. No LE edema or clubbing noted.  Labs/studies Results:   Recent Labs  02/21/14 2111 02/23/14 0605 02/24/14 0642  WBC  --  6.7 6.7  HGB 9.2* 9.2* 8.8*  HCT 28.5* 27.9* 27.1*  PLT  --  62* 59*    BMET   Recent Labs  02/22/14 0606 02/23/14 0605 02/24/14 0642  NA 141 142 142  K 4.0 3.9 3.7  CL 108 108 107  CO2 24 25 25   GLUCOSE 84 88 84  BUN 46* 47* 47*  CREATININE 4.48* 4.11* 4.13*  CALCIUM 8.5 8.4 8.5     LFTs pending.  Assessment:  #1. Anemia. Hemoglobin has dropped by less than half a gram in the last 24 hours. Anemia felt to be multifactorial but primarily chronic disease anemia. #2. Chronic GI bleed. Stool was guaiac-positive on admission. EGD in December 2015 failed to show lesion in his upper GI tract but he did have blood in his airway. ENT evaluation and chest CT were negative. He could have angioedema dysplasia and small bowel. #3. Intractable nausea. This symptom may be secondary to renal disease. #4. Pneumonia. #5. Dysphagia. No abnormality noted on modified barium swallow and evaluation by speech pathologist. #6. Renal failure. Slight improvement in renal function hydration and diuretic  therapy.  Recommendations;  Proceed with upper abdominal ultrasound.

## 2014-02-24 NOTE — Progress Notes (Signed)
PT Cancellation Note  Patient Details Name: [name] MRN: [id number] DOB: 1921-12-12   Cancelled Treatment:    Reason Eval/Treat Not Completed: Fatigue/lethargy limiting ability to participate (Pt stated he was "too put out to participate")  Pt unwilling to participate with PT today, stating he was "too put out"and did not want to participate.  Pt explained risks he takes from staying in bed and benefits with participation.   Aldona Lento 02/24/2014, 1:20 PM

## 2014-02-24 NOTE — Progress Notes (Signed)
Subjective: [name] notes cough with white sputum production. He has had no fever. Oxygen saturations are stable on supplemental oxygen. He complains of nausea.  Objective: Vital signs in last 24 hours: Filed Vitals:   02/23/14 0511 02/23/14 1325 02/23/14 2207 02/24/14 0513  BP: 123/63 107/62 109/62 120/72  Pulse: 86 85 82 88  Temp: 97.9 F (36.6 C) 98.2 F (36.8 C) 98.4 F (36.9 C) 98.5 F (36.9 C)  TempSrc: Oral Oral Oral Oral  Resp: 16 16 16 16   Height:      Weight:      SpO2: 97% 96% 100% 100%   Weight change:   Intake/Output Summary (Last 24 hours) at 02/24/14 0748 Last data filed at 02/23/14 2207  Gross per 24 hour  Intake 6001.25 ml  Output    975 ml  Net 5026.25 ml    Physical Exam: Alert. No distress. Lungs clear. Heart regular with no murmurs. Abdomen is nontender with no palpable organomegaly.  Lab Results:    Results for orders placed or performed during the hospital encounter of 02/20/14 (from the past 24 hour(s))  Basic metabolic panel     Status: Abnormal   Collection Time: 02/24/14  6:42 AM  Result Value Ref Range   Sodium 142 135 - 145 mmol/L   Potassium 3.7 3.5 - 5.1 mmol/L   Chloride 107 96 - 112 mmol/L   CO2 25 19 - 32 mmol/L   Glucose, Bld 84 70 - 99 mg/dL   BUN 47 (H) 6 - 23 mg/dL   Creatinine, Ser 4.13 (H) 0.50 - 1.35 mg/dL   Calcium 8.5 8.4 - 10.5 mg/dL   GFR calc non Af Amer 12 (L) >90 mL/min   GFR calc Af Amer 13 (L) >90 mL/min   Anion gap 10 5 - 15  CBC     Status: Abnormal   Collection Time: 02/24/14  6:42 AM  Result Value Ref Range   WBC 6.7 4.0 - 10.5 K/uL   RBC 2.80 (L) 4.22 - 5.81 MIL/uL   Hemoglobin 8.8 (L) 13.0 - 17.0 g/dL   HCT 27.1 (L) 39.0 - 52.0 %   MCV 96.8 78.0 - 100.0 fL   MCH 31.4 26.0 - 34.0 pg   MCHC 32.5 30.0 - 36.0 g/dL   RDW 19.9 (H) 11.5 - 15.5 %   Platelets 59 (L) 150 - 400 K/uL     ABGS No results for input(s): PHART, PO2ART, TCO2, HCO3 in the last 72 hours.  Invalid input(s):  PCO2 CULTURES Recent Results (from the past 240 hour(s))  Culture, blood (routine x 2)     Status: None (Preliminary result)   Collection Time: 02/20/14  1:31 PM  Result Value Ref Range Status   Specimen Description BLOOD LEFT ARM DRAWN BY RN  Final   Special Requests BOTTLES DRAWN AEROBIC AND ANAEROBIC 6CC BOTTLES  Final   Culture NO GROWTH 3 DAYS  Final   Report Status PENDING  Incomplete  Culture, blood (routine x 2)     Status: None (Preliminary result)   Collection Time: 02/20/14  1:42 PM  Result Value Ref Range Status   Specimen Description BLOOD LEFT ARM DRAWN BY RN  Final   Special Requests   Final    BOTTLES DRAWN AEROBIC AND ANAEROBIC [place]   Culture NO GROWTH 3 DAYS  Final   Report Status PENDING  Incomplete   Studies/Results: Dg Swallowing Func-speech Pathology  02/23/2014   [name], CCC-SLP  02/23/2014  5:38 PM  Objective Swallowing Evaluation: Modified Barium Swallowing Study   Patient Details  Name: [name] MRN: [id number] Date of Birth: 06-30-1921  Today's Date: 02/23/2014 Time: SLP Start Time (ACUTE ONLY): 1215-SLP Stop Time (ACUTE  ONLY): 1236 SLP Time Calculation (min) (ACUTE ONLY): 21 min  Past Medical History:  Past Medical History  Diagnosis Date  . Gout   . Stroke 1993, 1995    chronic balance issues  . Hyperlipidemia   . Emphysema   . Hypertension   . CAD (coronary artery disease)     stent 2006  . Seizure disorder   . GERD (gastroesophageal reflux disease)   . Cancer   . Asthma   . Syncope Jan. 1, 2014  . AAA (abdominal aortic aneurysm)   . Sinus node dysfunction     s/p PPM implant April 2015 (ST Jude)  . Pneumonia   . Myocardial infarction   . CHF (congestive heart failure)   . Seizures   . Renal disorder   . CKD (chronic kidney disease) stage 4, GFR 15-29 ml/min 04/17/2013    Past Surgical History:  Past Surgical History  Procedure Laterality Date  . Cardiac catheterization  08/23/2004    bare metal stenting of the proximal circumflex  . Colonoscopy       X 2  . Colonoscopy  11/01/2011    Procedure: COLONOSCOPY;  Surgeon: Rogene Houston, MD;   [facility];  Service: Endoscopy;  Laterality: N/A;   100  . Eye surgery    . Nose surgery    . Pacemaker insertion  04-29-13    STJ Assurity dual chamber pacemaker implanted by Dr Rayann Heman for  syncope and sinus node dysfunction  . Esophagogastroduodenoscopy N/A 12/16/2013    Procedure: ESOPHAGOGASTRODUODENOSCOPY (EGD);  Surgeon: Rogene Houston, MD;  [facility];  Service: Endoscopy;   Laterality: N/A;  . Permanent pacemaker insertion N/A 04/29/2013    Procedure: PERMANENT PACEMAKER INSERTION;  Surgeon: Coralyn Mark, MD;  Location: [facility];  Service: Cardiovascular;   Laterality: N/A;  . Tongue biopsy Right 12/2013   HPI:  HPI: Patient is 81 year old Caucasian male with multiple medical  problems who was at this facility in December 2015 when he was  evaluated for heme positive stool and anemia. He was discharged  to [facility] for [facility]. He improved and was able  to come home just before Christmas. Patient did well for 5-6  weeks. Over the last 2-3 weeks he has developed progressive  weakness decrease in appetite. He had lost few pounds. He was  brought to emergency room 2 days ago. He was noted to have  heme-positive stool. Also felt to have pneumonia and therefore  hospitalized. Patient was also felt to have acute on chronic  renal failure secondary to dehydration and has been evaluated by  Dr. Hinda Lenis of nephrology service. Patient states he does not  feel well. He has dyspnea with minimal exertion. He become short  of breath even when he eats his meals. He is chronically on nasal  O2. He denies nausea vomiting heartburn or dysphagia. Back in  December 2015 was hospitalized for anemia and occult GI bleed. He  underwent esophagogastroduodenoscopy and he was noted to have  fresh blood and hypopharynx and in upper airway. No bleeding  lesion was identified in upper GI tract. He  had unremarkable  chest CT angiogram and subsequent was seen by Dr. Benjamine Mola and no  lesion was found in his of pharynx to account for blood loss. Pt  endorses difficulty swallowing at times. SLP asked to evaluate  swallow.  No Data Recorded  Assessment / Plan / Recommendation CHL IP CLINICAL IMPRESSIONS 02/23/2014  Dysphagia Diagnosis WFL;Suspected primary esophageal dysphagia  Clinical impression Oropharyngeal swallow is essentially WNL for  age across textures and consistencies; mild premature spillage  with thin liquids into valleculae. No penetration, aspiration, or  residuals observed (assessed with thin, puree, regular texture,  and pill with thin). Barium tablet was retained in distal  esophagus near the GE junction for ~4 minutes. Thin liquid  bypassed the pill and emptied into stomach, but puree backed up  behind pill all the way to thoracic esophagus. Suspect age  related esophageal dysmotility with delayed emptying, however no  radiologist present to confirm. Recommend self regulated  "regular" textures (pt prefers soft) with thin liquids with  strict reflux precautions due to delayed emptying of esophagus  with puree and solids. No further SLP services indicated at this  time.       CHL IP TREATMENT RECOMMENDATION 02/23/2014  Treatment Plan Recommendations No treatment recommended at this  time     CHL IP DIET RECOMMENDATION 02/23/2014  Diet Recommendations Dysphagia 3 (Mechanical Soft);Thin liquid  Liquid Administration via Cup;Straw  Medication Administration Whole meds with liquid  Compensations Slow rate;Small sips/bites  Postural Changes and/or Swallow Maneuvers Seated upright 90  degrees;Upright 30-60 min after meal     CHL IP OTHER RECOMMENDATIONS 02/23/2014  Recommended Consults (None)  Oral Care Recommendations Oral care BID  Other Recommendations Clarify dietary restrictions     CHL IP FOLLOW UP RECOMMENDATIONS 02/23/2014  Follow up Recommendations 24 hour supervision/assistance     CHL IP FREQUENCY AND  DURATION 02/22/2014  Speech Therapy Frequency (ACUTE ONLY) min 2x/week  Treatment Duration 1 week     Pertinent Vitals/Pain VSS    SLP Swallow Goals No flowsheet data found.  No flowsheet data found.    CHL IP REASON FOR REFERRAL 02/23/2014  Reason for Referral Objectively evaluate swallowing function     CHL IP ORAL PHASE 02/23/2014  Lips (None)  Tongue (None)  Mucous membranes (None)  Nutritional status (None)  Other (None)  Oxygen therapy (None)  Oral Phase WFL  Oral - Pudding Teaspoon (None)  Oral - Pudding Cup (None)  Oral - Honey Teaspoon (None)  Oral - Honey Cup (None)  Oral - Honey Syringe (None)  Oral - Nectar Teaspoon (None)  Oral - Nectar Cup (None)  Oral - Nectar Straw (None)  Oral - Nectar Syringe (None)  Oral - Ice Chips (None)  Oral - Thin Teaspoon (None)  Oral - Thin Cup (None)  Oral - Thin Straw (None)  Oral - Thin Syringe (None)  Oral - Puree (None)  Oral - Mechanical Soft (None)  Oral - Regular (None)  Oral - Multi-consistency (None)  Oral - Pill (None)  Oral Phase - Comment (None)      CHL IP PHARYNGEAL PHASE 02/23/2014  Pharyngeal Phase WFL  Pharyngeal - Pudding Teaspoon (None)  Penetration/Aspiration details (pudding teaspoon) (None)  Pharyngeal - Pudding Cup (None)  Penetration/Aspiration details (pudding cup) (None)  Pharyngeal - Honey Teaspoon (None)  Penetration/Aspiration details (honey teaspoon) (None)  Pharyngeal - Honey Cup (None)  Penetration/Aspiration details (honey cup) (None)  Pharyngeal - Honey Syringe (None)  Penetration/Aspiration details (honey syringe) (None)  Pharyngeal - Nectar Teaspoon (None)  Penetration/Aspiration details (nectar teaspoon) (None)  Pharyngeal - Nectar Cup (None)  Penetration/Aspiration details (nectar cup) (None)  Pharyngeal - Nectar Straw (None)  Penetration/Aspiration details (nectar straw) (None)  Pharyngeal - Nectar Syringe (None)  Penetration/Aspiration details (nectar syringe) (None)  Pharyngeal - Ice Chips (None)  Penetration/Aspiration details (ice chips)  (None)  Pharyngeal - Thin Teaspoon (None)  Penetration/Aspiration details (thin teaspoon) (None)  Pharyngeal - Thin Cup (None)  Penetration/Aspiration details (thin cup) (None)  Pharyngeal - Thin Straw (None)  Penetration/Aspiration details (thin straw) (None)  Pharyngeal - Thin Syringe (None)  Penetration/Aspiration details (thin syringe') (None)  Pharyngeal - Puree (None)  Penetration/Aspiration details (puree) (None)  Pharyngeal - Mechanical Soft (None)  Penetration/Aspiration details (mechanical soft) (None)  Pharyngeal - Regular (None)  Penetration/Aspiration details (regular) (None)  Pharyngeal - Multi-consistency (None)  Penetration/Aspiration details (multi-consistency) (None)  Pharyngeal - Pill (None)  Penetration/Aspiration details (pill) (None)  Pharyngeal Comment (None)     CHL IP CERVICAL ESOPHAGEAL PHASE 02/23/2014  Cervical Esophageal Phase WFL  Pudding Teaspoon (None)  Pudding Cup (None)  Honey Teaspoon (None)  Honey Cup (None)  Honey Syringe (None)  Nectar Teaspoon (None)  Nectar Cup (None)  Nectar Straw (None)  Nectar Syringe (None)  Thin Teaspoon (None)  Thin Cup (None)  Thin Straw (None)  Thin Syringe (None)  Cervical Esophageal Comment (None)   No flowsheet data found.        Thank you,  Genene Churn, [place]  [place] 02/23/2014, 5:35 PM    Micro Results: Recent Results (from the past 240 hour(s))  Culture, blood (routine x 2)     Status: None (Preliminary result)   Collection Time: 02/20/14  1:31 PM  Result Value Ref Range Status   Specimen Description BLOOD LEFT ARM DRAWN BY RN  Final   Special Requests BOTTLES DRAWN AEROBIC AND ANAEROBIC 6CC BOTTLES  Final   Culture NO GROWTH 3 DAYS  Final   Report Status PENDING  Incomplete  Culture, blood (routine x 2)     Status: None (Preliminary result)   Collection Time: 02/20/14  1:42 PM  Result Value Ref Range Status   Specimen Description BLOOD LEFT ARM DRAWN BY RN  Final   Special Requests   Final    BOTTLES DRAWN  AEROBIC AND ANAEROBIC [place]   Culture NO GROWTH 3 DAYS  Final   Report Status PENDING  Incomplete   Studies/Results: Dg Swallowing Func-speech Pathology  02/23/2014   [name], CCC-SLP     02/23/2014  5:38 PM  Objective Swallowing Evaluation: Modified Barium Swallowing Study   Patient Details  Name: [name] MRN: [id number] Date of Birth: [birth year]  Today's Date: 02/23/2014 Time: SLP Start Time (ACUTE ONLY): 1215-SLP Stop Time (ACUTE  ONLY): 1236 SLP Time Calculation (min) (ACUTE ONLY): 21 min  Past Medical History:  Past Medical History  Diagnosis Date  . Gout   . Stroke 1993, 1995    chronic balance issues  . Hyperlipidemia   . Emphysema   . Hypertension   . CAD (coronary artery disease)     stent 2006  . Seizure disorder   . GERD (gastroesophageal reflux disease)   . Cancer   . Asthma   . Syncope Jan. 1, 2014  . AAA (abdominal aortic aneurysm)   . Sinus node dysfunction     s/p PPM implant April 2015 (ST Jude)  . Pneumonia   . Myocardial infarction   . CHF (congestive heart failure)   . Seizures   . Renal disorder   . CKD (chronic kidney disease)  stage 4, GFR 15-29 ml/min 04/17/2013    Past Surgical History:  Past Surgical History  Procedure Laterality Date  . Cardiac catheterization  08/23/2004    bare metal stenting of the proximal circumflex  . Colonoscopy      X 2  . Colonoscopy  11/01/2011    Procedure: COLONOSCOPY;  Surgeon: Rogene Houston, MD;   [facility]: [facility];  Service: Endoscopy;  Laterality: N/A;   100  . Eye surgery    . Nose surgery    . Pacemaker insertion  04-29-13    STJ Assurity dual chamber pacemaker implanted by Dr Rayann Heman for  syncope and sinus node dysfunction  . Esophagogastroduodenoscopy N/A 12/16/2013    Procedure: ESOPHAGOGASTRODUODENOSCOPY (EGD);  Surgeon: Rogene Houston, MD;  [facility];  Service: Endoscopy;   Laterality: N/A;  . Permanent pacemaker insertion N/A 04/29/2013    Procedure: PERMANENT PACEMAKER INSERTION;  Surgeon: Coralyn Mark, MD;   Location: [facility];  Service: Cardiovascular;   Laterality: N/A;  . Tongue biopsy Right 12/2013   HPI:  HPI: Patient is 81 year old Caucasian male with multiple medical  problems who was at this facility in December 2015 when he was  evaluated for heme positive stool and anemia. He was discharged  to [facility] for rehabilitation. He improved and was able  to come home just before Christmas. Patient did well for 5-6  weeks. Over the last 2-3 weeks he has developed progressive  weakness decrease in appetite. He had lost few pounds. He was  brought to emergency room 2 days ago. He was noted to have  heme-positive stool. Also felt to have pneumonia and therefore  hospitalized. Patient was also felt to have acute on chronic  renal failure secondary to dehydration and has been evaluated by  Dr. Hinda Lenis of nephrology service. Patient states he does not  feel well. He has dyspnea with minimal exertion. He become short  of breath even when he eats his meals. He is chronically on nasal  O2. He denies nausea vomiting heartburn or dysphagia. Back in  December 2015 was hospitalized for anemia and occult GI bleed. He  underwent esophagogastroduodenoscopy and he was noted to have  fresh blood and hypopharynx and in upper airway. No bleeding  lesion was identified in upper GI tract. He had unremarkable  chest CT angiogram and subsequent was seen by Dr. Benjamine Mola and no  lesion was found in his of pharynx to account for blood loss. Pt  endorses difficulty swallowing at times. SLP asked to evaluate  swallow.  No Data Recorded  Assessment / Plan / Recommendation CHL IP CLINICAL IMPRESSIONS 02/23/2014  Dysphagia Diagnosis WFL;Suspected primary esophageal dysphagia  Clinical impression Oropharyngeal swallow is essentially WNL for  age across textures and consistencies; mild premature spillage  with thin liquids into valleculae. No penetration, aspiration, or  residuals observed (assessed with thin, puree, regular texture,  and pill  with thin). Barium tablet was retained in distal  esophagus near the GE junction for ~4 minutes. Thin liquid  bypassed the pill and emptied into stomach, but puree backed up  behind pill all the way to thoracic esophagus. Suspect age  related esophageal dysmotility with delayed emptying, however no  radiologist present to confirm. Recommend self regulated  "regular" textures (pt prefers soft) with thin liquids with  strict reflux precautions due to delayed emptying of esophagus  with puree and solids. No further SLP services indicated at this  time.  CHL IP TREATMENT RECOMMENDATION 02/23/2014  Treatment Plan Recommendations No treatment recommended at this  time     CHL IP DIET RECOMMENDATION 02/23/2014  Diet Recommendations Dysphagia 3 (Mechanical Soft);Thin liquid  Liquid Administration via Cup;Straw  Medication Administration Whole meds with liquid  Compensations Slow rate;Small sips/bites  Postural Changes and/or Swallow Maneuvers Seated upright 90  degrees;Upright 30-60 min after meal     CHL IP OTHER RECOMMENDATIONS 02/23/2014  Recommended Consults (None)  Oral Care Recommendations Oral care BID  Other Recommendations Clarify dietary restrictions     CHL IP FOLLOW UP RECOMMENDATIONS 02/23/2014  Follow up Recommendations 24 hour supervision/assistance     CHL IP FREQUENCY AND DURATION 02/22/2014  Speech Therapy Frequency (ACUTE ONLY) min 2x/week  Treatment Duration 1 week     Pertinent Vitals/Pain VSS    SLP Swallow Goals No flowsheet data found.  No flowsheet data found.    CHL IP REASON FOR REFERRAL 02/23/2014  Reason for Referral Objectively evaluate swallowing function     CHL IP ORAL PHASE 02/23/2014  Lips (None)  Tongue (None)  Mucous membranes (None)  Nutritional status (None)  Other (None)  Oxygen therapy (None)  Oral Phase WFL  Oral - Pudding Teaspoon (None)  Oral - Pudding Cup (None)  Oral - Honey Teaspoon (None)  Oral - Honey Cup (None)  Oral - Honey Syringe (None)  Oral - Nectar Teaspoon (None)  Oral -  Nectar Cup (None)  Oral - Nectar Straw (None)  Oral - Nectar Syringe (None)  Oral - Ice Chips (None)  Oral - Thin Teaspoon (None)  Oral - Thin Cup (None)  Oral - Thin Straw (None)  Oral - Thin Syringe (None)  Oral - Puree (None)  Oral - Mechanical Soft (None)  Oral - Regular (None)  Oral - Multi-consistency (None)  Oral - Pill (None)  Oral Phase - Comment (None)      CHL IP PHARYNGEAL PHASE 02/23/2014  Pharyngeal Phase WFL  Pharyngeal - Pudding Teaspoon (None)  Penetration/Aspiration details (pudding teaspoon) (None)  Pharyngeal - Pudding Cup (None)  Penetration/Aspiration details (pudding cup) (None)  Pharyngeal - Honey Teaspoon (None)  Penetration/Aspiration details (honey teaspoon) (None)  Pharyngeal - Honey Cup (None)  Penetration/Aspiration details (honey cup) (None)  Pharyngeal - Honey Syringe (None)  Penetration/Aspiration details (honey syringe) (None)  Pharyngeal - Nectar Teaspoon (None)  Penetration/Aspiration details (nectar teaspoon) (None)  Pharyngeal - Nectar Cup (None)  Penetration/Aspiration details (nectar cup) (None)  Pharyngeal - Nectar Straw (None)  Penetration/Aspiration details (nectar straw) (None)  Pharyngeal - Nectar Syringe (None)  Penetration/Aspiration details (nectar syringe) (None)  Pharyngeal - Ice Chips (None)  Penetration/Aspiration details (ice chips) (None)  Pharyngeal - Thin Teaspoon (None)  Penetration/Aspiration details (thin teaspoon) (None)  Pharyngeal - Thin Cup (None)  Penetration/Aspiration details (thin cup) (None)  Pharyngeal - Thin Straw (None)  Penetration/Aspiration details (thin straw) (None)  Pharyngeal - Thin Syringe (None)  Penetration/Aspiration details (thin syringe') (None)  Pharyngeal - Puree (None)  Penetration/Aspiration details (puree) (None)  Pharyngeal - Mechanical Soft (None)  Penetration/Aspiration details (mechanical soft) (None)  Pharyngeal - Regular (None)  Penetration/Aspiration details (regular) (None)  Pharyngeal - Multi-consistency (None)   Penetration/Aspiration details (multi-consistency) (None)  Pharyngeal - Pill (None)  Penetration/Aspiration details (pill) (None)  Pharyngeal Comment (None)     CHL IP CERVICAL ESOPHAGEAL PHASE 02/23/2014  Cervical Esophageal Phase WFL  Pudding Teaspoon (None)  Pudding Cup (None)  Honey Teaspoon (None)  Honey Cup (None)  Honey Syringe (None)  Nectar Teaspoon (None)  Nectar Cup (None)  Nectar Straw (None)  Nectar Syringe (None)  Thin Teaspoon (None)  Thin Cup (None)  Thin Straw (None)  Thin Syringe (None)  Cervical Esophageal Comment (None)   No flowsheet data found.        Thank you,  Genene Churn, [place]  [place] 02/23/2014, 5:35 PM    Medications:  I have reviewed the patient's current medications Scheduled Meds: . ceFEPime (MAXIPIME) IV  1 g Intravenous Q24H  . ferric gluconate (FERRLECIT/NULECIT) IV  250 mg Intravenous Once  . folic acid  1 mg Oral Daily  . furosemide  80 mg Intravenous BID  . metoprolol tartrate  12.5 mg Oral Daily  . metronidazole  500 mg Intravenous Q8H  . Oxcarbazepine  300 mg Oral BID  . pantoprazole  40 mg Oral BID AC  . sodium chloride  3 mL Intravenous Q12H  . tamsulosin  0.4 mg Oral QHS   Continuous Infusions: . sodium chloride 75 mL/hr at 02/23/14 1023   PRN Meds:.nitroGLYCERIN, ondansetron **OR** ondansetron (ZOFRAN) IV   Assessment/Plan: #1. Possible pneumonia. Continue cefepime and metronidazole. No leukocytosis or fever. #2. Chronic kidney disease stage IV. He continues IV Lasix and IV fluids. Creatinine is 4.1. #3. Chronic GI bleed and anemia. Hemoglobin is stable. #4. Dysphagia. Swallowing study reveals no aspiration. #5. Nausea. Abdominal ultrasound today. #6. Thrombocytopenia. Active Problems:   Anemia   Acute renal failure     LOS: 4 days   Sachiko Methot 02/24/2014, 7:48 AM

## 2014-02-24 NOTE — Clinical Social Work Note (Signed)
CSW presented bed offers and after much discussion, pt chooses [facility]. Pt aware of semi-private room. Pt's children also in room. Facility notified. Awaiting stability for d/c. CSW will continue to follow.   Benay Pike, [place]

## 2014-02-25 ENCOUNTER — Inpatient Hospital Stay
Admission: RE | Admit: 2014-02-25 | Discharge: 2014-03-22 | Disposition: A | Discharge status: Other Acute Inpatient Hospital | Payer: No Typology Code available for payment source | Source: Ambulatory Visit | Attending: Internal Medicine | Admitting: Internal Medicine

## 2014-02-25 DIAGNOSIS — K5900 Constipation, unspecified: Secondary | ICD-10-CM

## 2014-02-25 DIAGNOSIS — R1110 Vomiting, unspecified: Principal | ICD-10-CM

## 2014-02-25 LAB — CULTURE, BLOOD (ROUTINE X 2)
CULTURE: NO GROWTH
CULTURE: NO GROWTH

## 2014-02-25 MED ORDER — FUROSEMIDE 10 MG/ML IJ SOLN
80.0000 mg | Freq: Once | INTRAMUSCULAR | Status: AC
  Administered 2014-02-25: 80 mg via INTRAVENOUS
  Filled 2014-02-25: qty 8

## 2014-02-25 MED ORDER — DEXTROSE 5 % IV SOLN: 120.0000 mg | Freq: Two times a day (BID) | INTRAVENOUS | Status: DC

## 2014-02-25 MED ORDER — BISACODYL 5 MG PO TBEC
5.0000 mg | DELAYED_RELEASE_TABLET | Freq: Once | ORAL | Status: AC
Start: 2014-02-25 — End: 2014-02-25
  Administered 2014-02-25: 5 mg via ORAL
  Filled 2014-02-25: qty 1

## 2014-02-25 MED ORDER — ONDANSETRON HCL 4 MG PO TABS: 4.0000 mg | ORAL_TABLET | Freq: Four times a day (QID) | ORAL | Status: AC | PRN

## 2014-02-25 MED ORDER — BISACODYL 10 MG RE SUPP
10.0000 mg | Freq: Once | RECTAL | Status: AC
  Administered 2014-02-25: 10 mg via RECTAL
  Filled 2014-02-25: qty 1

## 2014-02-25 MED ORDER — CEFUROXIME AXETIL 500 MG PO TABS: 500.0000 mg | ORAL_TABLET | Freq: Every morning | ORAL | Status: AC

## 2014-02-25 MED ORDER — FOLIC ACID 1 MG PO TABS: 1.0000 mg | ORAL_TABLET | Freq: Every day | ORAL | Status: AC

## 2014-02-25 NOTE — Progress Notes (Signed)
Pt had not had a bowel movement since 02/22/14. Dr. Willey Blade notified and gave verbal order for a one time Dulcolax suppository and one Dulcolax tablet.

## 2014-02-25 NOTE — Progress Notes (Signed)
Patient with discharge orders. Dr. Lowanda Foster in facility ordered IV lasix 120mg  and some lab work in the AM. Notified Dr. Lowanda Foster of patient pending discharge. New orders to decrease IV Lasix back to 80mg , one time dose prior to discharge.

## 2014-02-25 NOTE — Clinical Social Work Note (Signed)
Pt d/c today to [facility]. Pt, pt's daughter Mikle Bosworth, and facility aware and agreeable. Pt to transfer with RN.  Benay Pike, [place]

## 2014-02-25 NOTE — Progress Notes (Signed)
Patient with orders to be discharge to [facility]. Report called to [facility], nurse. Discharge packet sent with patient. Patient stable. Patient transported via staff.

## 2014-02-25 NOTE — Clinical Social Work Placement (Signed)
Clinical Social Work Department CLINICAL SOCIAL WORK PLACEMENT NOTE 02/25/2014  Patient:  [name], [name]  Account Number:  [id number] Admit date:  02/20/2014  Clinical Social Worker:  Benay Pike, LCSW  Date/time:  02/23/2014 02:07 PM  Clinical Social Work is seeking post-discharge placement for this patient at the following level of care:   [place]   (*CSW will update this form in Epic as items are completed)   02/23/2014  Patient/family provided with [facility] Department of Clinical Social Work's list of facilities offering this level of care within the geographic area requested by the patient (or if unable, by the patient's family).  02/23/2014  Patient/family informed of their freedom to choose among providers that offer the needed level of care, that participate in Medicare, Medicaid or managed care program needed by the patient, have an available bed and are willing to accept the patient.  02/23/2014  Patient/family informed of [facility]' ownership interest in [facility], as well as of the fact that they are under no obligation to receive care at this facility.  PASARR submitted to EDS on  PASARR number received on   FL2 transmitted to all facilities in geographic area requested by pt/family on  02/23/2014 FL2 transmitted to all facilities within larger geographic area on   Patient informed that his/her managed care company has contracts with or will negotiate with  certain facilities, including the following:     Patient/family informed of bed offers received:  02/24/2014 Patient chooses bed at [facility] Physician recommends and patient chooses bed at    Patient to be transferred to [facility] on  02/25/2014 Patient to be transferred to facility by RN Patient and family notified of transfer on 02/25/2014 Name of family member notified:  Mikle Bosworth- daughter  The following physician request were entered in Epic:   Additional  Comments: Pt has existing pasarr.  Benay Pike, [place]

## 2014-02-25 NOTE — Discharge Summary (Signed)
Physician Discharge Summary  [name] KVQ:[id number] DOB: May 02, 1921 DOA: 02/20/2014   Admit date: 02/20/2014 Discharge date: 02/25/2014  Discharge Diagnoses: #1. Pneumonia. #2. Chronic kidney disease stage IV. #3. Chronic GI bleeding. #4. Anemia of chronic disease/iron deficiency. #5. Chronic nausea. #6. Thrombocytopenia. #7. Seizure disorder. #8. Coronary artery disease. #9. Status post pacemaker placement. #10. Chronic diastolic heart failure. #11. UTI. Culture negative. #12. BPH. Active Problems:   Anemia   Acute renal failure    Wt Readings from Last 3 Encounters:  02/22/14 168 lb (76.204 kg)  12/29/13 168 lb 4.8 oz (76.34 kg)  12/13/13 170 lb (77.111 kg)     Hospital Course:  This patient is a 81 year old male who presented with weakness and cough. His chest x-ray revealed bilateral effusions and possible infiltrate. He was treated with cefepime and metronidazole. He had no leukocytosis. He had no fever. He has had generalized weakness and chronic nausea. His cough has produced only white mucus. He denies chest pain. He has chronic kidney disease stage IV. He was evaluated and treated by nephrology. He had no evidence of hydronephrosis by ultrasound. Creatinine has been in the 4 range. He has been treated with fluids and Lasix. There has been minimal creatinine improvement. Therapy will be modified back to torsemide daily for his chronic diastolic heart failure which has been asymptomatic through this hospitalization.  He had evidence of UTI on his initial urinalysis however his culture was negative.  He has been evaluated and treated by hematology for his anemia of chronic disease/iron deficiency. He has been treated with iron, folic acid, vitamin I-43 and Aranesp. Hemoglobin is 8.8. He has had a thrombocytopenia with a drop to 59,000.  He has been evaluated by gastroenterology. He has had chronic nausea. Transaminases have been normal. An ultrasound revealed some  gallbladder wall thickening but no gallstones. Liver and bile ducts are unremarkable.  Due to his generalized weakness and limited mobility he will not be able to care for himself at home. Arrangements have been made for transfer to the [facility]. He will be seen in follow-up there.  Please obtain a complete metabolic profile and a CBC in one week.  He will continue physical therapy there.   Discharge Instructions     Medication List    STOP taking these medications        donepezil 10 MG tablet  Commonly known as:  ARICEPT     Linaclotide 145 MCG Caps capsule  Commonly known as:  LINZESS     tiotropium 18 MCG inhalation capsule  Commonly known as:  SPIRIVA HANDIHALER      TAKE these medications        allopurinol 100 MG tablet  Commonly known as:  ZYLOPRIM  Take 1 tablet (100 mg total) by mouth every morning.     cefUROXime 500 MG tablet  Commonly known as:  CEFTIN  Take 1 tablet (500 mg total) by mouth every morning.     [facility] MELATONIN 3 MG Tabs  Generic drug:  Melatonin  Take 3 mg by mouth at bedtime.     famotidine 20 MG tablet  Commonly known as:  PEPCID  TAKE ONE TABLET BY MOUTH TWICE DAILY.     ferrous gluconate 324 MG tablet  Commonly known as:  FERGON  Take 1 tablet (324 mg total) by mouth daily with breakfast.     folic acid 1 MG tablet  Commonly known as:  FOLVITE  Take 1 tablet (1 mg total) by  mouth daily.     isosorbide mononitrate 30 MG 24 hr tablet  Commonly known as:  IMDUR  Take 0.5 tablets (15 mg total) by mouth daily.     metoprolol tartrate 25 MG tablet  Commonly known as:  LOPRESSOR  TAKE ONE HALF TABLET BY MOUTH TWICE DAILY.     nitroGLYCERIN 0.4 MG SL tablet  Commonly known as:  NITROSTAT  Place 1 tablet (0.4 mg total) under the tongue every 5 (five) minutes as needed for chest pain. 1 tablet under tongue at onset of chest pain;you may repeat every 5 minutes for up to 3 doses.     ondansetron 4 MG tablet  Commonly known as:   ZOFRAN  Take 1 tablet (4 mg total) by mouth every 6 (six) hours as needed for nausea.     Oxcarbazepine 300 MG tablet  Commonly known as:  TRILEPTAL  Take 300 mg by mouth 2 (two) times daily.     polyethylene glycol powder powder  Commonly known as:  GLYCOLAX/MIRALAX  Take 17 g by mouth daily. *Mixed in 4-8 ounces of liquid and drink daily     pravastatin 40 MG tablet  Commonly known as:  PRAVACHOL  Take 40 mg by mouth every evening.     tamsulosin 0.4 MG Caps capsule  Commonly known as:  FLOMAX  Take 0.4 mg by mouth at bedtime.     torsemide 20 MG tablet  Commonly known as:  DEMADEX  Take 1 tablet (20 mg total) by mouth daily.     VITAMIN B1-B12 IM  Inject into the muscle every 30 (thirty) days. Once a month         Raquel Racey 02/25/2014

## 2014-02-25 NOTE — Progress Notes (Signed)
Subjective: Interval History: has complaints of some nausea but no vomiting. His appetite is still poor. He has occasional cough with some mucus production. He has occasional difficulty breathing but seems to be getting better..  Objective: Vital signs in last 24 hours: Temp:  [97.5 F (36.4 C)-97.8 F (36.6 C)] 97.8 F (36.6 C) (02/11 0502) Pulse Rate:  [84-86] 86 (02/11 0502) Resp:  [15-20] 17 (02/11 0502) BP: (105-120)/(57-62) 120/57 mmHg (02/11 0502) SpO2:  [96 %-100 %] 96 % (02/11 0502) Weight change:   Intake/Output from previous day: 02/10 0701 - 02/11 0700 In: 2351.3 [P.O.:480; I.V.:1871.3] Out: 1400 [Urine:1400] Intake/Output this shift:    General appearance: alert, cooperative and no distress Resp: diminished breath sounds bilaterally Cardio: regular rate and rhythm, S1, S2 normal, no murmur, click, rub or gallop GI: soft, non-tender; bowel sounds normal; no masses,  no organomegaly Extremities: edema 1-2+ edema  Lab Results:  Recent Labs  02/23/14 0605 02/24/14 0642  WBC 6.7 6.7  HGB 9.2* 8.8*  HCT 27.9* 27.1*  PLT 62* 59*   BMET:  Recent Labs  02/23/14 0605 02/24/14 0642  NA 142 142  K 3.9 3.7  CL 108 107  CO2 25 25  GLUCOSE 88 84  BUN 47* 47*  CREATININE 4.11* 4.13*  CALCIUM 8.4 8.5   No results for input(s): PTH in the last 72 hours. Iron Studies: No results for input(s): IRON, TIBC, TRANSFERRIN, FERRITIN in the last 72 hours.  Studies/Results: US Abdomen Complete  02/24/2014   CLINICAL DATA:  Nausea for several weeks.  EXAM: ULTRASOUND ABDOMEN COMPLETE  COMPARISON:  None.  FINDINGS: Gallbladder: No gallstones are noted. Mild gallbladder wall thickening is noted measured at 4.5 mm. Possible minimal pericholecystic fluid is present. No sonographic Percell Miller sign is noted.  Common bile duct: Diameter: 3.4 mm which is within normal limits.  Liver: 1 cm hyperechoic focus is noted in right hepatic lobe most consistent with hemangioma. Within normal  limits in parenchymal echogenicity.  IVC: No abnormality visualized.  Pancreas: Not visualized due to overlying bowel gas.  Spleen: Size and appearance within normal limits.  Right Kidney: Length: 10.4 cm. Mildly increased echogenicity of renal parenchyma is noted. No mass or hydronephrosis visualized.  Left Kidney: Length: 10.8 cm. Mildly increased echogenicity of renal parenchyma is noted. No mass or hydronephrosis visualized.  Abdominal aorta: No aneurysm visualized.  Other findings: Possible bilateral pleural effusions.  IMPRESSION: Possible bilateral pleural effusions.  Mild gallbladder wall thickening is noted with possible minimal pericholecystic fluid, but no gallstones are noted. HIDA scan may be performed to evaluate for cholecystitis.  Pancreas not visualized due to overlying bowel gas.  Mildly increased echogenicity of renal parenchyma is noted suggesting medical renal disease.   Electronically Signed   By: Marijo Conception, M.D.   On: 02/24/2014 13:33   Dg Swallowing Func-speech Pathology  02/23/2014   Ephraim Hamburger, CCC-SLP     02/23/2014  5:38 PM  Objective Swallowing Evaluation: Modified Barium Swallowing Study   Patient Details  Name: ANGAD NABERS MRN: [id number] Date of Birth: 11-26-1921  Today's Date: 02/23/2014 Time: SLP Start Time (ACUTE ONLY): 1215-SLP Stop Time (ACUTE  ONLY): 1236 SLP Time Calculation (min) (ACUTE ONLY): 21 min  Past Medical History:  Past Medical History  Diagnosis Date  . Gout   . Stroke 1993, 1995    chronic balance issues  . Hyperlipidemia   . Emphysema   . Hypertension   . CAD (coronary artery disease)  stent 2006  . Seizure disorder   . GERD (gastroesophageal reflux disease)   . Cancer   . Asthma   . Syncope Jan. 1, 2014  . AAA (abdominal aortic aneurysm)   . Sinus node dysfunction     s/p PPM implant April 2015 (ST Jude)  . Pneumonia   . Myocardial infarction   . CHF (congestive heart failure)   . Seizures   . Renal disorder   . CKD (chronic kidney disease) stage 4,  GFR 15-29 ml/min 04/17/2013    Past Surgical History:  Past Surgical History  Procedure Laterality Date  . Cardiac catheterization  08/23/2004    bare metal stenting of the proximal circumflex  . Colonoscopy      X 2  . Colonoscopy  11/01/2011    Procedure: COLONOSCOPY;  Surgeon: Rogene Houston, MD;   Location: [facility];  Service: Endoscopy;  Laterality: N/A;   100  . Eye surgery    . Nose surgery    . Pacemaker insertion  04-29-13    STJ Assurity dual chamber pacemaker implanted by Dr Rayann Heman for  syncope and sinus node dysfunction  . Esophagogastroduodenoscopy N/A 12/16/2013    Procedure: ESOPHAGOGASTRODUODENOSCOPY (EGD);  Surgeon: Rogene Houston, MD;  Location: [facility];  Service: Endoscopy;   Laterality: N/A;  . Permanent pacemaker insertion N/A 04/29/2013    Procedure: PERMANENT PACEMAKER INSERTION;  Surgeon: Coralyn Mark, MD;  Location: [facility];  Service: Cardiovascular;   Laterality: N/A;  . Tongue biopsy Right 12/2013   HPI:  HPI: Patient is 81 year old Caucasian male with multiple medical  problems who was at this facility in December 2015 when he was  evaluated for heme positive stool and anemia. He was discharged  to [facility] for rehabilitation. He improved and was able  to come home just before Christmas. Patient did well for 5-6  weeks. Over the last 2-3 weeks he has developed progressive  weakness decrease in appetite. He had lost few pounds. He was  brought to emergency room 2 days ago. He was noted to have  heme-positive stool. Also felt to have pneumonia and therefore  hospitalized. Patient was also felt to have acute on chronic  renal failure secondary to dehydration and has been evaluated by  Dr. Hinda Lenis of nephrology service. Patient states he does not  feel well. He has dyspnea with minimal exertion. He become short  of breath even when he eats his meals. He is chronically on nasal  O2. He denies nausea vomiting heartburn or dysphagia. Back in  December 2015 was  hospitalized for anemia and occult GI bleed. He  underwent esophagogastroduodenoscopy and he was noted to have  fresh blood and hypopharynx and in upper airway. No bleeding  lesion was identified in upper GI tract. He had unremarkable  chest CT angiogram and subsequent was seen by Dr. Benjamine Mola and no  lesion was found in his of pharynx to account for blood loss. Pt  endorses difficulty swallowing at times. SLP asked to evaluate  swallow.  No Data Recorded  Assessment / Plan / Recommendation CHL IP CLINICAL IMPRESSIONS 02/23/2014  Dysphagia Diagnosis WFL;Suspected primary esophageal dysphagia  Clinical impression Oropharyngeal swallow is essentially WNL for  age across textures and consistencies; mild premature spillage  with thin liquids into valleculae. No penetration, aspiration, or  residuals observed (assessed with thin, puree, regular texture,  and pill with thin). Barium tablet was retained in distal  esophagus near the GE  junction for ~4 minutes. Thin liquid  bypassed the pill and emptied into stomach, but puree backed up  behind pill all the way to thoracic esophagus. Suspect age  related esophageal dysmotility with delayed emptying, however no  radiologist present to confirm. Recommend self regulated  "regular" textures (pt prefers soft) with thin liquids with  strict reflux precautions due to delayed emptying of esophagus  with puree and solids. No further SLP services indicated at this  time.       CHL IP TREATMENT RECOMMENDATION 02/23/2014  Treatment Plan Recommendations No treatment recommended at this  time     CHL IP DIET RECOMMENDATION 02/23/2014  Diet Recommendations Dysphagia 3 (Mechanical Soft);Thin liquid  Liquid Administration via Cup;Straw  Medication Administration Whole meds with liquid  Compensations Slow rate;Small sips/bites  Postural Changes and/or Swallow Maneuvers Seated upright 90  degrees;Upright 30-60 min after meal     CHL IP OTHER RECOMMENDATIONS 02/23/2014  Recommended Consults (None)  Oral  Care Recommendations Oral care BID  Other Recommendations Clarify dietary restrictions     CHL IP FOLLOW UP RECOMMENDATIONS 02/23/2014  Follow up Recommendations 24 hour supervision/assistance     CHL IP FREQUENCY AND DURATION 02/22/2014  Speech Therapy Frequency (ACUTE ONLY) min 2x/week  Treatment Duration 1 week     Pertinent Vitals/Pain VSS    SLP Swallow Goals No flowsheet data found.  No flowsheet data found.    CHL IP REASON FOR REFERRAL 02/23/2014  Reason for Referral Objectively evaluate swallowing function     CHL IP ORAL PHASE 02/23/2014  Lips (None)  Tongue (None)  Mucous membranes (None)  Nutritional status (None)  Other (None)  Oxygen therapy (None)  Oral Phase WFL  Oral - Pudding Teaspoon (None)  Oral - Pudding Cup (None)  Oral - Honey Teaspoon (None)  Oral - Honey Cup (None)  Oral - Honey Syringe (None)  Oral - Nectar Teaspoon (None)  Oral - Nectar Cup (None)  Oral - Nectar Straw (None)  Oral - Nectar Syringe (None)  Oral - Ice Chips (None)  Oral - Thin Teaspoon (None)  Oral - Thin Cup (None)  Oral - Thin Straw (None)  Oral - Thin Syringe (None)  Oral - Puree (None)  Oral - Mechanical Soft (None)  Oral - Regular (None)  Oral - Multi-consistency (None)  Oral - Pill (None)  Oral Phase - Comment (None)      CHL IP PHARYNGEAL PHASE 02/23/2014  Pharyngeal Phase WFL  Pharyngeal - Pudding Teaspoon (None)  Penetration/Aspiration details (pudding teaspoon) (None)  Pharyngeal - Pudding Cup (None)  Penetration/Aspiration details (pudding cup) (None)  Pharyngeal - Honey Teaspoon (None)  Penetration/Aspiration details (honey teaspoon) (None)  Pharyngeal - Honey Cup (None)  Penetration/Aspiration details (honey cup) (None)  Pharyngeal - Honey Syringe (None)  Penetration/Aspiration details (honey syringe) (None)  Pharyngeal - Nectar Teaspoon (None)  Penetration/Aspiration details (nectar teaspoon) (None)  Pharyngeal - Nectar Cup (None)  Penetration/Aspiration details (nectar cup) (None)  Pharyngeal - Nectar Straw (None)   Penetration/Aspiration details (nectar straw) (None)  Pharyngeal - Nectar Syringe (None)  Penetration/Aspiration details (nectar syringe) (None)  Pharyngeal - Ice Chips (None)  Penetration/Aspiration details (ice chips) (None)  Pharyngeal - Thin Teaspoon (None)  Penetration/Aspiration details (thin teaspoon) (None)  Pharyngeal - Thin Cup (None)  Penetration/Aspiration details (thin cup) (None)  Pharyngeal - Thin Straw (None)  Penetration/Aspiration details (thin straw) (None)  Pharyngeal - Thin Syringe (None)  Penetration/Aspiration details (thin syringe') (None)  Pharyngeal - Puree (None)  Penetration/Aspiration details (puree) (None)  Pharyngeal - Mechanical Soft (None)  Penetration/Aspiration details (mechanical soft) (None)  Pharyngeal - Regular (None)  Penetration/Aspiration details (regular) (None)  Pharyngeal - Multi-consistency (None)  Penetration/Aspiration details (multi-consistency) (None)  Pharyngeal - Pill (None)  Penetration/Aspiration details (pill) (None)  Pharyngeal Comment (None)     CHL IP CERVICAL ESOPHAGEAL PHASE 02/23/2014  Cervical Esophageal Phase WFL  Pudding Teaspoon (None)  Pudding Cup (None)  Honey Teaspoon (None)  Honey Cup (None)  Honey Syringe (None)  Nectar Teaspoon (None)  Nectar Cup (None)  Nectar Straw (None)  Nectar Syringe (None)  Thin Teaspoon (None)  Thin Cup (None)  Thin Straw (None)  Thin Syringe (None)  Cervical Esophageal Comment (None)   No flowsheet data found.        Thank you,  Genene Churn, [place]  [place] 02/23/2014, 5:35 PM     I have reviewed the patient's current medications.  Assessment/Plan: Problem #1 renal failure: Possibly acute on chronic versus natural progression of his renal failure. Presently patient seems to have some sign of uremia. His kidney function did show significant improvement. Problem #2 history of cough and difficulty breathing. Possibly a combination of COPD and CHF. Problem #3 anemia: Iron deficiency anemia and  anemia of chronic disease. Patient has received a dose of IV iron. Problem #4 hypertension: His blood pressure is reasonably controlled Problem #5 coronary artery disease Problem #6 nausea but no vomiting. Possibly in part of uremia. Problem #7 history of abdominal aortic aneurysm. I have discussed in detail with the patient about the stage of his chronic kidney disease. Presently his nausea and poor appetite may be secondary to uremia and may get worst his renal function doesn't get better. At this moment if his renal function does not improve the only option is hemodialysis however patient has this moment does not want to consider hemodialysis. Patient seems to be well aware of dialysis.  Plan: We'll DC his IV fluid We'll increase his Lasix to 120 mg IV twice a day. We'll check his basic metabolic panel in the morning.   LOS: 5 days   Lynn Sissel S 02/25/2014,8:40 AM

## 2014-03-02 ENCOUNTER — Ambulatory Visit: Payer: Medicare Other | Admitting: Family

## 2014-03-02 ENCOUNTER — Other Ambulatory Visit (HOSPITAL_COMMUNITY)
Admission: RE | Admit: 2014-03-02 | Discharge: 2014-03-02 | Disposition: A | Discharge status: Home / Self Care | Payer: Medicare Other | Source: Ambulatory Visit | Attending: Internal Medicine | Admitting: Internal Medicine

## 2014-03-02 ENCOUNTER — Ambulatory Visit (HOSPITAL_COMMUNITY): Payer: Medicare Other | Admitting: Hematology & Oncology

## 2014-03-02 ENCOUNTER — Other Ambulatory Visit (HOSPITAL_COMMUNITY): Payer: Medicare Other

## 2014-03-02 LAB — COMPREHENSIVE METABOLIC PANEL
ALT: 23 U/L (ref 0–53)
AST: 35 U/L (ref 0–37)
Albumin: 3.2 g/dL — ABNORMAL LOW (ref 3.5–5.2)
Alkaline Phosphatase: 100 U/L (ref 39–117)
Anion gap: 8 (ref 5–15)
BUN: 56 mg/dL — AB (ref 6–23)
CO2: 26 mmol/L (ref 19–32)
CREATININE: 4.8 mg/dL — AB (ref 0.50–1.35)
Calcium: 8.5 mg/dL (ref 8.4–10.5)
Chloride: 105 mmol/L (ref 96–112)
GFR, EST AFRICAN AMERICAN: 11 mL/min — AB (ref >90)
GFR, EST NON AFRICAN AMERICAN: 10 mL/min — AB (ref >90)
GLUCOSE: 92 mg/dL (ref 70–99)
Potassium: 4.2 mmol/L (ref 3.5–5.1)
Sodium: 139 mmol/L (ref 135–145)
Total Bilirubin: 0.6 mg/dL (ref 0.3–1.2)
Total Protein: 5.4 g/dL — ABNORMAL LOW (ref 6.0–8.3)

## 2014-03-02 LAB — CBC WITH DIFFERENTIAL/PLATELET
Basophils Absolute: 0 10*3/uL (ref 0.0–0.1)
Basophils Relative: 0 % (ref 0–1)
EOS ABS: 0.2 10*3/uL (ref 0.0–0.7)
Eosinophils Relative: 4 % (ref 0–5)
HEMATOCRIT: 26.5 % — AB (ref 39.0–52.0)
Hemoglobin: 8.6 g/dL — ABNORMAL LOW (ref 13.0–17.0)
LYMPHS ABS: 1.7 10*3/uL (ref 0.7–4.0)
Lymphocytes Relative: 27 % (ref 12–46)
MCH: 31.9 pg (ref 26.0–34.0)
MCHC: 32.5 g/dL (ref 30.0–36.0)
MCV: 98.1 fL (ref 78.0–100.0)
MONO ABS: 0.8 10*3/uL (ref 0.1–1.0)
MONOS PCT: 12 % (ref 3–12)
Neutro Abs: 3.7 10*3/uL (ref 1.7–7.7)
Neutrophils Relative %: 57 % (ref 43–77)
Platelets: 115 10*3/uL — ABNORMAL LOW (ref 150–400)
RBC: 2.7 MIL/uL — AB (ref 4.22–5.81)
RDW: 19.6 % — ABNORMAL HIGH (ref 11.5–15.5)
WBC: 6.4 10*3/uL (ref 4.0–10.5)

## 2014-03-03 ENCOUNTER — Encounter (HOSPITAL_COMMUNITY): Payer: Medicare Other | Attending: Hematology & Oncology

## 2014-03-03 ENCOUNTER — Encounter (HOSPITAL_COMMUNITY): Payer: Medicare Other | Attending: Hematology and Oncology | Admitting: Hematology & Oncology

## 2014-03-03 ENCOUNTER — Encounter: Payer: Self-pay | Admitting: Cardiology

## 2014-03-03 ENCOUNTER — Encounter (HOSPITAL_COMMUNITY): Payer: Self-pay | Admitting: Hematology & Oncology

## 2014-03-03 DIAGNOSIS — E611 Iron deficiency: Secondary | ICD-10-CM | POA: Diagnosis not present

## 2014-03-03 DIAGNOSIS — E538 Deficiency of other specified B group vitamins: Secondary | ICD-10-CM

## 2014-03-03 DIAGNOSIS — N189 Chronic kidney disease, unspecified: Secondary | ICD-10-CM | POA: Diagnosis not present

## 2014-03-03 DIAGNOSIS — N184 Chronic kidney disease, stage 4 (severe): Secondary | ICD-10-CM

## 2014-03-03 DIAGNOSIS — N400 Enlarged prostate without lower urinary tract symptoms: Secondary | ICD-10-CM | POA: Insufficient documentation

## 2014-03-03 DIAGNOSIS — N185 Chronic kidney disease, stage 5: Secondary | ICD-10-CM | POA: Insufficient documentation

## 2014-03-03 DIAGNOSIS — F0390 Unspecified dementia without behavioral disturbance: Secondary | ICD-10-CM | POA: Insufficient documentation

## 2014-03-03 DIAGNOSIS — D631 Anemia in chronic kidney disease: Secondary | ICD-10-CM | POA: Insufficient documentation

## 2014-03-03 DIAGNOSIS — D649 Anemia, unspecified: Secondary | ICD-10-CM

## 2014-03-03 DIAGNOSIS — I10 Essential (primary) hypertension: Secondary | ICD-10-CM | POA: Insufficient documentation

## 2014-03-03 DIAGNOSIS — K589 Irritable bowel syndrome without diarrhea: Secondary | ICD-10-CM | POA: Insufficient documentation

## 2014-03-03 LAB — CBC WITH DIFFERENTIAL/PLATELET
BASOS PCT: 0 % (ref 0–1)
Basophils Absolute: 0 10*3/uL (ref 0.0–0.1)
Eosinophils Absolute: 0.2 10*3/uL (ref 0.0–0.7)
Eosinophils Relative: 4 % (ref 0–5)
HCT: 28.8 % — ABNORMAL LOW (ref 39.0–52.0)
HEMOGLOBIN: 9.2 g/dL — AB (ref 13.0–17.0)
LYMPHS PCT: 27 % (ref 12–46)
Lymphs Abs: 1.5 10*3/uL (ref 0.7–4.0)
MCH: 31.3 pg (ref 26.0–34.0)
MCHC: 31.9 g/dL (ref 30.0–36.0)
MCV: 98 fL (ref 78.0–100.0)
MONO ABS: 0.6 10*3/uL (ref 0.1–1.0)
MONOS PCT: 11 % (ref 3–12)
NEUTROS ABS: 3.4 10*3/uL (ref 1.7–7.7)
NEUTROS PCT: 58 % (ref 43–77)
Platelets: 120 10*3/uL — ABNORMAL LOW (ref 150–400)
RBC: 2.94 MIL/uL — ABNORMAL LOW (ref 4.22–5.81)
RDW: 19.6 % — ABNORMAL HIGH (ref 11.5–15.5)
WBC: 5.7 10*3/uL (ref 4.0–10.5)

## 2014-03-03 LAB — FERRITIN: Ferritin: 1098 ng/mL — ABNORMAL HIGH (ref 22–322)

## 2014-03-03 MED ORDER — DARBEPOETIN ALFA 60 MCG/0.3ML IJ SOSY
PREFILLED_SYRINGE | INTRAMUSCULAR | Status: AC
  Filled 2014-03-03: qty 0.3

## 2014-03-03 MED ORDER — DARBEPOETIN ALFA 60 MCG/0.3ML IJ SOSY: 60.0000 ug | PREFILLED_SYRINGE | Freq: Once | INTRAMUSCULAR | Status: DC

## 2014-03-03 MED ORDER — DARBEPOETIN ALFA 60 MCG/0.3ML IJ SOSY
60.0000 ug | PREFILLED_SYRINGE | Freq: Once | INTRAMUSCULAR | Status: AC
  Administered 2014-03-03: 60 ug via INTRAVENOUS

## 2014-03-03 NOTE — Progress Notes (Signed)
[name] presented for labwork. Labs per MD order drawn via Peripheral Line 23 gauge needle inserted in right AC  Good blood return present. Procedure without incident.  Needle removed intact. Patient tolerated procedure well.   [name] presents today for injection per MD orders. Aranesp 51mcg administered SQ in left Abdomen. Administration without incident. Patient tolerated well.

## 2014-03-03 NOTE — Progress Notes (Signed)
See office encounter for information.

## 2014-03-03 NOTE — Patient Instructions (Signed)
[place] at [facility] Discharge Instructions  RECOMMENDATIONS MADE BY THE CONSULTANT AND ANY TEST RESULTS WILL BE SENT TO YOUR REFERRING PHYSICIAN.  Discussion by Dr. Whitney Muse.  You are not making enough blood and we will continue with the aranesp injections.  Will try to keep your blood counts at a reasonable level. Call with any issues or concerns.  Labs and Aranesp every 2 weeks Office visit in 8 weeks.  Thank you for choosing [place] at [facility] to provide your oncology and hematology care.  To afford each patient quality time with our provider, please arrive at least 15 minutes before your scheduled appointment time.    You need to re-schedule your appointment should you arrive 10 or more minutes late.  We strive to give you quality time with our providers, and arriving late affects you and other patients whose appointments are after yours.  Also, if you no show three or more times for appointments you may be dismissed from the clinic at the providers discretion.     Again, thank you for choosing [facility].  Our hope is that these requests will decrease the amount of time that you wait before being seen by our physicians.       _____________________________________________________________  Should you have questions after your visit to [facility], please contact our office at [phone] between the hours of 8:30 a.m. and 4:30 p.m.  Voicemails left after 4:30 p.m. will not be returned until the following business day.  For prescription refill requests, have your pharmacy contact our office.

## 2014-03-03 NOTE — Progress Notes (Signed)
James Noble, MD [address] [place] [address]  DIAGNOSIS:  Anemia of chronic kidney disease, CKD Stage V Iron deficiency, hemoccult positive stool Low folate History of PRBC transfusion SPEP/IEP negative  CURRENT THERAPY:  INTERVAL HISTORY: [name] 81 y.o. male returns for follow-up of anemia felt to be secondary to chronic kidney disease. He has also been treated for iron deficiency. Hemoglobin typically ranges in the low 9 g/dL region. Because of his age and other comorbidities he does not wish to proceed with more aggressive workup. He has a history of low folic acid and does take folic acid supplementation. As no major complaints today. He is with family.   MEDICAL HISTORY: Past Medical History  Diagnosis Date  . Gout   . Stroke 1993, 1995    chronic balance issues  . Hyperlipidemia   . Emphysema   . Hypertension   . CAD (coronary artery disease)     stent 2006  . Seizure disorder   . GERD (gastroesophageal reflux disease)   . Cancer   . Asthma   . Syncope Jan. 1, 2014  . AAA (abdominal aortic aneurysm)   . Sinus node dysfunction     s/p PPM implant April 2015 (ST Jude)  . Pneumonia   . Myocardial infarction   . CHF (congestive heart failure)   . Seizures   . Renal disorder   . CKD (chronic kidney disease) stage 4, GFR 15-29 ml/min 04/17/2013    has HYPERLIPIDEMIA; GOUT; HYPERTENSION; CORONARY ATHEROSCLEROSIS NATIVE CORONARY ARTERY; STROKE; CHRONIC RHINITIS; EMPHYSEMA; C O P D gold stage A; GERD; CHEST PAIN; Difficulty in walking; Abnormality of gait; Orthostatic hypotension; AAA (abdominal aortic aneurysm); Abdominal aneurysm without mention of rupture; Constipation; DDD (degenerative disc disease), lumbosacral; Neuropathy; Leg pain; Lumbar herniated disc; Acute lacunar infarction; Pedal edema; Chronic constipation; Aftercare following surgery of the circulatory system, NEC; Syncope; CHF (congestive heart failure); Pneumonitis;  Thrombocytopenia; CKD (chronic kidney disease) stage 4, GFR 15-29 ml/min; Hx of TIA (transient ischemic attack) and stroke; Acute CHF; Bradycardia with documented sinus pauses; LBBB (left bundle branch block); Sinus arrest; Atherosclerosis of native arteries of the extremities with intermittent claudication; Peripheral vascular disease, unspecified; Acute kidney injury; Anemia; Hemoptysis; Symptomatic anemia; Occult GI bleeding; Anemia in stage 5 chronic kidney disease; and Acute renal failure on his problem list.     is allergic to penicillins.  [name] had no medications administered during this visit.  SURGICAL HISTORY: Past Surgical History  Procedure Laterality Date  . Cardiac catheterization  08/23/2004    bare metal stenting of the proximal circumflex  . Colonoscopy      X 2  . Colonoscopy  11/01/2011    Procedure: COLONOSCOPY;  Surgeon: Rogene Houston, MD;  [facility];  Service: Endoscopy;  Laterality: N/A;  100  . Eye surgery    . Nose surgery    . Pacemaker insertion  04-29-13    STJ Assurity dual chamber pacemaker implanted by Dr Rayann Heman for syncope and sinus node dysfunction  . Esophagogastroduodenoscopy N/A 12/16/2013    Procedure: ESOPHAGOGASTRODUODENOSCOPY (EGD);  Surgeon: Rogene Houston, MD;  [facility];  Service: Endoscopy;  Laterality: N/A;  . Permanent pacemaker insertion N/A 04/29/2013    Procedure: PERMANENT PACEMAKER INSERTION;  Surgeon: Coralyn Mark, MD;  Location: [facility];  Service: Cardiovascular;  Laterality: N/A;  . Tongue biopsy Right 12/2013    SOCIAL HISTORY: History   Social History  . Marital Status:  Widowed    Spouse Name: N/A  . Number of Children: 2  . Years of Education: N/A   Occupational History  . Retired Multimedia programmer   Social History Main Topics  . Smoking status: Former Smoker -- 0.30 packs/day for 50 years    Types: Cigarettes    Quit date: 01/15/1990  . Smokeless tobacco: Never Used  . Alcohol  Use: No  . Drug Use: No  . Sexual Activity: Not Currently   Other Topics Concern  . Not on file   Social History Narrative    FAMILY HISTORY: Family History  Problem Relation Age of Onset  . Emphysema Brother   . Heart disease Sister   . Cancer Brother     lung  . Diabetes Brother   . Cancer Brother     throat  . Diabetes Brother   . Rheum arthritis Mother   . Arthritis Mother   . Rheum arthritis Father   . Colon cancer Neg Hx   . Diabetes    . Stroke Mother   . Heart attack    . Heart attack Sister     Review of Systems  Constitutional: Positive for malaise/fatigue.  HENT: Negative.   Eyes: Negative.   Respiratory: Positive for shortness of breath.   Cardiovascular: Positive for leg swelling.  Gastrointestinal: Negative.   Genitourinary: Positive for urgency.  Musculoskeletal: Positive for joint pain.  Neurological: Positive for weakness. Negative for dizziness, focal weakness, seizures and loss of consciousness.  Endo/Heme/Allergies: Negative.   Psychiatric/Behavioral: Negative.     PHYSICAL EXAMINATION  ECOG PERFORMANCE STATUS: 2 - Symptomatic, <50% confined to bed  There were no vitals filed for this visit.  Physical Exam  Constitutional: He is oriented to person, place, and time and well-developed, well-nourished, and in no distress.  Pleasant in wheelchair, exam is limited secondary to patient sitting in wheelchair, does not feel able to get onto exam table  HENT:  Head: Normocephalic and atraumatic.  Nose: Nose normal.  Mouth/Throat: Oropharynx is clear and moist. No oropharyngeal exudate.  Eyes: Conjunctivae and EOM are normal. Pupils are equal, round, and reactive to light. Right eye exhibits no discharge. Left eye exhibits no discharge. No scleral icterus.  Neck: Normal range of motion. Neck supple. No tracheal deviation present. No thyromegaly present.  Cardiovascular: Normal rate, regular rhythm and normal heart sounds.  Exam reveals no gallop  and no friction rub.   No murmur heard. Pulmonary/Chest: Effort normal and breath sounds normal. He has no wheezes. He has no rales.  Abdominal: Soft. Bowel sounds are normal. He exhibits no distension and no mass. There is no tenderness. There is no rebound and no guarding.  Musculoskeletal: Normal range of motion. He exhibits no edema.  Lymphadenopathy:    He has no cervical adenopathy.  Neurological: He is alert and oriented to person, place, and time. He has normal reflexes. No cranial nerve deficit.  Skin: Skin is warm and dry. No rash noted.  Psychiatric: Mood, memory, affect and judgment normal.  Nursing note and vitals reviewed.   LABORATORY DATA:  CBC    Component Value Date/Time   WBC 5.7 03/03/2014 1200   RBC 2.94* 03/03/2014 1200   RBC 3.05* 02/21/2014 1258   HGB 9.2* 03/03/2014 1200   HCT 28.8* 03/03/2014 1200   PLT 120* 03/03/2014 1200   MCV 98.0 03/03/2014 1200   MCH 31.3 03/03/2014 1200   MCHC 31.9 03/03/2014 1200   RDW 19.6* 03/03/2014 1200  LYMPHSABS 1.5 03/03/2014 1200   MONOABS 0.6 03/03/2014 1200   EOSABS 0.2 03/03/2014 1200   BASOSABS 0.0 03/03/2014 1200   CMP     Component Value Date/Time   NA 139 03/02/2014 0504   K 4.2 03/02/2014 0504   CL 105 03/02/2014 0504   CO2 26 03/02/2014 0504   GLUCOSE 92 03/02/2014 0504   BUN 56* 03/02/2014 0504   CREATININE 4.80* 03/02/2014 0504   CREATININE 1.39* 08/07/2011 0837   CALCIUM 8.5 03/02/2014 0504   PROT 5.4* 03/02/2014 0504   ALBUMIN 3.2* 03/02/2014 0504   AST 35 03/02/2014 0504   ALT 23 03/02/2014 0504   ALKPHOS 100 03/02/2014 0504   BILITOT 0.6 03/02/2014 0504   GFRNONAA 10* 03/02/2014 0504   GFRAA 11* 03/02/2014 0504      ASSESSMENT and THERAPY PLAN:    Anemia  81 year old male with multiple medical issues including anemia. He has significant chronic kidney disease. He has been treated for iron deficiency. He also has a history of folic acid deficiency. He and his family are not  interested in a more aggressive anemia evaluation and currently they desire to remain on ESA support. He will also receive iron as needed. We will recommend ongoing observation continuing forward with the plan as detailed. If they develop any interim problems or concerns they are advised to let us know.   All questions were answered. The patient knows to call the clinic with any problems, questions or concerns. We can certainly see the patient much sooner if necessary. This note was electronically signed. Molli Hazard 03/03/2014

## 2014-03-04 NOTE — H&P (Signed)
NAMEDAVARI, LOPES NO.:  [id number]  MEDICAL RECORD NO.:  [id number]  LOCATION:  V956                          FACILITY:  [facility]  PHYSICIAN:  Paula Compton. Willey Blade, MD       DATE OF BIRTH:  [birth year]  DATE OF ADMISSION:  02/25/2014 DATE OF DISCHARGE:  LH                             HISTORY & PHYSICAL   HISTORY OF PRESENT ILLNESS:  This patient is a 81 year old male who was transferred from [facility].  He had been treated for weakness and possible pneumonia.  He had received cefepime and metronidazole intravenously at the hospital and was transitioned to cefuroxime at discharge.  He has not had fever.  He had experienced progressive deterioration in his renal function over the past year.  He now has stage 4 chronic kidney disease with a creatinine at 4.8.  His decision has been to forego hemodialysis.  He has had chronic diastolic heart failure which has been stable on diuretic therapy with torsemide. He has had anemia with chronic occult GI blood loss.  He had consultation with Gastroenterology while hospitalized.  He has required transfusion support.  He has also been treated with iron, Aranesp, vitamin L87, and folic acid.  He has had a thrombocytopenia with a drop in the hospital to 59,000, but with an improvement to 115,000 since discharge.  He has had chronic nausea.  Transaminases have been normal. An ultrasound of the abdomen revealed no gallstones.  Liver and bile ducts were unremarkable.  He has now reached a point where he is really not able to ambulate or to care for himself in the home setting.  He had had care at SNF late last year, but has been discharged home.  He now states that he is feeling better.  He has been eating better at times.  PAST MEDICAL HISTORY:  Chronic diastolic heart failure, chronic kidney disease stage 4, COPD, type 2 diabetes, coronary artery disease, seizure disorder, gout, hypertension, hyperlipidemia, GERD,  abdominal aortic aneurysm, thrombocytopenia, stroke, chronic GI bleeding status post pacemaker placement, BPH.  MEDICATIONS: 1. Allopurinol 100 mg daily. 2. Cefuroxime 500 mg twice a day. 3. Melatonin 3 mg each night. 4. Famotidine 20 mg twice a day. 5. Fergon 324 mg daily. 6. Folic acid 1 mg daily. 7. Isosorbide mononitrate 30 mg 1/2 tablet daily. 8. Metoprolol 12.5 mg twice a day. 9. Sublingual nitroglycerin p.r.n. 10.Zofran 4 mg every 6 hours as needed. 11.Trileptal 300 mg twice a day. 12.MiraLax 17 g daily. 13.Pravastatin 40 mg daily. 14.Tamsulosin 0.4 mg daily. 15.Torsemide 20 mg daily. 16.Vitamin B12 1000 mcg every 30 days.  ALLERGIES:  PENICILLIN.  SOCIAL HISTORY:  He does not use tobacco, alcohol, or recreational substances.  FAMILY HISTORY:  His mother died of stroke at 49, his father died of stroke at 38.  Two sisters have had diabetes.  REVIEW OF SYSTEMS:  No fever, chest pain, syncope, vomiting, diarrhea, or dysuria.  PHYSICAL EXAMINATION:  GENERAL:  Alert, breathing comfortably, in no distress. HEENT:  No scleral icterus.  Pharynx appears moist. NECK:  Supple with no lymphadenopathy or thyromegaly. LUNGS:  Clear. HEART:  Regular with  occasional ectopy. ABDOMEN:  Soft and nontender with no palpable organomegaly. EXTREMITIES:  Normal pulses with no edema. NEUROLOGIC:  No focal weakness. SKIN:  Warm and dry.  IMPRESSION/PLAN: 1. Pneumonia, finish cefuroxime. 2. Chronic kidney disease, stage 4. 3. Chronic gastrointestinal bleeding. 4. Anemia of chronic disease/iron deficiency. 5. Thrombocytopenia, improved. 6. Seizure disorder.  Continue Trileptal. 7. Coronary artery disease status post circumflex stent in 2006.     Continue medical therapy. 8. Stroke.  He is off aspirin because of gastrointestinal bleeding. 9. Status post pacemaker placement. 90.XYBFXOV diastolic heart failure.  Continue torsemide. 11.Benign prostatic hypertrophy.  Continue  tamsulosin.     Paula Compton. Willey Blade, MD     ROF/MEDQ  D:  03/03/2014  T:  03/04/2014  Job:  [id number]

## 2014-03-12 ENCOUNTER — Ambulatory Visit (HOSPITAL_COMMUNITY)
Admission: RE | Admit: 2014-03-12 | Discharge: 2014-03-12 | Disposition: A | Discharge status: Home / Self Care | Payer: No Typology Code available for payment source | Source: Ambulatory Visit | Attending: Internal Medicine | Admitting: Internal Medicine

## 2014-03-12 ENCOUNTER — Other Ambulatory Visit (HOSPITAL_COMMUNITY): Payer: Self-pay | Admitting: Internal Medicine

## 2014-03-12 DIAGNOSIS — R0989 Other specified symptoms and signs involving the circulatory and respiratory systems: Secondary | ICD-10-CM | POA: Insufficient documentation

## 2014-03-12 DIAGNOSIS — J189 Pneumonia, unspecified organism: Secondary | ICD-10-CM

## 2014-03-12 DIAGNOSIS — R05 Cough: Secondary | ICD-10-CM | POA: Insufficient documentation

## 2014-03-12 DIAGNOSIS — I10 Essential (primary) hypertension: Secondary | ICD-10-CM | POA: Insufficient documentation

## 2014-03-14 ENCOUNTER — Inpatient Hospital Stay (HOSPITAL_COMMUNITY): Payer: Medicare Other | Attending: Pulmonary Disease

## 2014-03-14 ENCOUNTER — Other Ambulatory Visit (HOSPITAL_COMMUNITY): Payer: Medicare Other

## 2014-03-14 NOTE — Progress Notes (Signed)
i was asked  to assess him while I was rounding at the SNF. He is complaining of  Abdominal pain. No N/V/D. He has been constipated.   He looks mildly uncomfortable. His abdomen is mildly distended and he is mildly diffusely tender.   This may be related to constipation. Check abdominal film then treat based on results

## 2014-03-17 ENCOUNTER — Encounter (HOSPITAL_COMMUNITY): Payer: Self-pay

## 2014-03-17 ENCOUNTER — Encounter (HOSPITAL_COMMUNITY): Payer: Medicare Other | Attending: Hematology and Oncology

## 2014-03-17 ENCOUNTER — Encounter (HOSPITAL_BASED_OUTPATIENT_CLINIC_OR_DEPARTMENT_OTHER): Payer: Medicare Other

## 2014-03-17 DIAGNOSIS — D631 Anemia in chronic kidney disease: Secondary | ICD-10-CM

## 2014-03-17 DIAGNOSIS — N400 Enlarged prostate without lower urinary tract symptoms: Secondary | ICD-10-CM | POA: Insufficient documentation

## 2014-03-17 DIAGNOSIS — N185 Chronic kidney disease, stage 5: Secondary | ICD-10-CM

## 2014-03-17 DIAGNOSIS — F0390 Unspecified dementia without behavioral disturbance: Secondary | ICD-10-CM | POA: Insufficient documentation

## 2014-03-17 DIAGNOSIS — K589 Irritable bowel syndrome without diarrhea: Secondary | ICD-10-CM | POA: Insufficient documentation

## 2014-03-17 DIAGNOSIS — I10 Essential (primary) hypertension: Secondary | ICD-10-CM | POA: Insufficient documentation

## 2014-03-17 LAB — CBC
HEMATOCRIT: 26.6 % — AB (ref 39.0–52.0)
Hemoglobin: 8.8 g/dL — ABNORMAL LOW (ref 13.0–17.0)
MCH: 32.2 pg (ref 26.0–34.0)
MCHC: 33.1 g/dL (ref 30.0–36.0)
MCV: 97.4 fL (ref 78.0–100.0)
Platelets: 92 10*3/uL — ABNORMAL LOW (ref 150–400)
RBC: 2.73 MIL/uL — AB (ref 4.22–5.81)
RDW: 19.7 % — AB (ref 11.5–15.5)
WBC: 5.4 10*3/uL (ref 4.0–10.5)

## 2014-03-17 MED ORDER — DARBEPOETIN ALFA 60 MCG/0.3ML IJ SOSY
60.0000 ug | PREFILLED_SYRINGE | Freq: Once | INTRAMUSCULAR | Status: AC
  Administered 2014-03-17: 60 ug via SUBCUTANEOUS

## 2014-03-17 MED ORDER — DARBEPOETIN ALFA 60 MCG/0.3ML IJ SOSY
PREFILLED_SYRINGE | INTRAMUSCULAR | Status: AC
  Filled 2014-03-17: qty 0.3

## 2014-03-17 NOTE — Progress Notes (Signed)
LABS FOR CBC 

## 2014-03-17 NOTE — Progress Notes (Signed)
aranesp 60 mcg given in abdomen sq

## 2014-03-17 NOTE — Patient Instructions (Signed)
[place] at [facility]  Discharge Instructions:  Aranesp today.  Follow up as scheduled.  Call the clinic if you have any questions or concerns _______________________________________________________________  Thank you for choosing [place] at [facility] to provide your oncology and hematology care.  To afford each patient quality time with our providers, please arrive at least 15 minutes before your scheduled appointment.  You need to re-schedule your appointment if you arrive 10 or more minutes late.  We strive to give you quality time with our providers, and arriving late affects you and other patients whose appointments are after yours.  Also, if you no show three or more times for appointments you may be dismissed from the clinic.  Again, thank you for choosing [facility] hope is that these requests will allow you access to exceptional care and in a timely manner. _______________________________________________________________  If you have questions after your visit, please contact our office at [phone] between the hours of 8:30 a.m. and 5:00 p.m. Voicemails left after 4:30 p.m. will not be returned until the following business day. _______________________________________________________________  For prescription refill requests, have your pharmacy contact our office. _______________________________________________________________  Recommendations made by the consultant and any test results will be sent to your referring physician. _______________________________________________________________

## 2014-03-17 NOTE — Progress Notes (Signed)
[name] presents  today for his Aranesp injection per MD orders. Aranesp  administered SQ in left Abdomen. Administration without incident. Patient tolerated well.

## 2014-03-19 ENCOUNTER — Other Ambulatory Visit (HOSPITAL_COMMUNITY)
Admission: AD | Admit: 2014-03-19 | Discharge: 2014-03-19 | Disposition: A | Discharge status: Home / Self Care | Payer: Medicare Other | Source: Skilled Nursing Facility | Attending: Internal Medicine | Admitting: Internal Medicine

## 2014-03-19 LAB — BASIC METABOLIC PANEL
ANION GAP: 12 (ref 5–15)
BUN: 79 mg/dL — AB (ref 6–23)
CALCIUM: 8.9 mg/dL (ref 8.4–10.5)
CO2: 28 mmol/L (ref 19–32)
CREATININE: 5.5 mg/dL — AB (ref 0.50–1.35)
Chloride: 98 mmol/L (ref 96–112)
GFR calc Af Amer: 9 mL/min — ABNORMAL LOW (ref >90)
GFR calc non Af Amer: 8 mL/min — ABNORMAL LOW (ref >90)
Glucose, Bld: 100 mg/dL — ABNORMAL HIGH (ref 70–99)
Potassium: 3.7 mmol/L (ref 3.5–5.1)
Sodium: 138 mmol/L (ref 135–145)

## 2014-03-21 ENCOUNTER — Other Ambulatory Visit (HOSPITAL_COMMUNITY)
Admission: RE | Admit: 2014-03-21 | Discharge: 2014-03-21 | Disposition: A | Discharge status: Home / Self Care | Payer: Medicare Other | Source: Other Acute Inpatient Hospital | Attending: Internal Medicine | Admitting: Internal Medicine

## 2014-03-21 LAB — COMPREHENSIVE METABOLIC PANEL
ALT: 29 U/L (ref 0–53)
ANION GAP: 11 (ref 5–15)
AST: 32 U/L (ref 0–37)
Albumin: 3.2 g/dL — ABNORMAL LOW (ref 3.5–5.2)
Alkaline Phosphatase: 141 U/L — ABNORMAL HIGH (ref 39–117)
BILIRUBIN TOTAL: 0.6 mg/dL (ref 0.3–1.2)
BUN: 74 mg/dL — ABNORMAL HIGH (ref 6–23)
CO2: 28 mmol/L (ref 19–32)
Calcium: 8.6 mg/dL (ref 8.4–10.5)
Chloride: 100 mmol/L (ref 96–112)
Creatinine, Ser: 5.06 mg/dL — ABNORMAL HIGH (ref 0.50–1.35)
GFR calc Af Amer: 10 mL/min — ABNORMAL LOW (ref >90)
GFR calc non Af Amer: 9 mL/min — ABNORMAL LOW (ref >90)
GLUCOSE: 98 mg/dL (ref 70–99)
Potassium: 3.5 mmol/L (ref 3.5–5.1)
Sodium: 139 mmol/L (ref 135–145)
Total Protein: 5.7 g/dL — ABNORMAL LOW (ref 6.0–8.3)

## 2014-03-22 ENCOUNTER — Emergency Department (HOSPITAL_COMMUNITY): Payer: Medicare Other

## 2014-03-22 ENCOUNTER — Encounter (HOSPITAL_COMMUNITY)
Admission: RE | Admit: 2014-03-22 | Discharge: 2014-03-22 | Disposition: A | Discharge status: Home / Self Care | Payer: Medicare Other | Source: Skilled Nursing Facility | Attending: Internal Medicine | Admitting: Internal Medicine

## 2014-03-22 ENCOUNTER — Emergency Department (HOSPITAL_COMMUNITY)
Admission: EM | Admit: 2014-03-22 | Discharge: 2014-03-22 | Disposition: A | Discharge status: Home / Self Care | Payer: Medicare Other | Attending: Emergency Medicine | Admitting: Emergency Medicine

## 2014-03-22 ENCOUNTER — Encounter (HOSPITAL_COMMUNITY): Payer: Self-pay

## 2014-03-22 DIAGNOSIS — S0990XA Unspecified injury of head, initial encounter: Secondary | ICD-10-CM | POA: Diagnosis not present

## 2014-03-22 DIAGNOSIS — I509 Heart failure, unspecified: Secondary | ICD-10-CM | POA: Insufficient documentation

## 2014-03-22 DIAGNOSIS — S50812A Abrasion of left forearm, initial encounter: Secondary | ICD-10-CM | POA: Diagnosis not present

## 2014-03-22 DIAGNOSIS — I2510 Atherosclerotic heart disease of native coronary artery without angina pectoris: Secondary | ICD-10-CM | POA: Insufficient documentation

## 2014-03-22 DIAGNOSIS — J45909 Unspecified asthma, uncomplicated: Secondary | ICD-10-CM | POA: Insufficient documentation

## 2014-03-22 DIAGNOSIS — Y9289 Other specified places as the place of occurrence of the external cause: Secondary | ICD-10-CM | POA: Insufficient documentation

## 2014-03-22 DIAGNOSIS — Z9889 Other specified postprocedural states: Secondary | ICD-10-CM | POA: Diagnosis not present

## 2014-03-22 DIAGNOSIS — Z8673 Personal history of transient ischemic attack (TIA), and cerebral infarction without residual deficits: Secondary | ICD-10-CM | POA: Diagnosis not present

## 2014-03-22 DIAGNOSIS — W06XXXA Fall from bed, initial encounter: Secondary | ICD-10-CM | POA: Insufficient documentation

## 2014-03-22 DIAGNOSIS — Z79899 Other long term (current) drug therapy: Secondary | ICD-10-CM | POA: Diagnosis not present

## 2014-03-22 DIAGNOSIS — Z880 Allergy status to penicillin: Secondary | ICD-10-CM | POA: Diagnosis not present

## 2014-03-22 DIAGNOSIS — I252 Old myocardial infarction: Secondary | ICD-10-CM | POA: Diagnosis not present

## 2014-03-22 DIAGNOSIS — W19XXXA Unspecified fall, initial encounter: Secondary | ICD-10-CM

## 2014-03-22 DIAGNOSIS — Z8701 Personal history of pneumonia (recurrent): Secondary | ICD-10-CM | POA: Insufficient documentation

## 2014-03-22 DIAGNOSIS — I129 Hypertensive chronic kidney disease with stage 1 through stage 4 chronic kidney disease, or unspecified chronic kidney disease: Secondary | ICD-10-CM | POA: Insufficient documentation

## 2014-03-22 DIAGNOSIS — S51812A Laceration without foreign body of left forearm, initial encounter: Secondary | ICD-10-CM

## 2014-03-22 DIAGNOSIS — J439 Emphysema, unspecified: Secondary | ICD-10-CM | POA: Diagnosis not present

## 2014-03-22 DIAGNOSIS — S8012XA Contusion of left lower leg, initial encounter: Secondary | ICD-10-CM | POA: Diagnosis not present

## 2014-03-22 DIAGNOSIS — Z87891 Personal history of nicotine dependence: Secondary | ICD-10-CM | POA: Diagnosis not present

## 2014-03-22 DIAGNOSIS — M109 Gout, unspecified: Secondary | ICD-10-CM | POA: Diagnosis not present

## 2014-03-22 DIAGNOSIS — Y998 Other external cause status: Secondary | ICD-10-CM | POA: Diagnosis not present

## 2014-03-22 DIAGNOSIS — N184 Chronic kidney disease, stage 4 (severe): Secondary | ICD-10-CM | POA: Diagnosis not present

## 2014-03-22 DIAGNOSIS — Z8719 Personal history of other diseases of the digestive system: Secondary | ICD-10-CM | POA: Insufficient documentation

## 2014-03-22 DIAGNOSIS — Y9389 Activity, other specified: Secondary | ICD-10-CM | POA: Insufficient documentation

## 2014-03-22 LAB — BASIC METABOLIC PANEL
ANION GAP: 9 (ref 5–15)
BUN: 74 mg/dL — AB (ref 6–23)
CHLORIDE: 101 mmol/L (ref 96–112)
CO2: 30 mmol/L (ref 19–32)
Calcium: 8.7 mg/dL (ref 8.4–10.5)
Creatinine, Ser: 5.02 mg/dL — ABNORMAL HIGH (ref 0.50–1.35)
GFR calc Af Amer: 11 mL/min — ABNORMAL LOW (ref >90)
GFR calc non Af Amer: 9 mL/min — ABNORMAL LOW (ref >90)
Glucose, Bld: 113 mg/dL — ABNORMAL HIGH (ref 70–99)
POTASSIUM: 3.7 mmol/L (ref 3.5–5.1)
Sodium: 140 mmol/L (ref 135–145)

## 2014-03-22 NOTE — ED Notes (Signed)
Patient verbalizes understanding of discharge instructions, home care and follow up care. Patient back to [facility] center via wheelchair at this time.

## 2014-03-22 NOTE — ED Provider Notes (Signed)
CSN: [id number]     Arrival date & time 03/22/14  0353 History   First MD Initiated Contact with Patient 03/22/14 0354     Chief Complaint  Patient presents with  . Fall      HPI Pt presents from nursing home after a fall tonight. He stated he had to urinate and no one came to help him up of of bed, thus he tried to get out of bed and urinate on his own. He reports he fell forward, injury his head, left elbow and left lower leg. No recent illness. Reports mild HA at this time. No use of anticoagulants. Denies CP or SOB. No abd pain. Pain is only mild in severity. Pt does not want anything for pain at this time   Past Medical History  Diagnosis Date  . Gout   . Stroke 1993, 1995    chronic balance issues  . Hyperlipidemia   . Emphysema   . Hypertension   . CAD (coronary artery disease)     stent 2006  . Seizure disorder   . GERD (gastroesophageal reflux disease)   . Cancer   . Asthma   . Syncope Jan. 1, 2014  . AAA (abdominal aortic aneurysm)   . Sinus node dysfunction     s/p PPM implant April 2015 (ST Jude)  . Pneumonia   . Myocardial infarction   . CHF (congestive heart failure)   . Seizures   . Renal disorder   . CKD (chronic kidney disease) stage 4, GFR 15-29 ml/min 04/17/2013   Past Surgical History  Procedure Laterality Date  . Cardiac catheterization  08/23/2004    bare metal stenting of the proximal circumflex  . Colonoscopy      X 2  . Colonoscopy  11/01/2011    Procedure: COLONOSCOPY;  Surgeon: Rogene Houston, MD;  Location: [facility];  Service: Endoscopy;  Laterality: N/A;  100  . Eye surgery    . Nose surgery    . Pacemaker insertion  04-29-13    STJ Assurity dual chamber pacemaker implanted by Dr Rayann Heman for syncope and sinus node dysfunction  . Esophagogastroduodenoscopy N/A 12/16/2013    Procedure: ESOPHAGOGASTRODUODENOSCOPY (EGD);  Surgeon: Rogene Houston, MD;  [facility]: [facility];  Service: Endoscopy;  Laterality: N/A;  . Permanent pacemaker  insertion N/A 04/29/2013    Procedure: PERMANENT PACEMAKER INSERTION;  Surgeon: Coralyn Mark, MD;  Location: [facility] CATH LAB;  Service: Cardiovascular;  Laterality: N/A;  . Tongue biopsy Right 12/2013   Family History  Problem Relation Age of Onset  . Emphysema Brother   . Heart disease Sister   . Cancer Brother     lung  . Diabetes Brother   . Cancer Brother     throat  . Diabetes Brother   . Rheum arthritis Mother   . Arthritis Mother   . Rheum arthritis Father   . Colon cancer Neg Hx   . Diabetes    . Stroke Mother   . Heart attack    . Heart attack Sister    History  Substance Use Topics  . Smoking status: Former Smoker -- 0.30 packs/day for 50 years    Types: Cigarettes    Quit date: 01/15/1990  . Smokeless tobacco: Never Used  . Alcohol Use: No    Review of Systems  All other systems reviewed and are negative.     Allergies  Penicillins  Home Medications   Prior to Admission medications   Medication  Sig Start Date End Date Taking? Authorizing Provider  allopurinol (ZYLOPRIM) 100 MG tablet Take 1 tablet (100 mg total) by mouth every morning. 10/28/13   Asencion Noble, MD  cefUROXime (CEFTIN) 500 MG tablet Take 1 tablet (500 mg total) by mouth every morning. 02/25/14   Asencion Noble, MD  cyanocobalamin (,VITAMIN B-12,) 1000 MCG/ML injection Inject 1,000 mcg into the muscle every 30 (thirty) days.    Historical Provider, MD  famotidine (PEPCID) 20 MG tablet TAKE ONE TABLET BY MOUTH TWICE DAILY. 12/22/13   Elsie Stain, MD  ferrous gluconate (FERGON) 324 MG tablet Take 1 tablet (324 mg total) by mouth daily with breakfast. Patient not taking: Reported on 03/03/2014 12/17/13   Asencion Noble, MD  folic acid (FOLVITE) 1 MG tablet Take 1 tablet (1 mg total) by mouth daily. 02/25/14   Asencion Noble, MD  isosorbide mononitrate (IMDUR) 30 MG 24 hr tablet Take 0.5 tablets (15 mg total) by mouth daily. 08/26/13   Liliane Shi, PA-C  Melatonin ([employer] MELATONIN) 3 MG TABS Take 3 mg by mouth at  bedtime.    Historical Provider, MD  metoprolol tartrate (LOPRESSOR) 25 MG tablet TAKE ONE HALF TABLET BY MOUTH TWICE DAILY. 02/22/14   Arnoldo Lenis, MD  nitroGLYCERIN (NITROSTAT) 0.4 MG SL tablet Place 1 tablet (0.4 mg total) under the tongue every 5 (five) minutes as needed for chest pain. 1 tablet under tongue at onset of chest pain;you may repeat every 5 minutes for up to 3 doses. 10/03/12   Herminio Commons, MD  ondansetron (ZOFRAN) 4 MG tablet Take 1 tablet (4 mg total) by mouth every 6 (six) hours as needed for nausea. 02/25/14   Asencion Noble, MD  Oxcarbazepine (TRILEPTAL) 300 MG tablet Take 300 mg by mouth 2 (two) times daily.     Historical Provider, MD  polyethylene glycol powder (GLYCOLAX/MIRALAX) powder Take 17 g by mouth daily. *Mixed in 4-8 ounces of liquid and drink daily    Historical Provider, MD  pravastatin (PRAVACHOL) 40 MG tablet Take 40 mg by mouth every evening.     Historical Provider, MD  Tamsulosin HCl (FLOMAX) 0.4 MG CAPS Take 0.4 mg by mouth at bedtime.     Historical Provider, MD  torsemide (DEMADEX) 20 MG tablet Take 1 tablet (20 mg total) by mouth daily. Patient taking differently: Take 40 mg by mouth daily.  10/28/13   Asencion Noble, MD   BP 112/69 mmHg  Pulse 78  Temp(Src) 97.5 F (36.4 C) (Oral)  Resp 20  Ht 5' 11.5" (1.816 m)  Wt 186 lb (84.369 kg)  BMI 25.58 kg/m2  SpO2 94% Physical Exam  Constitutional: He is oriented to person, place, and time. He appears well-developed and well-nourished.  HENT:  Head: Normocephalic and atraumatic.  Eyes: EOM are normal.  Neck: Normal range of motion. Neck supple.  c spine nontender  Cardiovascular: Normal rate, regular rhythm, normal heart sounds and intact distal pulses.   Pulmonary/Chest: Effort normal and breath sounds normal. No respiratory distress.  Abdominal: Soft. He exhibits no distension. There is no tenderness.  Musculoskeletal: Normal range of motion.  Full ROM of bilateral shoulder, elbows, wrists.  Small skin tear to left posterior proximal forearm. Normal left radial pulse. Full ROM of bilateral hips, ankles, and knees. Mild tenderness, bruising and swelling of the lateral proximal left tibia  Neurological: He is alert and oriented to person, place, and time.  Skin: Skin is warm and dry.  Psychiatric: He has a normal  mood and affect. Judgment normal.  Nursing note and vitals reviewed.   ED Course  Procedures (including critical care time) Labs Review Labs Reviewed - No data to display  Imaging Review Dg Tibia/fibula Left  03/22/2014   CLINICAL DATA:  [place] [address] going to bathroom onto hard floor. Acute injury, initial evaluation.  EXAM: LEFT TIBIA AND FIBULA - 2 VIEW  COMPARISON:  None.  FINDINGS: There is no evidence of fracture or other focal bone lesions. Moderate vascular calcifications. No subcutaneous gas or radiopaque foreign bodies.  IMPRESSION: No acute osseous process.   Electronically Signed   By: Elon Alas   On: 03/22/2014 05:06   Ct Head Wo Contrast  03/22/2014   CLINICAL DATA:  81 year old male post fall going to the bathroom, fall onto hard surface floor. Posterior and left parietal scalp pain and headache.  EXAM: CT HEAD WITHOUT CONTRAST  TECHNIQUE: Contiguous axial images were obtained from the base of the skull through the vertex without intravenous contrast.  COMPARISON:  None.  FINDINGS: Generalized atrophy and chronic motor small vessel ischemia. Remote lacunar infarct in the right caudate. No intracranial hemorrhage. No subdural or extra-axial fluid collection. No acute territorial infarct. The basilar cisterns are patent. There is no calvarial fracture. Paranasal sinuses and mastoid air cells are well aerated.  IMPRESSION: 1.  No acute intracranial abnormality. 2. Atrophy, chronic small vessel ischemia, and remote lacunar infarct in the right caudate.   Electronically Signed   By: Jeb Levering M.D.   On: 03/22/2014 05:18  I personally reviewed the imaging tests  through PACS system I reviewed available ER/hospitalization records through the EMR    EKG Interpretation None      MDM   Final diagnoses:  Fall, initial encounter  Minor head injury, initial encounter  Contusion of left lower leg, initial encounter  Skin tear of left forearm without complication, initial encounter    Mechanical fall. Full ROM of bilateral hips. Head ct and left tibia xray pending. Pt does not want anything for pain at this time. Normal state of health for the patient today per patient    Hoy Morn, MD 03/22/14 [phone]

## 2014-03-22 NOTE — ED Notes (Signed)
Patient from [facility] after a fall. Patient states he was trying to get up to go to the restroom. Patient states pain to left side of head, hip, knee.

## 2014-03-23 ENCOUNTER — Other Ambulatory Visit (HOSPITAL_COMMUNITY): Payer: Self-pay | Admitting: Hematology & Oncology

## 2014-03-26 ENCOUNTER — Other Ambulatory Visit (HOSPITAL_COMMUNITY)
Admission: RE | Admit: 2014-03-26 | Discharge: 2014-03-26 | Disposition: A | Discharge status: Home / Self Care | Payer: Medicare Other | Source: Other Acute Inpatient Hospital | Attending: Internal Medicine | Admitting: Internal Medicine

## 2014-03-26 LAB — BASIC METABOLIC PANEL
ANION GAP: 12 (ref 5–15)
BUN: 73 mg/dL — ABNORMAL HIGH (ref 6–23)
CALCIUM: 8.9 mg/dL (ref 8.4–10.5)
CHLORIDE: 100 mmol/L (ref 96–112)
CO2: 28 mmol/L (ref 19–32)
CREATININE: 4.92 mg/dL — AB (ref 0.50–1.35)
GFR, EST AFRICAN AMERICAN: 11 mL/min — AB (ref >90)
GFR, EST NON AFRICAN AMERICAN: 9 mL/min — AB (ref >90)
Glucose, Bld: 185 mg/dL — ABNORMAL HIGH (ref 70–99)
Potassium: 3.7 mmol/L (ref 3.5–5.1)
Sodium: 140 mmol/L (ref 135–145)

## 2014-03-29 ENCOUNTER — Ambulatory Visit (HOSPITAL_COMMUNITY)
Admission: RE | Admit: 2014-03-29 | Discharge: 2014-03-29 | Disposition: A | Discharge status: Home / Self Care | Payer: Medicare Other | Source: Ambulatory Visit | Attending: Internal Medicine | Admitting: Internal Medicine

## 2014-03-29 ENCOUNTER — Other Ambulatory Visit: Payer: Self-pay | Admitting: Internal Medicine

## 2014-03-29 DIAGNOSIS — R059 Cough, unspecified: Secondary | ICD-10-CM

## 2014-03-29 DIAGNOSIS — I700 Atherosclerosis of aorta: Secondary | ICD-10-CM | POA: Insufficient documentation

## 2014-03-29 DIAGNOSIS — J90 Pleural effusion, not elsewhere classified: Secondary | ICD-10-CM | POA: Insufficient documentation

## 2014-03-29 DIAGNOSIS — R05 Cough: Secondary | ICD-10-CM | POA: Insufficient documentation

## 2014-03-30 ENCOUNTER — Ambulatory Visit (INDEPENDENT_AMBULATORY_CARE_PROVIDER_SITE_OTHER): Payer: Medicare Other | Admitting: Internal Medicine

## 2014-03-30 NOTE — Progress Notes (Unsigned)
[name], [name] NO.:  [id number]  MEDICAL RECORD NO.:  [id number]  LOCATION:                                 FACILITY:  PHYSICIAN:  Paula Compton. Willey Blade, MD       DATE OF BIRTH:  May 19, 1921  DATE OF PROCEDURE:  03/30/2014 DATE OF DISCHARGE:  03/22/2014                                PROGRESS NOTE   SUBJECTIVE:  [name] has had progressive decline.  He has been generally more weak.  He denied increased shortness of breath, although he has had recent cough.  He denies fever.  He has been treated for heart failure further with an increased diuretic dose.  OBJECTIVE:  GENERAL:  Alert.  Breathing comfortably. HEENT:  Unremarkable. LUNGS:  Reveal bilateral rhonchi. HEART:  Regular with no murmurs. ABDOMEN:  Soft and nontender.  IMPRESSION: 1. Congestive heart failure.  Continue current diuretic dose. 2. Chronic kidney disease.  BUN and creatinine are stable at 73 and     4.92.  He declines dialysis.  Electrolytes are normal.  Bicarb is     normal. 3. Bilateral pleural effusions, right greater than left. 4. Diabetes, stable.  PLAN:  His situation has been discussed with his daughter.  We will pursue comfort care measures at this point with his progressive weakness.  Hospice has been consulted.     Paula Compton. Willey Blade, MD     ROF/MEDQ  D:  03/30/2014  T:  03/30/2014  Job:  [id number]

## 2014-03-31 ENCOUNTER — Encounter (HOSPITAL_COMMUNITY): Payer: Medicare Other | Attending: Hematology & Oncology

## 2014-03-31 ENCOUNTER — Encounter (HOSPITAL_COMMUNITY): Payer: Self-pay

## 2014-03-31 DIAGNOSIS — N185 Chronic kidney disease, stage 5: Secondary | ICD-10-CM | POA: Diagnosis not present

## 2014-03-31 DIAGNOSIS — D631 Anemia in chronic kidney disease: Secondary | ICD-10-CM | POA: Insufficient documentation

## 2014-03-31 LAB — CBC
HCT: 27.2 % — ABNORMAL LOW (ref 39.0–52.0)
Hemoglobin: 8.8 g/dL — ABNORMAL LOW (ref 13.0–17.0)
MCH: 32 pg (ref 26.0–34.0)
MCHC: 32.4 g/dL (ref 30.0–36.0)
MCV: 98.9 fL (ref 78.0–100.0)
PLATELETS: 95 10*3/uL — AB (ref 150–400)
RBC: 2.75 MIL/uL — AB (ref 4.22–5.81)
RDW: 19.6 % — ABNORMAL HIGH (ref 11.5–15.5)
WBC: 5.6 10*3/uL (ref 4.0–10.5)

## 2014-03-31 MED ORDER — DARBEPOETIN ALFA 100 MCG/0.5ML IJ SOSY
75.0000 ug | PREFILLED_SYRINGE | Freq: Once | INTRAMUSCULAR | Status: AC
  Administered 2014-03-31: 76 ug via SUBCUTANEOUS

## 2014-03-31 MED ORDER — DARBEPOETIN ALFA 100 MCG/0.5ML IJ SOSY
PREFILLED_SYRINGE | INTRAMUSCULAR | Status: AC
  Filled 2014-03-31: qty 0.5

## 2014-03-31 NOTE — Patient Instructions (Signed)
[place] at [facility]  Discharge Instructions:  You received aranesp 76 mcg today please follow up as scheduled.  Please call the clinic if you have any questions or concerns _______________________________________________________________  Thank you for choosing [place] at [facility] to provide your oncology and hematology care.  To afford each patient quality time with our providers, please arrive at least 15 minutes before your scheduled appointment.  You need to re-schedule your appointment if you arrive 10 or more minutes late.  We strive to give you quality time with our providers, and arriving late affects you and other patients whose appointments are after yours.  Also, if you no show three or more times for appointments you may be dismissed from the clinic.  Again, thank you for choosing [place] at [facility] hope is that these requests will allow you access to exceptional care and in a timely manner. _______________________________________________________________  If you have questions after your visit, please contact our office at [phone] between the hours of 8:30 a.m. and 5:00 p.m. Voicemails left after 4:30 p.m. will not be returned until the following business day. _______________________________________________________________  For prescription refill requests, have your pharmacy contact our office. _______________________________________________________________  Recommendations made by the consultant and any test results will be sent to your referring physician. _______________________________________________________________

## 2014-03-31 NOTE — Progress Notes (Signed)
Labs drawn

## 2014-03-31 NOTE — Progress Notes (Signed)
[name]'s reason for visit today is for an injection and labs as scheduled per MD orders.  Labs were drawn prior to administration of ordered medication.  [name] also received aranesp 76 mcg per MD orders; see [facility] for administration details.  [name] tolerated all procedures well and without incident; questions were answered and patient was discharged.

## 2014-04-01 DIAGNOSIS — N185 Chronic kidney disease, stage 5: Secondary | ICD-10-CM | POA: Diagnosis present

## 2014-04-01 DIAGNOSIS — D631 Anemia in chronic kidney disease: Secondary | ICD-10-CM | POA: Diagnosis not present

## 2014-04-14 ENCOUNTER — Encounter (HOSPITAL_COMMUNITY): Payer: Medicare Other

## 2014-04-16 DEATH — deceased

## 2014-04-28 ENCOUNTER — Ambulatory Visit (HOSPITAL_COMMUNITY): Payer: Medicare Other | Admitting: Hematology & Oncology

## 2014-04-28 ENCOUNTER — Encounter (HOSPITAL_COMMUNITY): Payer: Medicare Other

## 2014-05-11 ENCOUNTER — Telehealth: Payer: Self-pay | Admitting: Cardiology

## 2014-05-11 ENCOUNTER — Encounter: Payer: Medicare Other | Admitting: *Deleted

## 2014-05-11 NOTE — Telephone Encounter (Signed)
LMOVM reminding pt to send remote transmission.   

## 2014-05-12 ENCOUNTER — Encounter: Payer: Self-pay | Admitting: Cardiology

## 2014-05-24 ENCOUNTER — Telehealth: Payer: Self-pay | Admitting: Internal Medicine

## 2014-05-24 NOTE — Telephone Encounter (Signed)
New message      Family received a letter regarding checking pacemaker------pt died 2014-04-27 at [place]

## 2014-05-24 NOTE — Telephone Encounter (Signed)
Noted in paceart.  

## 2014-07-12 ENCOUNTER — Other Ambulatory Visit: Payer: Self-pay

## 2015-11-19 IMAGING — CT CT CHEST W/O CM
2 of 3 series · 15 of 36 positions shown, 18 images · non-contrast
Comparison: Multiple exams, including 10/20/2013

CLINICAL DATA: Hemoptysis. Emphysema. Coronary artery disease.
Hypertension. Stage IV chronic renal disease.

EXAM:
CT CHEST WITHOUT CONTRAST
TECHNIQUE: Multidetector CT imaging of the chest was performed following the
standard protocol without IV contrast..

[Series 2: chestroutine 5.0 b40f · axial · 0.74mm/px · z∈[-342,-56]mm · 12 of 69 slices shown, 15 images]
[im 6/69  mediastinal]
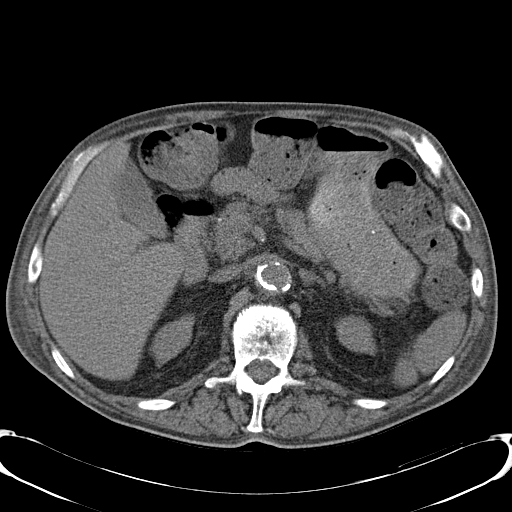
[im 6/69  lung]
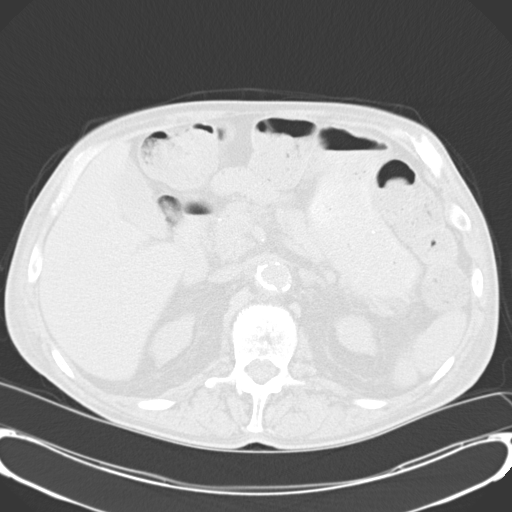
[im 11/69  lung]
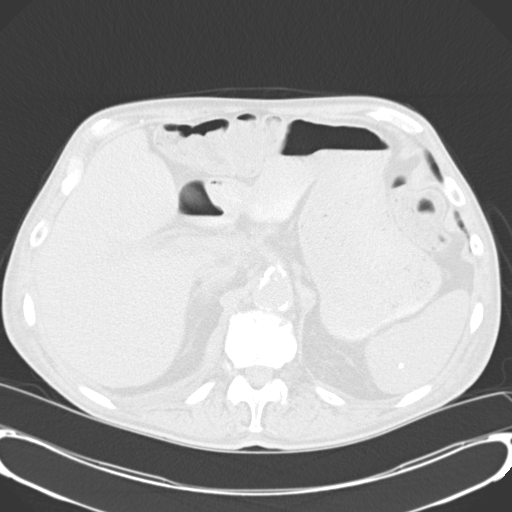
[im 16/69  lung]
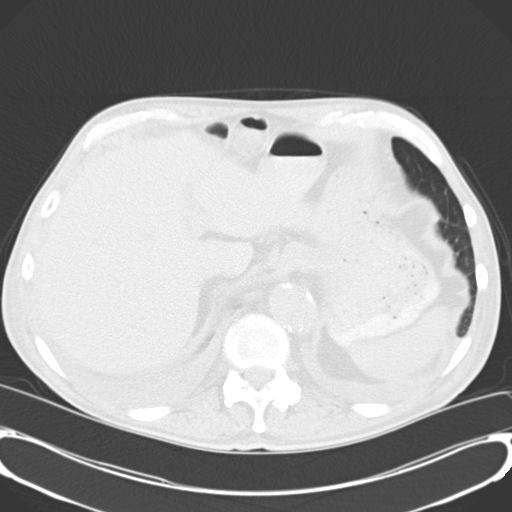
[im 21/69  lung]
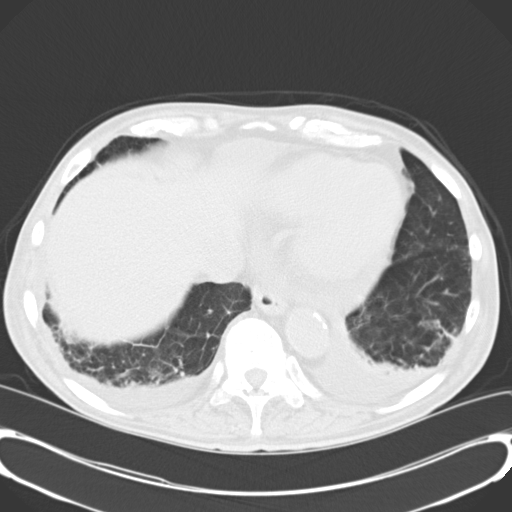
[im 26/69  mediastinal]
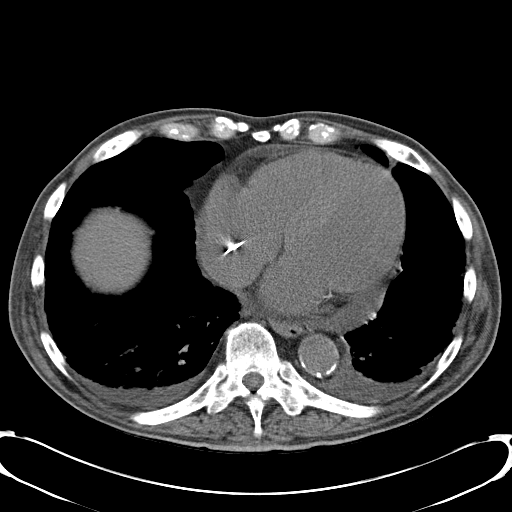
[im 26/69  lung]
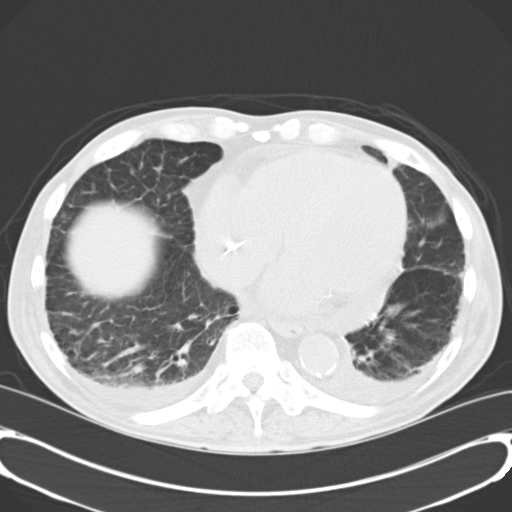
[im 31/69  lung]
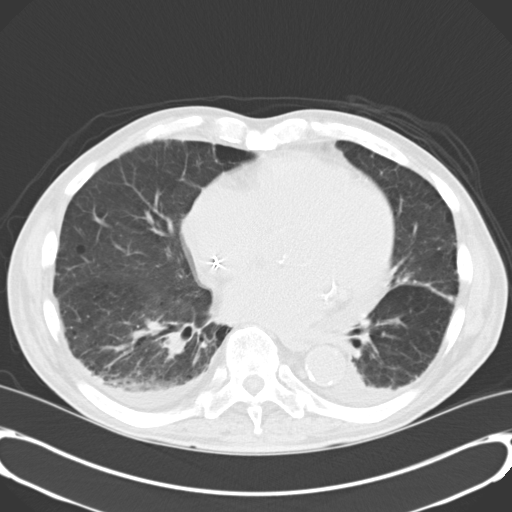
[im 38/69  lung]
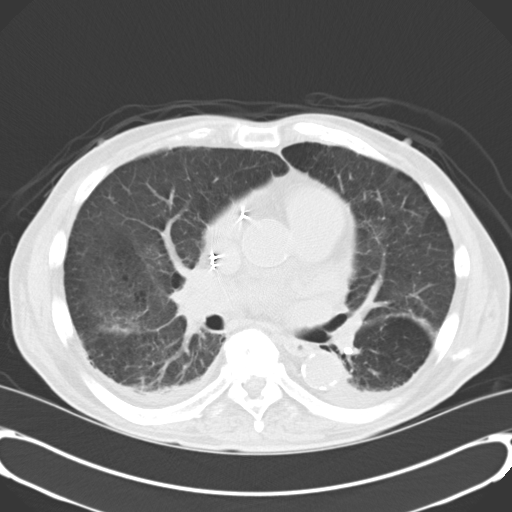
[im 43/69  lung]
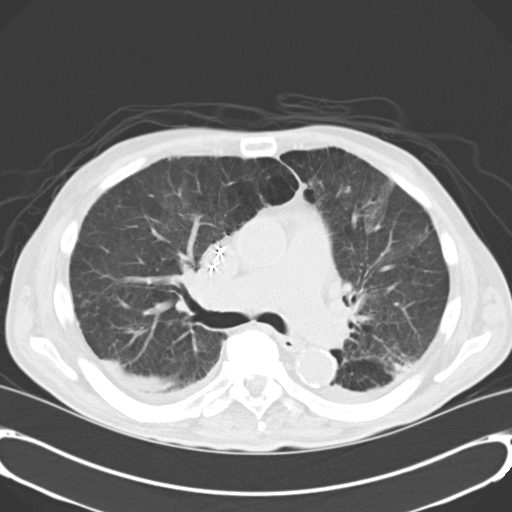
[im 48/69  mediastinal]
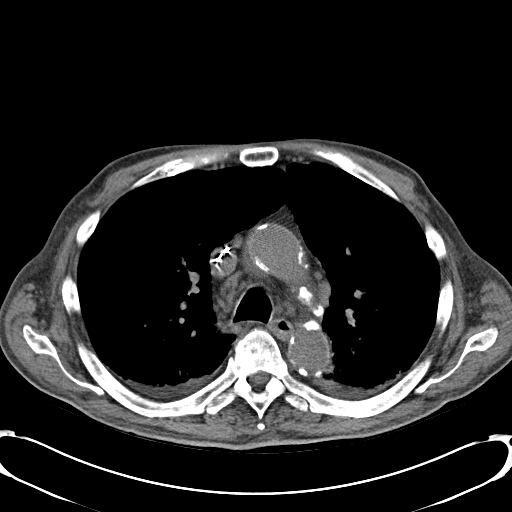
[im 48/69  lung]
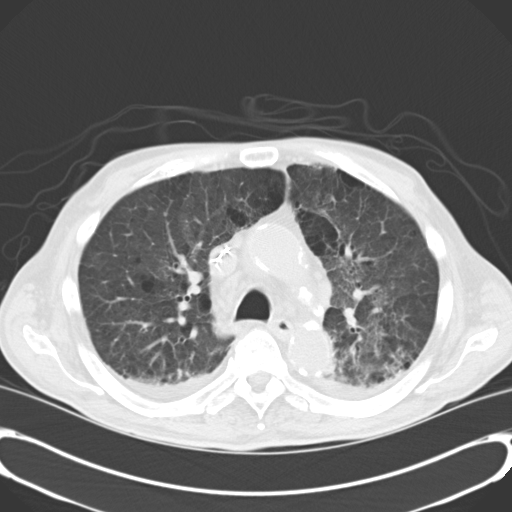
[im 53/69  lung]
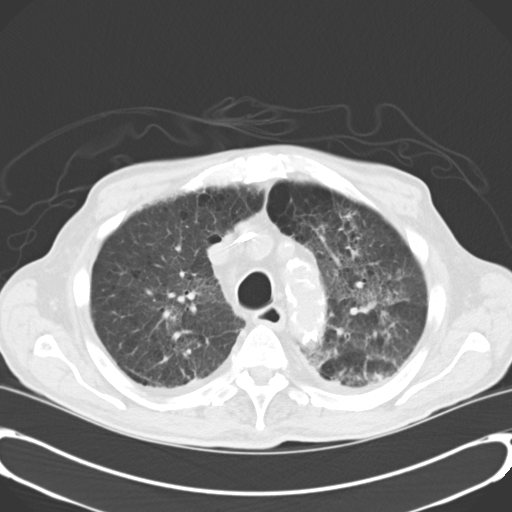
[im 58/69  lung]
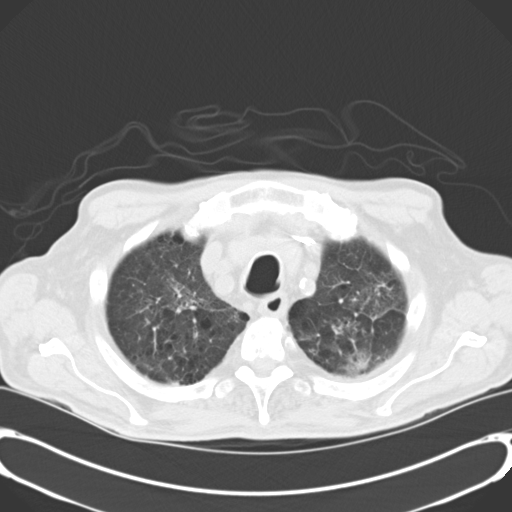
[im 63/69  lung]
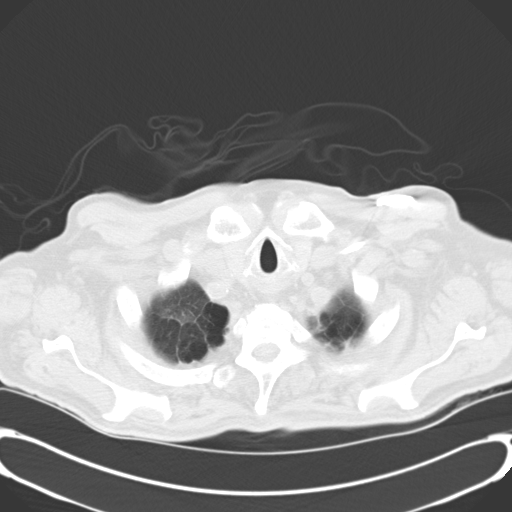

[Series 4: mpr coro 3mm · coronal · 0.69mm/px · 3 of 83 slices shown]
[im 17/83  lung]
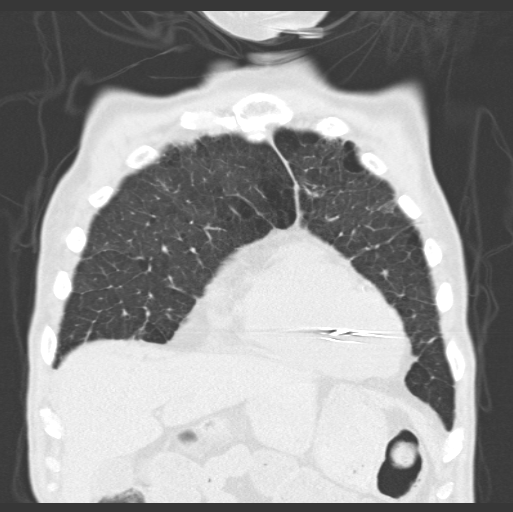
[im 33/83  lung]
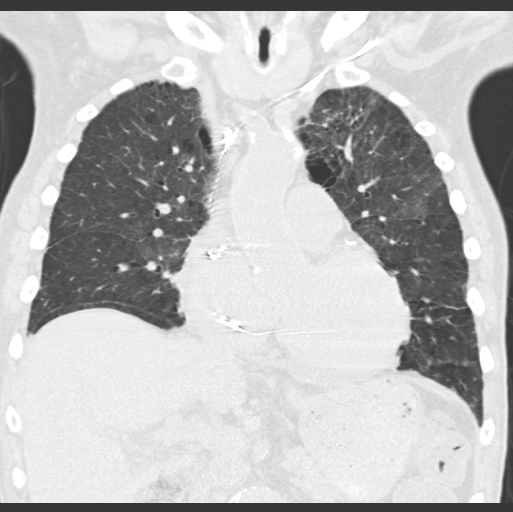
[im 50/83  lung]
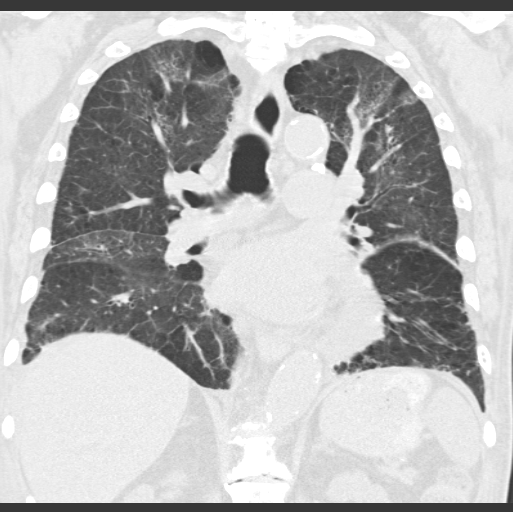

[15 of 36 positions shown; findings below may reference images not displayed]

FINDINGS: Coronary and aortic atherosclerosis with mild cardiomegaly.
Low-density blood pool. Pacer lead positioning unchanged.

Small bilateral pleural effusions, reduced in size compared to
prior. Small to moderate pericardial effusion.

There is evidence of an abdominal aortic aneurysm on the lowest most
images, not completely included but measuring at least 5.7 cm in AP
dimension.

Scattered small mediastinal lymph nodes are present.

Centrilobular emphysema observed with secondary pulmonary lobular
septal accentuation as well as some faint scattered
ground-glass/mosaic attenuation in the lungs. Cylindrical
bronchiectasis in the lower lobes. Old granulomatous disease noted.
IMPRESSION: 1. Centrilobular emphysema with a suggestion of mild superimposed
interstitial pulmonary edema, and small bilateral pleural effusions.
2. Moderate pericardial effusion with mild cardiomegaly. Low-density
blood pool suggests anemia.
3. Partial visualization of an abdominal aortic aneurysm - if not
recently worked up think consider abdominal ultrasound or CT.

## 2016-04-27 ENCOUNTER — Other Ambulatory Visit: Payer: Self-pay | Admitting: Nurse Practitioner
